# Patient Record
Sex: Female | Born: 1950 | Race: White | Hispanic: No | Smoking: Former smoker
Health system: Southern US, Community
[De-identification: ages and names within clinical notes are randomized; demographics above are authoritative.]

## PROBLEM LIST (undated history)

## (undated) DIAGNOSIS — E079 Disorder of thyroid, unspecified: Secondary | ICD-10-CM

## (undated) DIAGNOSIS — F329 Major depressive disorder, single episode, unspecified: Secondary | ICD-10-CM

## (undated) DIAGNOSIS — I10 Essential (primary) hypertension: Secondary | ICD-10-CM

## (undated) DIAGNOSIS — K219 Gastro-esophageal reflux disease without esophagitis: Secondary | ICD-10-CM

## (undated) DIAGNOSIS — E119 Type 2 diabetes mellitus without complications: Secondary | ICD-10-CM

## (undated) DIAGNOSIS — T7840XA Allergy, unspecified, initial encounter: Secondary | ICD-10-CM

## (undated) DIAGNOSIS — C801 Malignant (primary) neoplasm, unspecified: Secondary | ICD-10-CM

## (undated) DIAGNOSIS — C50919 Malignant neoplasm of unspecified site of unspecified female breast: Secondary | ICD-10-CM

## (undated) DIAGNOSIS — M1990 Unspecified osteoarthritis, unspecified site: Secondary | ICD-10-CM

## (undated) DIAGNOSIS — M810 Age-related osteoporosis without current pathological fracture: Secondary | ICD-10-CM

## (undated) DIAGNOSIS — Z8619 Personal history of other infectious and parasitic diseases: Secondary | ICD-10-CM

## (undated) DIAGNOSIS — E785 Hyperlipidemia, unspecified: Secondary | ICD-10-CM

## (undated) DIAGNOSIS — F32A Depression, unspecified: Secondary | ICD-10-CM

## (undated) DIAGNOSIS — N390 Urinary tract infection, site not specified: Secondary | ICD-10-CM

## (undated) DIAGNOSIS — I447 Left bundle-branch block, unspecified: Secondary | ICD-10-CM

## (undated) DIAGNOSIS — N189 Chronic kidney disease, unspecified: Secondary | ICD-10-CM

## (undated) HISTORY — DX: Disorder of thyroid, unspecified: E07.9

## (undated) HISTORY — DX: Type 2 diabetes mellitus without complications: E11.9

## (undated) HISTORY — DX: Major depressive disorder, single episode, unspecified: F32.9

## (undated) HISTORY — DX: Essential (primary) hypertension: I10

## (undated) HISTORY — PX: APPENDECTOMY: SHX54

## (undated) HISTORY — DX: Gastro-esophageal reflux disease without esophagitis: K21.9

## (undated) HISTORY — DX: Personal history of other infectious and parasitic diseases: Z86.19

## (undated) HISTORY — DX: Hyperlipidemia, unspecified: E78.5

## (undated) HISTORY — DX: Unspecified osteoarthritis, unspecified site: M19.90

## (undated) HISTORY — DX: Left bundle-branch block, unspecified: I44.7

## (undated) HISTORY — DX: Allergy, unspecified, initial encounter: T78.40XA

## (undated) HISTORY — DX: Depression, unspecified: F32.A

## (undated) HISTORY — PX: BREAST BIOPSY: SHX20

## (undated) HISTORY — DX: Urinary tract infection, site not specified: N39.0

## (undated) HISTORY — PX: ABDOMINAL HYSTERECTOMY: SHX81

---

## 2014-11-01 DIAGNOSIS — Z23 Encounter for immunization: Secondary | ICD-10-CM | POA: Diagnosis not present

## 2014-12-06 DIAGNOSIS — E119 Type 2 diabetes mellitus without complications: Secondary | ICD-10-CM | POA: Diagnosis not present

## 2014-12-06 DIAGNOSIS — N289 Disorder of kidney and ureter, unspecified: Secondary | ICD-10-CM | POA: Diagnosis not present

## 2014-12-06 DIAGNOSIS — E039 Hypothyroidism, unspecified: Secondary | ICD-10-CM | POA: Diagnosis not present

## 2014-12-08 DIAGNOSIS — R5383 Other fatigue: Secondary | ICD-10-CM | POA: Diagnosis not present

## 2014-12-08 DIAGNOSIS — E119 Type 2 diabetes mellitus without complications: Secondary | ICD-10-CM | POA: Diagnosis not present

## 2015-03-08 DIAGNOSIS — E039 Hypothyroidism, unspecified: Secondary | ICD-10-CM | POA: Diagnosis not present

## 2015-03-08 DIAGNOSIS — R5383 Other fatigue: Secondary | ICD-10-CM | POA: Diagnosis not present

## 2015-03-08 DIAGNOSIS — I10 Essential (primary) hypertension: Secondary | ICD-10-CM | POA: Diagnosis not present

## 2015-03-08 DIAGNOSIS — E119 Type 2 diabetes mellitus without complications: Secondary | ICD-10-CM | POA: Diagnosis not present

## 2015-03-08 DIAGNOSIS — E785 Hyperlipidemia, unspecified: Secondary | ICD-10-CM | POA: Diagnosis not present

## 2015-03-10 DIAGNOSIS — I10 Essential (primary) hypertension: Secondary | ICD-10-CM | POA: Diagnosis not present

## 2015-03-10 DIAGNOSIS — E119 Type 2 diabetes mellitus without complications: Secondary | ICD-10-CM | POA: Diagnosis not present

## 2015-03-10 DIAGNOSIS — Z1231 Encounter for screening mammogram for malignant neoplasm of breast: Secondary | ICD-10-CM | POA: Diagnosis not present

## 2015-03-11 DIAGNOSIS — N289 Disorder of kidney and ureter, unspecified: Secondary | ICD-10-CM | POA: Diagnosis not present

## 2015-03-11 DIAGNOSIS — M545 Low back pain: Secondary | ICD-10-CM | POA: Diagnosis not present

## 2015-03-24 DIAGNOSIS — D128 Benign neoplasm of rectum: Secondary | ICD-10-CM | POA: Diagnosis not present

## 2015-03-24 DIAGNOSIS — K635 Polyp of colon: Secondary | ICD-10-CM | POA: Diagnosis not present

## 2015-03-24 DIAGNOSIS — K621 Rectal polyp: Secondary | ICD-10-CM | POA: Diagnosis not present

## 2015-03-24 DIAGNOSIS — D123 Benign neoplasm of transverse colon: Secondary | ICD-10-CM | POA: Diagnosis not present

## 2015-06-09 DIAGNOSIS — E039 Hypothyroidism, unspecified: Secondary | ICD-10-CM | POA: Diagnosis not present

## 2015-06-09 DIAGNOSIS — E119 Type 2 diabetes mellitus without complications: Secondary | ICD-10-CM | POA: Diagnosis not present

## 2015-06-09 DIAGNOSIS — N289 Disorder of kidney and ureter, unspecified: Secondary | ICD-10-CM | POA: Diagnosis not present

## 2015-06-10 DIAGNOSIS — I10 Essential (primary) hypertension: Secondary | ICD-10-CM | POA: Diagnosis not present

## 2015-09-17 DIAGNOSIS — Z23 Encounter for immunization: Secondary | ICD-10-CM | POA: Diagnosis not present

## 2016-01-12 ENCOUNTER — Encounter: Payer: Self-pay | Admitting: Family Medicine

## 2016-01-12 ENCOUNTER — Ambulatory Visit (INDEPENDENT_AMBULATORY_CARE_PROVIDER_SITE_OTHER): Payer: Medicare Other | Admitting: Family Medicine

## 2016-01-12 ENCOUNTER — Other Ambulatory Visit: Payer: Self-pay | Admitting: *Deleted

## 2016-01-12 VITALS — BP 130/70 | HR 86 | Temp 98.8°F | Ht 62.5 in | Wt 151.0 lb

## 2016-01-12 DIAGNOSIS — E1169 Type 2 diabetes mellitus with other specified complication: Secondary | ICD-10-CM | POA: Insufficient documentation

## 2016-01-12 DIAGNOSIS — I10 Essential (primary) hypertension: Secondary | ICD-10-CM | POA: Diagnosis not present

## 2016-01-12 DIAGNOSIS — K219 Gastro-esophageal reflux disease without esophagitis: Secondary | ICD-10-CM | POA: Insufficient documentation

## 2016-01-12 DIAGNOSIS — G4700 Insomnia, unspecified: Secondary | ICD-10-CM

## 2016-01-12 DIAGNOSIS — R159 Full incontinence of feces: Secondary | ICD-10-CM | POA: Insufficient documentation

## 2016-01-12 DIAGNOSIS — E119 Type 2 diabetes mellitus without complications: Secondary | ICD-10-CM | POA: Diagnosis not present

## 2016-01-12 DIAGNOSIS — F321 Major depressive disorder, single episode, moderate: Secondary | ICD-10-CM

## 2016-01-12 DIAGNOSIS — E039 Hypothyroidism, unspecified: Secondary | ICD-10-CM | POA: Diagnosis not present

## 2016-01-12 DIAGNOSIS — I152 Hypertension secondary to endocrine disorders: Secondary | ICD-10-CM | POA: Insufficient documentation

## 2016-01-12 DIAGNOSIS — E1121 Type 2 diabetes mellitus with diabetic nephropathy: Secondary | ICD-10-CM | POA: Insufficient documentation

## 2016-01-12 DIAGNOSIS — J309 Allergic rhinitis, unspecified: Secondary | ICD-10-CM | POA: Insufficient documentation

## 2016-01-12 DIAGNOSIS — E785 Hyperlipidemia, unspecified: Secondary | ICD-10-CM | POA: Insufficient documentation

## 2016-01-12 DIAGNOSIS — F5104 Psychophysiologic insomnia: Secondary | ICD-10-CM

## 2016-01-12 DIAGNOSIS — E7800 Pure hypercholesterolemia, unspecified: Secondary | ICD-10-CM

## 2016-01-12 DIAGNOSIS — E1159 Type 2 diabetes mellitus with other circulatory complications: Secondary | ICD-10-CM | POA: Insufficient documentation

## 2016-01-12 LAB — HM DIABETES FOOT EXAM

## 2016-01-12 NOTE — Assessment & Plan Note (Signed)
Well controlled on 1/2 tab of trazodone at bedtime.

## 2016-01-12 NOTE — Patient Instructions (Addendum)
Make sure you have a yearly eye exam.  Return for fasting labs in next few week.  Start benefiber daily, increase slowly.  Avoid greasy foods.  Increase water in diet. Can start back on probiotic.  When back from trip. Try 1 week off metformin if issue not resolved. If stool issue better call for medicaiton change. Restart metformin if not any better.

## 2016-01-12 NOTE — Assessment & Plan Note (Signed)
Stable control on flonase and zyrtec.

## 2016-01-12 NOTE — Assessment & Plan Note (Signed)
Stable control on current levo dose.

## 2016-01-12 NOTE — Assessment & Plan Note (Signed)
Stable with omeprazole 20 mg daily. Can try to wean off.

## 2016-01-12 NOTE — Progress Notes (Signed)
Subjective:    Patient ID: April Ross, female    DOB: [birth year], 65 y.o.   MRN: [id number]  HPI   65 year old female presents to establish care.  She recently moved from [facility]. Dr. Janalyn Rouse. Last OV  06/2015.  Last CPX 03/2015  She has been having sudden urges to defecate ongoing x 1-1.5 years, worse in last several months. Occ diarrhea, but softer usually.  Almost incontinence. A lot of bloating and gas, no blood in stool.   Diabetes: Due for recheck. Well controlled last check A1C 6.4 06/2015,  On metformin in last year. Using medications without difficulties: Hypoglycemic episodes:3-4 times in last year. Hyperglycemic episodes:none Feet problems:no uclers Blood Sugars averaging:occ checking, FBS 117-135 eye exam within last year: due   Elevated Cholesterol: Due for re-eval on crestor. Using medications without problems:None Muscle aches: None Diet compliance: moderate Exercise:none Other complaints:  Hypothyroid: Well controlled on levothyroxine.Last TSH 0.11 in 06/2015   Hypertension:    Well controlled on benazapril,  BP Readings from Last 3 Encounters:  01/12/16 130/70  Using medication without problems or lightheadedness: None Chest pain with exertion:None Edema:None Short of breath:None Average home BPs:120/70 Other issues:  Allergic rhinitis: Stale control on flonase and zyrtec.  Moderate depression, stable control. Fluoxetine and trazodone at night. Still some stressors. Husband  passed away with  Esophageal cancer 2008, trying to sell house.  Will be due for Medicare wellness in 03/2015 Colonoscpy 03/2015, repeta due in 10 years Mammo nml 03/2015  TAH, no pap, no DVE required.   Social History /Family History/Past Medical History reviewed and updated if needed. Review of Systems  Constitutional: Negative for fever and fatigue.  HENT: Negative for congestion and ear pain.   Eyes: Negative for pain.  Respiratory: Negative for cough,  chest tightness and shortness of breath.   Cardiovascular: Negative for chest pain, palpitations and leg swelling.  Gastrointestinal: Positive for nausea and diarrhea. Negative for abdominal pain and blood in stool.  Genitourinary: Negative for dysuria and vaginal bleeding.  Musculoskeletal: Positive for back pain.  Neurological: Negative for syncope, light-headedness and headaches.  Psychiatric/Behavioral: Negative for dysphoric mood.       Objective:   Physical Exam  Constitutional: Vital signs are normal. She appears well-developed and well-nourished. She is cooperative.  Non-toxic appearance. She does not appear ill. No distress.  HENT:  Head: Normocephalic.  Right Ear: Hearing, tympanic membrane, external ear and ear canal normal.  Left Ear: Hearing, tympanic membrane, external ear and ear canal normal.  Nose: Nose normal.  Eyes: Conjunctivae, EOM and lids are normal. Pupils are equal, round, and reactive to light. Lids are everted and swept, no foreign bodies found.  Neck: Trachea normal and normal range of motion. Neck supple. Carotid bruit is not present. No thyroid mass and no thyromegaly present.  Cardiovascular: Normal rate, regular rhythm, S1 normal, S2 normal, normal heart sounds and intact distal pulses.  Exam reveals no gallop.   No murmur heard. Pulmonary/Chest: Effort normal and breath sounds normal. No respiratory distress. She has no wheezes. She has no rhonchi. She has no rales.  Abdominal: Soft. Normal appearance and bowel sounds are normal. She exhibits no distension, no fluid wave, no abdominal bruit and no mass. There is no hepatosplenomegaly. There is no tenderness. There is no rebound, no guarding and no CVA tenderness. No hernia.  Lymphadenopathy:    She has no cervical adenopathy.    She has no axillary adenopathy.  Neurological:  She is alert. She has normal strength. No cranial nerve deficit or sensory deficit.  Skin: Skin is warm, dry and intact. No Flinchum  noted.  Psychiatric: Her speech is normal and behavior is normal. Judgment normal. Her mood appears not anxious. Cognition and memory are normal. She does not exhibit a depressed mood.      Diabetic foot exam: Normal inspection No skin breakdown No calluses  Normal DP pulses Normal sensation to light touch and monofilament Nails normal     Assessment & Plan:

## 2016-01-12 NOTE — Assessment & Plan Note (Signed)
Stable control on fluoxetine 

## 2016-01-12 NOTE — Assessment & Plan Note (Signed)
Due for re-eval. 

## 2016-01-12 NOTE — Progress Notes (Signed)
Pre visit review using our clinic review tool, if applicable. No additional management support is needed unless otherwise documented below in the visit note. 

## 2016-01-12 NOTE — Assessment & Plan Note (Signed)
Due for re-eval. ? If metformin causing diarrhea.

## 2016-01-12 NOTE — Assessment & Plan Note (Signed)
Well controlled. Continue current medication.  

## 2016-01-12 NOTE — Assessment & Plan Note (Signed)
IBS versus SE to metformin.  Trial off. Try stress reduction, avoid greasy foods, increase fiber and add probiotic.

## 2016-01-26 ENCOUNTER — Other Ambulatory Visit (INDEPENDENT_AMBULATORY_CARE_PROVIDER_SITE_OTHER): Payer: Medicare Other

## 2016-01-26 ENCOUNTER — Telehealth: Payer: Self-pay | Admitting: Family Medicine

## 2016-01-26 DIAGNOSIS — E119 Type 2 diabetes mellitus without complications: Secondary | ICD-10-CM

## 2016-01-26 DIAGNOSIS — E7800 Pure hypercholesterolemia, unspecified: Secondary | ICD-10-CM | POA: Diagnosis not present

## 2016-01-26 DIAGNOSIS — E039 Hypothyroidism, unspecified: Secondary | ICD-10-CM

## 2016-01-26 LAB — LIPID PANEL
CHOLESTEROL: 215 mg/dL — AB (ref 0–200)
HDL: 34.1 mg/dL — AB (ref >39.00)
NonHDL: 180.48
Total CHOL/HDL Ratio: 6
Triglycerides: 316 mg/dL — ABNORMAL HIGH (ref 0.0–149.0)
VLDL: 63.2 mg/dL — ABNORMAL HIGH (ref 0.0–40.0)

## 2016-01-26 LAB — COMPREHENSIVE METABOLIC PANEL
ALBUMIN: 4.7 g/dL (ref 3.5–5.2)
ALK PHOS: 68 U/L (ref 39–117)
ALT: 13 U/L (ref 0–35)
AST: 16 U/L (ref 0–37)
BILIRUBIN TOTAL: 0.5 mg/dL (ref 0.2–1.2)
BUN: 22 mg/dL (ref 6–23)
CO2: 28 mEq/L (ref 19–32)
Calcium: 10.1 mg/dL (ref 8.4–10.5)
Chloride: 102 mEq/L (ref 96–112)
Creatinine, Ser: 1.5 mg/dL — ABNORMAL HIGH (ref 0.40–1.20)
GFR: 36.82 mL/min — AB (ref >60.00)
GLUCOSE: 139 mg/dL — AB (ref 70–99)
Potassium: 4.6 mEq/L (ref 3.5–5.1)
Sodium: 138 mEq/L (ref 135–145)
TOTAL PROTEIN: 7.3 g/dL (ref 6.0–8.3)

## 2016-01-26 LAB — HEMOGLOBIN A1C: HEMOGLOBIN A1C: 6.6 % — AB (ref 4.6–6.5)

## 2016-01-26 LAB — LDL CHOLESTEROL, DIRECT: LDL DIRECT: 143 mg/dL

## 2016-01-26 NOTE — Telephone Encounter (Signed)
-----   Message from Ellamae Sia sent at 01/26/2016  9:03 AM EDT ----- Regarding: Lab orders asap, Thanks Lab orders, no f/u appt

## 2016-01-27 ENCOUNTER — Other Ambulatory Visit: Payer: Self-pay

## 2016-01-27 MED ORDER — FLUOXETINE HCL 20 MG PO CAPS: 20.0000 mg | ORAL_CAPSULE | Freq: Every day | ORAL | Status: DC

## 2016-01-27 MED ORDER — OMEPRAZOLE 20 MG PO CPDR
20.0000 mg | DELAYED_RELEASE_CAPSULE | Freq: Every day | ORAL | Status: DC
Start: 2016-01-27 — End: 2016-05-23

## 2016-01-27 MED ORDER — BENAZEPRIL HCL 20 MG PO TABS: 20.0000 mg | ORAL_TABLET | Freq: Every day | ORAL | Status: DC

## 2016-01-27 NOTE — Telephone Encounter (Signed)
Pt left note requesting refill benazepril,fluoxetine,omeprazole, crestor and tramadol to [facility]. Pt established care on 01/12/16; pt scheduled for medicare wellness with Dr Diona Browner on 05/08/16. Lipid labs are in pts chart. Benazepril, fluoxetine and omeprazole refilled per protocol. Crestor(time for lipid to be eval per 01/12/16 note) and tramadol refill request sent to Dr Diona Browner.

## 2016-01-28 MED ORDER — ROSUVASTATIN CALCIUM 10 MG PO TABS: 10.0000 mg | ORAL_TABLET | Freq: Every day | ORAL | Status: DC

## 2016-01-28 MED ORDER — TRAMADOL HCL 50 MG PO TABS: 50.0000 mg | ORAL_TABLET | ORAL | Status: DC | PRN

## 2016-01-31 ENCOUNTER — Telehealth: Payer: Self-pay | Admitting: Family Medicine

## 2016-01-31 MED ORDER — ROSUVASTATIN CALCIUM 20 MG PO TABS: 20.0000 mg | ORAL_TABLET | Freq: Every day | ORAL | Status: DC

## 2016-01-31 NOTE — Telephone Encounter (Signed)
Lab results discussed with April Ross via telephone. (See result note from 01/26/16 labs).  She is willing to increase her Crestor to 20 mg daily.  [facility] [place]  She states the week prior to having her labs drawn they had been at [facility] doing a lot of walking and don't drinking a lot of water so she was probably a little dehydrated

## 2016-01-31 NOTE — Telephone Encounter (Signed)
Tramadol called into [facility].

## 2016-01-31 NOTE — Telephone Encounter (Signed)
Patient returned Donna's phone call.  Patient's out of town. Call her back on her cell phone.

## 2016-02-02 ENCOUNTER — Other Ambulatory Visit: Payer: Self-pay | Admitting: *Deleted

## 2016-02-02 NOTE — Telephone Encounter (Signed)
Last office visit 01/12/2016.  Ok to refill?

## 2016-02-03 MED ORDER — TRAZODONE HCL 100 MG PO TABS: 100.0000 mg | ORAL_TABLET | Freq: Every day | ORAL | Status: DC

## 2016-02-17 ENCOUNTER — Telehealth: Payer: Self-pay | Admitting: Family Medicine

## 2016-02-17 DIAGNOSIS — L282 Other prurigo: Secondary | ICD-10-CM | POA: Diagnosis not present

## 2016-02-17 DIAGNOSIS — M79651 Pain in right thigh: Secondary | ICD-10-CM | POA: Diagnosis not present

## 2016-02-17 NOTE — Telephone Encounter (Signed)
I spoke with pt and she is on her way to [facility] in [place] now. Pt will cb next week with update.

## 2016-02-17 NOTE — Telephone Encounter (Signed)
PLEASE NOTE: All timestamps contained within this report are represented as [place] Standard Time. CONFIDENTIALTY NOTICE: This fax transmission is intended only for the addressee. It contains information that is legally privileged, confidential or otherwise protected from use or disclosure. If you are not the intended recipient, you are strictly prohibited from reviewing, disclosing, copying using or disseminating any of this information or taking any action in reliance on or regarding this information. If you have received this fax in error, please notify us immediately by telephone so that we can arrange for its return to [place]. Phone: [phone], Toll-Free: [phone], Fax: [phone] Page: 1 of 2 Call Id: [id number] [place] Patient Name: April Ross Gender: Female DOB: Jun 29, 1951 Age: 65 Y 11 M 24 D Return Phone Number: [phone] (Primary), [phone] (Secondary) Address: City/State/Zip: [place] Client [place] [facility] - Client Client Site [place] - Day Physician Eliezer Lofts - MD Contact Type Call Who Is Calling Patient / Member / Family / Caregiver Call Type Triage / Clinical Relationship To Patient Self Return Phone Number [phone] (Primary) Chief Complaint Tick Bite Reason for Call Symptomatic / Request for [name] states she found a tick on her upper thigh Wednesday morning. Yesterday it became red and today is red, swollen and redness is spreading. Appointment Disposition EMR Appointment Attempted - Not Scheduled Info pasted into Epic Yes PreDisposition Call Doctor Translation No Nurse Assessment Nurse: Luther Parody, RN, Malachy Mood Date/Time (Eastern Time): 02/17/2016 3:12:27 PM Confirm and document reason for call. If symptomatic, describe symptoms. You must click the next button to save  text entered. ---Caller states that she found a tick on her upper thigh on Wed. She removed it w/o any problems and said it came off very easily however today she noticed spreading redness with swelling and warmth to the area. Has the patient traveled out of the country within the last 30 days? ---Not Applicable Does the patient have any new or worsening symptoms? ---Yes Will a triage be completed? ---Yes Related visit to physician within the last 2 weeks? ---N/A Does the PT have any chronic conditions? (i.e. diabetes, asthma, etc.) ---Unknown Is this a behavioral health or substance abuse call? ---No Guidelines Guideline Title Affirmed Question Affirmed Notes Nurse Date/Time (Eastern Time) Tick Bite [1] Red streak or red line AND [2] length > 2 inches (5 cm) Luther Parody, RN, Cheryl 02/17/2016 3:14:37 PM Disp. Time Eilene Ghazi Time) Disposition Final User PLEASE NOTE: All timestamps contained within this report are represented as [place] Standard Time. CONFIDENTIALTY NOTICE: This fax transmission is intended only for the addressee. It contains information that is legally privileged, confidential or otherwise protected from use or disclosure. If you are not the intended recipient, you are strictly prohibited from reviewing, disclosing, copying using or disseminating any of this information or taking any action in reliance on or regarding this information. If you have received this fax in error, please notify us immediately by telephone so that we can arrange for its return to [place]. Phone: [phone], Toll-Free: [phone], Fax: [phone] Page: 2 of 2 Call Id: [id number] 02/17/2016 3:20:12 PM See Physician within 4 Hours (or PCP triage) Yes Luther Parody, RN, Erskine Speed Understands: Yes Disagree/Comply: Comply Care Advice Given Per Guideline SEE PHYSICIAN WITHIN 4 HOURS (or PCP triage): * You become worse. CALL BACK IF: CARE ADVICE given per Tick Bites (Adult) guideline. Referrals GO TO  FACILITY OTHER - SPECIFY

## 2016-02-17 NOTE — Telephone Encounter (Signed)
[facility]     Patient Name: April Ross Initial Comment Caller states she found a tick on her upper thigh Wednesday morning. Yesterday it became red and today is red, swollen and redness is spreading.   DOB: Jun 01, 1951      Nurse Assessment  Nurse: Luther Parody RN, Malachy Mood Date/Time (Eastern Time): 02/17/2016 3:12:27 PM  Confirm and document reason for call. If symptomatic, describe symptoms. You must click the next button to save text entered. ---Caller states that she found a tick on her upper thigh on Wed. She removed it w/o any problems and said it came off very easily however today she noticed spreading redness with swelling and warmth to the area.  Has the patient traveled out of the country within the last 30 days? ---Not Applicable  Does the patient have any new or worsening symptoms? ---Yes  Will a triage be completed? ---Yes  Related visit to physician within the last 2 weeks? ---N/A  Does the PT have any chronic conditions? (i.e. diabetes, asthma, etc.) ---Unknown  Is this a behavioral health or substance abuse call? ---No    Guidelines     Guideline Title Affirmed Question Affirmed Notes   Tick Bite [1] Red streak or red line AND [2] length > 2 inches (5 cm)    Final Disposition User   See Physician within 4 Hours (or PCP triage) Luther Parody, RN, Cheryl     Referrals   GO TO FACILITY OTHER - SPECIFY   Disagree/Comply: Comply

## 2016-04-25 DIAGNOSIS — E119 Type 2 diabetes mellitus without complications: Secondary | ICD-10-CM | POA: Diagnosis not present

## 2016-04-25 LAB — HM DIABETES EYE EXAM

## 2016-05-01 ENCOUNTER — Encounter: Payer: Self-pay | Admitting: Family Medicine

## 2016-05-03 ENCOUNTER — Telehealth: Payer: Self-pay | Admitting: Family Medicine

## 2016-05-03 ENCOUNTER — Other Ambulatory Visit (INDEPENDENT_AMBULATORY_CARE_PROVIDER_SITE_OTHER): Payer: Medicare Other

## 2016-05-03 DIAGNOSIS — Z1159 Encounter for screening for other viral diseases: Secondary | ICD-10-CM

## 2016-05-03 DIAGNOSIS — E119 Type 2 diabetes mellitus without complications: Secondary | ICD-10-CM | POA: Diagnosis not present

## 2016-05-03 DIAGNOSIS — E039 Hypothyroidism, unspecified: Secondary | ICD-10-CM

## 2016-05-03 DIAGNOSIS — E7800 Pure hypercholesterolemia, unspecified: Secondary | ICD-10-CM | POA: Diagnosis not present

## 2016-05-03 LAB — COMPREHENSIVE METABOLIC PANEL
ALBUMIN: 4.8 g/dL (ref 3.5–5.2)
ALK PHOS: 63 U/L (ref 39–117)
ALT: 14 U/L (ref 0–35)
AST: 17 U/L (ref 0–37)
BILIRUBIN TOTAL: 0.5 mg/dL (ref 0.2–1.2)
BUN: 31 mg/dL — AB (ref 6–23)
CO2: 27 mEq/L (ref 19–32)
CREATININE: 1.5 mg/dL — AB (ref 0.40–1.20)
Calcium: 10.5 mg/dL (ref 8.4–10.5)
Chloride: 99 mEq/L (ref 96–112)
GFR: 36.79 mL/min — ABNORMAL LOW (ref >60.00)
GLUCOSE: 135 mg/dL — AB (ref 70–99)
Potassium: 4.2 mEq/L (ref 3.5–5.1)
SODIUM: 135 meq/L (ref 135–145)
TOTAL PROTEIN: 7.5 g/dL (ref 6.0–8.3)

## 2016-05-03 LAB — T3, FREE: T3 FREE: 3.2 pg/mL (ref 2.3–4.2)

## 2016-05-03 LAB — HEMOGLOBIN A1C: Hgb A1c MFr Bld: 6.6 % — ABNORMAL HIGH (ref 4.6–6.5)

## 2016-05-03 LAB — T4, FREE: FREE T4: 1.43 ng/dL (ref 0.60–1.60)

## 2016-05-03 LAB — LIPID PANEL
CHOL/HDL RATIO: 7
CHOLESTEROL: 205 mg/dL — AB (ref 0–200)
HDL: 30.2 mg/dL — ABNORMAL LOW (ref >39.00)
Triglycerides: 459 mg/dL — ABNORMAL HIGH (ref 0.0–149.0)

## 2016-05-03 LAB — LDL CHOLESTEROL, DIRECT: LDL DIRECT: 112 mg/dL

## 2016-05-03 LAB — TSH: TSH: 0.29 u[IU]/mL — ABNORMAL LOW (ref 0.35–4.50)

## 2016-05-03 NOTE — Telephone Encounter (Signed)
-----   Message from Marchia Bond sent at 05/03/2016  9:05 AM EDT ----- Regarding: Pt walked in for cpx labs this am, need orders. Thanks!! Please order  future cpx labs for pt's upcoming lab appt. Thanks Aniceto Boss

## 2016-05-04 LAB — HEPATITIS C ANTIBODY: HCV Ab: NEGATIVE

## 2016-05-08 ENCOUNTER — Ambulatory Visit (INDEPENDENT_AMBULATORY_CARE_PROVIDER_SITE_OTHER): Payer: Medicare Other | Admitting: Family Medicine

## 2016-05-08 ENCOUNTER — Encounter: Payer: Self-pay | Admitting: Family Medicine

## 2016-05-08 VITALS — BP 113/65 | HR 62 | Temp 98.4°F | Ht 62.5 in | Wt 148.5 lb

## 2016-05-08 DIAGNOSIS — E039 Hypothyroidism, unspecified: Secondary | ICD-10-CM

## 2016-05-08 DIAGNOSIS — R159 Full incontinence of feces: Secondary | ICD-10-CM

## 2016-05-08 DIAGNOSIS — N183 Chronic kidney disease, stage 3 unspecified: Secondary | ICD-10-CM | POA: Insufficient documentation

## 2016-05-08 DIAGNOSIS — Z0000 Encounter for general adult medical examination without abnormal findings: Secondary | ICD-10-CM | POA: Diagnosis not present

## 2016-05-08 DIAGNOSIS — E7800 Pure hypercholesterolemia, unspecified: Secondary | ICD-10-CM | POA: Diagnosis not present

## 2016-05-08 DIAGNOSIS — F321 Major depressive disorder, single episode, moderate: Secondary | ICD-10-CM

## 2016-05-08 DIAGNOSIS — Z23 Encounter for immunization: Secondary | ICD-10-CM | POA: Diagnosis not present

## 2016-05-08 DIAGNOSIS — Z1231 Encounter for screening mammogram for malignant neoplasm of breast: Secondary | ICD-10-CM

## 2016-05-08 DIAGNOSIS — E1121 Type 2 diabetes mellitus with diabetic nephropathy: Secondary | ICD-10-CM

## 2016-05-08 DIAGNOSIS — I10 Essential (primary) hypertension: Secondary | ICD-10-CM

## 2016-05-08 DIAGNOSIS — E2839 Other primary ovarian failure: Secondary | ICD-10-CM

## 2016-05-08 LAB — HM DIABETES FOOT EXAM

## 2016-05-08 MED ORDER — PNEUMOCOCCAL VAC POLYVALENT 25 MCG/0.5ML IJ INJ
0.5000 mL | INJECTION | Freq: Once | INTRAMUSCULAR | Status: AC
  Administered 2016-05-08: 0.5 mL via INTRAMUSCULAR

## 2016-05-08 NOTE — Assessment & Plan Note (Signed)
Well controlled. Continue current medication.  

## 2016-05-08 NOTE — Assessment & Plan Note (Signed)
Excellent control on metformin but metformin may be causing diarrhea. If not improving, consider changing to victoza, glipizide etc.

## 2016-05-08 NOTE — Assessment & Plan Note (Signed)
Improved, but not at goal. Work on low carb diet, continue crestor.If not at goal in 3 months, consider adding zetia.

## 2016-05-08 NOTE — Progress Notes (Signed)
Pre visit review using our clinic review tool, if applicable. No additional management support is needed unless otherwise documented below in the visit note. 

## 2016-05-08 NOTE — Assessment & Plan Note (Signed)
Most consistent with IBS.  No improvement with decreasing greasy foods and stress.  trial of water and fiber supplement, gradually increase.  if still no improvement.. Will try to replace metformin.

## 2016-05-08 NOTE — Assessment & Plan Note (Addendum)
Stable control. On ACEI.

## 2016-05-08 NOTE — Progress Notes (Signed)
I have personally reviewed the Medicare Annual Wellness questionnaire and have noted 1. The patient's medical and social history 2. Their use of alcohol, tobacco or illicit drugs 3. Their current medications and supplements 4. The patient's functional ability including ADL's, fall risks, home safety risks and hearing or visual             impairment. 5. Diet and physical activities 6. Evidence for depression or mood disorders 7.         Updated provider list Cognitive evaluation was performed and recorded on pt medicare questionnaire form. The patients weight, height, BMI and visual acuity have been recorded in the chart  I have made referrals, counseling and provided education to the patient based review of the above and I have provided the pt with a written personalized care plan for preventive services.   Continued  stool incontinence and  acute explosive diarrhea: Very watery , no blood. Occ abd cramping, after BM she feels better. ongoing x 1 year.  Has not improved with decrease greasy foods. No improvement with probiotic. Nml colonoscopy 2016.  Diabetes:  Well controlled on metformin.   On metformin in last year. Lab Results  Component Value Date   HGBA1C 6.6 (H) 05/03/2016  Using medications without difficulties: Hypoglycemic episodes:none Hyperglycemic episodes:none Feet problems:no uclers Blood Sugars averaging:occ checking, FBS 70-188 eye exam within last year: 7/17  She has been doing aggressive low carb diet.   Elevated Cholesterol:  Improved, LDL not at goal < 100 on crestor 20 mg daily Lab Results  Component Value Date   CHOL 205 (H) 05/03/2016   HDL 30.20 (L) 05/03/2016   LDLDIRECT 112.0 05/03/2016   TRIG (H) 05/03/2016    459.0 Triglyceride is over 400; calculations on Lipids are invalid.   CHOLHDL 7 05/03/2016  Using medications without problems:None Muscle aches: None Diet compliance: moderate Exercise:none Other complaints:  Hypothyroid: Well  controlled on levothyroxine.  Hypertension:              Well controlled on benazapril,  BP Readings from Last 3 Encounters:  05/08/16 113/65  01/12/16 130/70  Using medication without problems or lightheadedness: None Chest pain with exertion:None Edema:None Short of breath:None Average home BPs:120/70 Other issues:  Allergic rhinitis: Stale control on flonase and zyrtec.  Moderate depression, stable control. Fluoxetine and trazodone at night. Still some stressors. Husband  passed away with  Esophageal cancer 2008, trying to sell house.  CKD: stable   Social History /Family History/Past Medical History reviewed and updated if needed.  Review of Systems  Constitutional: Negative for fever and fatigue.  HENT: Negative for congestion and ear pain.   Eyes: Negative for pain.  Respiratory: Negative for cough, chest tightness and shortness of breath.   Cardiovascular: Negative for chest pain, palpitations and leg swelling.  Gastrointestinal: Positive for nausea and diarrhea. Negative for abdominal pain and blood in stool.  Genitourinary: Negative for dysuria and vaginal bleeding.  Musculoskeletal: Positive for back pain.  Neurological: Negative for syncope, light-headedness and headaches.  Psychiatric/Behavioral: Negative for dysphoric mood.       Objective:   Physical Exam  Constitutional: Vital signs are normal. She appears well-developed and well-nourished. She is cooperative.  Non-toxic appearance. She does not appear ill. No distress.  HENT:  Head: Normocephalic.  Right Ear: Hearing, tympanic membrane, external ear and ear canal normal.  Left Ear: Hearing, tympanic membrane, external ear and ear canal normal.  Nose: Nose normal.  Eyes: Conjunctivae, EOM and lids are normal.  Pupils are equal, round, and reactive to light. Lids are everted and swept, no foreign bodies found.  Neck: Trachea normal and normal range of motion. Neck supple. Carotid bruit is not present.  No thyroid mass and no thyromegaly present.  Cardiovascular: Normal rate, regular rhythm, S1 normal, S2 normal, normal heart sounds and intact distal pulses.  Exam reveals no gallop.   No murmur heard. Pulmonary/Chest: Effort normal and breath sounds normal. No respiratory distress. She has no wheezes. She has no rhonchi. She has no rales.  Abdominal: Soft. Normal appearance and bowel sounds are normal. She exhibits no distension, no fluid wave, no abdominal bruit and no mass. There is no hepatosplenomegaly. There is no tenderness. There is no rebound, no guarding and no CVA tenderness. No hernia.  Lymphadenopathy:    She has no cervical adenopathy.    She has no axillary adenopathy.  Neurological: She is alert. She has normal strength. No cranial nerve deficit or sensory deficit.  Skin: Skin is warm, dry and intact. No Ding noted.  Psychiatric: Her speech is normal and behavior is normal. Judgment normal. Her mood appears not anxious. Cognition and memory are normal. She does not exhibit a depressed mood.     Diabetic foot exam: Normal inspection No skin breakdown No calluses  Normal DP pulses Normal sensation to light touch and monofilament Nails normal  ASSESSMENT AND PLAN:  The patient's preventative maintenance and recommended screening tests for an annual wellness exam were reviewed in full today. Brought up to date unless services declined.  Counselled on the importance of diet, exercise, and its role in overall health and mortality. The patient's FH and SH was reviewed, including their home life, tobacco status, and drug and alcohol status.   Colonoscpy 03/2015, repeat due in 10 years Mammo nml 03/2015, due  TAH, no pap, no DVE required. Nonsmoker, Quit recently! 10 pack year history   Hep C: neg  DEXA: due  Vaccines: Tdap due

## 2016-05-08 NOTE — Assessment & Plan Note (Signed)
Stable control on current medications.

## 2016-05-08 NOTE — Patient Instructions (Addendum)
Can start metamucil for fiber supplement.  Call if diarrhea/IBS not imrpoving as expected.  Work on low cholesterol low carb diet.  Call to schedule mammogram on your own.  Get Tdap on own at pharmacy. Stop at front desk on way out for bone density.

## 2016-05-16 DIAGNOSIS — K1329 Other disturbances of oral epithelium, including tongue: Secondary | ICD-10-CM | POA: Diagnosis not present

## 2016-05-20 ENCOUNTER — Other Ambulatory Visit: Payer: Self-pay | Admitting: Family Medicine

## 2016-05-21 NOTE — Telephone Encounter (Signed)
Last filled 04-24-16 #30 Last OV 05-08-16 Next OV 11-09-16

## 2016-05-22 ENCOUNTER — Ambulatory Visit
Admission: RE | Admit: 2016-05-22 | Discharge: 2016-05-22 | Disposition: A | Discharge status: Home / Self Care | Payer: Medicare Other | Source: Ambulatory Visit | Attending: Family Medicine | Admitting: Family Medicine

## 2016-05-22 DIAGNOSIS — E2839 Other primary ovarian failure: Secondary | ICD-10-CM

## 2016-05-22 DIAGNOSIS — Z1231 Encounter for screening mammogram for malignant neoplasm of breast: Secondary | ICD-10-CM

## 2016-05-22 DIAGNOSIS — M810 Age-related osteoporosis without current pathological fracture: Secondary | ICD-10-CM | POA: Diagnosis not present

## 2016-05-22 DIAGNOSIS — Z780 Asymptomatic menopausal state: Secondary | ICD-10-CM | POA: Diagnosis not present

## 2016-05-22 IMAGING — MG 2D DIGITAL SCREENING BILATERAL MAMMOGRAM WITH CAD AND ADJUNCT TO
9 of 12 series · 9 of 28 positions shown · non-contrast
Comparison: None.

CLINICAL DATA: Screening.

EXAM:
2D DIGITAL SCREENING BILATERAL MAMMOGRAM WITH CAD AND ADJUNCT TOMO

[R MLO]
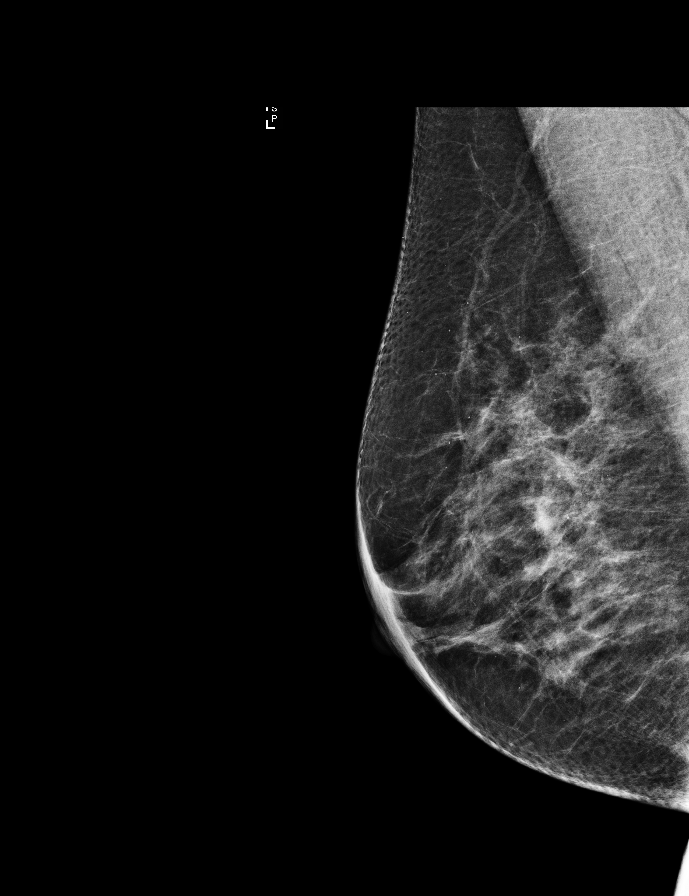

[L MLO]
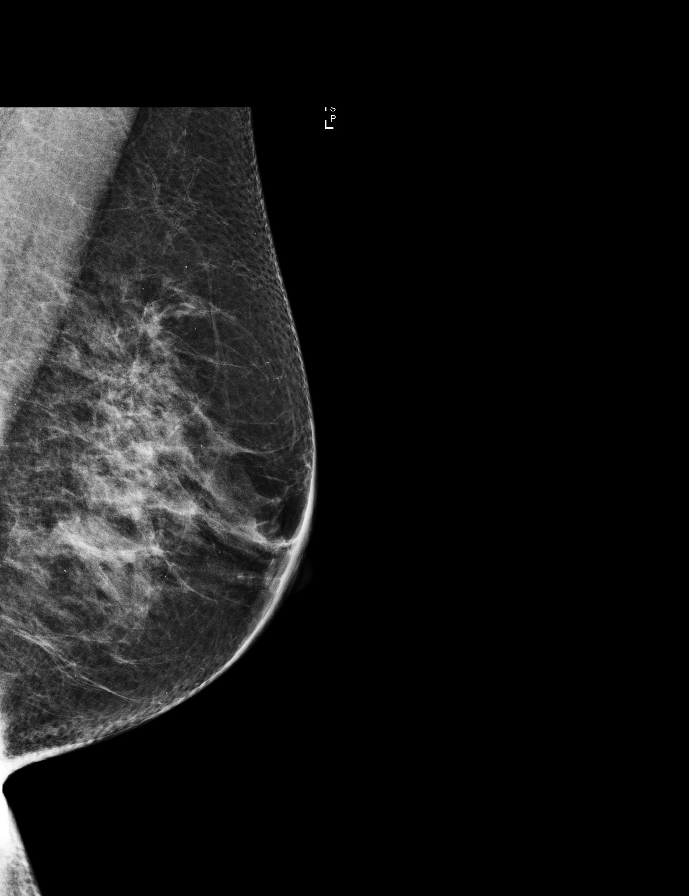

[R CC]
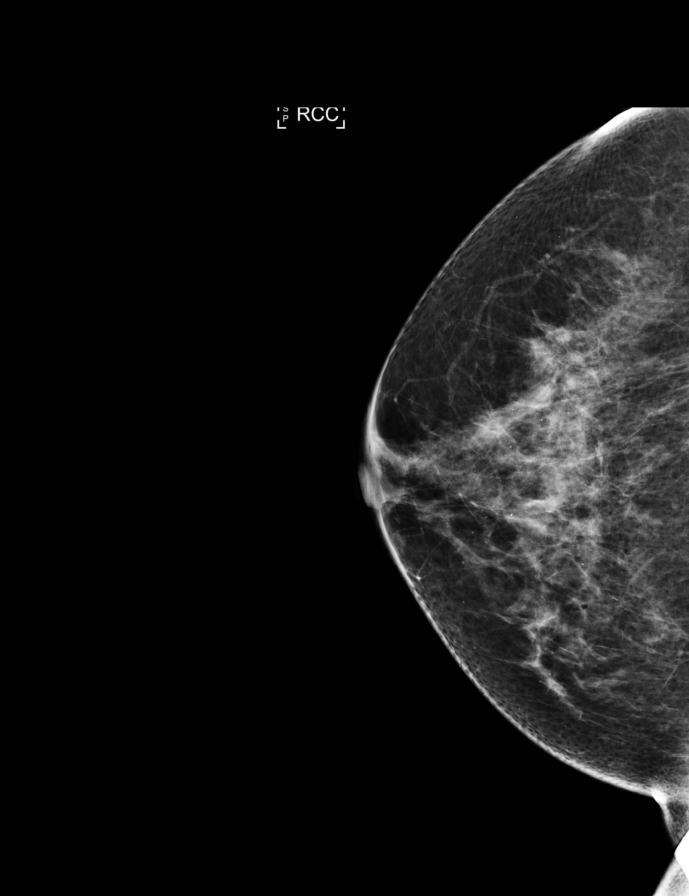

[L CC synth-2D]
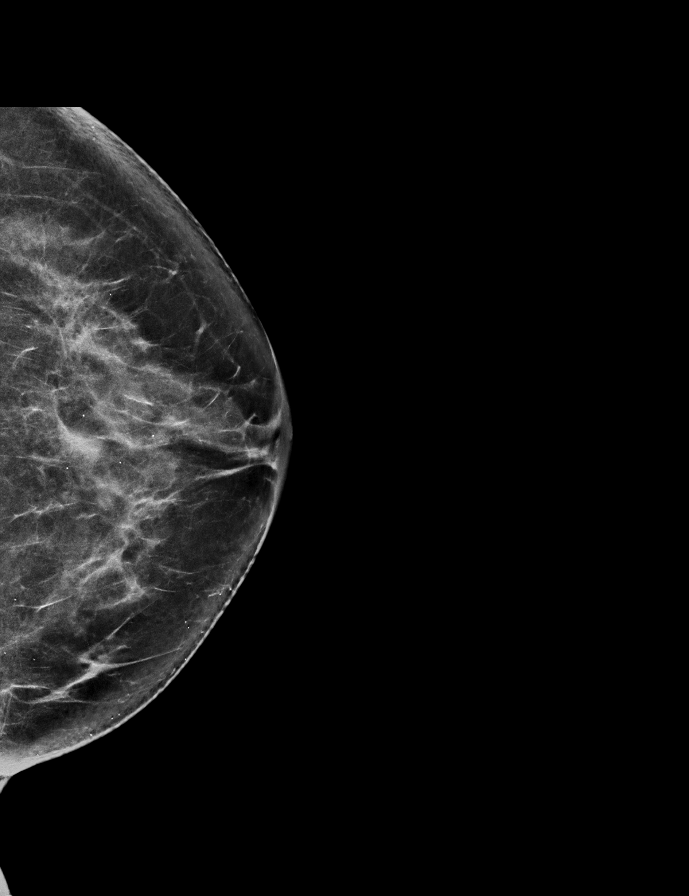

[R CC synth-2D]
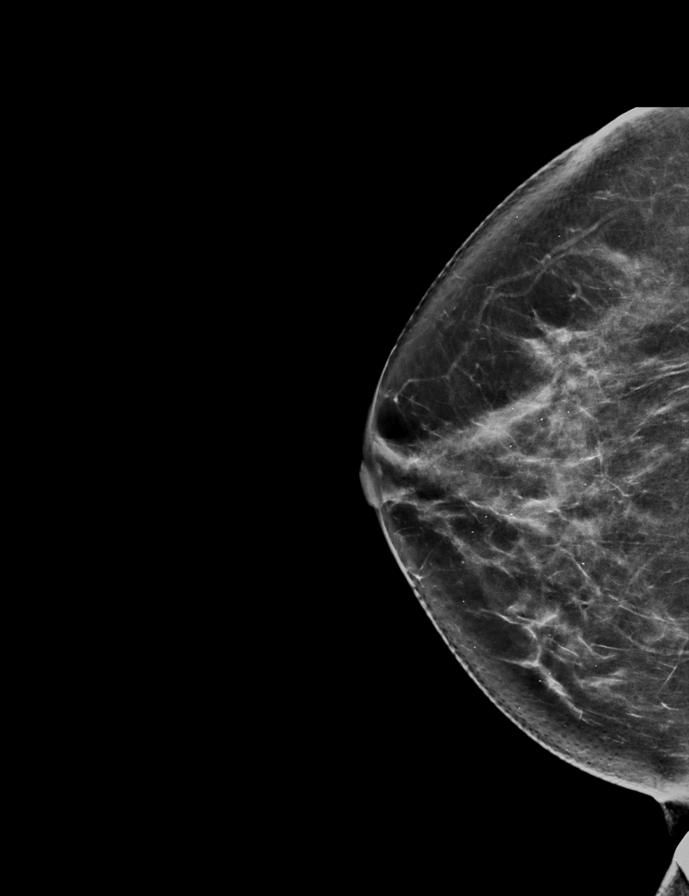

[L MLO synth-2D]
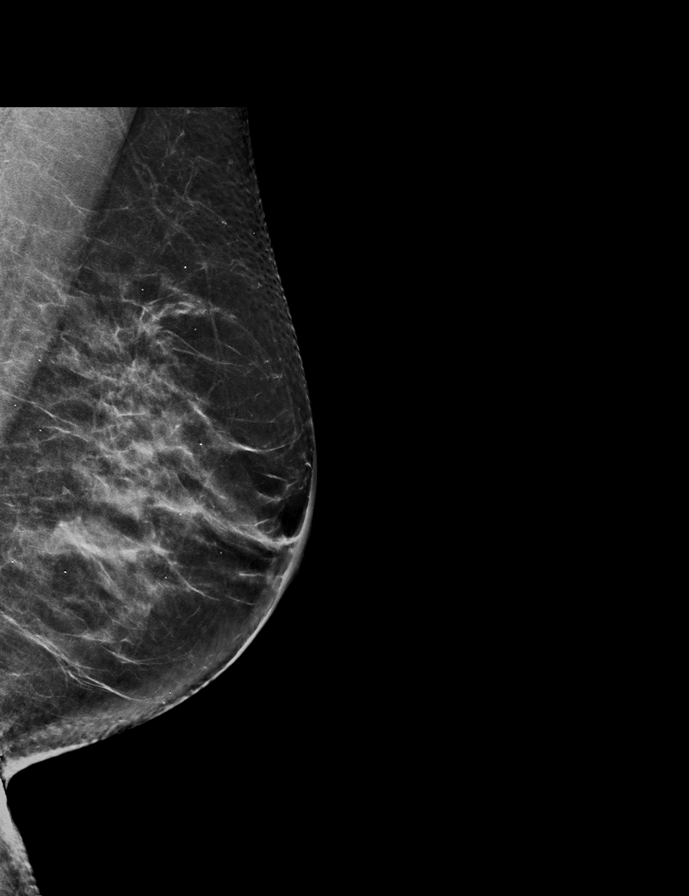

[L CC]
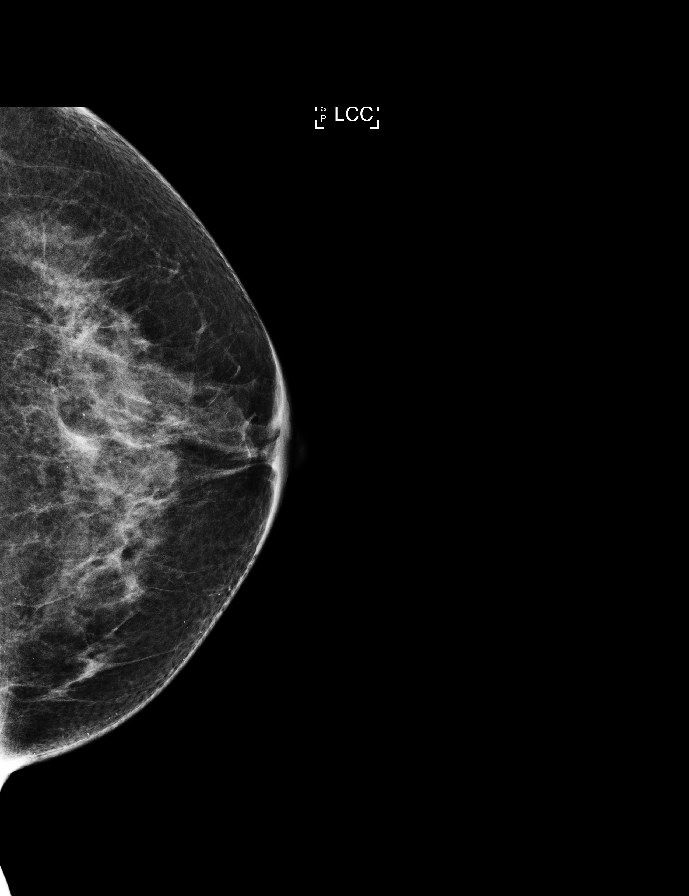

[R MLO synth-2D]
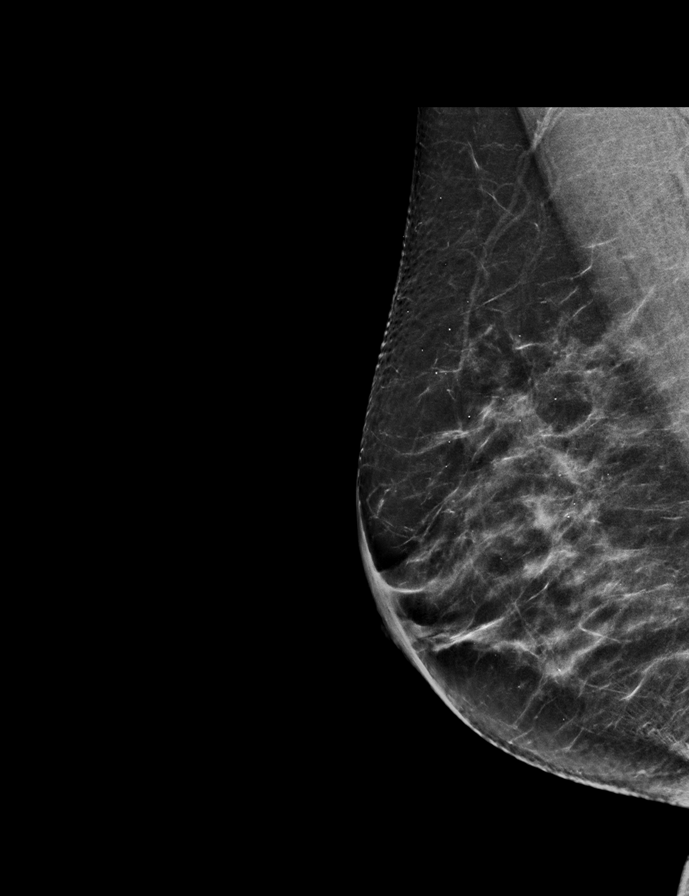

[R CC tomo · tomo slice 35/70.0]
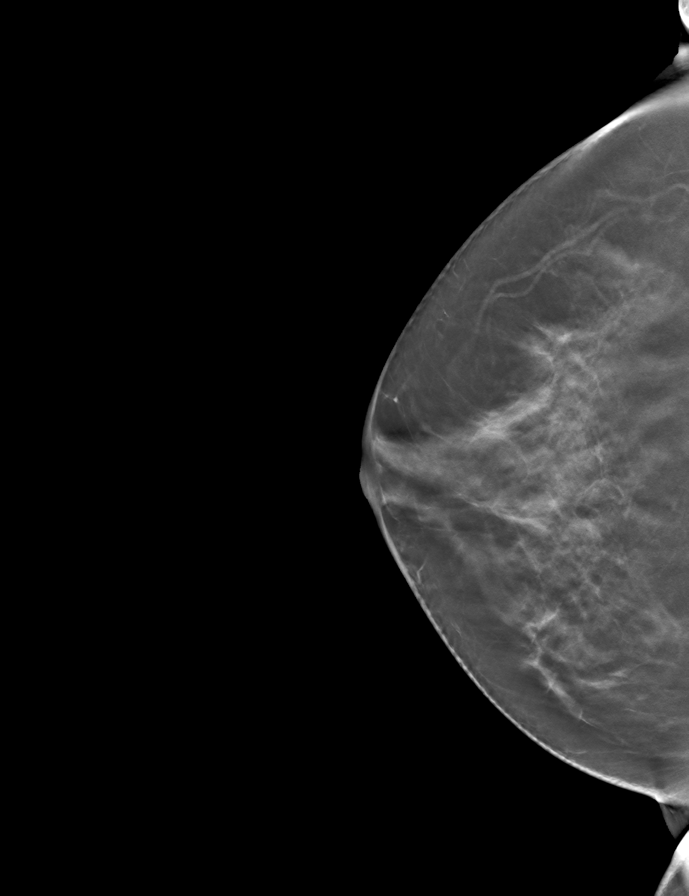

[9 of 28 positions shown; findings below may reference images not displayed]

ACR Breast Density Category c: The breast tissue is heterogeneously
dense, which may obscure small masses
FINDINGS: There are no findings suspicious for malignancy. Images were
processed with CAD.
IMPRESSION: No mammographic evidence of malignancy. A result letter of this
screening mammogram will be mailed directly to the patient.

RECOMMENDATION:
Screening mammogram in one year. (Code:[53])

BI-RADS CATEGORY  1: Negative.

## 2016-05-23 ENCOUNTER — Other Ambulatory Visit: Payer: Self-pay | Admitting: Family Medicine

## 2016-05-24 ENCOUNTER — Encounter: Payer: Self-pay | Admitting: Family Medicine

## 2016-05-24 DIAGNOSIS — M810 Age-related osteoporosis without current pathological fracture: Secondary | ICD-10-CM | POA: Insufficient documentation

## 2016-05-29 ENCOUNTER — Other Ambulatory Visit: Payer: Self-pay | Admitting: Family Medicine

## 2016-05-29 DIAGNOSIS — K1329 Other disturbances of oral epithelium, including tongue: Secondary | ICD-10-CM | POA: Diagnosis not present

## 2016-06-04 ENCOUNTER — Other Ambulatory Visit: Payer: Self-pay | Admitting: *Deleted

## 2016-06-04 MED ORDER — ROSUVASTATIN CALCIUM 20 MG PO TABS: 20.0000 mg | ORAL_TABLET | Freq: Every day | ORAL | 3 refills | Status: DC

## 2016-06-04 MED ORDER — FLUOXETINE HCL 20 MG PO CAPS: 20.0000 mg | ORAL_CAPSULE | Freq: Every day | ORAL | 1 refills | Status: DC

## 2016-06-04 MED ORDER — BENAZEPRIL HCL 20 MG PO TABS: 20.0000 mg | ORAL_TABLET | Freq: Every day | ORAL | 1 refills | Status: DC

## 2016-06-05 ENCOUNTER — Encounter: Payer: Self-pay | Admitting: Family Medicine

## 2016-06-05 ENCOUNTER — Ambulatory Visit (INDEPENDENT_AMBULATORY_CARE_PROVIDER_SITE_OTHER): Payer: Medicare Other | Admitting: Family Medicine

## 2016-06-05 VITALS — BP 120/60 | HR 71 | Temp 98.4°F | Ht 62.5 in | Wt 149.5 lb

## 2016-06-05 DIAGNOSIS — M810 Age-related osteoporosis without current pathological fracture: Secondary | ICD-10-CM

## 2016-06-05 DIAGNOSIS — Z23 Encounter for immunization: Secondary | ICD-10-CM | POA: Diagnosis not present

## 2016-06-05 LAB — VITAMIN D 25 HYDROXY (VIT D DEFICIENCY, FRACTURES): VITD: 31.02 ng/mL (ref 30.00–100.00)

## 2016-06-05 MED ORDER — ALENDRONATE SODIUM 70 MG PO TABS: 70.0000 mg | ORAL_TABLET | ORAL | 11 refills | Status: DC

## 2016-06-05 NOTE — Assessment & Plan Note (Signed)
Check vit level. Start ca 600 mg twice daily, vit D 400 IU twice daily.  Start  Weight bearing exercise.. Try to aim for 150 min per week.  If able to wean of omeprazole. Restart if unable to stop without symptoms. Once determine if you can come off PPI.Marland Kitchen Start trial of alendronate. Start once weekly alendronate. Plan recheck DXA in 2 years.

## 2016-06-05 NOTE — Progress Notes (Signed)
   Subjective:    Patient ID: April Ross, female    DOB: 1951-02-10, 65 y.o.   MRN: [id number]  HPI   65 year old female presents for review of her DXA report.  05/22/2016: DXA T-2.8 in left femur neck, -2.3 in AP spine. No previous for comparison.   Ca: on  none  Vit D:  on  none weight bearing exercise: none  on PPI 20 mg     Review of Systems  Constitutional: Negative for fatigue and fever.  HENT: Negative for ear pain.   Eyes: Negative for pain.  Respiratory: Negative for chest tightness and shortness of breath.   Cardiovascular: Negative for chest pain, palpitations and leg swelling.  Gastrointestinal: Negative for abdominal pain.  Genitourinary: Negative for dysuria.       Objective:   Physical Exam  Constitutional: Vital signs are normal. She appears well-developed and well-nourished. She is cooperative.  Non-toxic appearance. She does not appear ill. No distress.  HENT:  Head: Normocephalic.  Right Ear: Hearing, tympanic membrane, external ear and ear canal normal. Tympanic membrane is not erythematous, not retracted and not bulging.  Left Ear: Hearing, tympanic membrane, external ear and ear canal normal. Tympanic membrane is not erythematous, not retracted and not bulging.  Nose: No mucosal edema or rhinorrhea. Right sinus exhibits no maxillary sinus tenderness and no frontal sinus tenderness. Left sinus exhibits no maxillary sinus tenderness and no frontal sinus tenderness.  Mouth/Throat: Uvula is midline, oropharynx is clear and moist and mucous membranes are normal.  Eyes: Conjunctivae, EOM and lids are normal. Pupils are equal, round, and reactive to light. Lids are everted and swept, no foreign bodies found.  Neck: Trachea normal and normal range of motion. Neck supple. Carotid bruit is not present. No thyroid mass and no thyromegaly present.  Cardiovascular: Normal rate, regular rhythm, S1 normal, S2 normal, normal heart sounds, intact distal pulses and  normal pulses.  Exam reveals no gallop and no friction rub.   No murmur heard. Pulmonary/Chest: Effort normal and breath sounds normal. No tachypnea. No respiratory distress. She has no decreased breath sounds. She has no wheezes. She has no rhonchi. She has no rales.  Abdominal: Soft. Normal appearance and bowel sounds are normal. There is no tenderness.  Neurological: She is alert.  Skin: Skin is warm, dry and intact. No Gilman noted.  Psychiatric: Her speech is normal and behavior is normal. Judgment and thought content normal. Her mood appears not anxious. Cognition and memory are normal. She does not exhibit a depressed mood.          Assessment & Plan:

## 2016-06-05 NOTE — Patient Instructions (Addendum)
Stop at lab on way out.  Start ca 600 mg twice daily, vit D 400 IU twice daily.  Start  Weight bearing exercise.. Try to aim for 150 min per week.  If able to wean of omeprazole. Restart if unable to stop without symptoms. Once determine if you can come off PII.Marland Kitchen Start trial of alendronate. Start once weekly alendronate. Plan recheck DXA in 2 years.

## 2016-06-05 NOTE — Progress Notes (Signed)
Pre visit review using our clinic review tool, if applicable. No additional management support is needed unless otherwise documented below in the visit note. 

## 2016-06-07 DIAGNOSIS — K1329 Other disturbances of oral epithelium, including tongue: Secondary | ICD-10-CM | POA: Diagnosis not present

## 2016-06-11 ENCOUNTER — Encounter: Payer: Self-pay | Admitting: Family Medicine

## 2016-06-28 ENCOUNTER — Telehealth: Payer: Medicare Other | Admitting: Physician Assistant

## 2016-06-28 DIAGNOSIS — J329 Chronic sinusitis, unspecified: Secondary | ICD-10-CM

## 2016-06-28 DIAGNOSIS — B9789 Other viral agents as the cause of diseases classified elsewhere: Secondary | ICD-10-CM

## 2016-06-28 DIAGNOSIS — B349 Viral infection, unspecified: Secondary | ICD-10-CM

## 2016-06-28 MED ORDER — FLUTICASONE PROPIONATE 50 MCG/ACT NA SUSP: 2.0000 | Freq: Every day | NASAL | 6 refills | Status: DC

## 2016-06-28 NOTE — Progress Notes (Signed)

## 2016-09-12 ENCOUNTER — Telehealth: Payer: Self-pay

## 2016-09-12 NOTE — Telephone Encounter (Signed)
Pt has appt to see Dr Diona Browner 09/13/16 at 8:30.

## 2016-09-12 NOTE — Telephone Encounter (Signed)
PLEASE NOTE: All timestamps contained within this report are represented as [place] Standard Time. CONFIDENTIALTY NOTICE: This fax transmission is intended only for the addressee. It contains information that is legally privileged, confidential or otherwise protected from use or disclosure. If you are not the intended recipient, you are strictly prohibited from reviewing, disclosing, copying using or disseminating any of this information or taking any action in reliance on or regarding this information. If you have received this fax in error, please notify us immediately by telephone so that we can arrange for its return to [place]. Phone: [phone], Toll-Free: [phone], Fax: [phone] Page: 1 of 2 Call Id: [id number] [place] Patient Name: April Ross Gender: Female DOB: August 11, 1951 Age: 64 Y 21 M 18 D Return Phone Number: [phone] (Primary), [phone] (Secondary) Address: City/State/Zip: [place] Client May Day - Client Client Site [place] - Day Physician Eliezer Lofts - MD Contact Type Call Who Is Calling Patient / Member / Family / Caregiver Call Type Triage / Clinical Relationship To Patient Self Return Phone Number [phone] (Primary) Chief Complaint Dizziness Reason for Call Symptomatic / Request for [place] states that she has been experiencing dizziness, lightheadedness, instability, BP 156/80 BS 174. Hasn't eaten since 11:30. Been under a lot of stress the last few days. Had a relative pass away. Appointment Disposition EMR Appointment Scheduled Info pasted into Epic No PreDisposition Did not know what to do Translation No Nurse Assessment Nurse: Hammonds, RN, Epifanio Lesches Date/Time (Eastern Time): 09/12/2016 3:17:24 PM Confirm and document reason for call. If symptomatic, describe  symptoms. ---Caller states that she has been experiencing dizziness, lightheadedness, instability, BP 156/80 BS 174. Hasn't eaten since 11:30. Been under a lot of stress the last few days. Had a relative pass away. No pain but hearing a swishing sound a couple of times per hour. Benzapril q am. The BS is high for me and usually runs in 150s. The dizziness is constant and begin late sunday. Does the patient have any new or worsening symptoms? ---Yes Will a triage be completed? ---Yes Related visit to physician within the last 2 weeks? ---No Does the PT have any chronic conditions? (i.e. diabetes, asthma, etc.) ---Yes List chronic conditions. ---Diabetes HTN high cholesterol Thyroid condition - Depression Constant sinustitus. Zyrtec. Is this a behavioral health or substance abuse call? ---No Guidelines Guideline Title Affirmed Question Affirmed Notes Nurse Date/Time (Eastern Time) Dizziness - Lightheadedness [1] MODERATE dizziness (e.g., interferes Hammonds, RN, Lissa 09/12/2016 3:20:52 PM PLEASE NOTE: All timestamps contained within this report are represented as [place] Standard Time. CONFIDENTIALTY NOTICE: This fax transmission is intended only for the addressee. It contains information that is legally privileged, confidential or otherwise protected from use or disclosure. If you are not the intended recipient, you are strictly prohibited from reviewing, disclosing, copying using or disseminating any of this information or taking any action in reliance on or regarding this information. If you have received this fax in error, please notify us immediately by telephone so that we can arrange for its return to [place]. Phone: [phone], Toll-Free: [phone], Fax: [phone] Page: 2 of 2 Call Id: [id number] Guidelines Guideline Title Affirmed Question Affirmed Notes Nurse Date/Time Eilene Ghazi Time) with normal activities) AND [2] has NOT been evaluated by physician for this  (Exception: dizziness caused by heat exposure, sudden standing, or poor fluid intake) Disp. Time Eilene Ghazi Time) Disposition Final User 09/12/2016  3:26:14 PM See Physician within 24 Hours Yes Hammonds, RN, Epifanio Lesches Caller Understands: Yes Disagree/Comply: Comply Care Advice Given Per Guideline SEE PHYSICIAN WITHIN 24 HOURS: * IF OFFICE WILL BE OPEN: You need to be seen within the next 24 hours. Call your doctor when the office opens, and make an appointment. CALL BACK IF: * Passes out (faints) * You become worse. CARE ADVICE given per Dizziness (Adult) guideline. Comments User: Moses Manners, RN Date/Time Eilene Ghazi Time): 09/12/2016 3:29:51 PM Unable to access Epic. Contacted office to schedule appointment and contacted caller with office rep. Referrals REFERRED TO PCP OFFICE

## 2016-09-13 ENCOUNTER — Encounter: Payer: Self-pay | Admitting: Family Medicine

## 2016-09-13 ENCOUNTER — Ambulatory Visit (INDEPENDENT_AMBULATORY_CARE_PROVIDER_SITE_OTHER): Payer: Medicare Other | Admitting: Family Medicine

## 2016-09-13 ENCOUNTER — Other Ambulatory Visit: Payer: Self-pay | Admitting: Family Medicine

## 2016-09-13 DIAGNOSIS — R42 Dizziness and giddiness: Secondary | ICD-10-CM | POA: Diagnosis not present

## 2016-09-13 NOTE — Progress Notes (Signed)
   Subjective:    Patient ID: April Ross, female    DOB: [birth year], 65 y.o.   MRN: [id number]  HPI 65 year old female presents with new onset dizziness  X 4 days. Describes dizziness as intermittent, room spinning,  none with sitting still, occurs when she moves around, felt out of balance.  Triggered with lying down and movement.   She has noted in last few days.. Swooshing sound in right ear. No ear pain.  Always has congestion and allergies.  Symptoms are much better today!!!!  No new medicines, no new OTC supplements.  No head injury.  No numbness, no weakness, no new neuro changes.   Under a lot of stress lately.  No hearing loss noted.   Review of Systems  Constitutional: Negative for fatigue and fever.  HENT: Negative for ear pain.   Eyes: Negative for pain.  Respiratory: Negative for chest tightness and shortness of breath.   Cardiovascular: Negative for chest pain, palpitations and leg swelling.  Gastrointestinal: Negative for abdominal pain.  Genitourinary: Negative for dysuria.       Objective:   Physical Exam  Constitutional: She is oriented to person, place, and time. Vital signs are normal. She appears well-developed and well-nourished. She is cooperative.  Non-toxic appearance. She does not appear ill. No distress.  HENT:  Head: Normocephalic.  Right Ear: Hearing, tympanic membrane, external ear and ear canal normal. Tympanic membrane is not erythematous, not retracted and not bulging.  Left Ear: Hearing, tympanic membrane, external ear and ear canal normal. Tympanic membrane is not erythematous, not retracted and not bulging.  Nose: No mucosal edema or rhinorrhea. Right sinus exhibits no maxillary sinus tenderness and no frontal sinus tenderness. Left sinus exhibits no maxillary sinus tenderness and no frontal sinus tenderness.  Mouth/Throat: Uvula is midline, oropharynx is clear and moist and mucous membranes are normal.  Eyes: Conjunctivae, EOM and lids  are normal. Pupils are equal, round, and reactive to light. Lids are everted and swept, no foreign bodies found.  Neck: Trachea normal and normal range of motion. Neck supple. Carotid bruit is not present. No thyroid mass and no thyromegaly present.  Cardiovascular: Normal rate, regular rhythm, S1 normal, S2 normal, normal heart sounds, intact distal pulses and normal pulses.  Exam reveals no gallop and no friction rub.   No murmur heard. Pulmonary/Chest: Effort normal and breath sounds normal. No tachypnea. No respiratory distress. She has no decreased breath sounds. She has no wheezes. She has no rhonchi. She has no rales.  Abdominal: Soft. Normal appearance and bowel sounds are normal. There is no tenderness.  Neurological: She is alert and oriented to person, place, and time. She has normal strength and normal reflexes. No cranial nerve deficit or sensory deficit. She exhibits normal muscle tone. She displays a negative Romberg sign. Coordination and gait normal. GCS eye subscore is 4. GCS verbal subscore is 5. GCS motor subscore is 6.  Nml cerebellar exam   No papilledema  Neg dix hallpike modified.  Skin: Skin is warm, dry and intact. No Housh noted.  Psychiatric: She has a normal mood and affect. Her speech is normal and behavior is normal. Judgment and thought content normal. Her mood appears not anxious. Cognition and memory are normal. Cognition and memory are not impaired. She does not exhibit a depressed mood. She exhibits normal recent memory and normal remote memory.          Assessment & Plan:

## 2016-09-13 NOTE — Patient Instructions (Signed)
If imbalance recurs.. Start home balance exercises.  Continue for now the flonase 2 prays per nostril daily.  Call if cold intolerance continuing for possible thyroid re-evaluation.  Benign Positional Vertigo Introduction Vertigo is the feeling that you or your surroundings are moving when they are not. Benign positional vertigo is the most common form of vertigo. The cause of this condition is not serious (is benign). This condition is triggered by certain movements and positions (is positional). This condition can be dangerous if it occurs while you are doing something that could endanger you or others, such as driving. What are the causes? In many cases, the cause of this condition is not known. It may be caused by a disturbance in an area of the inner ear that helps your brain to sense movement and balance. This disturbance can be caused by a viral infection (labyrinthitis), head injury, or repetitive motion. What increases the risk? This condition is more likely to develop in:  Women.  People who are 78 years of age or older. What are the signs or symptoms? Symptoms of this condition usually happen when you move your head or your eyes in different directions. Symptoms may start suddenly, and they usually last for less than a minute. Symptoms may include:  Loss of balance and falling.  Feeling like you are spinning or moving.  Feeling like your surroundings are spinning or moving.  Nausea and vomiting.  Blurred vision.  Dizziness.  Involuntary eye movement (nystagmus). Symptoms can be mild and cause only slight annoyance, or they can be severe and interfere with daily life. Episodes of benign positional vertigo may return (recur) over time, and they may be triggered by certain movements. Symptoms may improve over time. How is this diagnosed? This condition is usually diagnosed by medical history and a physical exam of the head, neck, and ears. You may be referred to a health  care provider who specializes in ear, nose, and throat (ENT) problems (otolaryngologist) or a provider who specializes in disorders of the nervous system (neurologist). You may have additional testing, including:  MRI.  A CT scan.  Eye movement tests. Your health care provider may ask you to change positions quickly while he or she watches you for symptoms of benign positional vertigo, such as nystagmus. Eye movement may be tested with an electronystagmogram (ENG), caloric stimulation, the Dix-Hallpike test, or the roll test.  An electroencephalogram (EEG). This records electrical activity in your brain.  Hearing tests. How is this treated? Usually, your health care provider will treat this by moving your head in specific positions to adjust your inner ear back to normal. Surgery may be needed in severe cases, but this is rare. In some cases, benign positional vertigo may resolve on its own in 2-4 weeks. Follow these instructions at home: Safety  Move slowly.Avoid sudden body or head movements.  Avoid driving.  Avoid operating heavy machinery.  Avoid doing any tasks that would be dangerous to you or others if a vertigo episode would occur.  If you have trouble walking or keeping your balance, try using a cane for stability. If you feel dizzy or unstable, sit down right away.  Return to your normal activities as told by your health care provider. Ask your health care provider what activities are safe for you. General instructions  Take over-the-counter and prescription medicines only as told by your health care provider.  Avoid certain positions or movements as told by your health care provider.  Drink enough fluid  to keep your urine clear or pale yellow.  Keep all follow-up visits as told by your health care provider. This is important. Contact a health care provider if:  You have a fever.  Your condition gets worse or you develop new symptoms.  Your family or friends notice  any behavioral changes.  Your nausea or vomiting gets worse.  You have numbness or a "pins and needles" sensation. Get help right away if:  You have difficulty speaking or moving.  You are always dizzy.  You faint.  You develop severe headaches.  You have weakness in your legs or arms.  You have changes in your hearing or vision.  You develop a stiff neck.  You develop sensitivity to light. This information is not intended to replace advice given to you by your health care provider. Make sure you discuss any questions you have with your health care provider. Document Released: 07/02/2006 Document Revised: 03/01/2016 Document Reviewed: 01/17/2015  2017 [employer]vier

## 2016-09-13 NOTE — Assessment & Plan Note (Signed)
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 Given home desensitization exercise if recurs. Treat any ETD with flonase.

## 2016-09-13 NOTE — Progress Notes (Signed)
Pre visit review using our clinic review tool, if applicable. No additional management support is needed unless otherwise documented below in the visit note. 

## 2016-09-15 ENCOUNTER — Other Ambulatory Visit: Payer: Self-pay | Admitting: Family Medicine

## 2016-10-31 ENCOUNTER — Telehealth: Payer: Self-pay | Admitting: Family Medicine

## 2016-10-31 DIAGNOSIS — E7800 Pure hypercholesterolemia, unspecified: Secondary | ICD-10-CM

## 2016-10-31 DIAGNOSIS — E1121 Type 2 diabetes mellitus with diabetic nephropathy: Secondary | ICD-10-CM

## 2016-10-31 DIAGNOSIS — N183 Chronic kidney disease, stage 3 unspecified: Secondary | ICD-10-CM

## 2016-10-31 NOTE — Telephone Encounter (Signed)
-----   Message from Ellamae Sia sent at 10/30/2016  2:38 PM EST ----- Regarding: Lab orders for Monday, 1.29.18 Lab orders for a DM f/u

## 2016-11-05 ENCOUNTER — Other Ambulatory Visit (INDEPENDENT_AMBULATORY_CARE_PROVIDER_SITE_OTHER): Payer: Medicare Other

## 2016-11-05 DIAGNOSIS — E7800 Pure hypercholesterolemia, unspecified: Secondary | ICD-10-CM | POA: Diagnosis not present

## 2016-11-05 DIAGNOSIS — E1121 Type 2 diabetes mellitus with diabetic nephropathy: Secondary | ICD-10-CM

## 2016-11-05 LAB — COMPREHENSIVE METABOLIC PANEL
ALT: 13 U/L (ref 0–35)
AST: 16 U/L (ref 0–37)
Albumin: 4.7 g/dL (ref 3.5–5.2)
Alkaline Phosphatase: 48 U/L (ref 39–117)
BILIRUBIN TOTAL: 0.4 mg/dL (ref 0.2–1.2)
BUN: 17 mg/dL (ref 6–23)
CALCIUM: 9.7 mg/dL (ref 8.4–10.5)
CHLORIDE: 105 meq/L (ref 96–112)
CO2: 29 meq/L (ref 19–32)
CREATININE: 1.22 mg/dL — AB (ref 0.40–1.20)
GFR: 46.63 mL/min — AB (ref >60.00)
Glucose, Bld: 140 mg/dL — ABNORMAL HIGH (ref 70–99)
Potassium: 4.5 mEq/L (ref 3.5–5.1)
Sodium: 140 mEq/L (ref 135–145)
Total Protein: 7.1 g/dL (ref 6.0–8.3)

## 2016-11-05 LAB — LIPID PANEL
CHOL/HDL RATIO: 5
CHOLESTEROL: 187 mg/dL (ref 0–200)
HDL: 34.7 mg/dL — ABNORMAL LOW (ref >39.00)
NonHDL: 151.97
TRIGLYCERIDES: 219 mg/dL — AB (ref 0.0–149.0)
VLDL: 43.8 mg/dL — AB (ref 0.0–40.0)

## 2016-11-05 LAB — HEMOGLOBIN A1C: HEMOGLOBIN A1C: 6.7 % — AB (ref 4.6–6.5)

## 2016-11-05 LAB — LDL CHOLESTEROL, DIRECT: LDL DIRECT: 116 mg/dL

## 2016-11-09 ENCOUNTER — Encounter: Payer: Self-pay | Admitting: Family Medicine

## 2016-11-09 ENCOUNTER — Ambulatory Visit (INDEPENDENT_AMBULATORY_CARE_PROVIDER_SITE_OTHER): Payer: Medicare Other | Admitting: Family Medicine

## 2016-11-09 DIAGNOSIS — I10 Essential (primary) hypertension: Secondary | ICD-10-CM | POA: Diagnosis not present

## 2016-11-09 DIAGNOSIS — N183 Chronic kidney disease, stage 3 unspecified: Secondary | ICD-10-CM

## 2016-11-09 DIAGNOSIS — A084 Viral intestinal infection, unspecified: Secondary | ICD-10-CM | POA: Insufficient documentation

## 2016-11-09 DIAGNOSIS — E7800 Pure hypercholesterolemia, unspecified: Secondary | ICD-10-CM | POA: Diagnosis not present

## 2016-11-09 DIAGNOSIS — E1121 Type 2 diabetes mellitus with diabetic nephropathy: Secondary | ICD-10-CM | POA: Diagnosis not present

## 2016-11-09 DIAGNOSIS — S29012A Strain of muscle and tendon of back wall of thorax, initial encounter: Secondary | ICD-10-CM | POA: Diagnosis not present

## 2016-11-09 LAB — HM DIABETES FOOT EXAM

## 2016-11-09 MED ORDER — PROMETHAZINE HCL 25 MG/ML IJ SOLN
25.0000 mg | Freq: Once | INTRAMUSCULAR | Status: AC
  Administered 2016-11-09: 25 mg via INTRAMUSCULAR

## 2016-11-09 MED ORDER — ONDANSETRON 8 MG PO TBDP: 8.0000 mg | ORAL_TABLET | Freq: Three times a day (TID) | ORAL | 0 refills | Status: DC | PRN

## 2016-11-09 NOTE — Assessment & Plan Note (Signed)
Tylenol , gentle stretching and heat.

## 2016-11-09 NOTE — Progress Notes (Signed)
Subjective:    Patient ID: April Ross, female    DOB: [birth year], 66 y.o.   MRN: [id number]  HPI   66 year old female presents for DM follow up but she is also feeling ill this morning.   She reports:  In last 2 days having fever  99.3-99.4 F Body aches.  Started with emesis, multiple times. No abd pain. No diarrhea Mild sore throat from post nasal drip, rare cough.  No sick contact.. Does have grandchildren.  Several months initially intermittant now constant: She has dull ache in  In upper back between below shoulder blades on left side bottom of rib cage.  Constant dull ache.  Moving makes it hurt more.  Difficult to get comfortable to sleep. No dysuria, no increase in freq or urgency.  Fell slipped on ice about 2 months ago. Landed on side.   Hx of osteoporosis.    Diabetes:  Good control on metformin Lab Results  Component Value Date   HGBA1C 6.7 (H) 11/05/2016  Using medications without difficulties: Hypoglycemic episodes: Hyperglycemic episodes: Feet problems: Blood Sugars averaging: not checking eye exam within last year:  Elevated Cholesterol:  moderate control on moderate dose statin crestor 20 mg daily. Lab Results  Component Value Date   CHOL 187 11/05/2016   HDL 34.70 (L) 11/05/2016   LDLDIRECT 116.0 11/05/2016   TRIG 219.0 (H) 11/05/2016   CHOLHDL 5 11/05/2016  Using medications without problems: Muscle aches:  Diet compliance: moderate Exercise: walking Other complaints:  Hypertension:   BP very elevated today  She is on benazepril but threw it up this morning. BP Readings from Last 3 Encounters:  11/09/16 (!) 200/99  09/13/16 (!) 148/74  06/05/16 120/60  Using medication without problems or lightheadedness: None Chest pain with exertion: None Edema:None Short of breath: None Average home BPs: 130/75 Other issues:    Review of Systems  Constitutional: Positive for fatigue and fever.  HENT: Negative for ear pain.   Eyes:  Negative for pain.  Respiratory: Negative for chest tightness and shortness of breath.   Cardiovascular: Negative for chest pain, palpitations and leg swelling.  Gastrointestinal: Positive for nausea and vomiting. Negative for abdominal pain.  Genitourinary: Negative for dysuria.       Objective:   Physical Exam  Constitutional: Vital signs are normal. She appears well-developed and well-nourished. She is cooperative.  Non-toxic appearance. She does not appear ill. No distress.  HENT:  Head: Normocephalic.  Right Ear: Hearing, tympanic membrane, external ear and ear canal normal. Tympanic membrane is not erythematous, not retracted and not bulging.  Left Ear: Hearing, tympanic membrane, external ear and ear canal normal. Tympanic membrane is not erythematous, not retracted and not bulging.  Nose: No mucosal edema or rhinorrhea. Right sinus exhibits no maxillary sinus tenderness and no frontal sinus tenderness. Left sinus exhibits no maxillary sinus tenderness and no frontal sinus tenderness.  Mouth/Throat: Uvula is midline, oropharynx is clear and moist and mucous membranes are normal.  Eyes: Conjunctivae, EOM and lids are normal. Pupils are equal, round, and reactive to light. Lids are everted and swept, no foreign bodies found.  Neck: Trachea normal and normal range of motion. Neck supple. Carotid bruit is not present. No thyroid mass and no thyromegaly present.  Cardiovascular: Normal rate, regular rhythm, S1 normal, S2 normal, normal heart sounds, intact distal pulses and normal pulses.  Exam reveals no gallop and no friction rub.   No murmur heard. Pulmonary/Chest: Effort normal and breath sounds normal.  No tachypnea. No respiratory distress. She has no decreased breath sounds. She has no wheezes. She has no rhonchi. She has no rales.  Abdominal: Soft. Normal appearance and bowel sounds are normal. There is no tenderness.  Musculoskeletal:  No vertebral ttp, ttp over left upper back  under shoulder blade  Neurological: She is alert.  Skin: Skin is warm, dry and intact. No Poke noted.  Psychiatric: Her speech is normal and behavior is normal. Judgment and thought content normal. Her mood appears not anxious. Cognition and memory are normal. She does not exhibit a depressed mood.    Diabetic foot exam: Normal inspection No skin breakdown No calluses  Normal DP pulses Normal sensation to light touch and monofilament Nails normal       Assessment & Plan:

## 2016-11-09 NOTE — Assessment & Plan Note (Signed)
Stablecontrol on current med. Diuscused medication use and care during acute illness.

## 2016-11-09 NOTE — Assessment & Plan Note (Addendum)
Phenergan given for nausea.  Discussed sick care in detail. Push fluids gradually.  Rx for oral zofran given.

## 2016-11-09 NOTE — Assessment & Plan Note (Signed)
Inadequate control but pt sick and will wait on med adjustment.

## 2016-11-09 NOTE — Assessment & Plan Note (Signed)
Stable control. Work on DM and HTN control.

## 2016-11-09 NOTE — Patient Instructions (Addendum)
Can use sublingual zofran that you have to help with nausea.  Push fluids.  Take medication for blood pressure when you get home, hold metformin until you are eating or at least keeping down gatorade.  Follow blood sugars at home to make sure not too high or low.   When well:  Increase walking to 3-5 times a week. Work on low cholesterol. Continue crestor.

## 2016-11-09 NOTE — Progress Notes (Signed)
Pre visit review using our clinic review tool, if applicable. No additional management support is needed unless otherwise documented below in the visit note. 

## 2016-11-09 NOTE — Assessment & Plan Note (Signed)
Poor control today given emesis and not keeping down meds.  Usually well controlled... Take med again ASAP once keeping down fluids/ sugars.

## 2016-12-24 ENCOUNTER — Other Ambulatory Visit: Payer: Self-pay | Admitting: Family Medicine

## 2016-12-26 ENCOUNTER — Telehealth: Payer: Self-pay | Admitting: *Deleted

## 2016-12-26 ENCOUNTER — Ambulatory Visit (INDEPENDENT_AMBULATORY_CARE_PROVIDER_SITE_OTHER): Payer: Medicare Other | Admitting: *Deleted

## 2016-12-26 DIAGNOSIS — Z111 Encounter for screening for respiratory tuberculosis: Secondary | ICD-10-CM

## 2016-12-26 NOTE — Telephone Encounter (Signed)
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 I've placed form in your inbox.

## 2016-12-27 NOTE — Telephone Encounter (Signed)
Completed.

## 2016-12-28 ENCOUNTER — Encounter: Payer: Self-pay | Admitting: *Deleted

## 2016-12-28 LAB — TB SKIN TEST
INDURATION: 0 mm
TB SKIN TEST: NEGATIVE

## 2016-12-28 NOTE — Telephone Encounter (Signed)
Tb test read, and copy of completed paper work sent to be scanned in EMR, then original given to pt.

## 2017-01-07 ENCOUNTER — Other Ambulatory Visit: Payer: Self-pay | Admitting: Family Medicine

## 2017-03-03 ENCOUNTER — Other Ambulatory Visit: Payer: Self-pay | Admitting: Family Medicine

## 2017-03-07 ENCOUNTER — Other Ambulatory Visit: Payer: Self-pay | Admitting: Family Medicine

## 2017-03-11 ENCOUNTER — Other Ambulatory Visit: Payer: Self-pay | Admitting: Family Medicine

## 2017-05-03 NOTE — Progress Notes (Signed)
Subjective:   April Ross is a 66 y.o. female who presents for Medicare Annual (Subsequent) preventive examination.  Review of Systems:  No ROS.  Medicare Wellness Visit. Additional risk factors are reflected in the social history.  Cardiac Risk Factors include: advanced age (>74men, >49 women);diabetes mellitus;hypertension;dyslipidemia;sedentary lifestyle     Objective:     Vitals: BP (!) 142/64 (BP Location: Right Arm, Patient Position: Sitting, Cuff Size: Normal) Comment: Pt did not take her morning medications.  Pulse 63   Resp 16   Ht 5' 2.5" (1.588 m)   Wt 146 lb 1.9 oz (66.3 kg)   SpO2 98%   BMI 26.30 kg/m   Body mass index is 26.3 kg/m.   Tobacco History  Smoking Status  . Former Smoker  . Packs/day: 0.50  . Types: Cigarettes  . Quit date: 04/30/2016  Smokeless Tobacco  . Never Used     Counseling given: Not Answered   Past Medical History:  Diagnosis Date  . Allergy   . Arthritis   . Depression   . Diabetes mellitus without complication ([place])   . GERD (gastroesophageal reflux disease)   . Hyperlipidemia   . Hypertension   . polyps in colon   . Thyroid disease   . Urinary tract infection    Past Surgical History:  Procedure Laterality Date  . ABDOMINAL HYSTERECTOMY    . APPENDECTOMY     Family History  Problem Relation Age of Onset  . Alzheimer's disease Mother   . Hyperlipidemia Mother   . Hypertension Mother   . Asthma Maternal Grandmother   . Diabetes Maternal Grandfather   . Alcohol abuse Maternal Grandfather   . Hyperlipidemia Maternal Grandfather   . Stroke Maternal Grandfather   . Hypertension Maternal Grandfather   . Diabetes Paternal Grandmother   . Alcohol abuse Maternal Uncle   . Cholecystitis Maternal Uncle   . Arthritis Maternal Uncle    History  Sexual Activity  . Sexual activity: Not on file    Outpatient Encounter Prescriptions as of 05/09/2017  Medication Sig  . alendronate (FOSAMAX) 70 MG tablet Take 1  tablet (70 mg total) by mouth every 7 (seven) days. Take with a full glass of water on an empty stomach.  . benazepril (LOTENSIN) 20 MG tablet TAKE 1 TABLET (20 MG TOTAL) BY MOUTH DAILY.  . cetirizine (ZYRTEC) 10 MG tablet Take 10 mg by mouth at bedtime.  Marland Kitchen FLUoxetine (PROZAC) 20 MG capsule TAKE ONE CAPSULE BY MOUTH EVERY DAY  . fluticasone (FLONASE) 50 MCG/ACT nasal spray Place 2 sprays into both nostrils daily.  Marland Kitchen levothyroxine (SYNTHROID, LEVOTHROID) 88 MCG tablet Take 88 mcg by mouth daily.  . metFORMIN (GLUCOPHAGE) 1000 MG tablet TAKE 1 TABLET BY MOUTH TWICE A DAY  . omeprazole (PRILOSEC) 20 MG capsule TAKE 1 CAPSULE BY MOUTH EVERY DAY  . ondansetron (ZOFRAN-ODT) 8 MG disintegrating tablet Take 1 tablet (8 mg total) by mouth every 8 (eight) hours as needed for nausea or vomiting.  . rosuvastatin (CRESTOR) 20 MG tablet Take 1 tablet (20 mg total) by mouth daily.  . traMADol (ULTRAM) 50 MG tablet Take 1 tablet (50 mg total) by mouth every 4 (four) hours as needed for severe pain.  . traZODone (DESYREL) 100 MG tablet TAKE 1 TABLET AT BEDTIME  . zolpidem (AMBIEN CR) 6.25 MG CR tablet Take 6.25 mg by mouth at bedtime as needed for sleep.   No facility-administered encounter medications on file as of 05/09/2017.  Activities of Daily Living In your present state of health, do you have any difficulty performing the following activities: 05/09/2017  Hearing? N  Vision? N  Difficulty concentrating or making decisions? N  Walking or climbing stairs? N  Dressing or bathing? N  Doing errands, shopping? N  Preparing Food and eating ? N  Using the Toilet? N  In the past six months, have you accidently leaked urine? Y  Comment Only with laughing or sneezing or at night.  Do you have problems with loss of bowel control? N  Managing your Medications? N  Managing your Finances? N  Housekeeping or managing your Housekeeping? N  Some recent data might be hidden    Patient Care Team: Jinny Sanders, MD as PCP - General (Family Medicine)    Assessment:    Physical assessment deferred to PCP.  Exercise Activities and Dietary recommendations Current Exercise Habits: The patient does not participate in regular exercise at present, Exercise limited by: None identified  Goals    None     Fall Risk Fall Risk  05/08/2016  Falls in the past year? Yes  Number falls in past yr: 2 or more  Injury with Fall? No   Depression Screen PHQ 2/9 Scores 05/08/2016  PHQ - 2 Score 0     Cognitive Function PLEASE NOTE: A Mini-Cog screen was completed. Maximum score is 20. A value of 0 denotes this part of Folstein MMSE was not completed or the patient failed this part of the Mini-Cog screening.   Mini-Cog Screening Orientation to Time - Max 5 pts Orientation to Place - Max 5 pts Registration - Max 3 pts Recall - Max 3 pts Language Repeat - Max 1 pts Language Follow 3 Step Command - Max 3 pts             Immunization History  Administered Date(s) Administered  . Influenza,inj,Quad PF,36+ Mos 06/05/2016  . PPD Test 12/26/2016  . Pneumococcal Conjugate-13 02/25/2014  . Pneumococcal Polysaccharide-23 08/04/2009, 05/08/2016  . Tdap 06/05/2016  . Zoster 08/13/2012   Screening Tests Health Maintenance  Topic Date Due  . OPHTHALMOLOGY EXAM  04/25/2017  . HEMOGLOBIN A1C  05/05/2017  . INFLUENZA VACCINE  05/08/2017  . FOOT EXAM  11/09/2017  . MAMMOGRAM  05/22/2018  . COLONOSCOPY  03/23/2020  . TETANUS/TDAP  06/05/2026  . DEXA SCAN  Completed  . Hepatitis C Screening  Completed  . PNA vac Low Risk Adult  Completed      Plan:   Follow up with PCP as directed.  I have personally reviewed and noted the following in the patient's chart:   . Medical and social history . Use of alcohol, tobacco or illicit drugs  . Current medications and supplements . Functional ability and status . Nutritional status . Physical activity . Advanced directives . List of other  physicians . Vitals . Screenings to include cognitive, depression, and falls . Referrals and appointments  In addition, I have reviewed and discussed with patient certain preventive protocols, quality metrics, and best practice recommendations. A written personalized care plan for preventive services as well as general preventive health recommendations were provided to patient.     Ree Edman, RN  05/09/2017

## 2017-05-03 NOTE — Progress Notes (Signed)
PCP notes:   Health maintenance: Opthamology - pt will have [facility] send results after her next appt.    Abnormal screenings:  PHQ9 score: 9   Patient concerns:  Pt would like clarification on medication administration schedule of Fosamax.  Pt concerned about long term use of Meformin. Pt states she has been on this for 12-15 years.   Pt also states that she has been experiencing lightheadedness. States this happens several times/week.   States that her gait is getting slower.   Nurse concerns: none.    Next PCP appt: 05/16/2017.

## 2017-05-09 ENCOUNTER — Telehealth: Payer: Self-pay | Admitting: Family Medicine

## 2017-05-09 ENCOUNTER — Ambulatory Visit: Payer: Medicare Other

## 2017-05-09 ENCOUNTER — Other Ambulatory Visit (INDEPENDENT_AMBULATORY_CARE_PROVIDER_SITE_OTHER): Payer: Medicare Other

## 2017-05-09 VITALS — BP 142/64 | HR 63 | Resp 16 | Ht 62.5 in | Wt 146.1 lb

## 2017-05-09 DIAGNOSIS — M810 Age-related osteoporosis without current pathological fracture: Secondary | ICD-10-CM

## 2017-05-09 DIAGNOSIS — E039 Hypothyroidism, unspecified: Secondary | ICD-10-CM

## 2017-05-09 DIAGNOSIS — E7800 Pure hypercholesterolemia, unspecified: Secondary | ICD-10-CM | POA: Diagnosis not present

## 2017-05-09 DIAGNOSIS — E1121 Type 2 diabetes mellitus with diabetic nephropathy: Secondary | ICD-10-CM

## 2017-05-09 DIAGNOSIS — Z0000 Encounter for general adult medical examination without abnormal findings: Secondary | ICD-10-CM

## 2017-05-09 LAB — LIPID PANEL
CHOL/HDL RATIO: 5
CHOLESTEROL: 158 mg/dL (ref 0–200)
HDL: 33.3 mg/dL — AB (ref >39.00)
LDL Cholesterol: 85 mg/dL (ref 0–99)
NonHDL: 124.64
TRIGLYCERIDES: 200 mg/dL — AB (ref 0.0–149.0)
VLDL: 40 mg/dL (ref 0.0–40.0)

## 2017-05-09 LAB — COMPREHENSIVE METABOLIC PANEL
ALK PHOS: 40 U/L (ref 39–117)
ALT: 14 U/L (ref 0–35)
AST: 16 U/L (ref 0–37)
Albumin: 4.2 g/dL (ref 3.5–5.2)
BUN: 20 mg/dL (ref 6–23)
CALCIUM: 9 mg/dL (ref 8.4–10.5)
CO2: 28 meq/L (ref 19–32)
Chloride: 106 mEq/L (ref 96–112)
Creatinine, Ser: 1.28 mg/dL — ABNORMAL HIGH (ref 0.40–1.20)
GFR: 44.05 mL/min — AB (ref >60.00)
GLUCOSE: 134 mg/dL — AB (ref 70–99)
POTASSIUM: 4.5 meq/L (ref 3.5–5.1)
Sodium: 139 mEq/L (ref 135–145)
TOTAL PROTEIN: 6.5 g/dL (ref 6.0–8.3)
Total Bilirubin: 0.4 mg/dL (ref 0.2–1.2)

## 2017-05-09 LAB — VITAMIN D 25 HYDROXY (VIT D DEFICIENCY, FRACTURES): VITD: 46.97 ng/mL (ref 30.00–100.00)

## 2017-05-09 LAB — T3, FREE: T3 FREE: 2.8 pg/mL (ref 2.3–4.2)

## 2017-05-09 LAB — T4, FREE: Free T4: 1.09 ng/dL (ref 0.60–1.60)

## 2017-05-09 LAB — HEMOGLOBIN A1C: Hgb A1c MFr Bld: 6.6 % — ABNORMAL HIGH (ref 4.6–6.5)

## 2017-05-09 LAB — TSH: TSH: 0.37 u[IU]/mL (ref 0.35–4.50)

## 2017-05-09 NOTE — Patient Instructions (Addendum)
April Ross , Thank you for taking time to come for your Medicare Wellness Visit. I appreciate your ongoing commitment to your health goals. Please review the following plan we discussed and let me know if I can assist you in the future.   These are the goals we discussed: Goals    . Be able to come off of some medications.           "I would need to lose 25 lbs"        This is a list of the screening recommended for you and due dates:  Health Maintenance  Topic Date Due  . Eye exam for diabetics  04/25/2017  . Hemoglobin A1C  05/05/2017  . Flu Shot  05/08/2017  . Complete foot exam   11/09/2017  . Mammogram  05/22/2018  . Colon Cancer Screening  03/23/2020  . Tetanus Vaccine  06/05/2026  . DEXA scan (bone density measurement)  Completed  .  Hepatitis C: One time screening is recommended by Center for Disease Control  (CDC) for  adults born from [birth year] through 1967.   Completed  . Pneumonia vaccines  Completed   Preventive Care for Adults  A healthy lifestyle and preventive care can promote health and wellness. Preventive health guidelines for adults include the following key practices.  . A routine yearly physical is a good way to check with your health care provider about your health and preventive screening. It is a chance to share any concerns and updates on your health and to receive a thorough exam.  . Visit your dentist for a routine exam and preventive care every 6 months. Brush your teeth twice a day and floss once a day. Good oral hygiene prevents tooth decay and gum disease.  . The frequency of eye exams is based on your age, health, family medical history, use  of contact lenses, and other factors. Follow your health care provider's ecommendations for frequency of eye exams.  . Eat a healthy diet. Foods like vegetables, fruits, whole grains, low-fat dairy products, and lean protein foods contain the nutrients you need without too many calories. Decrease your intake of  foods high in solid fats, added sugars, and salt. Eat the right amount of calories for you. Get information about a proper diet from your health care provider, if necessary.  . Regular physical exercise is one of the most important things you can do for your health. Most adults should get at least 150 minutes of moderate-intensity exercise (any activity that increases your heart rate and causes you to sweat) each week. In addition, most adults need muscle-strengthening exercises on 2 or more days a week.  Silver Sneakers may be a benefit available to you. To determine eligibility, you may visit the website: www.silversneakers.com or contact program at [phone] Mon-Fri between 8AM-8PM.   . Maintain a healthy weight. The body mass index (BMI) is a screening tool to identify possible weight problems. It provides an estimate of body fat based on height and weight. Your health care provider can find your BMI and can help you achieve or maintain a healthy weight.   For adults 20 years and older: ? A BMI below 18.5 is considered underweight. ? A BMI of 18.5 to 24.9 is normal. ? A BMI of 25 to 29.9 is considered overweight. ? A BMI of 30 and above is considered obese.   . Maintain normal blood lipids and cholesterol levels by exercising and minimizing your intake of saturated fat. Eat  a balanced diet with plenty of fruit and vegetables. Blood tests for lipids and cholesterol should begin at age 69 and be repeated every 5 years. If your lipid or cholesterol levels are high, you are over 50, or you are at high risk for heart disease, you may need your cholesterol levels checked more frequently. Ongoing high lipid and cholesterol levels should be treated with medicines if diet and exercise are not working.  . If you smoke, find out from your health care provider how to quit. If you do not use tobacco, please do not start.  . If you choose to drink alcohol, please do not consume more than 2 drinks per  day. One drink is considered to be 12 ounces (355 mL) of beer, 5 ounces (148 mL) of wine, or 1.5 ounces (44 mL) of liquor.  . If you are 77-10 years old, ask your health care provider if you should take aspirin to prevent strokes.  . Use sunscreen. Apply sunscreen liberally and repeatedly throughout the day. You should seek shade when your shadow is shorter than you. Protect yourself by wearing long sleeves, pants, a wide-brimmed hat, and sunglasses year round, whenever you are outdoors.  . Once a month, do a whole body skin exam, using a mirror to look at the skin on your back. Tell your health care provider of new moles, moles that have irregular borders, moles that are larger than a pencil eraser, or moles that have changed in shape or color.

## 2017-05-09 NOTE — Telephone Encounter (Signed)
-----   Message from Ellamae Sia sent at 04/29/2017 12:33 PM EDT ----- Regarding: Lab orders for Thursday, 8.2.18  AWV lab orders, please.

## 2017-05-09 NOTE — Progress Notes (Signed)
I reviewed health advisor's note, was available for consultation, and agree with documentation and plan.  

## 2017-05-14 ENCOUNTER — Encounter: Payer: Self-pay | Admitting: Family Medicine

## 2017-05-16 ENCOUNTER — Encounter: Payer: Self-pay | Admitting: Family Medicine

## 2017-05-16 ENCOUNTER — Ambulatory Visit (INDEPENDENT_AMBULATORY_CARE_PROVIDER_SITE_OTHER): Payer: Medicare Other | Admitting: Family Medicine

## 2017-05-16 VITALS — BP 136/63 | HR 60 | Temp 98.3°F | Ht 62.5 in | Wt 144.5 lb

## 2017-05-16 DIAGNOSIS — R42 Dizziness and giddiness: Secondary | ICD-10-CM

## 2017-05-16 DIAGNOSIS — N183 Chronic kidney disease, stage 3 unspecified: Secondary | ICD-10-CM

## 2017-05-16 DIAGNOSIS — E7800 Pure hypercholesterolemia, unspecified: Secondary | ICD-10-CM

## 2017-05-16 DIAGNOSIS — E1121 Type 2 diabetes mellitus with diabetic nephropathy: Secondary | ICD-10-CM | POA: Diagnosis not present

## 2017-05-16 DIAGNOSIS — F321 Major depressive disorder, single episode, moderate: Secondary | ICD-10-CM | POA: Diagnosis not present

## 2017-05-16 DIAGNOSIS — E039 Hypothyroidism, unspecified: Secondary | ICD-10-CM

## 2017-05-16 DIAGNOSIS — Z0000 Encounter for general adult medical examination without abnormal findings: Secondary | ICD-10-CM | POA: Diagnosis not present

## 2017-05-16 LAB — CBC WITH DIFFERENTIAL/PLATELET
BASOS ABS: 0.1 10*3/uL (ref 0.0–0.1)
Basophils Relative: 0.8 % (ref 0.0–3.0)
EOS ABS: 0.5 10*3/uL (ref 0.0–0.7)
Eosinophils Relative: 6.3 % — ABNORMAL HIGH (ref 0.0–5.0)
HCT: 38.8 % (ref 36.0–46.0)
Hemoglobin: 12.9 g/dL (ref 12.0–15.0)
LYMPHS ABS: 2.2 10*3/uL (ref 0.7–4.0)
Lymphocytes Relative: 27.8 % (ref 12.0–46.0)
MCHC: 33.2 g/dL (ref 30.0–36.0)
MCV: 85.1 fl (ref 78.0–100.0)
MONOS PCT: 12.5 % — AB (ref 3.0–12.0)
Monocytes Absolute: 1 10*3/uL (ref 0.1–1.0)
Neutro Abs: 4.1 10*3/uL (ref 1.4–7.7)
Neutrophils Relative %: 52.6 % (ref 43.0–77.0)
Platelets: 255 10*3/uL (ref 150.0–400.0)
RBC: 4.56 Mil/uL (ref 3.87–5.11)
RDW: 13.8 % (ref 11.5–15.5)
WBC: 7.7 10*3/uL (ref 4.0–10.5)

## 2017-05-16 LAB — HM DIABETES FOOT EXAM

## 2017-05-16 NOTE — Assessment & Plan Note (Signed)
Stable control. Continue to follow. Pt considering referral to nephrology.

## 2017-05-16 NOTE — Assessment & Plan Note (Addendum)
Good control with lifestyle and current meds.  Discussed considering changing metformin to a different medication if CKD progresses.

## 2017-05-16 NOTE — Assessment & Plan Note (Signed)
Well controlled. Continue current medication.. Crestor. No SE.

## 2017-05-16 NOTE — Assessment & Plan Note (Signed)
Eval with labs. Check BP and glucose when occurring.

## 2017-05-16 NOTE — Assessment & Plan Note (Signed)
Stable control at this time. 

## 2017-05-16 NOTE — Patient Instructions (Addendum)
Check blood sugar when feeling lightheaded. Keep up with water intake. Call if interested in changing benazapril to a medication that does not cause cough.  Please stop at the lab to have labs drawn.

## 2017-05-16 NOTE — Assessment & Plan Note (Signed)
Well controlled. Continue current medication.  

## 2017-05-16 NOTE — Progress Notes (Signed)
Subjective:    Patient ID: April Ross, female    DOB: Jul 13, 1951, 66 y.o.   MRN: [id number]  HPI   The patient presents for  complete physical and review of chronic health problems. He/She also has the following acute concerns today: see below concerns from AMW part 1  The patient saw Candis Musa, LPN for medicare wellness. Note reviewed in detail and important notes copied below.  Health maintenance: Opthamology - pt will have [facility] send results after her next appt.  Abnormal screenings:  PHQ9 score: 9 Patient concerns:  Pt would like clarification on medication administration schedule of Fosamax. Pt concerned about long term use of Meformin. Pt states she has been on this for 12-15 years.  Pt also states that she has been experiencing lightheadedness. States this happens several times/week.  States that her gait is getting slower.  Nurse concerns: none.   Diabetes:   Good control on metformin Lab Results  Component Value Date   HGBA1C 6.6 (H) 05/09/2017  Using medications without difficulties: Hypoglycemic episodes: none Hyperglycemic episodes: none Feet problems: no  Blood Sugars averaging: not checking regualrley eye exam within last year:  CKD stage 3, stable  No family history of kidney issues.  Hypertension:   At goal on benazepril .Marland Kitchen Has dry cough that bothers her a lot. BP Readings from Last 3 Encounters:  05/16/17 136/63  05/09/17 (!) 142/64  11/09/16 (!) 200/99  Using medication without problems or lightheadedness:  occ feeling unsteady on feet. Slightly high BP at that time.  Several times a week. Chest pain with exertion:none Edema:none Short of breath: none Average home BPs: good Other issues:  Elevated Cholesterol: At goal on  crestor Lab Results  Component Value Date   CHOL 158 05/09/2017   HDL 33.30 (L) 05/09/2017   LDLCALC 85 05/09/2017   LDLDIRECT 116.0 11/05/2016   TRIG 200.0 (H) 05/09/2017   CHOLHDL 5 05/09/2017   Using medications without problems:none Muscle aches: none Diet compliance: low chol and low carb diet Exercise: walking daily, now using fit bit Other complaints:  recent decrease in energy lately. No bleeding.  Vit D in nml range   Hypothyroid: stable on levothyroxine  Social History /Family History/Past Medical History reviewed in detail and updated in EMR if needed. Blood pressure 136/63, pulse 60, temperature 98.3 F (36.8 C), temperature source Oral, height 5' 2.5" (1.588 m), weight 144 lb 8 oz (65.5 kg).    Review of Systems  Constitutional: Negative for fatigue and fever.  HENT: Negative for congestion.   Eyes: Negative for pain.  Respiratory: Negative for cough and shortness of breath.   Cardiovascular: Negative for chest pain, palpitations and leg swelling.  Gastrointestinal: Negative for abdominal pain.  Genitourinary: Negative for dysuria and vaginal bleeding.  Musculoskeletal: Negative for back pain.  Neurological: Positive for light-headedness. Negative for syncope and headaches.  Psychiatric/Behavioral: Negative for dysphoric mood.       Objective:   Physical Exam  Constitutional: She is oriented to person, place, and time. Vital signs are normal. She appears well-developed and well-nourished. She is cooperative.  Non-toxic appearance. She does not appear ill. No distress.  HENT:  Head: Normocephalic.  Right Ear: Hearing, tympanic membrane, external ear and ear canal normal. Tympanic membrane is not erythematous, not retracted and not bulging.  Left Ear: Hearing, tympanic membrane, external ear and ear canal normal. Tympanic membrane is not erythematous, not retracted and not bulging.  Nose: No mucosal edema or rhinorrhea. Right  sinus exhibits no maxillary sinus tenderness and no frontal sinus tenderness. Left sinus exhibits no maxillary sinus tenderness and no frontal sinus tenderness.  Mouth/Throat: Uvula is midline, oropharynx is clear and moist and mucous  membranes are normal.  Eyes: Pupils are equal, round, and reactive to light. Conjunctivae, EOM and lids are normal. Lids are everted and swept, no foreign bodies found.  Neck: Trachea normal and normal range of motion. Neck supple. Carotid bruit is not present. No thyroid mass and no thyromegaly present.  Cardiovascular: Normal rate, regular rhythm, S1 normal, S2 normal, normal heart sounds, intact distal pulses and normal pulses.  Exam reveals no gallop and no friction rub.   No murmur heard. Pulmonary/Chest: Effort normal and breath sounds normal. No tachypnea. No respiratory distress. She has no decreased breath sounds. She has no wheezes. She has no rhonchi. She has no rales.  Abdominal: Soft. Normal appearance and bowel sounds are normal. There is no tenderness.  Neurological: She is alert and oriented to person, place, and time. She has normal strength and normal reflexes. No cranial nerve deficit or sensory deficit. She exhibits normal muscle tone. She displays a negative Romberg sign. Coordination and gait normal. GCS eye subscore is 4. GCS verbal subscore is 5. GCS motor subscore is 6.  Nml cerebellar exam   No papilledema  Skin: Skin is warm, dry and intact. No [place] noted.  Psychiatric: She has a normal mood and affect. Her speech is normal and behavior is normal. Judgment and thought content normal. Her mood appears not anxious. Cognition and memory are normal. Cognition and memory are not impaired. She does not exhibit a depressed mood. She exhibits normal recent memory and normal remote memory.      Diabetic foot exam: Normal inspection No skin breakdown No calluses  Normal DP pulses Normal sensation to light touch and monofilament Nails normal     Assessment & Plan:  The patient's preventative maintenance and recommended screening tests for an annual wellness exam were reviewed in full today. Brought up to date unless services declined.  Counselled on the importance of  diet, exercise, and its role in overall health and mortality. The patient's FH and SH was reviewed, including their home life, tobacco status, and drug and alcohol status.    S/P hysterectomy  Plans on setting up mammogram and eye exam.   05/2016 DEXa.. Will repeat 2019

## 2017-05-22 ENCOUNTER — Other Ambulatory Visit: Payer: Self-pay | Admitting: Family Medicine

## 2017-05-22 DIAGNOSIS — Z1231 Encounter for screening mammogram for malignant neoplasm of breast: Secondary | ICD-10-CM

## 2017-06-01 ENCOUNTER — Other Ambulatory Visit: Payer: Self-pay | Admitting: Family Medicine

## 2017-06-05 ENCOUNTER — Ambulatory Visit
Admission: RE | Admit: 2017-06-05 | Discharge: 2017-06-05 | Disposition: A | Discharge status: Home / Self Care | Payer: Medicare Other | Source: Ambulatory Visit | Attending: Family Medicine | Admitting: Family Medicine

## 2017-06-05 ENCOUNTER — Other Ambulatory Visit: Payer: Self-pay | Admitting: Physician Assistant

## 2017-06-05 DIAGNOSIS — B9789 Other viral agents as the cause of diseases classified elsewhere: Secondary | ICD-10-CM

## 2017-06-05 DIAGNOSIS — Z1231 Encounter for screening mammogram for malignant neoplasm of breast: Secondary | ICD-10-CM | POA: Diagnosis not present

## 2017-06-05 DIAGNOSIS — J329 Chronic sinusitis, unspecified: Principal | ICD-10-CM

## 2017-06-19 ENCOUNTER — Other Ambulatory Visit: Payer: Self-pay | Admitting: Family Medicine

## 2017-07-03 ENCOUNTER — Other Ambulatory Visit: Payer: Self-pay | Admitting: Family Medicine

## 2017-07-16 DIAGNOSIS — Z23 Encounter for immunization: Secondary | ICD-10-CM | POA: Diagnosis not present

## 2017-07-18 ENCOUNTER — Other Ambulatory Visit: Payer: Self-pay | Admitting: Family Medicine

## 2017-08-30 ENCOUNTER — Other Ambulatory Visit: Payer: Self-pay | Admitting: Family Medicine

## 2017-09-02 ENCOUNTER — Other Ambulatory Visit: Payer: Self-pay | Admitting: *Deleted

## 2017-09-02 MED ORDER — OMEPRAZOLE 20 MG PO CPDR: DELAYED_RELEASE_CAPSULE | ORAL | 1 refills | Status: DC

## 2017-10-27 ENCOUNTER — Encounter (HOSPITAL_COMMUNITY): Payer: Self-pay | Admitting: Emergency Medicine

## 2017-10-27 ENCOUNTER — Emergency Department (HOSPITAL_COMMUNITY): Payer: Medicare Other

## 2017-10-27 ENCOUNTER — Other Ambulatory Visit: Payer: Self-pay

## 2017-10-27 ENCOUNTER — Observation Stay (HOSPITAL_COMMUNITY)
Admission: EM | Admit: 2017-10-27 | Discharge: 2017-10-29 | Disposition: A | Discharge status: Home / Self Care | Payer: Medicare Other | Attending: Internal Medicine | Admitting: Internal Medicine

## 2017-10-27 DIAGNOSIS — E1121 Type 2 diabetes mellitus with diabetic nephropathy: Secondary | ICD-10-CM | POA: Diagnosis not present

## 2017-10-27 DIAGNOSIS — N183 Chronic kidney disease, stage 3 unspecified: Secondary | ICD-10-CM | POA: Diagnosis present

## 2017-10-27 DIAGNOSIS — F17210 Nicotine dependence, cigarettes, uncomplicated: Secondary | ICD-10-CM | POA: Diagnosis not present

## 2017-10-27 DIAGNOSIS — R079 Chest pain, unspecified: Principal | ICD-10-CM

## 2017-10-27 DIAGNOSIS — Z79899 Other long term (current) drug therapy: Secondary | ICD-10-CM | POA: Diagnosis not present

## 2017-10-27 DIAGNOSIS — R0602 Shortness of breath: Secondary | ICD-10-CM | POA: Diagnosis not present

## 2017-10-27 DIAGNOSIS — Z720 Tobacco use: Secondary | ICD-10-CM

## 2017-10-27 DIAGNOSIS — Z7984 Long term (current) use of oral hypoglycemic drugs: Secondary | ICD-10-CM | POA: Insufficient documentation

## 2017-10-27 DIAGNOSIS — E039 Hypothyroidism, unspecified: Secondary | ICD-10-CM | POA: Diagnosis not present

## 2017-10-27 DIAGNOSIS — Z7982 Long term (current) use of aspirin: Secondary | ICD-10-CM | POA: Diagnosis not present

## 2017-10-27 DIAGNOSIS — I10 Essential (primary) hypertension: Secondary | ICD-10-CM | POA: Diagnosis not present

## 2017-10-27 DIAGNOSIS — R072 Precordial pain: Secondary | ICD-10-CM | POA: Diagnosis not present

## 2017-10-27 DIAGNOSIS — I152 Hypertension secondary to endocrine disorders: Secondary | ICD-10-CM | POA: Diagnosis present

## 2017-10-27 DIAGNOSIS — R0789 Other chest pain: Secondary | ICD-10-CM | POA: Diagnosis not present

## 2017-10-27 DIAGNOSIS — E1122 Type 2 diabetes mellitus with diabetic chronic kidney disease: Secondary | ICD-10-CM | POA: Diagnosis not present

## 2017-10-27 DIAGNOSIS — M545 Low back pain, unspecified: Secondary | ICD-10-CM

## 2017-10-27 DIAGNOSIS — I129 Hypertensive chronic kidney disease with stage 1 through stage 4 chronic kidney disease, or unspecified chronic kidney disease: Secondary | ICD-10-CM | POA: Insufficient documentation

## 2017-10-27 HISTORY — DX: Chronic kidney disease, unspecified: N18.9

## 2017-10-27 LAB — CBC
HCT: 35.6 % — ABNORMAL LOW (ref 36.0–46.0)
HEMOGLOBIN: 11.7 g/dL — AB (ref 12.0–15.0)
MCH: 28 pg (ref 26.0–34.0)
MCHC: 32.9 g/dL (ref 30.0–36.0)
MCV: 85.2 fL (ref 78.0–100.0)
PLATELETS: 222 10*3/uL (ref 150–400)
RBC: 4.18 MIL/uL (ref 3.87–5.11)
RDW: 13.1 % (ref 11.5–15.5)
WBC: 6.6 10*3/uL (ref 4.0–10.5)

## 2017-10-27 LAB — BASIC METABOLIC PANEL
ANION GAP: 10 (ref 5–15)
BUN: 14 mg/dL (ref 6–20)
CO2: 25 mmol/L (ref 22–32)
CREATININE: 1.31 mg/dL — AB (ref 0.44–1.00)
Calcium: 10.1 mg/dL (ref 8.9–10.3)
Chloride: 103 mmol/L (ref 101–111)
GFR calc non Af Amer: 41 mL/min — ABNORMAL LOW (ref >60)
GFR, EST AFRICAN AMERICAN: 47 mL/min — AB (ref >60)
Glucose, Bld: 114 mg/dL — ABNORMAL HIGH (ref 65–99)
Potassium: 3.9 mmol/L (ref 3.5–5.1)
SODIUM: 138 mmol/L (ref 135–145)

## 2017-10-27 LAB — GLUCOSE, CAPILLARY
GLUCOSE-CAPILLARY: 120 mg/dL — AB (ref 65–99)
Glucose-Capillary: 126 mg/dL — ABNORMAL HIGH (ref 65–99)

## 2017-10-27 LAB — TROPONIN I
Troponin I: <0.03 ng/mL (ref <0.03)
Troponin I: <0.03 ng/mL (ref <0.03)

## 2017-10-27 IMAGING — DX DG CHEST 2V
2 series · 2 of 2 positions shown · non-contrast
Comparison: No priors.

CLINICAL DATA: 66-year-old female with history of chest pressure
and shortness of breath today.

EXAM:
CHEST  2 VIEW

[chest pa]
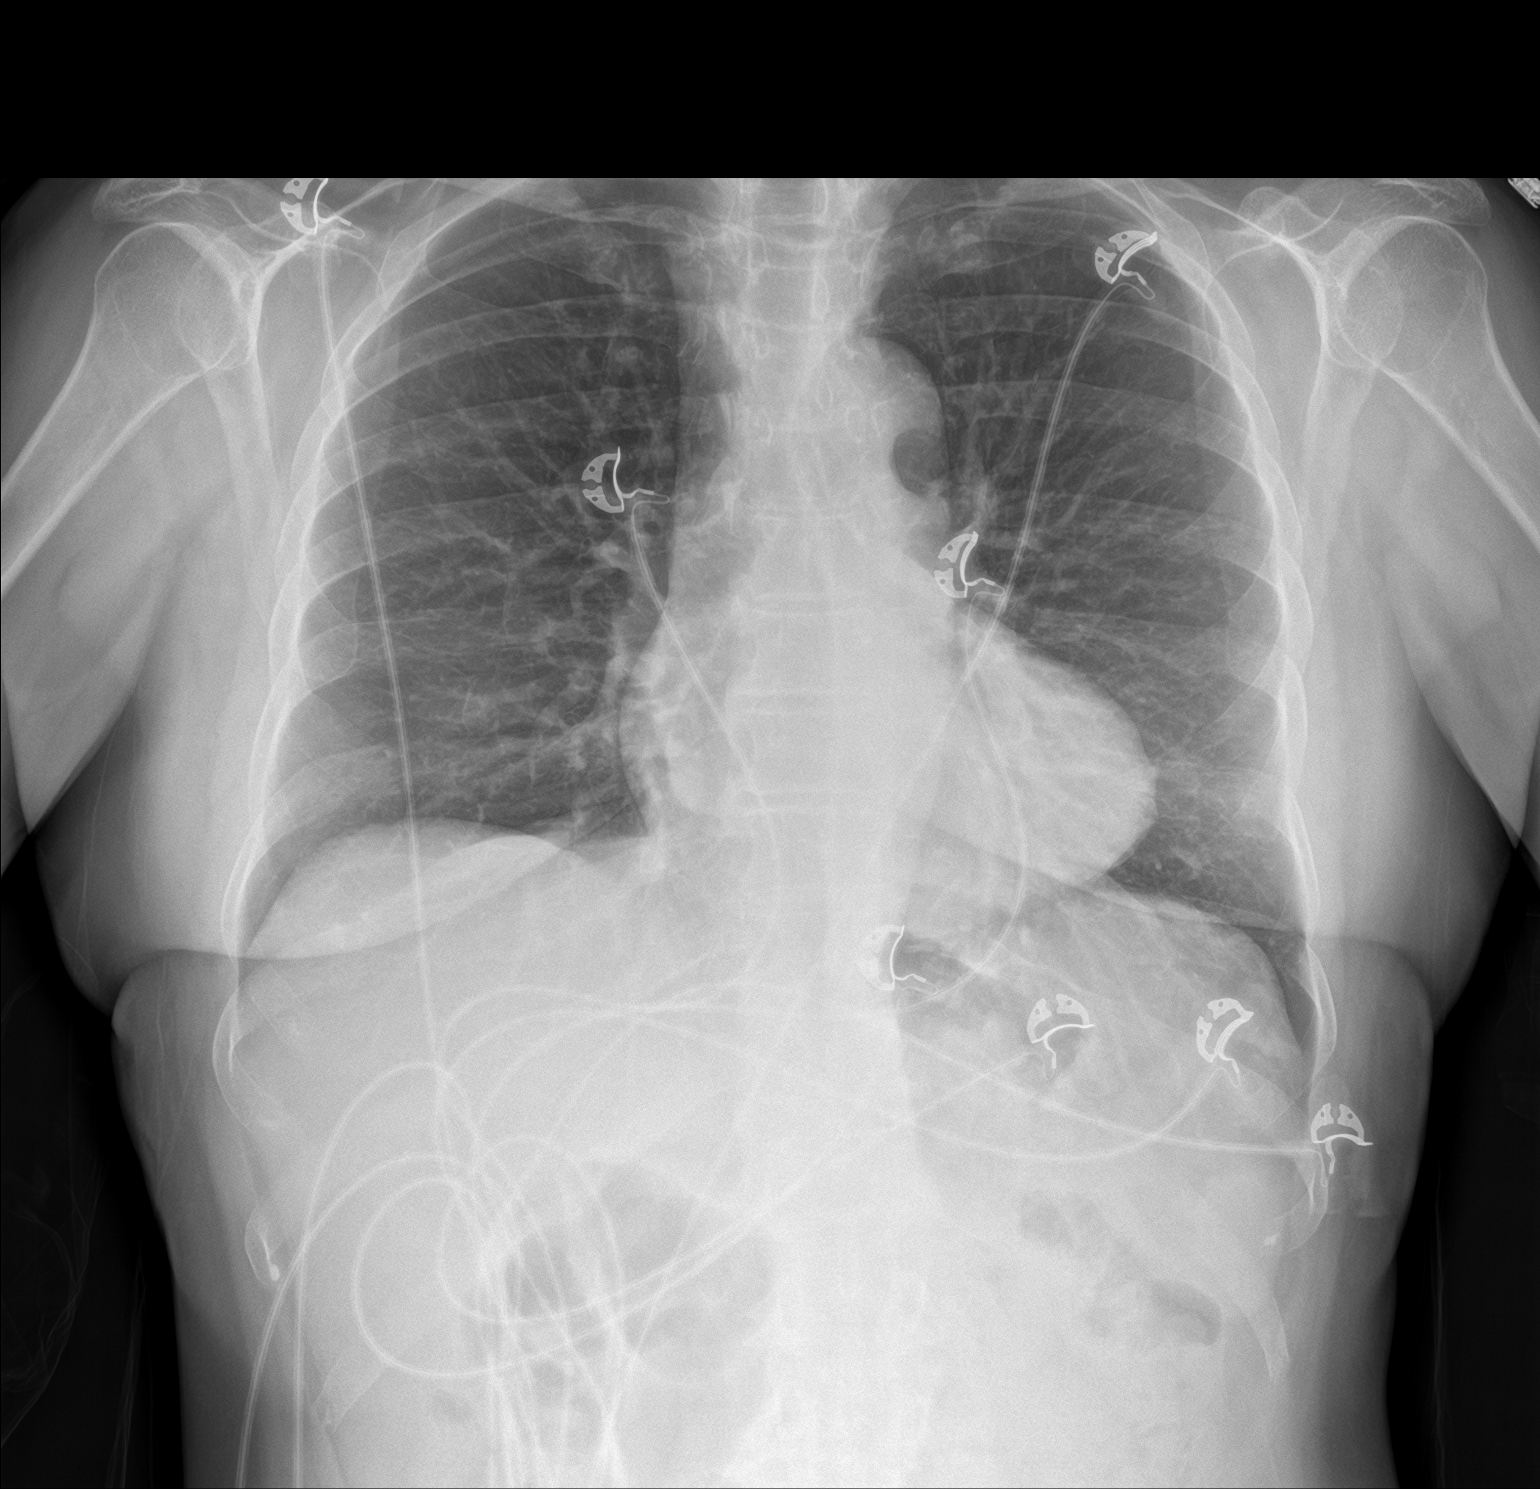

[chest lat]
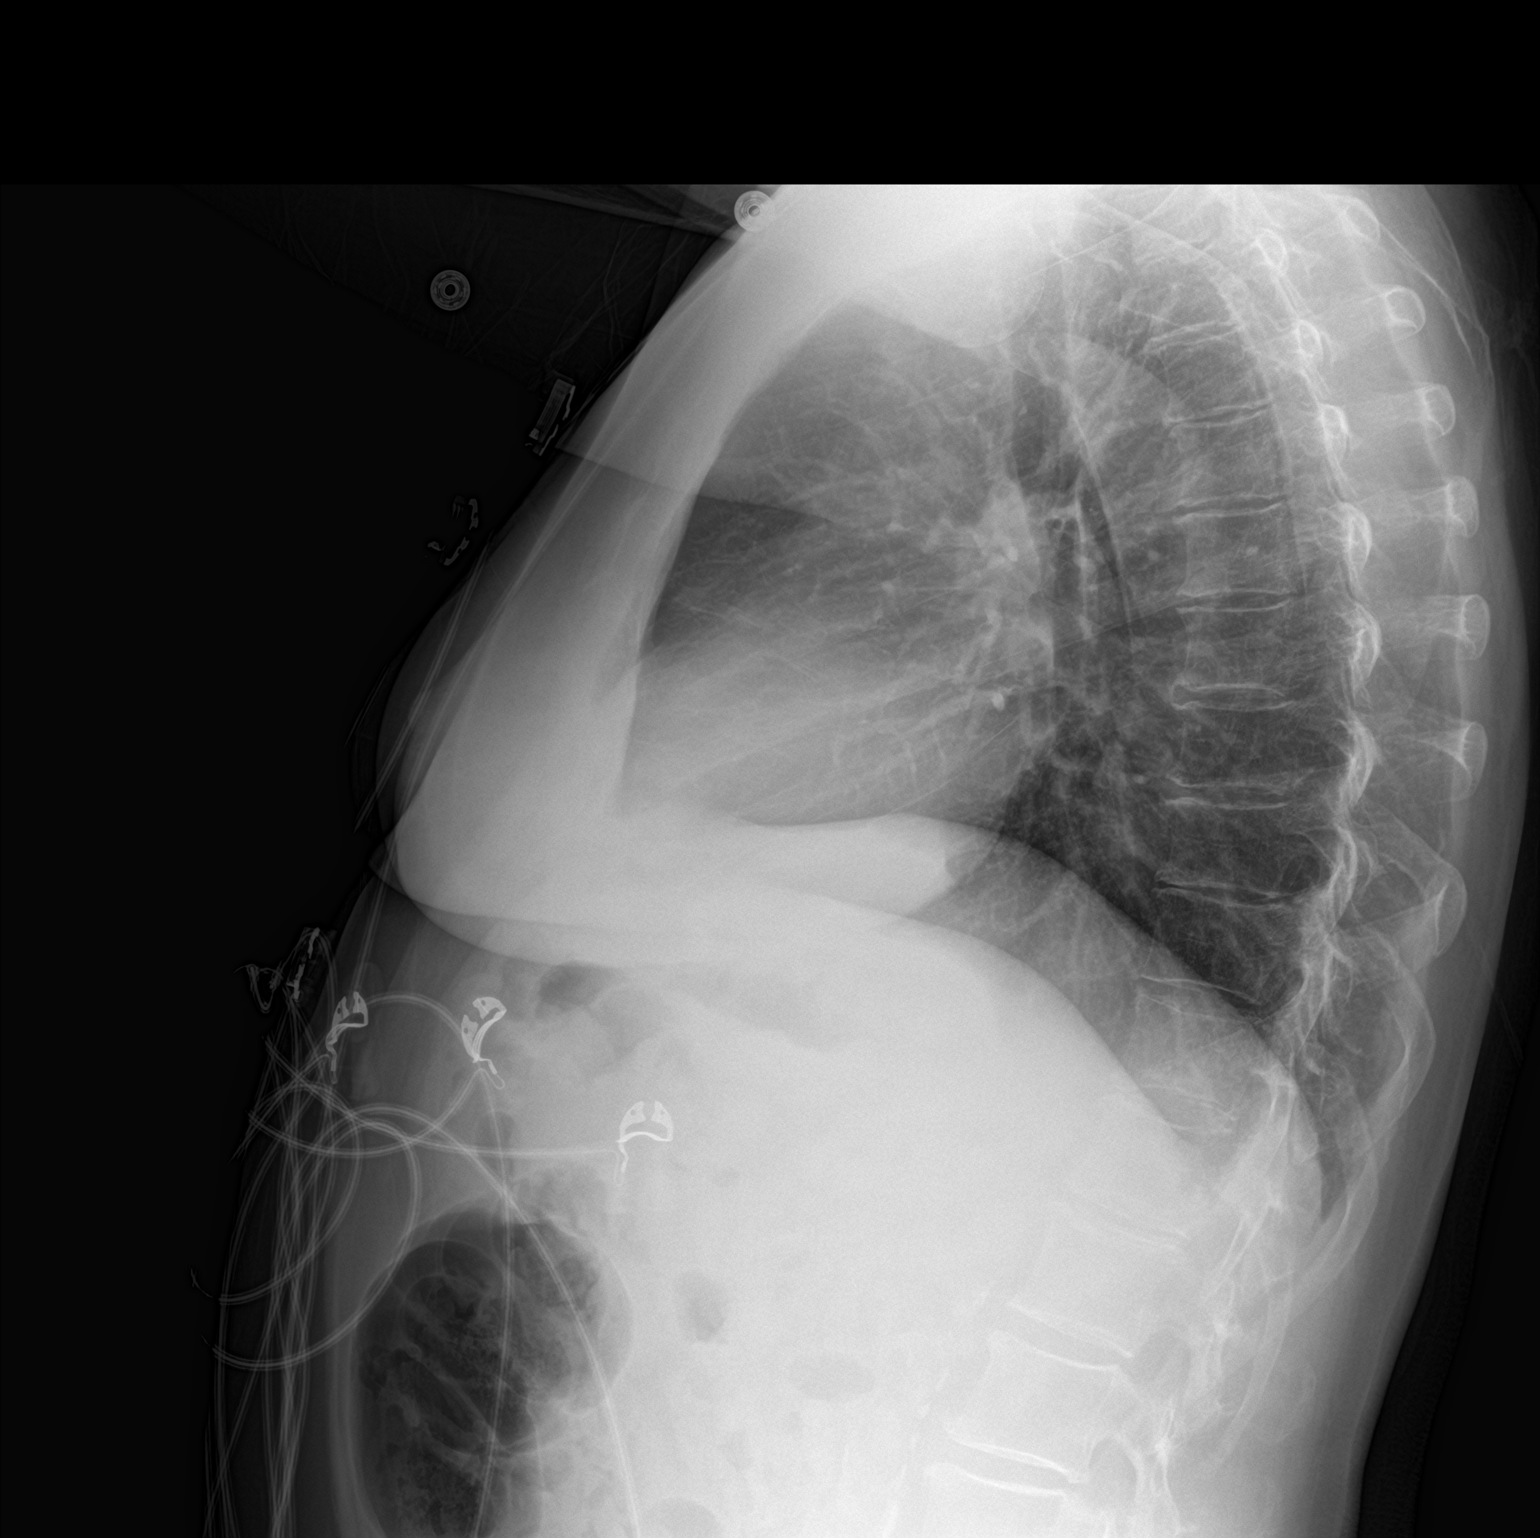

[2 of 2 positions shown; findings below may reference images not displayed]

FINDINGS: Lung volumes are normal. No consolidative airspace disease. No
pleural effusions. No pneumothorax. No pulmonary nodule or mass
noted. Pulmonary vasculature and the cardiomediastinal silhouette
are within normal limits.
IMPRESSION: No radiographic evidence of acute cardiopulmonary disease.

## 2017-10-27 MED ORDER — ROSUVASTATIN CALCIUM 20 MG PO TABS
20.0000 mg | ORAL_TABLET | Freq: Every day | ORAL | Status: DC
  Filled 2017-10-27: qty 1

## 2017-10-27 MED ORDER — LEVOTHYROXINE SODIUM 88 MCG PO TABS
88.0000 ug | ORAL_TABLET | Freq: Every day | ORAL | Status: DC
  Administered 2017-10-28 – 2017-10-29 (×2): 88 ug via ORAL
  Filled 2017-10-27 (×3): qty 1

## 2017-10-27 MED ORDER — ACETAMINOPHEN 650 MG RE SUPP: 650.0000 mg | Freq: Four times a day (QID) | RECTAL | Status: DC | PRN

## 2017-10-27 MED ORDER — PANTOPRAZOLE SODIUM 40 MG PO TBEC
40.0000 mg | DELAYED_RELEASE_TABLET | Freq: Every day | ORAL | Status: DC
  Administered 2017-10-28 – 2017-10-29 (×3): 40 mg via ORAL
  Filled 2017-10-27 (×3): qty 1

## 2017-10-27 MED ORDER — AMLODIPINE BESYLATE 5 MG PO TABS
5.0000 mg | ORAL_TABLET | Freq: Every day | ORAL | Status: DC
  Administered 2017-10-27 – 2017-10-29 (×3): 5 mg via ORAL
  Filled 2017-10-27 (×3): qty 1

## 2017-10-27 MED ORDER — CALCIUM CARBONATE-VITAMIN D 500-200 MG-UNIT PO TABS: 1.0000 | ORAL_TABLET | Freq: Two times a day (BID) | ORAL | Status: DC

## 2017-10-27 MED ORDER — CALCIUM CARBONATE-VITAMIN D 500-200 MG-UNIT PO TABS
1.0000 | ORAL_TABLET | Freq: Two times a day (BID) | ORAL | Status: DC
  Administered 2017-10-27 – 2017-10-29 (×4): 1 via ORAL
  Filled 2017-10-27 (×4): qty 1

## 2017-10-27 MED ORDER — ZOLPIDEM TARTRATE 5 MG PO TABS: 5.0000 mg | ORAL_TABLET | Freq: Every evening | ORAL | Status: DC | PRN

## 2017-10-27 MED ORDER — ASPIRIN EC 81 MG PO TBEC: 81.0000 mg | DELAYED_RELEASE_TABLET | Freq: Every day | ORAL | Status: DC

## 2017-10-27 MED ORDER — HYDRALAZINE HCL 20 MG/ML IJ SOLN: 10.0000 mg | Freq: Four times a day (QID) | INTRAMUSCULAR | Status: DC | PRN

## 2017-10-27 MED ORDER — TRAMADOL HCL 50 MG PO TABS
50.0000 mg | ORAL_TABLET | ORAL | Status: DC | PRN
  Administered 2017-10-27 – 2017-10-28 (×2): 50 mg via ORAL
  Filled 2017-10-27 (×2): qty 1

## 2017-10-27 MED ORDER — LEVOTHYROXINE SODIUM 88 MCG PO TABS: 88.0000 ug | ORAL_TABLET | Freq: Every day | ORAL | Status: DC

## 2017-10-27 MED ORDER — INSULIN ASPART 100 UNIT/ML ~~LOC~~ SOLN
0.0000 [IU] | Freq: Three times a day (TID) | SUBCUTANEOUS | Status: DC
  Administered 2017-10-27 – 2017-10-28 (×2): 1 [IU] via SUBCUTANEOUS
  Administered 2017-10-28: 2 [IU] via SUBCUTANEOUS

## 2017-10-27 MED ORDER — ROSUVASTATIN CALCIUM 20 MG PO TABS: 20.0000 mg | ORAL_TABLET | Freq: Every day | ORAL | Status: DC

## 2017-10-27 MED ORDER — BENAZEPRIL HCL 10 MG PO TABS
20.0000 mg | ORAL_TABLET | Freq: Every day | ORAL | Status: DC
  Administered 2017-10-28 – 2017-10-29 (×2): 20 mg via ORAL
  Filled 2017-10-27 (×3): qty 2

## 2017-10-27 MED ORDER — ASPIRIN EC 81 MG PO TBEC
81.0000 mg | DELAYED_RELEASE_TABLET | Freq: Every day | ORAL | Status: DC
  Administered 2017-10-27 – 2017-10-28 (×2): 81 mg via ORAL
  Filled 2017-10-27 (×2): qty 1

## 2017-10-27 MED ORDER — ROSUVASTATIN CALCIUM 20 MG PO TABS
20.0000 mg | ORAL_TABLET | Freq: Every day | ORAL | Status: DC
  Administered 2017-10-27 – 2017-10-28 (×2): 20 mg via ORAL
  Filled 2017-10-27 (×2): qty 1

## 2017-10-27 MED ORDER — CALCIUM CARBONATE-VITAMIN D 500-200 MG-UNIT PO TABS: 1.0000 | ORAL_TABLET | Freq: Every day | ORAL | Status: DC

## 2017-10-27 MED ORDER — LORATADINE 10 MG PO TABS
10.0000 mg | ORAL_TABLET | Freq: Every day | ORAL | Status: DC
Start: 2017-10-27 — End: 2017-10-27

## 2017-10-27 MED ORDER — ONDANSETRON HCL 4 MG/2ML IJ SOLN: 4.0000 mg | Freq: Four times a day (QID) | INTRAMUSCULAR | Status: DC | PRN

## 2017-10-27 MED ORDER — FLUTICASONE PROPIONATE 50 MCG/ACT NA SUSP
2.0000 | Freq: Every day | NASAL | Status: DC
  Administered 2017-10-29: 2 via NASAL
  Filled 2017-10-27: qty 16

## 2017-10-27 MED ORDER — TRAZODONE HCL 50 MG PO TABS
100.0000 mg | ORAL_TABLET | Freq: Every day | ORAL | Status: DC
  Administered 2017-10-27 – 2017-10-28 (×2): 100 mg via ORAL
  Filled 2017-10-27 (×2): qty 2

## 2017-10-27 MED ORDER — LORATADINE 10 MG PO TABS
10.0000 mg | ORAL_TABLET | Freq: Every day | ORAL | Status: DC
  Administered 2017-10-27 – 2017-10-28 (×2): 10 mg via ORAL
  Filled 2017-10-27 (×2): qty 1

## 2017-10-27 MED ORDER — ONDANSETRON HCL 4 MG PO TABS: 4.0000 mg | ORAL_TABLET | Freq: Four times a day (QID) | ORAL | Status: DC | PRN

## 2017-10-27 MED ORDER — ENOXAPARIN SODIUM 40 MG/0.4ML ~~LOC~~ SOLN
40.0000 mg | SUBCUTANEOUS | Status: DC
  Administered 2017-10-27 – 2017-10-28 (×2): 40 mg via SUBCUTANEOUS
  Filled 2017-10-27 (×2): qty 0.4

## 2017-10-27 MED ORDER — ACETAMINOPHEN 325 MG PO TABS: 650.0000 mg | ORAL_TABLET | Freq: Four times a day (QID) | ORAL | Status: DC | PRN

## 2017-10-27 MED ORDER — FLUOXETINE HCL 20 MG PO CAPS
20.0000 mg | ORAL_CAPSULE | Freq: Every day | ORAL | Status: DC
  Administered 2017-10-28 – 2017-10-29 (×2): 20 mg via ORAL
  Filled 2017-10-27 (×3): qty 1

## 2017-10-27 NOTE — H&P (Signed)
History and Physical  [name] QIO:[id number] DOB: [birth year] DOA: 10/27/2017   PCP: Jinny Sanders, MD   Patient coming from: Home  Chief Complaint: chest pain  HPI:  April Ross is a 67 y.o. female with medical history of diabetes mellitus, hypertension, hyperlipidemia, tobacco abuse, CKD stage III, hypothyroidism, depression/anxiety presented with 5-day history of intermittent chest pressure that is substernal that radiates to her left arm.  The patient states that this can happen with rest as well as with exertion.  She stated that it was brought on this past week with vacuuming her house.  She states that last a few minutes and then resolve spontaneously.  She has some associated shortness of breath.  On the morning of 10/27/2017, the patient was in the shower when she had chest discomfort associated with shortness of breath and dizziness with nausea.  As result, she presents emergency department for further evaluation.  The patient has been taking aspirin 81 mg daily.  She denies any new medications.  She denies any recent long trips.  She denies any fevers, chills, coughing, hemoptysis.  In the emergency department, the patient was afebrile hemodynamically stable saturating 100% on room air.  BMP and CBC were essentially unremarkable.  Her serum creatinine was at baseline.  Initial troponin was negative.  EKG shows sinus rhythm with left bundle branch block.  Chest x-ray was negative.  Assessment/Plan: Chest pain -Mostly atypical by history -Cycle troponins -Echocardiogram -Lipid panel -Hemoglobin A1c -continue ASA  Diabetes mellitus type 2 -Holding metformin -NovoLog sliding scale -Hemoglobin A1c  Hyperlipidemia -Continue statin  Essential hypertension -Continue benazepril -May need to add an additional agent  CKD stage III -Baseline creatinine 1.2-1.5 -a.m. BMP  Depression/anxiety -Continue fluoxetine and trazodone  Hypothyroidism -Continue  Synthroid  Tobacco abuse -She has a 10-pack-year history -Tobacco cessation discussed     Past Medical History:  Diagnosis Date  . Allergy   . Arthritis   . CRD (chronic renal disease)   . Depression   . Diabetes mellitus without complication ([place])   . GERD (gastroesophageal reflux disease)   . Hyperlipidemia   . Hypertension   . polyps in colon   . Thyroid disease   . Urinary tract infection    Past Surgical History:  Procedure Laterality Date  . ABDOMINAL HYSTERECTOMY    . APPENDECTOMY     Social History:  reports that she has been smoking cigarettes.  She has been smoking about 0.50 packs per day. she has never used smokeless tobacco. She reports that she drinks alcohol. She reports that she does not use drugs.   Family History  Problem Relation Age of Onset  . Alzheimer's disease Mother   . Hyperlipidemia Mother   . Hypertension Mother   . Asthma Maternal Grandmother   . Diabetes Maternal Grandfather   . Alcohol abuse Maternal Grandfather   . Hyperlipidemia Maternal Grandfather   . Stroke Maternal Grandfather   . Hypertension Maternal Grandfather   . Diabetes Paternal Grandmother   . Alcohol abuse Maternal Uncle   . Cholecystitis Maternal Uncle   . Arthritis Maternal Uncle      Allergies  Allergen Reactions  . Acetaminophen   . Asa [Aspirin]   . Ibuprofen   . Other [place]    Mycolog-II & HRT Hormone patch     Prior to Admission medications   Medication Sig Start Date End Date Taking? Authorizing Provider  alendronate (FOSAMAX) 70 MG tablet TAKE 1 TABLET  EVERY 7 DAYS TAKE WITH A FULL GLASS OF WATER ON AN EMPTY STOMACH 05/23/17  Yes Bedsole, Amy E, MD  aspirin EC 81 MG tablet Take 81 mg by mouth daily.   Yes [provider]  benazepril (LOTENSIN) 20 MG tablet TAKE 1 TABLET BY MOUTH EVERY DAY 07/03/17  Yes Bedsole, Amy E, MD  Calcium Carb-Cholecalciferol (CALCIUM-VITAMIN D) 500-200 MG-UNIT tablet Take 1 tablet by mouth daily.   Yes [provider]  cetirizine (ZYRTEC) 10 MG tablet Take 10 mg by mouth at bedtime.   Yes [provider]  FLUoxetine (PROZAC) 20 MG capsule TAKE ONE CAPSULE BY MOUTH EVERY DAY 06/19/17  Yes Bedsole, Amy E, MD  fluticasone (FLONASE) 50 MCG/ACT nasal spray PLACE 2 SPRAYS INTO BOTH NOSTRILS DAILY. 06/05/17  Yes Bedsole, Amy E, MD  levothyroxine (SYNTHROID, LEVOTHROID) 88 MCG tablet Take 88 mcg by mouth daily. 12/16/15  Yes [provider]  metFORMIN (GLUCOPHAGE) 1000 MG tablet TAKE 1 TABLET BY MOUTH TWICE A DAY 09/01/17  Yes Bedsole, Amy E, MD  omeprazole (PRILOSEC) 20 MG capsule TAKE 1 CAPSULE BY MOUTH EVERY DAY 09/02/17  Yes Bedsole, Amy E, MD  rosuvastatin (CRESTOR) 20 MG tablet TAKE 1 TABLET (20 MG TOTAL) BY MOUTH DAILY. 07/18/17  Yes Bedsole, Amy E, MD  traMADol (ULTRAM) 50 MG tablet Take 1 tablet (50 mg total) by mouth every 4 (four) hours as needed for severe pain. 01/28/16  Yes Bedsole, Amy E, MD  traZODone (DESYREL) 100 MG tablet TAKE 1 TABLET BY MOUTH EVERYDAY AT BEDTIME 06/01/17  Yes Bedsole, Amy E, MD  zolpidem (AMBIEN CR) 6.25 MG CR tablet Take 6.25 mg by mouth at bedtime as needed for sleep.    [provider]    Review of Systems:  Constitutional:  No weight loss, night sweats, Fevers, chills, fatigue.  Head&Eyes: No headache.  No vision loss.  No eye pain or scotoma ENT:  No Difficulty swallowing,Tooth/dental problems,Sore throat,  No ear ache, post nasal drip,  Cardio-vascular:  No  Orthopnea, PND, swelling in lower extremities,   palpitations  GI:  No  abdominal pain, nausea, vomiting, diarrhea, loss of appetite, hematochezia, melena, heartburn, indigestion, Resp:   No cough. No coughing up of blood .No wheezing.No chest wall deformity  Skin:  no Dacus or lesions.  GU:  no dysuria, change in color of urine, no urgency or frequency. No flank pain.  Musculoskeletal:  No joint pain or swelling. No decreased range of motion. No back pain.  Psych:  No  change in mood or affect. No depression or anxiety. Neurologic: No headache, no dysesthesia, no focal weakness, no vision loss. No syncope  Physical Exam: Vitals:   10/27/17 1200 10/27/17 1230 10/27/17 1300 10/27/17 1330  BP: (!) 173/82 (!) 175/71 (!) 155/85 (!) 155/69  Pulse: 68 61 65 64  Resp: 14 15 20 11   Temp:      TempSrc:      SpO2: 99% 100% 95% 97%  Weight:      Height:       General:  A&O x 3, NAD, nontoxic, pleasant/cooperative Head/Eye: No conjunctival hemorrhage, no icterus, [place]/AT, No nystagmus ENT:  No icterus,  No thrush, good dentition, no pharyngeal exudate Neck:  No masses, no lymphadenpathy, no bruits CV:  RRR, no rub, no gallop, no S3 Lung:  CTAB, good air movement, no wheeze, no rhonchi Abdomen: soft/NT, +BS, nondistended, no peritoneal signs Ext: No cyanosis, No rashes, No petechiae, No lymphangitis, No edema Neuro: CNII-XII  intact, strength 4/5 in bilateral upper and lower extremities, no dysmetria  Labs on Admission:  Basic Metabolic Panel: Recent Labs  Lab 10/27/17 0955  NA 138  K 3.9  CL 103  CO2 25  GLUCOSE 114*  BUN 14  CREATININE 1.31*  CALCIUM 10.1   Liver Function Tests: No results for input(s): AST, ALT, ALKPHOS, BILITOT, PROT, ALBUMIN in the last 168 hours. No results for input(s): LIPASE, AMYLASE in the last 168 hours. No results for input(s): AMMONIA in the last 168 hours. CBC: Recent Labs  Lab 10/27/17 0955  WBC 6.6  HGB 11.7*  HCT 35.6*  MCV 85.2  PLT 222   Coagulation Profile: No results for input(s): INR, PROTIME in the last 168 hours. Cardiac Enzymes: Recent Labs  Lab 10/27/17 1223  TROPONINI <0.03   BNP: Invalid input(s): POCBNP CBG: No results for input(s): GLUCAP in the last 168 hours. Urine analysis: No results found for: COLORURINE, APPEARANCEUR, LABSPEC, PHURINE, GLUCOSEU, HGBUR, BILIRUBINUR, KETONESUR, PROTEINUR, UROBILINOGEN, NITRITE, LEUKOCYTESUR Sepsis  Labs: @LABRCNTIP (procalcitonin:4,lacticidven:4) )No results found for this or any previous visit (from the past 240 hour(s)).   Radiological Exams on Admission: Dg Chest 2 View  Result Date: 10/27/2017 CLINICAL DATA:  67 year old female with history of chest pressure and shortness of breath today. EXAM: CHEST  2 VIEW COMPARISON:  No priors. FINDINGS: Lung volumes are normal. No consolidative airspace disease. No pleural effusions. No pneumothorax. No pulmonary nodule or mass noted. Pulmonary vasculature and the cardiomediastinal silhouette are within normal limits. IMPRESSION: No radiographic evidence of acute cardiopulmonary disease. Electronically Signed   By: Vinnie Langton M.D.   On: 10/27/2017 10:39    EKG: Independently reviewed. Sinus, LBBB    Time spent:60 minutes Code Status:   FULL Family Communication:  No Family at bedside Disposition Plan: expect 1 day hospitalization Consults called: none DVT Prophylaxis: [place] Lovenox  Orson Eva, DO  [facility] Pager [phone]  If 7PM-7AM, please contact night-coverage [email or web address] Password [email or web address] 10/27/2017, 1:59 PM

## 2017-10-27 NOTE — ED Triage Notes (Signed)
Patient c/o intermittent, left sided chest heaviness that radiates into back x2-3 days. Per patient shortness of breath, nausea, and dizziness.

## 2017-10-27 NOTE — ED Notes (Signed)
Meal has been provided

## 2017-10-27 NOTE — ED Notes (Signed)
hospitalist into evaluate.

## 2017-10-27 NOTE — ED Provider Notes (Signed)
Emergency Department Provider Note   I have reviewed the triage vital signs and the nursing notes.   HISTORY  Chief Complaint Chest Pain   HPI April Ross is a 67 y.o. female with a history of diabetes, hyperlipidemia, hypertension and is a current smoker the presents to the emergency department today with left-sided chest pressure that seems to radiate to her upper back associate with shortness of breath, nausea and lightheadedness.  States they do seem to get worse with exertion and better with rest but has not tried any else for the symptoms.  Has not had any recent fever but did just get over sinusitis and cough which seemed to be improving.  No trauma.  No weakness or numbness in her legs or arms. No other associated or modifying symptoms.    Past Medical History:  Diagnosis Date  . Allergy   . Arthritis   . CRD (chronic renal disease)   . Depression   . Diabetes mellitus without complication ([place])   . GERD (gastroesophageal reflux disease)   . Hyperlipidemia   . Hypertension   . polyps in colon   . Thyroid disease   . Urinary tract infection     Patient Active Problem List   Diagnosis Date Noted  . Chest pain 10/27/2017  . Type 2 diabetes mellitus with nephropathy ([place]) 10/27/2017  . Tobacco abuse 10/27/2017  . Nonspecific chest pain   . Intermittent lightheadedness 05/16/2017  . Osteoporosis 05/24/2016  . CKD (chronic kidney disease) stage 3, GFR 30-59 ml/min ([facility]) 05/08/2016  . Pure hypercholesterolemia 01/12/2016  . Controlled diabetes mellitus with diabetic nephropathy ([place]) 01/12/2016  . Hypothyroidism 01/12/2016  . Essential hypertension, benign 01/12/2016  . Allergic rhinitis 01/12/2016  . Moderate major depression, single episode ([place]) 01/12/2016  . Chronic insomnia 01/12/2016  . GERD (gastroesophageal reflux disease) 01/12/2016    Past Surgical History:  Procedure Laterality Date  . ABDOMINAL HYSTERECTOMY    . APPENDECTOMY         Allergies Acetaminophen; Asa [aspirin]; Ibuprofen; and Other  Family History  Problem Relation Age of Onset  . Alzheimer's disease Mother   . Hyperlipidemia Mother   . Hypertension Mother   . Asthma Maternal Grandmother   . Diabetes Maternal Grandfather   . Alcohol abuse Maternal Grandfather   . Hyperlipidemia Maternal Grandfather   . Stroke Maternal Grandfather   . Hypertension Maternal Grandfather   . Diabetes Paternal Grandmother   . Alcohol abuse Maternal Uncle   . Cholecystitis Maternal Uncle   . Arthritis Maternal Uncle     Social History Social History   Tobacco Use  . Smoking status: Current Some Day Smoker    Packs/day: 0.50    Types: Cigarettes  . Smokeless tobacco: Never Used  Substance Use Topics  . Alcohol use: Yes    Alcohol/week: 0.0 oz    Comment: occ glass of wine or mix drink  . Drug use: No    Review of Systems  All other systems negative except as documented in the HPI. All pertinent positives and negatives as reviewed in the HPI. ____________________________________________   PHYSICAL EXAM:  VITAL SIGNS: ED Triage Vitals  Enc Vitals Group     BP 10/27/17 0938 (!) 168/72     Pulse Rate 10/27/17 0939 68     Resp 10/27/17 0939 (!) 0     Temp 10/27/17 0940 98.5 F (36.9 C)     Temp Source 10/27/17 0940 Oral     SpO2 10/27/17 0939  97 %     Weight 10/27/17 0938 141 lb (64 kg)     Height 10/27/17 0938 5' 2.5" (1.588 m)    Constitutional: Alert and oriented. Well appearing and in no acute distress. Eyes: Conjunctivae are normal. PERRL. EOMI. Head: Atraumatic. Nose: No congestion/rhinnorhea. Mouth/Throat: Mucous membranes are moist.  Oropharynx non-erythematous. Neck: No stridor.  No meningeal signs.   Cardiovascular: Normal rate, regular rhythm. Good peripheral circulation. Grossly normal heart sounds.   Respiratory: Normal respiratory effort.  No retractions. Lungs CTAB. Gastrointestinal: Soft and nontender. No distention.   Musculoskeletal: No lower extremity tenderness nor edema. No gross deformities of extremities. Neurologic:  Normal speech and language. No gross focal neurologic deficits are appreciated.  Skin:  Skin is warm, dry and intact. No [place] noted.   ____________________________________________   LABS (all labs ordered are listed, but only abnormal results are displayed)  Labs Reviewed  BASIC METABOLIC PANEL - Abnormal; Notable for the following components:      Result Value   Glucose, Bld 114 (*)    Creatinine, Ser 1.31 (*)    GFR calc non Af Amer 41 (*)    GFR calc Af Amer 47 (*)    All other components within normal limits  CBC - Abnormal; Notable for the following components:   Hemoglobin 11.7 (*)    HCT 35.6 (*)    All other components within normal limits  TROPONIN I  TROPONIN I  TROPONIN I  I-STAT TROPONIN, ED   ____________________________________________  EKG   EKG Interpretation  Date/Time:  Sunday October 27 2017 09:40:47 EST Ventricular Rate:  60 PR Interval:    QRS Duration: 141 QT Interval:  461 QTC Calculation: 461 R Axis:   70 Text Interpretation:  Sinus rhythm IVCD, consider atypical LBBB Baseline wander in lead(s) II III aVF No old tracing to compare Confirmed by Merrily Pew [phone]) on 10/27/2017 10:19:28 AM      ____________________________________________  RADIOLOGY  Dg Chest 2 View  Result Date: 10/27/2017 CLINICAL DATA:  67 year old female with history of chest pressure and shortness of breath today. EXAM: CHEST  2 VIEW COMPARISON:  No priors. FINDINGS: Lung volumes are normal. No consolidative airspace disease. No pleural effusions. No pneumothorax. No pulmonary nodule or mass noted. Pulmonary vasculature and the cardiomediastinal silhouette are within normal limits. IMPRESSION: No radiographic evidence of acute cardiopulmonary disease. Electronically Signed   By: Vinnie Langton M.D.   On: 10/27/2017 10:39     ____________________________________________   PROCEDURES  Procedure(s) performed:   Procedures   ____________________________________________   INITIAL IMPRESSION / ASSESSMENT AND PLAN / ED COURSE  Many risk factors and a concerning story for possible cardiac etiologies.  States she had a stress test a while back but does not sound she is actually able to fully completed however no further follow-up was done after that.  She does have left bundle branch block on her EKG and I have no old one to compare to.  This is low risk for pulmonary embolus at this time however may need to be ruled out from a ACS standpoint if alternative etiology for her symptoms are not found on workup.   Pertinent labs & imaging results that were available during my care of the patient were reviewed by me and considered in my medical decision making (see chart for details).  ____________________________________________  FINAL CLINICAL IMPRESSION(S) / ED DIAGNOSES  Final diagnoses:  Nonspecific chest pain     MEDICATIONS GIVEN DURING THIS VISIT:  Medications  aspirin EC tablet 81 mg (not administered)  benazepril (LOTENSIN) tablet 20 mg (20 mg Oral Not Given 10/27/17 1453)  loratadine (CLARITIN) tablet 10 mg (not administered)  FLUoxetine (PROZAC) capsule 20 mg (not administered)  fluticasone (FLONASE) 50 MCG/ACT nasal spray 2 spray (2 sprays Each Nare Not Given 10/27/17 1538)  rosuvastatin (CRESTOR) tablet 20 mg (not administered)  pantoprazole (PROTONIX) EC tablet 40 mg (40 mg Oral Not Given 10/27/17 1457)  traZODone (DESYREL) tablet 100 mg (not administered)  zolpidem (AMBIEN) tablet 5 mg (not administered)  traMADol (ULTRAM) tablet 50 mg (50 mg Oral Given 10/27/17 1507)  enoxaparin (LOVENOX) injection 40 mg (not administered)  acetaminophen (TYLENOL) tablet 650 mg (not administered)    Or  acetaminophen (TYLENOL) suppository 650 mg (not administered)  ondansetron (ZOFRAN) tablet 4 mg (not  administered)    Or  ondansetron (ZOFRAN) injection 4 mg (not administered)  insulin aspart (novoLOG) injection 0-9 Units (not administered)  levothyroxine (SYNTHROID, LEVOTHROID) tablet 88 mcg (not administered)  calcium-vitamin D (OSCAL WITH D) 500-200 MG-UNIT per tablet 1 tablet (not administered)  amLODipine (NORVASC) tablet 5 mg (not administered)  hydrALAZINE (APRESOLINE) injection 10 mg (not administered)     NEW OUTPATIENT MEDICATIONS STARTED DURING THIS VISIT:  Current Discharge Medication List      Note:  This note was prepared with assistance of Dragon voice recognition software. Occasional wrong-word or sound-a-like substitutions may have occurred due to the inherent limitations of voice recognition software.   Merrily Pew, MD 10/27/17 [phone]

## 2017-10-27 NOTE — ED Notes (Signed)
ED Provider at bedside. 

## 2017-10-27 NOTE — ED Notes (Signed)
Report to Lauren, RN

## 2017-10-27 NOTE — ED Notes (Signed)
Out of bed to BR- pt ambulates heel to toe somewhat unsteady but refuses assistance  Speaks of chest tightness that continues into her back

## 2017-10-28 ENCOUNTER — Observation Stay (HOSPITAL_COMMUNITY): Payer: Medicare Other

## 2017-10-28 DIAGNOSIS — E1121 Type 2 diabetes mellitus with diabetic nephropathy: Secondary | ICD-10-CM | POA: Diagnosis not present

## 2017-10-28 DIAGNOSIS — Z720 Tobacco use: Secondary | ICD-10-CM | POA: Diagnosis not present

## 2017-10-28 DIAGNOSIS — I10 Essential (primary) hypertension: Secondary | ICD-10-CM | POA: Diagnosis not present

## 2017-10-28 DIAGNOSIS — N183 Chronic kidney disease, stage 3 (moderate): Secondary | ICD-10-CM | POA: Diagnosis not present

## 2017-10-28 DIAGNOSIS — M545 Low back pain: Secondary | ICD-10-CM | POA: Diagnosis not present

## 2017-10-28 DIAGNOSIS — R079 Chest pain, unspecified: Secondary | ICD-10-CM | POA: Diagnosis not present

## 2017-10-28 DIAGNOSIS — E039 Hypothyroidism, unspecified: Secondary | ICD-10-CM | POA: Diagnosis not present

## 2017-10-28 LAB — GLUCOSE, CAPILLARY
GLUCOSE-CAPILLARY: 95 mg/dL (ref 65–99)
Glucose-Capillary: 132 mg/dL — ABNORMAL HIGH (ref 65–99)
Glucose-Capillary: 153 mg/dL — ABNORMAL HIGH (ref 65–99)

## 2017-10-28 LAB — LIPID PANEL
CHOLESTEROL: 161 mg/dL (ref 0–200)
HDL: 27 mg/dL — ABNORMAL LOW (ref >40)
LDL CALC: 61 mg/dL (ref 0–99)
Total CHOL/HDL Ratio: 6 RATIO
Triglycerides: 367 mg/dL — ABNORMAL HIGH (ref <150)
VLDL: 73 mg/dL — AB (ref 0–40)

## 2017-10-28 LAB — BASIC METABOLIC PANEL
Anion gap: 8 (ref 5–15)
BUN: 17 mg/dL (ref 6–20)
CO2: 28 mmol/L (ref 22–32)
CREATININE: 1.44 mg/dL — AB (ref 0.44–1.00)
Calcium: 10.2 mg/dL (ref 8.9–10.3)
Chloride: 102 mmol/L (ref 101–111)
GFR calc Af Amer: 42 mL/min — ABNORMAL LOW (ref >60)
GFR, EST NON AFRICAN AMERICAN: 36 mL/min — AB (ref >60)
Glucose, Bld: 125 mg/dL — ABNORMAL HIGH (ref 65–99)
Potassium: 3.7 mmol/L (ref 3.5–5.1)
SODIUM: 138 mmol/L (ref 135–145)

## 2017-10-28 LAB — HEMOGLOBIN A1C
HEMOGLOBIN A1C: 6.2 % — AB (ref 4.8–5.6)
MEAN PLASMA GLUCOSE: 131.24 mg/dL

## 2017-10-28 LAB — MAGNESIUM: Magnesium: 1.6 mg/dL — ABNORMAL LOW (ref 1.7–2.4)

## 2017-10-28 IMAGING — DX DG LUMBAR SPINE 2-3V
3 series · 3 of 3 positions shown · non-contrast
Comparison: None.

CLINICAL DATA: Left low back pain.

EXAM:
LUMBAR SPINE - 2-3 VIEW

[l-spine ap]
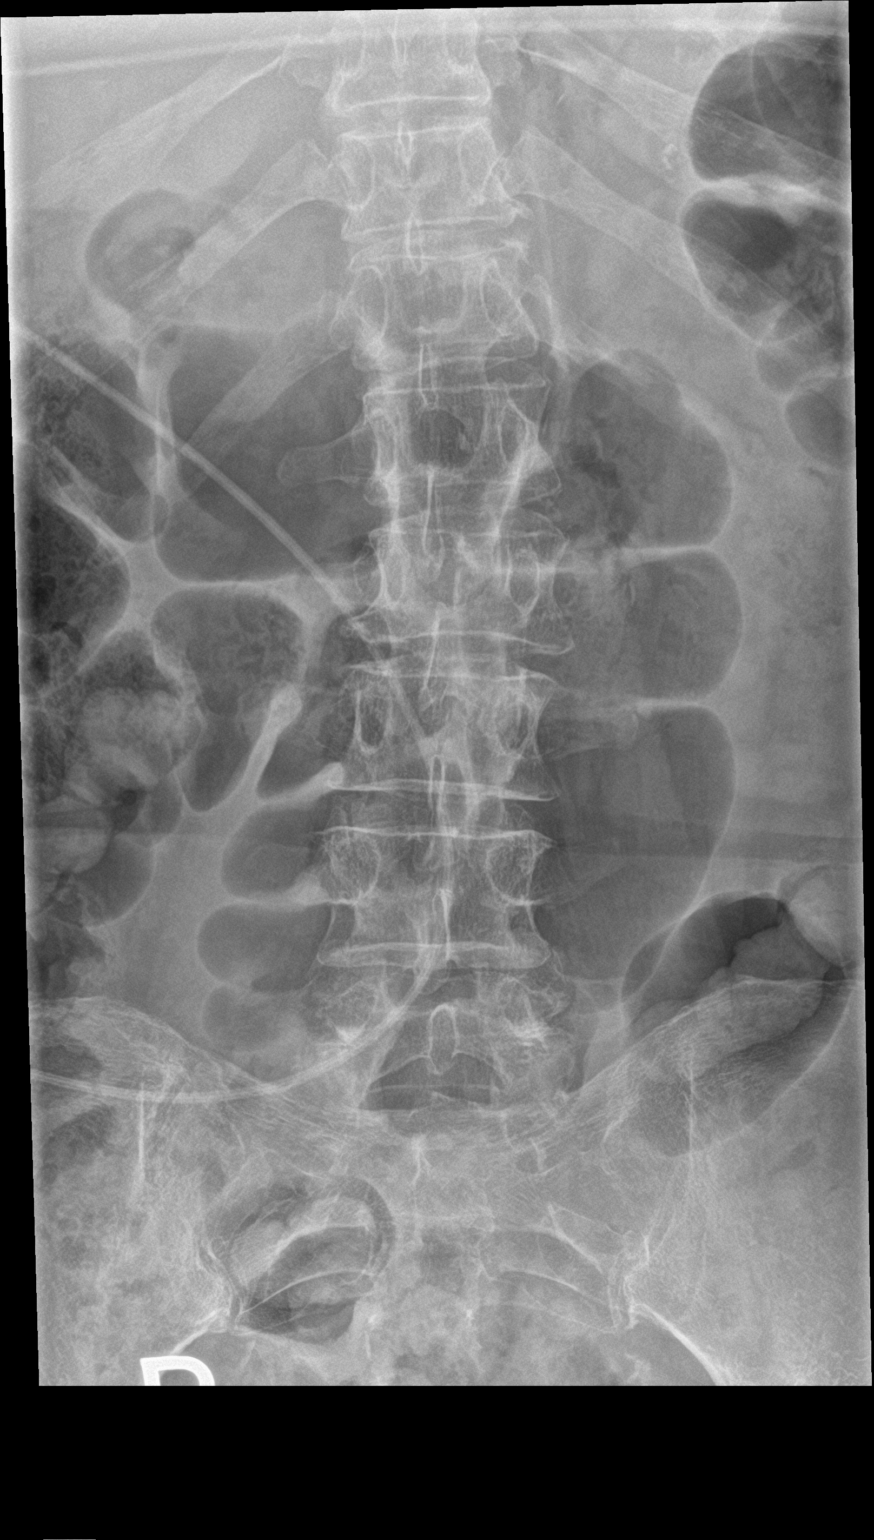

[l-spine lat]
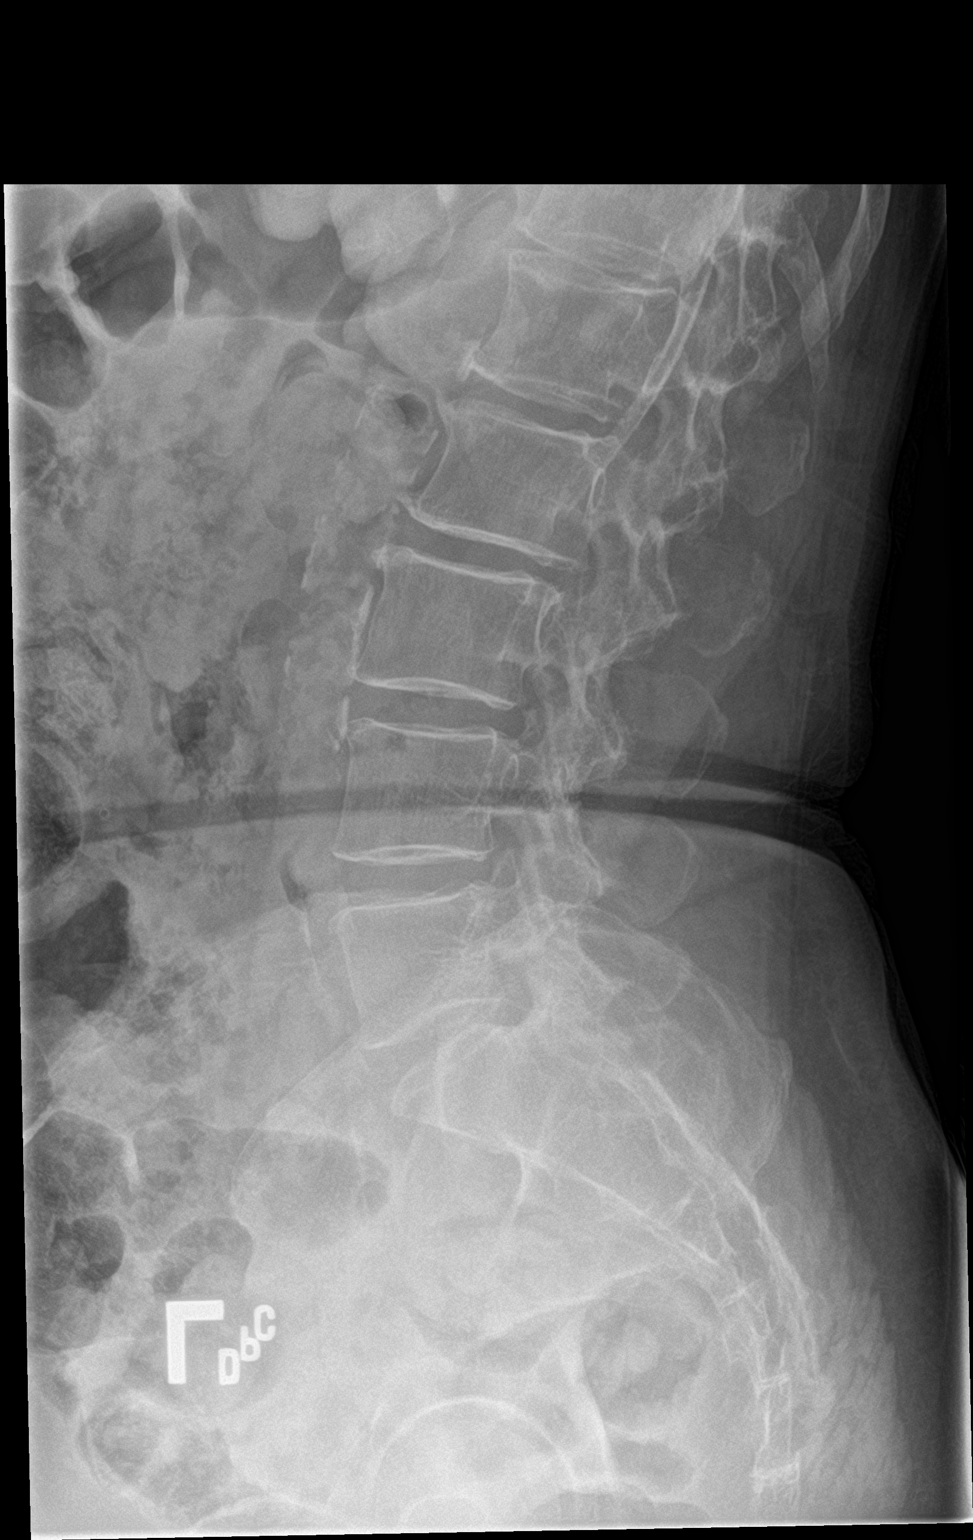

[l-spine spot]
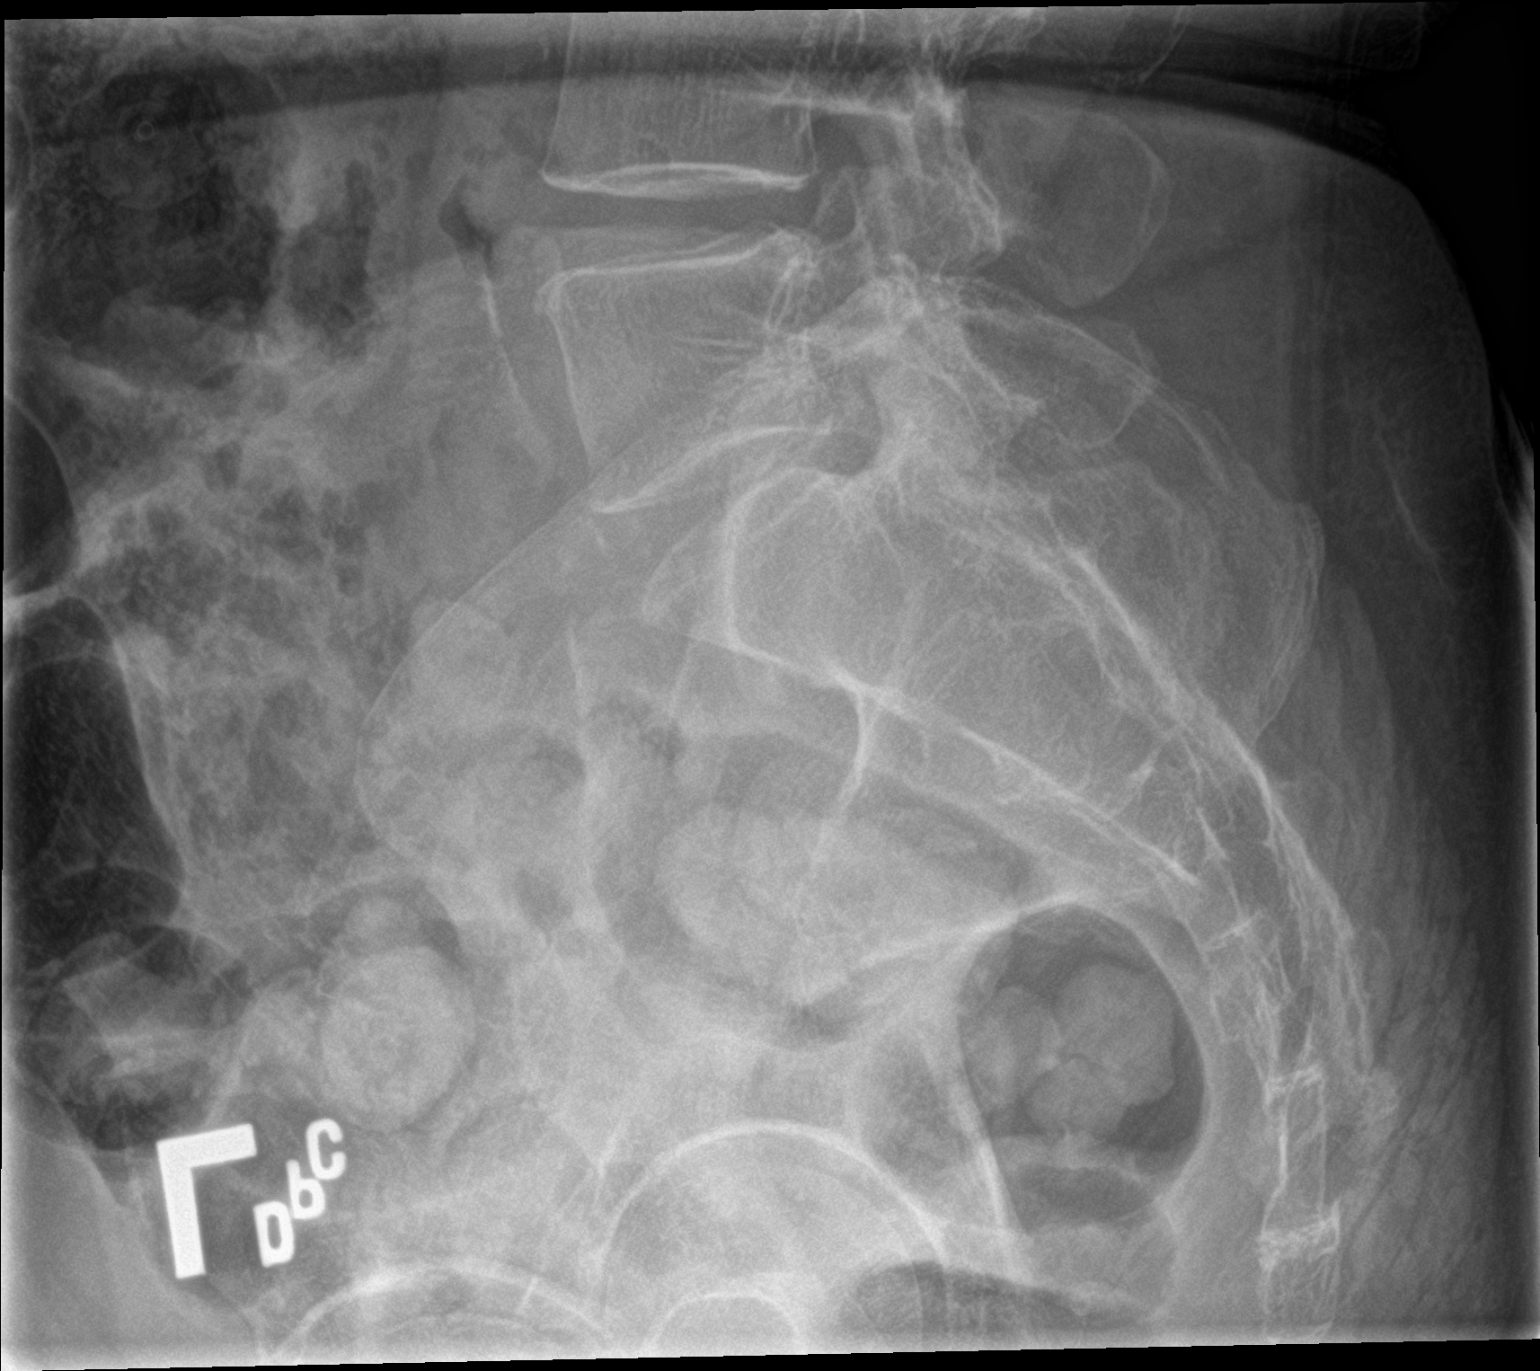

[3 of 3 positions shown; findings below may reference images not displayed]

FINDINGS: No fracture or subluxation. No significant degenerative disease with
mild osteophyte formation seen at several levels. No bony lesions.
Calcified plaque present in the abdominal aorta.
IMPRESSION: No fracture or significant degenerative disc disease. Aortic
atherosclerosis.

## 2017-10-28 MED ORDER — CYCLOBENZAPRINE HCL 10 MG PO TABS
10.0000 mg | ORAL_TABLET | Freq: Three times a day (TID) | ORAL | Status: DC | PRN
  Administered 2017-10-28: 10 mg via ORAL
  Filled 2017-10-28: qty 1

## 2017-10-28 MED ORDER — MAGNESIUM SULFATE 2 GM/50ML IV SOLN
2.0000 g | Freq: Once | INTRAVENOUS | Status: AC
  Administered 2017-10-28: 2 g via INTRAVENOUS
  Filled 2017-10-28: qty 50

## 2017-10-28 NOTE — Progress Notes (Signed)
PROGRESS NOTE  April Ross NLG:[id number] DOB: October 27, 1950 DOA: 10/27/2017 PCP: Jinny Sanders, MD  Brief History:  67 y.o. female with medical history of diabetes mellitus, hypertension, hyperlipidemia, tobacco abuse, CKD stage III, hypothyroidism, depression/anxiety presented with 5-day history of intermittent chest pressure that is substernal that radiates to her left arm.  The patient states that this can happen with rest as well as with exertion.  She stated that it was brought on this past week with vacuuming her house.  She states that last a few minutes and then resolve spontaneously.  She has some associated shortness of breath.  On the morning of 10/27/2017, the patient was in the shower when she had chest discomfort associated with shortness of breath and dizziness with nausea.  As result, she presents emergency department for further evaluation.  Cardiology was consulted to assist with management    Assessment/Plan: Chest pain -pt has typical and atypical components -Cycle troponins--neg x 3 -Echocardiogram--pending -Lipid panel--LDL 61 -Hemoglobin A1c--6.2 -continue ASA -cardiology consult  Diabetes mellitus type 2 -Holding metformin -Hemoglobin A1c--6.2 -d/c sliding scale  Hyperlipidemia -Continue statin  Essential hypertension -Continue benazepril -add amlodipine  CKD stage III -Baseline creatinine 1.2-1.5 -a.m. BMP  Depression/anxiety -Continue fluoxetine and trazodone  Hypothyroidism -Continue Synthroid  Tobacco abuse -She has a 10-pack-year history -Tobacco cessation discussed  Hypomagnesemia -replete  Low Back pain -musculoskeletal -start flexeril         Disposition Plan:   Home when cleared by cardiology Family Communication:  Daughter and mother updated at bedside 1/21  Consultants:  cardiology  Code Status:  FULL  DVT Prophylaxis:  [place] Lovenox   Procedures: As Listed in Progress Note  Above  Antibiotics: None    Subjective: Patient currently denies any chest pressure chest discomfort patient continues to complain of left-sided back pain.  She denies any nausea, vomiting, shortness breath, headache, fever, chills, coughing, hemoptysis.  She was able to walk around the nursing station yesterday without any chest discomfort or shortness of breath.  Objective: Vitals:   10/27/17 1439 10/27/17 2132 10/28/17 0500 10/28/17 1315  BP: (!) 186/75 (!) 146/50 (!) 127/54 (!) 134/58  Pulse: 63 (!) 54 (!) 52 (!) 58  Resp: 17 17 18 18   Temp: 99.6 F (37.6 C) 98.5 F (36.9 C) 98.2 F (36.8 C) 98.4 F (36.9 C)  TempSrc: Oral Oral Oral Oral  SpO2: 100% 97% 96% 97%  Weight: 64.6 kg (142 lb 6.4 oz)     Height: 5\' 2"  (1.575 m)       Intake/Output Summary (Last 24 hours) at 10/28/2017 1642 Last data filed at 10/28/2017 1300 Gross per 24 hour  Intake 840 ml  Output -  Net 840 ml   Weight change:  Exam:   General:  Pt is alert, follows commands appropriately, not in acute distress  HEENT: No icterus, No thrush, No neck mass, [place]/AT  Cardiovascular: RRR, S1/S2, no rubs, no gallops  Respiratory: Bibasilar rales.  No wheezing.  Abdomen: Soft/+BS, non tender, non distended, no guarding  Extremities: No edema, No lymphangitis, No petechiae, No rashes, no synovitis   Data Reviewed: I have personally reviewed following labs and imaging studies Basic Metabolic Panel: Recent Labs  Lab 10/27/17 0955 10/28/17 0445  NA 138 138  K 3.9 3.7  CL 103 102  CO2 25 28  GLUCOSE 114* 125*  BUN 14 17  CREATININE 1.31* 1.44*  CALCIUM 10.1 10.2  MG  --  1.6*   Liver Function Tests: No results for input(s): AST, ALT, ALKPHOS, BILITOT, PROT, ALBUMIN in the last 168 hours. No results for input(s): LIPASE, AMYLASE in the last 168 hours. No results for input(s): AMMONIA in the last 168 hours. Coagulation Profile: No results for input(s): INR, PROTIME in the last 168  hours. CBC: Recent Labs  Lab 10/27/17 0955  WBC 6.6  HGB 11.7*  HCT 35.6*  MCV 85.2  PLT 222   Cardiac Enzymes: Recent Labs  Lab 10/27/17 1223 10/27/17 1658 10/27/17 2225  TROPONINI <0.03 <0.03 <0.03   BNP: Invalid input(s): POCBNP CBG: Recent Labs  Lab 10/27/17 1606 10/27/17 2116 10/28/17 0736 10/28/17 1120 10/28/17 1638  GLUCAP 126* 120* 132* 153* 95   HbA1C: Recent Labs    10/28/17 0445  HGBA1C 6.2*   Urine analysis: No results found for: COLORURINE, APPEARANCEUR, LABSPEC, PHURINE, GLUCOSEU, HGBUR, BILIRUBINUR, KETONESUR, PROTEINUR, UROBILINOGEN, NITRITE, LEUKOCYTESUR Sepsis Labs: @LABRCNTIP (procalcitonin:4,lacticidven:4) )No results found for this or any previous visit (from the past 240 hour(s)).   Scheduled Meds: . amLODipine  5 mg Oral Daily  . aspirin EC  81 mg Oral Daily  . benazepril  20 mg Oral Daily  . calcium-vitamin D  1 tablet Oral BID  . enoxaparin (LOVENOX) injection  40 mg Subcutaneous Q24H  . FLUoxetine  20 mg Oral Daily  . fluticasone  2 spray Each Nare Daily  . insulin aspart  0-9 Units Subcutaneous TID WC  . levothyroxine  88 mcg Oral QAC breakfast  . loratadine  10 mg Oral Daily  . pantoprazole  40 mg Oral Daily  . rosuvastatin  20 mg Oral Daily  . traZODone  100 mg Oral QHS   Continuous Infusions: . magnesium sulfate 1 - 4 g bolus IVPB      Procedures/Studies: Dg Chest 2 View  Result Date: 10/27/2017 CLINICAL DATA:  67 year old female with history of chest pressure and shortness of breath today. EXAM: CHEST  2 VIEW COMPARISON:  No priors. FINDINGS: Lung volumes are normal. No consolidative airspace disease. No pleural effusions. No pneumothorax. No pulmonary nodule or mass noted. Pulmonary vasculature and the cardiomediastinal silhouette are within normal limits. IMPRESSION: No radiographic evidence of acute cardiopulmonary disease. Electronically Signed   By: Vinnie Langton M.D.   On: 10/27/2017 10:39    Orson Eva,  DO  [facility] Pager [phone]  If 7PM-7AM, please contact night-coverage [email or web address] Password [email or web address] 10/28/2017, 4:42 PM   LOS: 0 days

## 2017-10-28 NOTE — Care Management Obs Status (Signed)
[facility] NOTIFICATION   Patient Details  Name: April Ross MRN: [id number] Date of Birth: Dec 28, 1950   Medicare Observation Status Notification Given:  Yes    Sherald Barge, RN 10/28/2017, 10:30 AM

## 2017-10-28 NOTE — Progress Notes (Signed)
Has c/o pressure pain in left chest rated a 2 and left back pain rated an 8 today.  Denies sob.  Echo tech stated echo might not be until morning due to working alone.  Texted Dr. Carles Collet with this

## 2017-10-28 NOTE — Discharge Summary (Signed)
Physician Discharge Summary  April Ross SNK:[id number] DOB: 12/31/1950 DOA: 10/27/2017  PCP: Jinny Sanders, MD  Admit date: 10/27/2017 Discharge date: 10/29/2017  Admitted From: Home Disposition:  Home   Recommendations for Outpatient Follow-up:  1. Follow up with PCP in 1-2 weeks 2. Please obtain BMP/CBC in one week     Discharge Condition: Stable CODE STATUS: FULL Diet recommendation: Heart Healthy / Carb Modified   Brief/Interim Summary: 67 y.o.femalewith medical history ofdiabetes mellitus, hypertension, hyperlipidemia, tobacco abuse, CKD stage III, hypothyroidism, depression/anxiety presented with 5-day history of intermittent chest pressure that is substernal that radiates to her left arm. The patient states that this can happen with rest as well as with exertion. She stated that it was brought on this past week with vacuuming her house. She states that last a few minutes and then resolve spontaneously. She has some associated shortness of breath. On the morning of 10/27/2017, the patient was in the shower when she had chest discomfort associated with shortness of breath and dizziness with nausea. As result, she presents emergency department for further evaluation.  Cardiology was consulted to assist with management    Discharge Diagnoses:  Chest pain -pt has typical and atypical components -Cycle troponins--neg x 3 -Echocardiogram--EF 50-55%, grade 1 DD, no WMA -Lipid panel--LDL 61 -Hemoglobin A1c--6.2 -continue ASA -cardiology consult-->NM stress test-->low risk  Diabetes mellitus type 2 -Holding metformin--restart after d/c -Hemoglobin A1c--6.2 -d/c sliding scale  Hyperlipidemia -Continue statin  Essential hypertension -discontinue benazepril in setting of CKD 3 and gradual rise in serum creatinine and risk of AKI -add amlodipine--increase to 10 mg daily in lieu of discontinuing benazepril  CKD stage III -Baseline creatinine 1.2-1.5 -a.m.  BMP -serum creatinine 1.59 on day of d/c   Depression/anxiety -Continue fluoxetine and trazodone  Hypothyroidism -Continue Synthroid  Tobacco abuse -She has a 10-pack-year history -Tobacco cessation discussed  Hypomagnesemia -replete  Low Back pain -musculoskeletal -start flexeril--helped the patient's pain and discomfort -lumbar xray--negative for dislocation or fracture      Discharge Instructions  Discharge Instructions    Diet - low sodium heart healthy   Complete by:  As directed    Increase activity slowly   Complete by:  As directed      Allergies as of 10/29/2017      Reactions   Acetaminophen    Asa [aspirin]    Ibuprofen    Other [place]   Mycolog-II & HRT Hormone patch      Medication List    TAKE these medications   alendronate 70 MG tablet Commonly known as:  FOSAMAX TAKE 1 TABLET EVERY 7 DAYS TAKE WITH A FULL GLASS OF WATER ON AN EMPTY STOMACH   amLODipine 10 MG tablet Commonly known as:  NORVASC Take 1 tablet (10 mg total) by mouth daily. Start taking on:  10/30/2017   aspirin EC 81 MG tablet Take 81 mg by mouth daily.   benazepril 20 MG tablet Commonly known as:  LOTENSIN TAKE 1 TABLET BY MOUTH EVERY DAY   calcium-vitamin D 500-200 MG-UNIT tablet Take 1 tablet by mouth daily.   cetirizine 10 MG tablet Commonly known as:  ZYRTEC Take 10 mg by mouth at bedtime.   cyclobenzaprine 10 MG tablet Commonly known as:  FLEXERIL Take 1 tablet (10 mg total) by mouth 3 (three) times daily as needed for muscle spasms.   FLUoxetine 20 MG capsule Commonly known as:  PROZAC TAKE ONE CAPSULE BY MOUTH EVERY DAY   fluticasone 50 MCG/ACT nasal spray  Commonly known as:  FLONASE PLACE 2 SPRAYS INTO BOTH NOSTRILS DAILY.   levothyroxine 88 MCG tablet Commonly known as:  SYNTHROID, LEVOTHROID Take 88 mcg by mouth daily.   metFORMIN 1000 MG tablet Commonly known as:  GLUCOPHAGE TAKE 1 TABLET BY MOUTH TWICE A DAY   omeprazole 20 MG  capsule Commonly known as:  PRILOSEC TAKE 1 CAPSULE BY MOUTH EVERY DAY   rosuvastatin 20 MG tablet Commonly known as:  CRESTOR TAKE 1 TABLET (20 MG TOTAL) BY MOUTH DAILY.   traMADol 50 MG tablet Commonly known as:  ULTRAM Take 1 tablet (50 mg total) by mouth every 4 (four) hours as needed for severe pain.   traZODone 100 MG tablet Commonly known as:  DESYREL TAKE 1 TABLET BY MOUTH EVERYDAY AT BEDTIME   zolpidem 6.25 MG CR tablet Commonly known as:  AMBIEN CR Take 6.25 mg by mouth at bedtime as needed for sleep.       Allergies  Allergen Reactions  . Acetaminophen   . Asa [Aspirin]   . Ibuprofen   . Other [place]    Mycolog-II & HRT Hormone patch    Consultations:  cardiology   Procedures/Studies: Dg Chest 2 View  Result Date: 10/27/2017 CLINICAL DATA:  67 year old female with history of chest pressure and shortness of breath today. EXAM: CHEST  2 VIEW COMPARISON:  No priors. FINDINGS: Lung volumes are normal. No consolidative airspace disease. No pleural effusions. No pneumothorax. No pulmonary nodule or mass noted. Pulmonary vasculature and the cardiomediastinal silhouette are within normal limits. IMPRESSION: No radiographic evidence of acute cardiopulmonary disease. Electronically Signed   By: Vinnie Langton M.D.   On: 10/27/2017 10:39   Dg Lumbar Spine 2-3 Views  Result Date: 10/28/2017 CLINICAL DATA:  Left low back pain. EXAM: LUMBAR SPINE - 2-3 VIEW COMPARISON:  None. FINDINGS: No fracture or subluxation. No significant degenerative disease with mild osteophyte formation seen at several levels. No bony lesions. Calcified plaque present in the abdominal aorta. IMPRESSION: No fracture or significant degenerative disc disease. Aortic atherosclerosis. Electronically Signed   By: Aletta Edouard M.D.   On: 10/28/2017 20:26   Nm Myocar Multi W/spect W/wall Motion / Ef  Result Date: 10/29/2017  There was no ST segment deviation noted during stress.  The study is  normal. There are no perfusion defects consistent with prior infarct or current ischemia.  This is a low risk study.  The left ventricular ejection fraction is normal (55-65%).         Discharge Exam: Vitals:   10/28/17 2100 10/29/17 0300  BP: (!) 127/46 (!) 134/54  Pulse: (!) 54 (!) 48  Resp:    Temp: 97.9 F (36.6 C) 98.1 F (36.7 C)  SpO2:  95%   Vitals:   10/28/17 0500 10/28/17 1315 10/28/17 2100 10/29/17 0300  BP: (!) 127/54 (!) 134/58 (!) 127/46 (!) 134/54  Pulse: (!) 52 (!) 58 (!) 54 (!) 48  Resp: 18 18    Temp: 98.2 F (36.8 C) 98.4 F (36.9 C) 97.9 F (36.6 C) 98.1 F (36.7 C)  TempSrc: Oral Oral Oral Oral  SpO2: 96% 97%  95%  Weight:      Height:        General: Pt is alert, awake, not in acute distress Cardiovascular: RRR, S1/S2 +, no rubs, no gallops Respiratory: CTA bilaterally, no wheezing, no rhonchi Abdominal: Soft, NT, ND, bowel sounds + Extremities: no edema, no cyanosis   The results of significant diagnostics from this hospitalization (  including imaging, microbiology, ancillary and laboratory) are listed below for reference.    Significant Diagnostic Studies: Dg Chest 2 View  Result Date: 10/27/2017 CLINICAL DATA:  67 year old female with history of chest pressure and shortness of breath today. EXAM: CHEST  2 VIEW COMPARISON:  No priors. FINDINGS: Lung volumes are normal. No consolidative airspace disease. No pleural effusions. No pneumothorax. No pulmonary nodule or mass noted. Pulmonary vasculature and the cardiomediastinal silhouette are within normal limits. IMPRESSION: No radiographic evidence of acute cardiopulmonary disease. Electronically Signed   By: Vinnie Langton M.D.   On: 10/27/2017 10:39   Dg Lumbar Spine 2-3 Views  Result Date: 10/28/2017 CLINICAL DATA:  Left low back pain. EXAM: LUMBAR SPINE - 2-3 VIEW COMPARISON:  None. FINDINGS: No fracture or subluxation. No significant degenerative disease with mild osteophyte formation  seen at several levels. No bony lesions. Calcified plaque present in the abdominal aorta. IMPRESSION: No fracture or significant degenerative disc disease. Aortic atherosclerosis. Electronically Signed   By: Aletta Edouard M.D.   On: 10/28/2017 20:26   Nm Myocar Multi W/spect W/wall Motion / Ef  Result Date: 10/29/2017  There was no ST segment deviation noted during stress.  The study is normal. There are no perfusion defects consistent with prior infarct or current ischemia.  This is a low risk study.  The left ventricular ejection fraction is normal (55-65%).      Microbiology: No results found for this or any previous visit (from the past 240 hour(s)).   Labs: Basic Metabolic Panel: Recent Labs  Lab 10/27/17 0955 10/28/17 0445 10/29/17 0655  NA 138 138 137  K 3.9 3.7 4.0  CL 103 102 98*  CO2 25 28 29   GLUCOSE 114* 125* 135*  BUN 14 17 19   CREATININE 1.31* 1.44* 1.59*  CALCIUM 10.1 10.2 10.3  MG  --  1.6* 1.9   Liver Function Tests: No results for input(s): AST, ALT, ALKPHOS, BILITOT, PROT, ALBUMIN in the last 168 hours. No results for input(s): LIPASE, AMYLASE in the last 168 hours. No results for input(s): AMMONIA in the last 168 hours. CBC: Recent Labs  Lab 10/27/17 0955  WBC 6.6  HGB 11.7*  HCT 35.6*  MCV 85.2  PLT 222   Cardiac Enzymes: Recent Labs  Lab 10/27/17 1223 10/27/17 1658 10/27/17 2225 10/29/17 0655  CKTOTAL  --   --   --  59  TROPONINI <0.03 <0.03 <0.03  --    BNP: Invalid input(s): POCBNP CBG: Recent Labs  Lab 10/27/17 1606 10/27/17 2116 10/28/17 0736 10/28/17 1120 10/28/17 1638  GLUCAP 126* 120* 132* 153* 95    Time coordinating discharge:  Greater than 30 minutes  Signed:  Orson Eva, DO [facility] Pager: [phone] 10/29/2017, 2:12 PM

## 2017-10-29 ENCOUNTER — Observation Stay (HOSPITAL_BASED_OUTPATIENT_CLINIC_OR_DEPARTMENT_OTHER): Payer: Medicare Other

## 2017-10-29 DIAGNOSIS — Z7982 Long term (current) use of aspirin: Secondary | ICD-10-CM | POA: Diagnosis not present

## 2017-10-29 DIAGNOSIS — I10 Essential (primary) hypertension: Secondary | ICD-10-CM

## 2017-10-29 DIAGNOSIS — Z79899 Other long term (current) drug therapy: Secondary | ICD-10-CM | POA: Diagnosis not present

## 2017-10-29 DIAGNOSIS — R072 Precordial pain: Secondary | ICD-10-CM | POA: Diagnosis not present

## 2017-10-29 DIAGNOSIS — R079 Chest pain, unspecified: Secondary | ICD-10-CM | POA: Diagnosis not present

## 2017-10-29 DIAGNOSIS — F17210 Nicotine dependence, cigarettes, uncomplicated: Secondary | ICD-10-CM | POA: Diagnosis not present

## 2017-10-29 DIAGNOSIS — E1121 Type 2 diabetes mellitus with diabetic nephropathy: Secondary | ICD-10-CM | POA: Diagnosis not present

## 2017-10-29 DIAGNOSIS — E1122 Type 2 diabetes mellitus with diabetic chronic kidney disease: Secondary | ICD-10-CM | POA: Diagnosis not present

## 2017-10-29 DIAGNOSIS — I129 Hypertensive chronic kidney disease with stage 1 through stage 4 chronic kidney disease, or unspecified chronic kidney disease: Secondary | ICD-10-CM | POA: Diagnosis not present

## 2017-10-29 DIAGNOSIS — R0789 Other chest pain: Secondary | ICD-10-CM

## 2017-10-29 DIAGNOSIS — Z720 Tobacco use: Secondary | ICD-10-CM | POA: Diagnosis not present

## 2017-10-29 DIAGNOSIS — E039 Hypothyroidism, unspecified: Secondary | ICD-10-CM | POA: Diagnosis not present

## 2017-10-29 DIAGNOSIS — N183 Chronic kidney disease, stage 3 (moderate): Secondary | ICD-10-CM | POA: Diagnosis not present

## 2017-10-29 DIAGNOSIS — Z7984 Long term (current) use of oral hypoglycemic drugs: Secondary | ICD-10-CM | POA: Diagnosis not present

## 2017-10-29 LAB — NM MYOCAR MULTI W/SPECT W/WALL MOTION / EF
CHL CUP NUCLEAR SRS: 4
CHL CUP NUCLEAR SSS: 4
CHL CUP RESTING HR STRESS: 51 {beats}/min
LV sys vol: 22 mL
LVDIAVOL: 58 mL (ref 46–106)
Peak HR: 110 {beats}/min
RATE: 0.29
SDS: 0
TID: 0.98

## 2017-10-29 LAB — MAGNESIUM: Magnesium: 1.9 mg/dL (ref 1.7–2.4)

## 2017-10-29 LAB — BASIC METABOLIC PANEL
Anion gap: 10 (ref 5–15)
BUN: 19 mg/dL (ref 6–20)
CALCIUM: 10.3 mg/dL (ref 8.9–10.3)
CO2: 29 mmol/L (ref 22–32)
CREATININE: 1.59 mg/dL — AB (ref 0.44–1.00)
Chloride: 98 mmol/L — ABNORMAL LOW (ref 101–111)
GFR calc non Af Amer: 32 mL/min — ABNORMAL LOW (ref >60)
GFR, EST AFRICAN AMERICAN: 37 mL/min — AB (ref >60)
Glucose, Bld: 135 mg/dL — ABNORMAL HIGH (ref 65–99)
Potassium: 4 mmol/L (ref 3.5–5.1)
SODIUM: 137 mmol/L (ref 135–145)

## 2017-10-29 LAB — ECHOCARDIOGRAM COMPLETE
Height: 62 in
Weight: 2278.4 oz

## 2017-10-29 LAB — CK: CK TOTAL: 59 U/L (ref 38–234)

## 2017-10-29 IMAGING — NM NM MYOCAR MULTI W/SPECT W/WALL MOTION & EF
2 series · 12 of 12 positions shown · non-contrast
Comparison: none

[Series 1: rest · 6.51mm/px · 6 of 64 frames shown]
[frame 6/64]
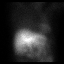
[frame 16/64]
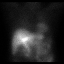
[frame 27/64]
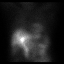
[frame 38/64]
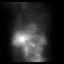
[frame 48/64]
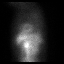
[frame 59/64]
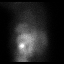

[Series 3: stress gated - perfusion · 6.51mm/px · 6 of 64 frames shown]
[frame 6/64]
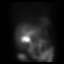
[frame 16/64]
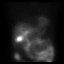
[frame 27/64]
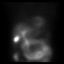
[frame 38/64]
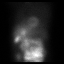
[frame 48/64]
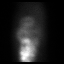
[frame 59/64]
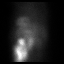

[12 of 12 positions shown; findings below may reference images not displayed]

Canned report from images found in remote index.

Refer to host system for actual result text.

## 2017-10-29 MED ORDER — SODIUM CHLORIDE 0.9% FLUSH
INTRAVENOUS | Status: AC
  Administered 2017-10-29: 10 mL via INTRAVENOUS
  Filled 2017-10-29: qty 10

## 2017-10-29 MED ORDER — AMLODIPINE BESYLATE 10 MG PO TABS: 10.0000 mg | ORAL_TABLET | Freq: Every day | ORAL | 1 refills | Status: DC

## 2017-10-29 MED ORDER — ENOXAPARIN SODIUM 30 MG/0.3ML ~~LOC~~ SOLN: 30.0000 mg | SUBCUTANEOUS | Status: DC

## 2017-10-29 MED ORDER — REGADENOSON 0.4 MG/5ML IV SOLN
INTRAVENOUS | Status: AC
  Administered 2017-10-29: 0.4 mg via INTRAVENOUS
  Filled 2017-10-29: qty 5

## 2017-10-29 MED ORDER — TECHNETIUM TC 99M TETROFOSMIN IV KIT
10.0000 | PACK | Freq: Once | INTRAVENOUS | Status: AC | PRN
  Administered 2017-10-29: 10 via INTRAVENOUS

## 2017-10-29 MED ORDER — CYCLOBENZAPRINE HCL 10 MG PO TABS: 10.0000 mg | ORAL_TABLET | Freq: Three times a day (TID) | ORAL | 0 refills | Status: DC | PRN

## 2017-10-29 MED ORDER — TECHNETIUM TC 99M TETROFOSMIN IV KIT
30.0000 | PACK | Freq: Once | INTRAVENOUS | Status: AC | PRN
  Administered 2017-10-29: 30 via INTRAVENOUS

## 2017-10-29 NOTE — Progress Notes (Signed)
Discharge instructions gone over with patient, verbalized understanding. IV removed, patient tolerated procedure well. New medications gone over with patient, patient verbalized that she understands how to take them.

## 2017-10-29 NOTE — Consult Note (Signed)
Cardiology Consult    Patient ID: April Ross [place]; [id number]; 1950-12-17   Admit date: 10/27/2017 Date of Consult: 10/29/2017  Primary Care Provider: Jinny Sanders, MD Primary Cardiologist:New - Dr. Harl Bowie   Patient Profile    April Ross is a 67 y.o. female with past medical history of Type 2 DM, HTN, HLD, Stage 3 CKD, and tobacco use who is being seen today for the evaluation of chest pain at the request of Dr. Carles Collet.   History of Present Illness    [name] presented to [facility] ED on 10/27/2017 for evaluation of chest pain for the past several days. Describes this as a pressure which is centered along her chest and radiates to her back at times. The pain does occasionally radiate down her left arm. No associated dyspnea, diaphoresis, nausea, or vomiting. Her pain intensifies at various times, sometimes worse with activity and other times with positional changes. Does not exercise regularly but has experienced worsening fatigue with vacuuming and walking around the grocery store over the past 3-4 months. Has chronic low back pain and thinks this might be a contributing factor to her fatigue and decreased activity.   No recent palpitations, orthopnea, PND, or lower extremity edema.   She denies any history of known CAD or cardiac arrhythmias. Underwent an exercise stress test 10+ years ago which she says was normal. Reports having a known history of HTN, HLD, Type 2 DM, and Hypothyroidism. Family history is significant for atrial fibrillation in her maternal uncle. She is unaware of her father's medical history. Does smoke 0.5 ppd. Denies any alcohol use or recreational drug use. Has been under increased stress since her daughter and two grandchildren moved in with her.   Initial labs show WBC 6.6, Hgb 11.7, platelets 222, Na+ 138, K+ 3.9, and creatinine 1.31 (close to baseline). Initial and cyclic troponin values have been negative. FLP shows total cholesterol 161, HDL 27, and LDL  61. Hgb A1c 6.2. Telemetry shows sinus bradycardia, HR in mid-40's to 50's with no ectopic events. CXR showing no acute findings. EKG shows sinus bradycardia, HR 48, with LBBB.    Past Medical History:  Diagnosis Date  . Allergy   . Arthritis   . CRD (chronic renal disease)   . Depression   . Diabetes mellitus without complication ([place])   . GERD (gastroesophageal reflux disease)   . Hyperlipidemia   . Hypertension   . polyps in colon   . Thyroid disease   . Urinary tract infection     Past Surgical History:  Procedure Laterality Date  . ABDOMINAL HYSTERECTOMY    . APPENDECTOMY       Home Medications:  Prior to Admission medications   Medication Sig Start Date End Date Taking? Authorizing Provider  alendronate (FOSAMAX) 70 MG tablet TAKE 1 TABLET EVERY 7 DAYS TAKE WITH A FULL GLASS OF WATER ON AN EMPTY STOMACH 05/23/17  Yes Bedsole, Amy E, MD  aspirin EC 81 MG tablet Take 81 mg by mouth daily.   Yes [provider]  benazepril (LOTENSIN) 20 MG tablet TAKE 1 TABLET BY MOUTH EVERY DAY 07/03/17  Yes Bedsole, Amy E, MD  Calcium Carb-Cholecalciferol (CALCIUM-VITAMIN D) 500-200 MG-UNIT tablet Take 1 tablet by mouth daily.   Yes [provider]  cetirizine (ZYRTEC) 10 MG tablet Take 10 mg by mouth at bedtime.   Yes [provider]  FLUoxetine (PROZAC) 20 MG capsule TAKE ONE CAPSULE BY MOUTH EVERY DAY 06/19/17  Yes Bedsole, Amy E, MD  fluticasone (FLONASE) 50 MCG/ACT nasal spray PLACE 2 SPRAYS INTO BOTH NOSTRILS DAILY. 06/05/17  Yes Bedsole, Amy E, MD  levothyroxine (SYNTHROID, LEVOTHROID) 88 MCG tablet Take 88 mcg by mouth daily. 12/16/15  Yes [provider]  metFORMIN (GLUCOPHAGE) 1000 MG tablet TAKE 1 TABLET BY MOUTH TWICE A DAY 09/01/17  Yes Bedsole, Amy E, MD  omeprazole (PRILOSEC) 20 MG capsule TAKE 1 CAPSULE BY MOUTH EVERY DAY 09/02/17  Yes Bedsole, Amy E, MD  rosuvastatin (CRESTOR) 20 MG tablet TAKE 1 TABLET (20 MG TOTAL) BY MOUTH DAILY. 07/18/17   Yes Bedsole, Amy E, MD  traMADol (ULTRAM) 50 MG tablet Take 1 tablet (50 mg total) by mouth every 4 (four) hours as needed for severe pain. 01/28/16  Yes Bedsole, Amy E, MD  traZODone (DESYREL) 100 MG tablet TAKE 1 TABLET BY MOUTH EVERYDAY AT BEDTIME 06/01/17  Yes Bedsole, Amy E, MD  zolpidem (AMBIEN CR) 6.25 MG CR tablet Take 6.25 mg by mouth at bedtime as needed for sleep.    [provider]    Inpatient Medications: Scheduled Meds: . amLODipine  5 mg Oral Daily  . aspirin EC  81 mg Oral Daily  . benazepril  20 mg Oral Daily  . calcium-vitamin D  1 tablet Oral BID  . enoxaparin (LOVENOX) injection  40 mg Subcutaneous Q24H  . FLUoxetine  20 mg Oral Daily  . fluticasone  2 spray Each Nare Daily  . levothyroxine  88 mcg Oral QAC breakfast  . loratadine  10 mg Oral Daily  . pantoprazole  40 mg Oral Daily  . rosuvastatin  20 mg Oral Daily  . traZODone  100 mg Oral QHS   Continuous Infusions:  PRN Meds: acetaminophen **OR** acetaminophen, cyclobenzaprine, hydrALAZINE, ondansetron **OR** ondansetron (ZOFRAN) IV, traMADol, zolpidem  Allergies:    Allergies  Allergen Reactions  . Acetaminophen   . Asa [Aspirin]   . Ibuprofen   . Other [name]    Mycolog-II & HRT Hormone patch    Social History:   Social History   Socioeconomic History  . Marital status: Widowed    Spouse name: Not on file  . Number of children: Not on file  . Years of education: Not on file  . Highest education level: Not on file  Social Needs  . Financial resource strain: Not on file  . Food insecurity - worry: Not on file  . Food insecurity - inability: Not on file  . Transportation needs - medical: Not on file  . Transportation needs - non-medical: Not on file  Occupational History  . Not on file  Tobacco Use  . Smoking status: Current Some Day Smoker    Packs/day: 0.50    Types: Cigarettes  . Smokeless tobacco: Never Used  Substance and Sexual Activity  . Alcohol use: Yes    Alcohol/week:  0.0 oz    Comment: occ glass of wine or mix drink  . Drug use: No  . Sexual activity: Not on file  Other Topics Concern  . Not on file  Social History Narrative  . Not on file     Family History:    Family History  Problem Relation Age of Onset  . Alzheimer's disease Mother   . Hyperlipidemia Mother   . Hypertension Mother   . Asthma Maternal Grandmother   . Diabetes Maternal Grandfather   . Alcohol abuse Maternal Grandfather   . Hyperlipidemia Maternal Grandfather   . Stroke Maternal Grandfather   .  Hypertension Maternal Grandfather   . Diabetes Paternal Grandmother   . Alcohol abuse Maternal Uncle   . Cholecystitis Maternal Uncle   . Arthritis Maternal Uncle     Review of Systems    General:  No chills, fever, night sweats or weight changes.  Cardiovascular:  No dyspnea on exertion, edema, orthopnea, palpitations, paroxysmal nocturnal dyspnea. Positive for chest pain.  Dermatological: No Barley, lesions/masses Respiratory: No cough, dyspnea Urologic: No hematuria, dysuria Abdominal:   No nausea, vomiting, diarrhea, bright red blood per rectum, melena, or hematemesis Neurologic:  No visual changes, wkns, changes in mental status. All other systems reviewed and are otherwise negative except as noted above.  Physical Exam/Data    Vitals:   10/28/17 0500 10/28/17 1315 10/28/17 2100 10/29/17 0300  BP: (!) 127/54 (!) 134/58 (!) 127/46 (!) 134/54  Pulse: (!) 52 (!) 58 (!) 54 (!) 48  Resp: 18 18    Temp: 98.2 F (36.8 C) 98.4 F (36.9 C) 97.9 F (36.6 C) 98.1 F (36.7 C)  TempSrc: Oral Oral Oral Oral  SpO2: 96% 97%  95%  Weight:      Height:        Intake/Output Summary (Last 24 hours) at 10/29/2017 0729 Last data filed at 10/29/2017 0356 Gross per 24 hour  Intake 960 ml  Output -  Net 960 ml   Filed Weights   10/27/17 0938 10/27/17 1439  Weight: 141 lb (64 kg) 142 lb 6.4 oz (64.6 kg)   Body mass index is 26.05 kg/m.   General: Pleasant Caucasian female  appearing in NAD Psych: Normal affect. Neuro: Alert and oriented X 3. Moves all extremities spontaneously. HEENT: Normal  Neck: Supple without bruits or JVD. Lungs:  Resp regular and unlabored, CTA without wheezing or rales. Heart: RRR no s3, s4, or murmurs. Abdomen: Soft, non-tender, non-distended, BS + x 4.  Extremities: No clubbing, cyanosis or edema. DP/PT/Radials 2+ and equal bilaterally.   EKG:  The EKG was personally reviewed and demonstrates: Sinus bradycardia, HR 48, with LBBB.    Telemetry:  Telemetry was personally reviewed and demonstrates: Sinus bradycardia, HR in mid-40's to 50's.    Labs/Studies     Relevant CV Studies:  Echocardiogram: Pending  Laboratory Data:  Chemistry Recent Labs  Lab 10/27/17 0955 10/28/17 0445  NA 138 138  K 3.9 3.7  CL 103 102  CO2 25 28  GLUCOSE 114* 125*  BUN 14 17  CREATININE 1.31* 1.44*  CALCIUM 10.1 10.2  GFRNONAA 41* 36*  GFRAA 47* 42*  ANIONGAP 10 8    No results for input(s): PROT, ALBUMIN, AST, ALT, ALKPHOS, BILITOT in the last 168 hours. Hematology Recent Labs  Lab 10/27/17 0955  WBC 6.6  RBC 4.18  HGB 11.7*  HCT 35.6*  MCV 85.2  MCH 28.0  MCHC 32.9  RDW 13.1  PLT 222   Cardiac Enzymes Recent Labs  Lab 10/27/17 1223 10/27/17 1658 10/27/17 2225  TROPONINI <0.03 <0.03 <0.03   No results for input(s): TROPIPOC in the last 168 hours.  BNPNo results for input(s): BNP, PROBNP in the last 168 hours.  DDimer No results for input(s): DDIMER in the last 168 hours.  Radiology/Studies:  Dg Chest 2 View  Result Date: 10/27/2017 CLINICAL DATA:  67 year old female with history of chest pressure and shortness of breath today. EXAM: CHEST  2 VIEW COMPARISON:  No priors. FINDINGS: Lung volumes are normal. No consolidative airspace disease. No pleural effusions. No pneumothorax. No pulmonary nodule or mass noted.  Pulmonary vasculature and the cardiomediastinal silhouette are within normal limits. IMPRESSION: No  radiographic evidence of acute cardiopulmonary disease. Electronically Signed   By: Vinnie Langton M.D.   On: 10/27/2017 10:39   Dg Lumbar Spine 2-3 Views  Result Date: 10/28/2017 CLINICAL DATA:  Left low back pain. EXAM: LUMBAR SPINE - 2-3 VIEW COMPARISON:  None. FINDINGS: No fracture or subluxation. No significant degenerative disease with mild osteophyte formation seen at several levels. No bony lesions. Calcified plaque present in the abdominal aorta. IMPRESSION: No fracture or significant degenerative disc disease. Aortic atherosclerosis. Electronically Signed   By: Aletta Edouard M.D.   On: 10/28/2017 20:26     Assessment & Plan    1. Chest Pain with Mixed Typical and Atypical Features/ LBBB - the patient presented for evaluation of chest pressure which has mixed features as it sometimes worse with activity but at other times can occur at rest and exacerbated with turning from side-to-side.  - initial and cyclic troponin values have been negative. EKG shows sinus bradycardia with LBBB (no prior tracings available for comparison).  - she does have multiple cardiac risk factors including HTN, HLD, Type 2 DM, and tobacco use.  - an echocardiogram is pending to assess LV function and wall motion. Consider a Lexiscan Myoview for further ischemic evaluation (would avoid a treadmill stress test due to her chronic back pain and LBBB).   2. Sinus Bradycardia - HR has been in the mid-40's to 50's on telemetry. Patient reports this is normal for her as she follows her HR on her iWatch.  - continue to avoid AV nodal blocking agents.   3. HTN - BP well-controlled at 134/54 on most recent check. - continue current medication regimen.   4. HLD - FLP shows total cholesterol 161, HDL 27, and LDL 61. - continue Crestor 20mg  daily.   5. Type 2 DM - Hgb A1c well-controlled at 6.2. - continue current medication regimen.   6. Stage 3 CKD - creatinine at 1.31 (close to baseline).   7. Tobacco  Use - cessation advised.    For questions or updates, please contact [place] Please consult [email or web address] for contact info under Cardiology/STEMI.  Signed, Erma Heritage, PA-C 10/29/2017, 7:29 AM Pager: [phone]  Attending note Patient seen and discussed with PA Ahmed Prima, I agree with her documentation above. 67 yo female presents with somewhat mixed symptoms of chest pain. She does have multiple CAD risk factors as well as a LBBB of unknown chronicity. Based on her underlying risk and symptoms an ischemic evaluation is warranted, we will plan for a nuclear stress test today.    Carlyle Dolly MD

## 2017-10-29 NOTE — Progress Notes (Signed)
Benign echo overall, nuclear stress without ischemia. No plans for further cardiac workup at this time. We will arrange outpatient f/u in 2 weeks, will signoff inpatient care.    Zandra Abts MD

## 2017-10-29 NOTE — Progress Notes (Signed)
*  PRELIMINARY RESULTS* Echocardiogram 2D Echocardiogram has been performed.  April Ross, April Ross 10/29/2017, 11:10 AM

## 2017-11-11 NOTE — Progress Notes (Signed)
Cardiology Office Note    Date:  11/12/2017   ID:  April Ross, DOB 16-Jan-1951, MRN [id number]  PCP:  Jinny Sanders, MD  Cardiologist: Dr. Harl Bowie  Chief Complaint  Patient presents with  . Hospitalization Follow-up    History of Present Illness:    April Ross is a 67 y.o. female with past medical history of Type 2 DM, HTN, HLD, Stage 3 CKD, and tobacco use who presents to the office today for hospital follow-up.   Was recently admitted to [facility] from 1/20 - 10/29/2017 for evaluation of chest pain which she described as a pressure centered along her chest and radiating to her back at times. Her pain intensified at various times, sometimes worse with activity and other times with positional changes. Cyclic troponin values remained negative and her EKG showed a presumed new LBBB as there were no prior tracings available for comparsion. An echocardiogram was obtained and showed a preserved EF of 55% to 60%, Grade 1 DD, and no regional WMA. A Lexiscan Myoview was obtained and showed no perfusion defects consistent with prior infarct or current ischemia, overall being a low-risk study.   In talking with the patient today, she reports overall doing well since her recent hospitalization. She denies any recurrent episodes of chest discomfort. Has been experiencing low back pain and is taking Flexeril as needed. No recent orthopnea, PND, lower extremity edema, or palpitations. She does have baseline dyspnea on exertion but denies any acute worsening of her symptoms. Continues to smoke 5-10 cigarettes per day.   She has been following blood pressure at home and reports this has been well controlled. BP is at 118/62 during today's visit. Does report being under increased social stress as her daughter and two grandchildren are currently living with her.    Past Medical History:  Diagnosis Date  . Allergy   . Arthritis   . CRD (chronic renal disease)   . Depression   . Diabetes  mellitus without complication ([place])   . GERD (gastroesophageal reflux disease)   . Hyperlipidemia   . Hypertension   . LBBB (left bundle branch block)    a. diagnosed in 10/2017 - echo showed a preserved EF of 55-60% and NST was low-risk showing no evidence of ischemia.   . polyps in colon   . Thyroid disease   . Urinary tract infection     Past Surgical History:  Procedure Laterality Date  . ABDOMINAL HYSTERECTOMY    . APPENDECTOMY      Current Medications: Outpatient Medications Prior to Visit  Medication Sig Dispense Refill  . alendronate (FOSAMAX) 70 MG tablet TAKE 1 TABLET EVERY 7 DAYS TAKE WITH A FULL GLASS OF WATER ON AN EMPTY STOMACH 4 tablet 11  . aspirin EC 81 MG tablet Take 81 mg by mouth daily.    . Calcium Carb-Cholecalciferol (CALCIUM-VITAMIN D) 500-200 MG-UNIT tablet Take 1 tablet by mouth daily.    . cetirizine (ZYRTEC) 10 MG tablet Take 10 mg by mouth at bedtime.    . cyclobenzaprine (FLEXERIL) 10 MG tablet Take 1 tablet (10 mg total) by mouth 3 (three) times daily as needed for muscle spasms. 30 tablet 0  . FLUoxetine (PROZAC) 20 MG capsule TAKE ONE CAPSULE BY MOUTH EVERY DAY 90 capsule 1  . fluticasone (FLONASE) 50 MCG/ACT nasal spray PLACE 2 SPRAYS INTO BOTH NOSTRILS DAILY. 16 g 5  . levothyroxine (SYNTHROID, LEVOTHROID) 88 MCG tablet Take 88 mcg by mouth daily.  3  .  metFORMIN (GLUCOPHAGE) 1000 MG tablet TAKE 1 TABLET BY MOUTH TWICE A DAY 180 tablet 0  . omeprazole (PRILOSEC) 20 MG capsule TAKE 1 CAPSULE BY MOUTH EVERY DAY 90 capsule 1  . rosuvastatin (CRESTOR) 20 MG tablet TAKE 1 TABLET (20 MG TOTAL) BY MOUTH DAILY. 90 tablet 3  . traMADol (ULTRAM) 50 MG tablet Take 1 tablet (50 mg total) by mouth every 4 (four) hours as needed for severe pain. 30 tablet 0  . traZODone (DESYREL) 100 MG tablet TAKE 1 TABLET BY MOUTH EVERYDAY AT BEDTIME 90 tablet 1  . zolpidem (AMBIEN CR) 6.25 MG CR tablet Take 6.25 mg by mouth at bedtime as needed for sleep.    Marland Kitchen amLODipine  (NORVASC) 10 MG tablet Take 1 tablet (10 mg total) by mouth daily. 30 tablet 1  . benazepril (LOTENSIN) 20 MG tablet TAKE 1 TABLET BY MOUTH EVERY DAY 90 tablet 1   No facility-administered medications prior to visit.      Allergies:   Acetaminophen; Asa [aspirin]; Ibuprofen; and Other   Social History   Socioeconomic History  . Marital status: Widowed    Spouse name: None  . Number of children: None  . Years of education: None  . Highest education level: None  Social Needs  . Financial resource strain: None  . Food insecurity - worry: None  . Food insecurity - inability: None  . Transportation needs - medical: None  . Transportation needs - non-medical: None  Occupational History  . None  Tobacco Use  . Smoking status: Current Every Day Smoker    Packs/day: 0.25    Types: Cigarettes  . Smokeless tobacco: Never Used  Substance and Sexual Activity  . Alcohol use: Yes    Alcohol/week: 0.0 oz    Comment: occ glass of wine or mix drink  . Drug use: No  . Sexual activity: None  Other Topics Concern  . None  Social History Narrative  . None     Family History:  The patient's family history includes Alcohol abuse in her maternal grandfather and maternal uncle; Alzheimer's disease in her mother; Arthritis in her maternal uncle; Asthma in her maternal grandmother; Cholecystitis in her maternal uncle; Diabetes in her maternal grandfather and paternal grandmother; Hyperlipidemia in her maternal grandfather and mother; Hypertension in her maternal grandfather and mother; Stroke in her maternal grandfather.   Review of Systems:   Please see the history of present illness.     General:  No chills, fever, night sweats or weight changes.  Cardiovascular:  No chest pain, edema, orthopnea, palpitations, paroxysmal nocturnal dyspnea. Positive for dyspnea on exertion (chronic).  Dermatological: No Mckeag, lesions/masses Respiratory: No cough, dyspnea Urologic: No hematuria,  dysuria Abdominal:   No nausea, vomiting, diarrhea, bright red blood per rectum, melena, or hematemesis Neurologic:  No visual changes, wkns, changes in mental status. All other systems reviewed and are otherwise negative except as noted above.   Physical Exam:    VS:  BP 118/62   Pulse 90   Ht 5' 2.5" (1.588 m)   Wt 140 lb (63.5 kg)   SpO2 98%   BMI 25.20 kg/m    General: Well developed, well nourished Caucasian female appearing in no acute distress. Head: Normocephalic, atraumatic, sclera non-icteric, no xanthomas, nares are without discharge.  Neck: No carotid bruits. JVD not elevated.  Lungs: Respirations regular and unlabored, without wheezes or rales.  Heart: Regular rate and rhythm. No S3 or S4.  No murmur, no rubs, or gallops  appreciated. Abdomen: Soft, non-tender, non-distended with normoactive bowel sounds. No hepatomegaly. No rebound/guarding. No obvious abdominal masses. Msk:  Strength and tone appear normal for age. No joint deformities or effusions. Extremities: No clubbing or cyanosis. No lower extremity edema.  Distal pedal pulses are 2+ bilaterally. Neuro: Alert and oriented X 3. Moves all extremities spontaneously. No focal deficits noted. Psych:  Responds to questions appropriately with a normal affect. Skin: No rashes or lesions noted  Wt Readings from Last 3 Encounters:  11/12/17 140 lb (63.5 kg)  10/27/17 142 lb 6.4 oz (64.6 kg)  05/16/17 144 lb 8 oz (65.5 kg)     Studies/Labs Reviewed:   EKG:  EKG is not ordered today.    Recent Labs: 05/09/2017: ALT 14; TSH 0.37 10/29/2017: Magnesium 1.9 11/12/2017: BUN 27; Creatinine, Ser 1.38; Hemoglobin 13.1; Platelets 256.0; Potassium 4.4; Sodium 139   Lipid Panel    Component Value Date/Time   CHOL 161 10/28/2017 0445   TRIG 367 (H) 10/28/2017 0445   HDL 27 (L) 10/28/2017 0445   CHOLHDL 6.0 10/28/2017 0445   VLDL 73 (H) 10/28/2017 0445   LDLCALC 61 10/28/2017 0445   LDLDIRECT 116.0 11/05/2016 0815     Additional studies/ records that were reviewed today include:   Echocardiogram: 10/29/2017 Study Conclusions  - Left ventricle: The cavity size was normal. Wall thickness was   increased in a pattern of mild LVH. Systolic function was normal.   The estimated ejection fraction was in the range of 55% to 60%.   Doppler parameters are consistent with abnormal left ventricular   relaxation (grade 1 diastolic dysfunction). Doppler parameters   are consistent with high ventricular filling pressure. - Aortic valve: Mildly calcified annulus. Trileaflet; mildly   thickened leaflets. Valve area (VTI): 3.08 cm^2. Valve area   (Vmax): 2.64 cm^2. - Mitral valve: Mildly calcified annulus. Mildly thickened leaflets   . - Atrial septum: No defect or patent foramen ovale was identified. - Technically dificult study.  NST: 10/29/2017  There was no ST segment deviation noted during stress.  The study is normal. There are no perfusion defects consistent with prior infarct or current ischemia.  This is a low risk study.  The left ventricular ejection fraction is normal (55-65%).  Assessment:    1. Atypical chest pain   2. LBBB (left bundle branch block)   3. Essential hypertension   4. Mixed hyperlipidemia   5. Type 2 diabetes mellitus with nephropathy ([place])   6. CKD (chronic kidney disease) stage 3, GFR 30-59 ml/min (H[facility])   7. Tobacco use      Plan:   In order of problems listed above:  1. Chest Pain/ LBBB - the patient was recently admitted for chest pain which had overall atypical features for a cardiac etiology. Cyclic troponin values remained negative but her EKG showed a presumed new LBBB. Echo showed a preserved EF of 55% to 60%, Grade 1 DD, and no regional WMA. A Lexiscan Myoview was also obtained and showed no significant ischemia.  - she denies any recurrent chest pain since hospital discharge. Has baseline dyspnea on exertion but denies any acute worsening of her symptoms.  No indication for further testing at this time. Will continue with risk factor modification. Remains on ASA, Amlodipine, and statin therapy.   2. HTN - BP is well controlled at 118/62 during today's visit. - Continue Amlodipine 10 mg daily.  3. HLD - FLP during recent admission showed total cholesterol at 161, HDL 27, and LDL  61. - continue Crestor 20mg  daily.   4. Type 2 DM - Hgb A1c well-controlled at 6.2 on most recent check.  - followed by PCP.   5. Stage 3 CKD - creatinine elevated to 1.59 at the time of hospital discharge on 1/22. Baseline 1.3 - 1.4. - repeat labs were obtained by her PCP this morning and showed creatinine has improved to 1.38.  6. Tobacco Use - she continues to smoke 5-10 cigarettes per day. Cessation advised.     Medication Adjustments/Labs and Tests Ordered: Current medicines are reviewed at length with the patient today.  Concerns regarding medicines are outlined above.  Medication changes, Labs and Tests ordered today are listed in the Patient Instructions below. Patient Instructions  Medication Instructions:  Your physician recommends that you continue on your current medications as directed. Please refer to the Current Medication list given to you today.  Labwork: NONE   Testing/Procedures: NONE   Follow-Up: Your physician wants you to follow-up in: 6 Months with Dr. Harl Bowie. You will receive a reminder letter in the mail two months in advance. If you don't receive a letter, please call our office to schedule the follow-up appointment.  Any Other Special Instructions Will Be Listed Below (If Applicable).  If you need a refill on your cardiac medications before your next appointment, please call your pharmacy.  Thank you for choosing [place]!    Signed, Erma Heritage, PA-C  11/12/2017 3:09 PM    [facility] [address] [place], [place] [address] Phone: [phone]

## 2017-11-12 ENCOUNTER — Ambulatory Visit (INDEPENDENT_AMBULATORY_CARE_PROVIDER_SITE_OTHER): Payer: Medicare Other | Admitting: Student

## 2017-11-12 ENCOUNTER — Other Ambulatory Visit (INDEPENDENT_AMBULATORY_CARE_PROVIDER_SITE_OTHER): Payer: Medicare Other

## 2017-11-12 ENCOUNTER — Encounter: Payer: Self-pay | Admitting: Student

## 2017-11-12 ENCOUNTER — Telehealth: Payer: Self-pay | Admitting: Family Medicine

## 2017-11-12 VITALS — BP 118/62 | HR 90 | Ht 62.5 in | Wt 140.0 lb

## 2017-11-12 DIAGNOSIS — D649 Anemia, unspecified: Secondary | ICD-10-CM

## 2017-11-12 DIAGNOSIS — I10 Essential (primary) hypertension: Secondary | ICD-10-CM | POA: Diagnosis not present

## 2017-11-12 DIAGNOSIS — N183 Chronic kidney disease, stage 3 unspecified: Secondary | ICD-10-CM

## 2017-11-12 DIAGNOSIS — E782 Mixed hyperlipidemia: Secondary | ICD-10-CM | POA: Diagnosis not present

## 2017-11-12 DIAGNOSIS — Z720 Tobacco use: Secondary | ICD-10-CM

## 2017-11-12 DIAGNOSIS — R0789 Other chest pain: Secondary | ICD-10-CM

## 2017-11-12 DIAGNOSIS — E1121 Type 2 diabetes mellitus with diabetic nephropathy: Secondary | ICD-10-CM | POA: Diagnosis not present

## 2017-11-12 DIAGNOSIS — I447 Left bundle-branch block, unspecified: Secondary | ICD-10-CM | POA: Diagnosis not present

## 2017-11-12 DIAGNOSIS — E039 Hypothyroidism, unspecified: Secondary | ICD-10-CM

## 2017-11-12 DIAGNOSIS — M810 Age-related osteoporosis without current pathological fracture: Secondary | ICD-10-CM

## 2017-11-12 LAB — CBC WITH DIFFERENTIAL/PLATELET
BASOS ABS: 0.1 10*3/uL (ref 0.0–0.1)
Basophils Relative: 1 % (ref 0.0–3.0)
EOS ABS: 0.4 10*3/uL (ref 0.0–0.7)
Eosinophils Relative: 6.3 % — ABNORMAL HIGH (ref 0.0–5.0)
HEMATOCRIT: 38.7 % (ref 36.0–46.0)
Hemoglobin: 13.1 g/dL (ref 12.0–15.0)
LYMPHS PCT: 26.4 % (ref 12.0–46.0)
Lymphs Abs: 1.7 10*3/uL (ref 0.7–4.0)
MCHC: 33.9 g/dL (ref 30.0–36.0)
MCV: 84 fl (ref 78.0–100.0)
Monocytes Absolute: 0.7 10*3/uL (ref 0.1–1.0)
Monocytes Relative: 11.4 % (ref 3.0–12.0)
NEUTROS ABS: 3.6 10*3/uL (ref 1.4–7.7)
Neutrophils Relative %: 54.9 % (ref 43.0–77.0)
PLATELETS: 256 10*3/uL (ref 150.0–400.0)
RBC: 4.6 Mil/uL (ref 3.87–5.11)
RDW: 13.9 % (ref 11.5–15.5)
WBC: 6.6 10*3/uL (ref 4.0–10.5)

## 2017-11-12 LAB — BASIC METABOLIC PANEL
BUN: 27 mg/dL — ABNORMAL HIGH (ref 6–23)
CALCIUM: 10.3 mg/dL (ref 8.4–10.5)
CO2: 32 meq/L (ref 19–32)
CREATININE: 1.38 mg/dL — AB (ref 0.40–1.20)
Chloride: 100 mEq/L (ref 96–112)
GFR: 40.32 mL/min — ABNORMAL LOW (ref >60.00)
Glucose, Bld: 131 mg/dL — ABNORMAL HIGH (ref 70–99)
Potassium: 4.4 mEq/L (ref 3.5–5.1)
Sodium: 139 mEq/L (ref 135–145)

## 2017-11-12 MED ORDER — AMLODIPINE BESYLATE 10 MG PO TABS: 10.0000 mg | ORAL_TABLET | Freq: Every day | ORAL | 3 refills | Status: DC

## 2017-11-12 NOTE — Telephone Encounter (Signed)
-----   Message from Ellamae Sia sent at 11/06/2017  9:13 AM EST ----- Regarding: Lab orders for Wednesday, 2.6.19 Lab orders for a 6 month follow up appt

## 2017-11-12 NOTE — Patient Instructions (Signed)
Medication Instructions:  Your physician recommends that you continue on your current medications as directed. Please refer to the Current Medication list given to you today.   Labwork: NONE   Testing/Procedures: NONE  Follow-Up: Your physician wants you to follow-up in: 6 Months with Dr. Branch. You will receive a reminder letter in the mail two months in advance. If you don't receive a letter, please call our office to schedule the follow-up appointment.   Any Other Special Instructions Will Be Listed Below (If Applicable).     If you need a refill on your cardiac medications before your next appointment, please call your pharmacy. Thank you for choosing [facility]!    

## 2017-11-19 ENCOUNTER — Telehealth: Payer: Self-pay | Admitting: Family Medicine

## 2017-11-19 ENCOUNTER — Encounter: Payer: Self-pay | Admitting: Family Medicine

## 2017-11-19 ENCOUNTER — Other Ambulatory Visit: Payer: Self-pay

## 2017-11-19 ENCOUNTER — Ambulatory Visit (INDEPENDENT_AMBULATORY_CARE_PROVIDER_SITE_OTHER): Payer: Medicare Other | Admitting: Family Medicine

## 2017-11-19 VITALS — BP 100/60 | HR 85 | Temp 98.5°F | Ht 62.5 in | Wt 138.5 lb

## 2017-11-19 DIAGNOSIS — N183 Chronic kidney disease, stage 3 unspecified: Secondary | ICD-10-CM

## 2017-11-19 DIAGNOSIS — F321 Major depressive disorder, single episode, moderate: Secondary | ICD-10-CM | POA: Diagnosis not present

## 2017-11-19 DIAGNOSIS — E1121 Type 2 diabetes mellitus with diabetic nephropathy: Secondary | ICD-10-CM | POA: Diagnosis not present

## 2017-11-19 DIAGNOSIS — I10 Essential (primary) hypertension: Secondary | ICD-10-CM

## 2017-11-19 DIAGNOSIS — K5900 Constipation, unspecified: Secondary | ICD-10-CM | POA: Diagnosis not present

## 2017-11-19 DIAGNOSIS — E7800 Pure hypercholesterolemia, unspecified: Secondary | ICD-10-CM | POA: Diagnosis not present

## 2017-11-19 LAB — MICROALBUMIN / CREATININE URINE RATIO
Creatinine,U: 126 mg/dL
Microalb Creat Ratio: 4 mg/g (ref 0.0–30.0)
Microalb, Ur: 5 mg/dL — ABNORMAL HIGH (ref 0.0–1.9)

## 2017-11-19 MED ORDER — LISINOPRIL 20 MG PO TABS: 20.0000 mg | ORAL_TABLET | Freq: Every day | ORAL | 11 refills | Status: DC

## 2017-11-19 NOTE — Assessment & Plan Note (Signed)
Decrease metformin as GRF dropping  Less than 45.  MAy need additional med... recheck in 3 months.  Encouraged exercise, weight loss, healthy eating habits.

## 2017-11-19 NOTE — Assessment & Plan Note (Signed)
Stable control on fluoxetine 

## 2017-11-19 NOTE — Assessment & Plan Note (Signed)
Well controlled. Continue current medication.  

## 2017-11-19 NOTE — Telephone Encounter (Signed)
Rx sent in. Correction to previous note.. Have pt make an appt with me for BP check in 2 weeks on new med (bring measurements) and I will check labs that day.

## 2017-11-19 NOTE — Assessment & Plan Note (Signed)
Avoid NSAIDs, increase water. Check microalbumin.. Will liekly need addition of ACEI/ARB.

## 2017-11-19 NOTE — Patient Instructions (Addendum)
Start fiber supplement.Marland Kitchen benefiber supplement. Could also try adding probiotics.  Increase water intake.  For acute constipation.. miralax 17 g  Prn.   Decrease metformin to 500 mg twice daily , can cut dose in half.  Stop at lab for  Urine microalbumin.

## 2017-11-19 NOTE — Progress Notes (Signed)
Subjective:    Patient ID: April Ross, female    DOB: [birth year], 67 y.o.   MRN: [id number]  HPI   67 year old female presents for 6 month  follow up DM.   Since last seen... See in ED for acute chest pain. Was recently admitted to [facility] from 1/20 - 10/29/2017 for evaluation of chest pain which she described as a pressure centered along her chest and radiating to her back at times. Her pain intensified at various times, sometimes worse with activity and other times with positional changes. Cyclic troponin values remained negative and her EKG showed a presumed new LBBB as there were no prior tracings available for comparsion. An echocardiogram was obtained and showed a preserved EF of 55% to 60%, Grade 1 DD, and no regional WMA. A Lexiscan Myoview was obtained and showed no perfusion defects consistent with prior infarct or current ischemia, overall being a low-risk study.    Followed up with Cardiology Strader PA on 11/12/2017  Noted reviewed in detail. She feels stress/ anxiety related.. Had stressful event proceeding.  Diabetes:   Well controlled metformin Lab Results  Component Value Date   HGBA1C 6.2 (H) 10/28/2017  Using medications without difficulties: Hypoglycemic episodes: none Hyperglycemic episodes: none Feet problems:no ulcers Blood Sugars averaging: not checking eye exam within last year:  Scheduled next week.  Hypertension:  Good control on amlodipine Using medication without problems or lightheadedness:  Chest pain with exertion: none.. Rarely has had pain in chest at rest when stressed out. Edema: none Short of breath: none Average home BPs: good. Other issues:  Elevated Cholesterol:  LDL at goal on crestor Lab Results  Component Value Date   CHOL 161 10/28/2017   HDL 27 (L) 10/28/2017   LDLCALC 61 10/28/2017   LDLDIRECT 116.0 11/05/2016   TRIG 367 (H) 10/28/2017   CHOLHDL 6.0 10/28/2017  Using medications without problems: Muscle aches:  Diet  compliance: good Exercise: walking Other complaints:    Stage 3 CKD: creatinine elevated to 1.59 at the time of hospital discharge on 1/22. Baseline 1.3 - 1.4. - repeat labs were obtained 11/12/2017 showed creatinine has improved to 1.38.  Due for micoralbumin check.   NOT on ACEI/ARB yet.   She has noted new constipation lately in last 5 months.  Occ straining with BM. No change in caliber of stool.  Has been trying to eat healthier foods. No meds.  Last colonoscopy 2016.. Nml, repeat. No family history of colon cancer known.   Blood pressure 100/60, pulse 85, temperature 98.5 F (36.9 C), temperature source Oral, height 5' 2.5" (1.588 m), weight 138 lb 8 oz (62.8 kg).  Wt Readings from Last 3 Encounters:  11/19/17 138 lb 8 oz (62.8 kg)  11/12/17 140 lb (63.5 kg)  10/27/17 142 lb 6.4 oz (64.6 kg)    Review of Systems     Objective:   Physical Exam  Constitutional: Vital signs are normal. She appears well-developed and well-nourished. She is cooperative.  Non-toxic appearance. She does not appear ill. No distress.  HENT:  Head: Normocephalic.  Right Ear: Hearing, tympanic membrane, external ear and ear canal normal. Tympanic membrane is not erythematous, not retracted and not bulging.  Left Ear: Hearing, tympanic membrane, external ear and ear canal normal. Tympanic membrane is not erythematous, not retracted and not bulging.  Nose: No mucosal edema or rhinorrhea. Right sinus exhibits no maxillary sinus tenderness and no frontal sinus tenderness. Left sinus exhibits no maxillary sinus tenderness  and no frontal sinus tenderness.  Mouth/Throat: Uvula is midline, oropharynx is clear and moist and mucous membranes are normal.  Eyes: Conjunctivae, EOM and lids are normal. Pupils are equal, round, and reactive to light. Lids are everted and swept, no foreign bodies found.  Neck: Trachea normal and normal range of motion. Neck supple. Carotid bruit is not present. No thyroid mass and no  thyromegaly present.  Cardiovascular: Normal rate, regular rhythm, S1 normal, S2 normal, normal heart sounds, intact distal pulses and normal pulses. Exam reveals no gallop and no friction rub.  No murmur heard. Pulmonary/Chest: Effort normal and breath sounds normal. No tachypnea. No respiratory distress. She has no decreased breath sounds. She has no wheezes. She has no rhonchi. She has no rales.  Abdominal: Soft. Normal appearance and bowel sounds are normal. There is no tenderness.  Neurological: She is alert.  Skin: Skin is warm, dry and intact. No Badeaux noted.  Psychiatric: Her speech is normal and behavior is normal. Judgment and thought content normal. Her mood appears not anxious. Cognition and memory are normal. She does not exhibit a depressed mood.    Diabetic foot exam: Normal inspection No skin breakdown No calluses  Normal DP pulses Normal sensation to light touch and monofilament Nails normal        Assessment & Plan:

## 2017-11-19 NOTE — Assessment & Plan Note (Signed)
Well controlled. Continue current medication.. If add ACEI.. BP may drop lower so likely would need to decreased or change amlodipine.

## 2017-11-19 NOTE — Telephone Encounter (Signed)
Ms. Borges notified as instructed by telephone.  Appointment scheduled for 12/03/2017 at 10:30 am with Dr. Diona Browner for BP check.

## 2017-11-19 NOTE — Assessment & Plan Note (Signed)
No red flags. Uptodate with colon ca [place].. 3 years ago nml. Increase fiber and water in diet. Can try probiotics.

## 2017-11-19 NOTE — Telephone Encounter (Signed)
-----   Message from Carter Kitten, [place] sent at 11/19/2017  4:14 PM EST ----- Ms. Choo notified as instructed by telephone.  She is agreeable to changing from amlodipine to lisinopril.  Please send Rx to [facility] in  [place].  She will call back to schedule her lab appointment in 2 weeks.

## 2017-11-22 DIAGNOSIS — E119 Type 2 diabetes mellitus without complications: Secondary | ICD-10-CM | POA: Diagnosis not present

## 2017-11-28 ENCOUNTER — Other Ambulatory Visit: Payer: Self-pay | Admitting: Family Medicine

## 2017-11-29 ENCOUNTER — Other Ambulatory Visit: Payer: Self-pay | Admitting: Family Medicine

## 2017-12-03 ENCOUNTER — Other Ambulatory Visit: Payer: Self-pay

## 2017-12-03 ENCOUNTER — Encounter: Payer: Self-pay | Admitting: Family Medicine

## 2017-12-03 ENCOUNTER — Ambulatory Visit (INDEPENDENT_AMBULATORY_CARE_PROVIDER_SITE_OTHER): Payer: Medicare Other | Admitting: Family Medicine

## 2017-12-03 VITALS — BP 140/60 | HR 77 | Temp 98.5°F | Ht 62.5 in | Wt 143.0 lb

## 2017-12-03 DIAGNOSIS — E1121 Type 2 diabetes mellitus with diabetic nephropathy: Secondary | ICD-10-CM

## 2017-12-03 DIAGNOSIS — N183 Chronic kidney disease, stage 3 unspecified: Secondary | ICD-10-CM

## 2017-12-03 DIAGNOSIS — I10 Essential (primary) hypertension: Secondary | ICD-10-CM | POA: Diagnosis not present

## 2017-12-03 LAB — BASIC METABOLIC PANEL
BUN: 15 mg/dL (ref 6–23)
CHLORIDE: 104 meq/L (ref 96–112)
CO2: 31 meq/L (ref 19–32)
CREATININE: 1.38 mg/dL — AB (ref 0.40–1.20)
Calcium: 10.1 mg/dL (ref 8.4–10.5)
GFR: 40.32 mL/min — ABNORMAL LOW (ref >60.00)
Glucose, Bld: 75 mg/dL (ref 70–99)
Potassium: 4.3 mEq/L (ref 3.5–5.1)
SODIUM: 140 meq/L (ref 135–145)

## 2017-12-03 MED ORDER — LISINOPRIL 20 MG PO TABS: 20.0000 mg | ORAL_TABLET | Freq: Every day | ORAL | 3 refills | Status: DC

## 2017-12-03 MED ORDER — TRAMADOL HCL 50 MG PO TABS: 50.0000 mg | ORAL_TABLET | ORAL | 0 refills | Status: DC | PRN

## 2017-12-03 NOTE — Progress Notes (Signed)
   Subjective:    Patient ID: April Ross, female    DOB: May 08, 1951, 67 y.o.   MRN: [id number]  HPI    67 year old female presents for follow up HTN   At last appt 2/12 we decreased metformin as GFR was < 45. Blood sugars at home FBS 130-137.  Hypertension:   Given microalbuminuria... BP med changed from amlodipine to lisinopril 20 mg daily.   minimal SE. BP Readings from Last 3 Encounters:  12/03/17 140/60  11/19/17 100/60  11/12/17 118/62  Using medication without problems or lightheadedness:  Chest pain with exertion: none Edema:none Short of breath: none Average home BPs: At home 110-127/63-74 Other issues:   Needs refill of  Tramadol that she uses for severe tension headache as does not tolerate acetaminophen or ibuprofen..  Causes [place]  Review of Systems  Constitutional: Negative for fatigue and fever.  HENT: Negative for ear pain.   Eyes: Negative for pain.  Respiratory: Negative for chest tightness and shortness of breath.        Mild cough with BP meds  Cardiovascular: Negative for chest pain, palpitations and leg swelling.  Gastrointestinal: Negative for abdominal pain.  Genitourinary: Negative for dysuria.       Objective:   Physical Exam  Constitutional: Vital signs are normal. She appears well-developed and well-nourished. She is cooperative.  Non-toxic appearance. She does not appear ill. No distress.  HENT:  Head: Normocephalic.  Right Ear: Hearing, tympanic membrane, external ear and ear canal normal. Tympanic membrane is not erythematous, not retracted and not bulging.  Left Ear: Hearing, tympanic membrane, external ear and ear canal normal. Tympanic membrane is not erythematous, not retracted and not bulging.  Nose: No mucosal edema or rhinorrhea. Right sinus exhibits no maxillary sinus tenderness and no frontal sinus tenderness. Left sinus exhibits no maxillary sinus tenderness and no frontal sinus tenderness.  Mouth/Throat: Uvula is midline,  oropharynx is clear and moist and mucous membranes are normal.  Eyes: Conjunctivae, EOM and lids are normal. Pupils are equal, round, and reactive to light. Lids are everted and swept, no foreign bodies found.  Neck: Trachea normal and normal range of motion. Neck supple. Carotid bruit is not present. No thyroid mass and no thyromegaly present.  Cardiovascular: Normal rate, regular rhythm, S1 normal, S2 normal, normal heart sounds, intact distal pulses and normal pulses. Exam reveals no gallop and no friction rub.  No murmur heard. Pulmonary/Chest: Effort normal and breath sounds normal. No tachypnea. No respiratory distress. She has no decreased breath sounds. She has no wheezes. She has no rhonchi. She has no rales.  Abdominal: Soft. Normal appearance and bowel sounds are normal. There is no tenderness.  Neurological: She is alert.  Skin: Skin is warm, dry and intact. No Gamarra noted.  Psychiatric: Her speech is normal and behavior is normal. Judgment and thought content normal. Her mood appears not anxious. Cognition and memory are normal. She does not exhibit a depressed mood.          Assessment & Plan:

## 2017-12-03 NOTE — Assessment & Plan Note (Signed)
Tolerable control on lower dose metformin. If A1C increased in 3 months .Marland Kitchen May need additional med as we cannot increase metformin back up given GFR.

## 2017-12-03 NOTE — Patient Instructions (Addendum)
Please stop at the lab to have labs drawn. Continue current meds.

## 2017-12-03 NOTE — Assessment & Plan Note (Signed)
Good control on new med. Encouraged exercise, weight loss, healthy eating habits. Re-eval Cr after ACEI change

## 2017-12-03 NOTE — Assessment & Plan Note (Signed)
No on ACE and on lower dose metfomrin for safety.

## 2017-12-06 ENCOUNTER — Other Ambulatory Visit: Payer: Self-pay | Admitting: *Deleted

## 2017-12-06 MED ORDER — METFORMIN HCL 500 MG PO TABS: 500.0000 mg | ORAL_TABLET | Freq: Two times a day (BID) | ORAL | 1 refills | Status: DC

## 2017-12-18 DIAGNOSIS — M79645 Pain in left finger(s): Secondary | ICD-10-CM | POA: Diagnosis not present

## 2017-12-18 DIAGNOSIS — S63602A Unspecified sprain of left thumb, initial encounter: Secondary | ICD-10-CM | POA: Diagnosis not present

## 2017-12-18 DIAGNOSIS — S60012A Contusion of left thumb without damage to nail, initial encounter: Secondary | ICD-10-CM | POA: Diagnosis not present

## 2017-12-28 ENCOUNTER — Other Ambulatory Visit: Payer: Self-pay | Admitting: Family Medicine

## 2017-12-31 ENCOUNTER — Other Ambulatory Visit: Payer: Self-pay | Admitting: Family Medicine

## 2018-02-18 ENCOUNTER — Telehealth: Payer: Self-pay | Admitting: Family Medicine

## 2018-02-18 ENCOUNTER — Other Ambulatory Visit (INDEPENDENT_AMBULATORY_CARE_PROVIDER_SITE_OTHER): Payer: Medicare Other

## 2018-02-18 DIAGNOSIS — E039 Hypothyroidism, unspecified: Secondary | ICD-10-CM

## 2018-02-18 DIAGNOSIS — E1121 Type 2 diabetes mellitus with diabetic nephropathy: Secondary | ICD-10-CM

## 2018-02-18 DIAGNOSIS — M810 Age-related osteoporosis without current pathological fracture: Secondary | ICD-10-CM

## 2018-02-18 DIAGNOSIS — E7800 Pure hypercholesterolemia, unspecified: Secondary | ICD-10-CM

## 2018-02-18 LAB — COMPREHENSIVE METABOLIC PANEL
ALT: 13 U/L (ref 0–35)
AST: 13 U/L (ref 0–37)
Albumin: 4.3 g/dL (ref 3.5–5.2)
Alkaline Phosphatase: 42 U/L (ref 39–117)
BUN: 19 mg/dL (ref 6–23)
CHLORIDE: 105 meq/L (ref 96–112)
CO2: 29 mEq/L (ref 19–32)
Calcium: 9.9 mg/dL (ref 8.4–10.5)
Creatinine, Ser: 1.41 mg/dL — ABNORMAL HIGH (ref 0.40–1.20)
GFR: 39.3 mL/min — AB (ref >60.00)
Glucose, Bld: 147 mg/dL — ABNORMAL HIGH (ref 70–99)
POTASSIUM: 4.5 meq/L (ref 3.5–5.1)
SODIUM: 142 meq/L (ref 135–145)
Total Bilirubin: 0.4 mg/dL (ref 0.2–1.2)
Total Protein: 7 g/dL (ref 6.0–8.3)

## 2018-02-18 LAB — VITAMIN D 25 HYDROXY (VIT D DEFICIENCY, FRACTURES): VITD: 49.99 ng/mL (ref 30.00–100.00)

## 2018-02-18 LAB — T3, FREE: T3, Free: 3.5 pg/mL (ref 2.3–4.2)

## 2018-02-18 LAB — LIPID PANEL
CHOL/HDL RATIO: 5
CHOLESTEROL: 152 mg/dL (ref 0–200)
HDL: 33.6 mg/dL — AB (ref >39.00)
NonHDL: 118.19
Triglycerides: 205 mg/dL — ABNORMAL HIGH (ref 0.0–149.0)
VLDL: 41 mg/dL — AB (ref 0.0–40.0)

## 2018-02-18 LAB — HEMOGLOBIN A1C: Hgb A1c MFr Bld: 6.7 % — ABNORMAL HIGH (ref 4.6–6.5)

## 2018-02-18 LAB — T4, FREE: FREE T4: 1.2 ng/dL (ref 0.60–1.60)

## 2018-02-18 LAB — LDL CHOLESTEROL, DIRECT: LDL DIRECT: 98 mg/dL

## 2018-02-18 LAB — TSH: TSH: 0.36 u[IU]/mL (ref 0.35–4.50)

## 2018-02-18 NOTE — Telephone Encounter (Signed)
-----   Message from Ellamae Sia sent at 02/11/2018 11:41 AM EDT ----- Regarding: Lab orders for Tuesday, 5.14.19 Patient is scheduled for CPX labs, please order future labs, Thanks , Karna Christmas

## 2018-02-21 ENCOUNTER — Other Ambulatory Visit: Payer: Self-pay | Admitting: Family Medicine

## 2018-02-25 ENCOUNTER — Ambulatory Visit (INDEPENDENT_AMBULATORY_CARE_PROVIDER_SITE_OTHER): Payer: Medicare Other | Admitting: Family Medicine

## 2018-02-25 ENCOUNTER — Other Ambulatory Visit: Payer: Self-pay | Admitting: Family Medicine

## 2018-02-25 ENCOUNTER — Encounter: Payer: Self-pay | Admitting: Family Medicine

## 2018-02-25 VITALS — BP 136/72 | HR 59 | Temp 98.5°F | Ht 62.5 in | Wt 140.0 lb

## 2018-02-25 DIAGNOSIS — F5104 Psychophysiologic insomnia: Secondary | ICD-10-CM | POA: Diagnosis not present

## 2018-02-25 DIAGNOSIS — N183 Chronic kidney disease, stage 3 unspecified: Secondary | ICD-10-CM

## 2018-02-25 DIAGNOSIS — E039 Hypothyroidism, unspecified: Secondary | ICD-10-CM

## 2018-02-25 DIAGNOSIS — I10 Essential (primary) hypertension: Secondary | ICD-10-CM

## 2018-02-25 DIAGNOSIS — E1121 Type 2 diabetes mellitus with diabetic nephropathy: Secondary | ICD-10-CM

## 2018-02-25 DIAGNOSIS — E7800 Pure hypercholesterolemia, unspecified: Secondary | ICD-10-CM | POA: Diagnosis not present

## 2018-02-25 NOTE — Patient Instructions (Addendum)
Hold multivitamin. Call if diarrhea not improving.  Continue your other meds at this time.  Cancel appt in 05/2018 and labs

## 2018-02-25 NOTE — Assessment & Plan Note (Signed)
Good control despite lower dose metfomrin. MAy need to D/C altoghther if causing diarrhea.

## 2018-02-25 NOTE — Assessment & Plan Note (Signed)
Well controlled with trazodone at night.

## 2018-02-25 NOTE — Assessment & Plan Note (Signed)
Stable

## 2018-02-25 NOTE — Assessment & Plan Note (Signed)
Well controlled. Continue current medication.  

## 2018-02-25 NOTE — Assessment & Plan Note (Signed)
At goal.  

## 2018-02-25 NOTE — Progress Notes (Signed)
   Subjective:    Patient ID: April Ross, female    DOB: 11-29-1950, 67 y.o.   MRN: [id number]  HPI   67 year old female presents for 3 month follow up.  Diabetes:   Tolerable control on low dose metformin. Lab Results  Component Value Date   HGBA1C 6.7 (H) 02/18/2018  Using medications without difficulties: She has had several episode. Hypoglycemic episodes: occ Hyperglycemic episodes:none Feet problems: noneBlood Sugars averaging:FBS 100-120 eye exam within last year:  Hypertension:    Good control on lisinopril ( has microalbuminuria) BP Readings from Last 3 Encounters:  02/25/18 136/72  12/03/17 140/60  11/19/17 100/60  using medication without problems or lightheadedness:  none Chest pain with exertion: none Edema: none Short of breath: none Average home BPs: Other issues:  CKD;stable.  Elevated Cholesterol: At goal.Using medications without problems: Muscle aches:  Diet compliance: good Exercise: walking some.. More in warm weather. Other complaints:   Social History /Family History/Past Medical History reviewed in detail and updated in EMR if needed. Blood pressure 136/72, pulse (!) 59, temperature 98.5 F (36.9 C), temperature source Oral, height 5' 2.5" (1.588 m), weight 140 lb (63.5 kg), SpO2 97 %.  Review of Systems  Constitutional: Negative for fatigue and fever.  HENT: Negative for congestion.   Eyes: Negative for pain.  Respiratory: Negative for cough and shortness of breath.   Cardiovascular: Negative for chest pain, palpitations and leg swelling.  Gastrointestinal: Negative for abdominal pain.  Genitourinary: Negative for dysuria and vaginal bleeding.  Musculoskeletal: Negative for back pain.  Neurological: Negative for syncope, light-headedness and headaches.  Psychiatric/Behavioral: Negative for dysphoric mood.       Objective:   Physical Exam  Constitutional: Vital signs are normal. She appears well-developed and well-nourished. She is  cooperative.  Non-toxic appearance. She does not appear ill. No distress.  HENT:  Head: Normocephalic.  Right Ear: Hearing, tympanic membrane, external ear and ear canal normal. Tympanic membrane is not erythematous, not retracted and not bulging.  Left Ear: Hearing, tympanic membrane, external ear and ear canal normal. Tympanic membrane is not erythematous, not retracted and not bulging.  Nose: No mucosal edema or rhinorrhea. Right sinus exhibits no maxillary sinus tenderness and no frontal sinus tenderness. Left sinus exhibits no maxillary sinus tenderness and no frontal sinus tenderness.  Mouth/Throat: Uvula is midline, oropharynx is clear and moist and mucous membranes are normal.  Eyes: Pupils are equal, round, and reactive to light. Conjunctivae, EOM and lids are normal. Lids are everted and swept, no foreign bodies found.  Neck: Trachea normal and normal range of motion. Neck supple. Carotid bruit is not present. No thyroid mass and no thyromegaly present.  Cardiovascular: Normal rate, regular rhythm, S1 normal, S2 normal, normal heart sounds, intact distal pulses and normal pulses. Exam reveals no gallop and no friction rub.  No murmur heard. Pulmonary/Chest: Effort normal and breath sounds normal. No tachypnea. No respiratory distress. She has no decreased breath sounds. She has no wheezes. She has no rhonchi. She has no rales.  Abdominal: Soft. Normal appearance and bowel sounds are normal. There is no tenderness.  Neurological: She is alert.  Skin: Skin is warm, dry and intact. No Savo noted.  Psychiatric: Her speech is normal and behavior is normal. Judgment and thought content normal. Her mood appears not anxious. Cognition and memory are normal. She does not exhibit a depressed mood.          Assessment & Plan:

## 2018-03-30 ENCOUNTER — Other Ambulatory Visit: Payer: Self-pay | Admitting: Family Medicine

## 2018-03-31 ENCOUNTER — Other Ambulatory Visit: Payer: Self-pay | Admitting: Family Medicine

## 2018-04-01 ENCOUNTER — Other Ambulatory Visit: Payer: Self-pay | Admitting: Family Medicine

## 2018-04-07 ENCOUNTER — Other Ambulatory Visit: Payer: Self-pay | Admitting: Family Medicine

## 2018-05-10 ENCOUNTER — Other Ambulatory Visit: Payer: Self-pay | Admitting: Family Medicine

## 2018-05-12 ENCOUNTER — Other Ambulatory Visit: Payer: Self-pay | Admitting: Family Medicine

## 2018-05-12 NOTE — Telephone Encounter (Signed)
Rx faxed to [facility] in [place] at [phone].

## 2018-05-13 ENCOUNTER — Ambulatory Visit: Payer: Medicare Other

## 2018-05-15 ENCOUNTER — Ambulatory Visit: Payer: Medicare Other

## 2018-05-20 ENCOUNTER — Encounter: Payer: Medicare Other | Admitting: Family Medicine

## 2018-05-23 ENCOUNTER — Encounter: Payer: Medicare Other | Admitting: Family Medicine

## 2018-06-06 ENCOUNTER — Encounter: Payer: Self-pay | Admitting: Family Medicine

## 2018-06-06 ENCOUNTER — Ambulatory Visit (INDEPENDENT_AMBULATORY_CARE_PROVIDER_SITE_OTHER): Payer: Medicare Other | Admitting: Family Medicine

## 2018-06-06 VITALS — BP 120/60 | HR 71 | Temp 99.3°F | Ht 62.5 in | Wt 143.8 lb

## 2018-06-06 DIAGNOSIS — R197 Diarrhea, unspecified: Secondary | ICD-10-CM | POA: Diagnosis not present

## 2018-06-06 LAB — CBC WITH DIFFERENTIAL/PLATELET
BASOS PCT: 0.5 % (ref 0.0–3.0)
Basophils Absolute: 0 10*3/uL (ref 0.0–0.1)
EOS ABS: 0.2 10*3/uL (ref 0.0–0.7)
EOS PCT: 2.6 % (ref 0.0–5.0)
HCT: 34.6 % — ABNORMAL LOW (ref 36.0–46.0)
Hemoglobin: 11.9 g/dL — ABNORMAL LOW (ref 12.0–15.0)
LYMPHS ABS: 2.1 10*3/uL (ref 0.7–4.0)
Lymphocytes Relative: 24.6 % (ref 12.0–46.0)
MCHC: 34.4 g/dL (ref 30.0–36.0)
MCV: 83.4 fl (ref 78.0–100.0)
MONO ABS: 1 10*3/uL (ref 0.1–1.0)
Monocytes Relative: 11.6 % (ref 3.0–12.0)
NEUTROS PCT: 60.7 % (ref 43.0–77.0)
Neutro Abs: 5.1 10*3/uL (ref 1.4–7.7)
Platelets: 253 10*3/uL (ref 150.0–400.0)
RBC: 4.15 Mil/uL (ref 3.87–5.11)
RDW: 14.1 % (ref 11.5–15.5)
WBC: 8.4 10*3/uL (ref 4.0–10.5)

## 2018-06-06 NOTE — Assessment & Plan Note (Signed)
Eval with cbc and GI panel but infection unlikely. No recent antibiotics and no exposure to parasitic infection. Most likely due to IBS. reviewed treatment course.Marland Kitchen Restart probiotics, increase fiber , water, decrease stress and greasy foods.  Trial of FOD MAP diet. If not improving consider referral for further eval with GI.

## 2018-06-06 NOTE — Progress Notes (Signed)
   Subjective:    Patient ID: April Ross, female    DOB: 1950/12/23, 67 y.o.   MRN: [id number]  HPI  67 year old female presents for new onset diarrhea x 2 weeks.  She has not had nausea, no abd pain, no fever.  She is very bloated and gassy. Relieved temporarily with BM.  No blood in stool. Stools water, slimy several times a day. Urgency to it.  Explosive nature to BM with lots of air. No unexpected weight loss.  Immodium and pepto helps temporarily.  No new med, no supplements. No antibiotics. No recent travel. Has city water.     She did have diarrhea 6 months ago. She has stopped Mg and multivitamin. She is on low dose metformin   No family hx of IBS  No personal history of bowel disease.   Colonoscopy nml in 2016  Blood pressure 120/60, pulse 71, temperature 99.3 F (37.4 C), temperature source Oral, height 5' 2.5" (1.588 m), weight 143 lb 12 oz (65.2 kg). Social History /Family History/Past Medical History reviewed in detail and updated in EMR if needed. Wt Readings from Last 3 Encounters:  06/06/18 143 lb 12 oz (65.2 kg)  02/25/18 140 lb (63.5 kg)  12/03/17 143 lb (64.9 kg)     Review of Systems  Constitutional: Negative for fatigue and fever.  HENT: Negative for congestion.   Eyes: Negative for pain.  Respiratory: Negative for cough and shortness of breath.   Cardiovascular: Negative for chest pain, palpitations and leg swelling.  Gastrointestinal: Negative for abdominal pain.  Genitourinary: Negative for dysuria and vaginal bleeding.  Musculoskeletal: Negative for back pain.  Neurological: Negative for syncope, light-headedness and headaches.  Psychiatric/Behavioral: Negative for dysphoric mood.       Objective:   Physical Exam  Constitutional: Vital signs are normal. She appears well-developed and well-nourished. She is cooperative.  Non-toxic appearance. She does not appear ill. No distress.  HENT:  Head: Normocephalic.  Right Ear: Hearing,  tympanic membrane, external ear and ear canal normal.  Left Ear: Hearing, tympanic membrane, external ear and ear canal normal.  Nose: Nose normal.  Eyes: Pupils are equal, round, and reactive to light. Conjunctivae, EOM and lids are normal. Lids are everted and swept, no foreign bodies found.  Neck: Trachea normal and normal range of motion. Neck supple. Carotid bruit is not present. No thyroid mass and no thyromegaly present.  Cardiovascular: Normal rate, regular rhythm, S1 normal, S2 normal, normal heart sounds and intact distal pulses. Exam reveals no gallop.  No murmur heard. Pulmonary/Chest: Effort normal and breath sounds normal. No respiratory distress. She has no wheezes. She has no rhonchi. She has no rales.  Abdominal: Soft. Normal appearance and bowel sounds are normal. She exhibits no distension, no fluid wave, no abdominal bruit and no mass. There is no hepatosplenomegaly. There is no tenderness. There is no rebound, no guarding and no CVA tenderness. No hernia.  Lymphadenopathy:    She has no cervical adenopathy.    She has no axillary adenopathy.  Neurological: She is alert. She has normal strength. No cranial nerve deficit or sensory deficit.  Skin: Skin is warm, dry and intact. No Noteboom noted.  Psychiatric: Her speech is normal and behavior is normal. Judgment normal. Her mood appears not anxious. Cognition and memory are normal. She does not exhibit a depressed mood.          Assessment & Plan:

## 2018-06-06 NOTE — Patient Instructions (Signed)
Please stop at the lab to have labs drawn.  Restart probiotic.  Start FODMAP Diet.  Increase fiber in diet or start benefiber. Increase water intake.  Avoid greasy foods, decrease stress.  Can use immodium as needed.   Irritable Bowel Syndrome, Adult Irritable bowel syndrome (IBS) is not one specific disease. It is a group of symptoms that affects the organs responsible for digestion (gastrointestinal or GI tract). To regulate how your GI tract works, your body sends signals back and forth between your intestines and your brain. If you have IBS, there may be a problem with these signals. As a result, your GI tract does not function normally. Your intestines may become more sensitive and overreact to certain things. This is especially true when you eat certain foods or when you are under stress. There are four types of IBS. These may be determined based on the consistency of your stool:  IBS with diarrhea.  IBS with constipation.  Mixed IBS.  Unsubtyped IBS.  It is important to know which type of IBS you have. Some treatments are more likely to be helpful for certain types of IBS. What are the causes? The exact cause of IBS is not known. What increases the risk? You may have a higher risk of IBS if:  You are a woman.  You are younger than 69 years old.  You have a family history of IBS.  You have mental health problems.  You have had bacterial infection of your GI tract.  What are the signs or symptoms? Symptoms of IBS vary from person to person. The main symptom is abdominal pain or discomfort. Additional symptoms usually include one or more of the following:  Diarrhea, constipation, or both.  Abdominal swelling or bloating.  Feeling full or sick after eating a small or regular-size meal.  Frequent gas.  Mucus in the stool.  A feeling of having more stool left after a bowel movement.  Symptoms tend to come and go. They may be associated with stress, psychiatric  conditions, or nothing at all. How is this diagnosed? There is no specific test to diagnose IBS. Your health care provider will make a diagnosis based on a physical exam, medical history, and your symptoms. You may have other tests to rule out other conditions that may be causing your symptoms. These may include:  Blood tests.  X-rays.  CT scan.  Endoscopy and colonoscopy. This is a test in which your GI tract is viewed with a long, thin, flexible tube.  How is this treated? There is no cure for IBS, but treatment can help relieve symptoms. IBS treatment often includes:  Changes to your diet, such as: ? Eating more fiber. ? Avoiding foods that cause symptoms. ? Drinking more water. ? Eating regular, medium-sized portioned meals.  Medicines. These may include: ? Fiber supplements if you have constipation. ? Medicine to control diarrhea (antidiarrheal medicines). ? Medicine to help control muscle spasms in your GI tract (antispasmodic medicines). ? Medicines to help with any mental health issues, such as antidepressants or tranquilizers.  Therapy. ? Talk therapy may help with anxiety, depression, or other mental health issues that can make IBS symptoms worse.  Stress reduction. ? Managing your stress can help keep symptoms under control.  Follow these instructions at home:  Take medicines only as directed by your health care provider.  Eat a healthy diet. ? Avoid foods and drinks with added sugar. ? Include more whole grains, fruits, and vegetables gradually into your diet.  This may be especially helpful if you have IBS with constipation. ? Avoid any foods and drinks that make your symptoms worse. These may include dairy products and caffeinated or carbonated drinks. ? Do not eat large meals. ? Drink enough fluid to keep your urine clear or pale yellow.  Exercise regularly. Ask your health care provider for recommendations of good activities for you.  Keep all follow-up  visits as directed by your health care provider. This is important. Contact a health care provider if:  You have constant pain.  You have trouble or pain with swallowing.  You have worsening diarrhea. Get help right away if:  You have severe and worsening abdominal pain.  You have diarrhea and: ? You have a Degrasse, stiff neck, or severe headache. ? You are irritable, sleepy, or difficult to awaken. ? You are weak, dizzy, or extremely thirsty.  You have bright red blood in your stool or you have black tarry stools.  You have unusual abdominal swelling that is painful.  You vomit continuously.  You vomit blood (hematemesis).  You have both abdominal pain and a fever. This information is not intended to replace advice given to you by your health care provider. Make sure you discuss any questions you have with your health care provider. Document Released: 09/24/2005 Document Revised: 02/24/2016 Document Reviewed: 06/11/2014 Elsevier Interactive Patient Education  2018 [employer].

## 2018-06-10 DIAGNOSIS — R197 Diarrhea, unspecified: Secondary | ICD-10-CM | POA: Diagnosis not present

## 2018-06-12 LAB — GASTROINTESTINAL PATHOGEN PANEL PCR
C. difficile Tox A/B, PCR: NOT DETECTED
CAMPYLOBACTER, PCR: NOT DETECTED
Cryptosporidium, PCR: NOT DETECTED
E coli (ETEC) LT/ST PCR: NOT DETECTED
E coli (STEC) stx1/stx2, PCR: NOT DETECTED
E coli 0157, PCR: NOT DETECTED
GIARDIA LAMBLIA, PCR: NOT DETECTED
NOROVIRUS, PCR: NOT DETECTED
Rotavirus A, PCR: NOT DETECTED
SALMONELLA, PCR: NOT DETECTED
SHIGELLA, PCR: NOT DETECTED

## 2018-06-23 ENCOUNTER — Other Ambulatory Visit: Payer: Self-pay | Admitting: Family Medicine

## 2018-06-28 ENCOUNTER — Emergency Department (HOSPITAL_COMMUNITY)
Admission: EM | Admit: 2018-06-28 | Discharge: 2018-06-28 | Disposition: A | Discharge status: Home / Self Care | Payer: Medicare Other | Attending: Emergency Medicine | Admitting: Emergency Medicine

## 2018-06-28 ENCOUNTER — Other Ambulatory Visit: Payer: Self-pay

## 2018-06-28 ENCOUNTER — Encounter (HOSPITAL_COMMUNITY): Payer: Self-pay | Admitting: Emergency Medicine

## 2018-06-28 DIAGNOSIS — E119 Type 2 diabetes mellitus without complications: Secondary | ICD-10-CM | POA: Diagnosis not present

## 2018-06-28 DIAGNOSIS — Y929 Unspecified place or not applicable: Secondary | ICD-10-CM | POA: Diagnosis not present

## 2018-06-28 DIAGNOSIS — B880 Other acariasis: Secondary | ICD-10-CM

## 2018-06-28 DIAGNOSIS — I129 Hypertensive chronic kidney disease with stage 1 through stage 4 chronic kidney disease, or unspecified chronic kidney disease: Secondary | ICD-10-CM | POA: Insufficient documentation

## 2018-06-28 DIAGNOSIS — Y939 Activity, unspecified: Secondary | ICD-10-CM | POA: Diagnosis not present

## 2018-06-28 DIAGNOSIS — F17210 Nicotine dependence, cigarettes, uncomplicated: Secondary | ICD-10-CM | POA: Insufficient documentation

## 2018-06-28 DIAGNOSIS — W57XXXA Bitten or stung by nonvenomous insect and other nonvenomous arthropods, initial encounter: Secondary | ICD-10-CM | POA: Insufficient documentation

## 2018-06-28 DIAGNOSIS — S20469A Insect bite (nonvenomous) of unspecified back wall of thorax, initial encounter: Secondary | ICD-10-CM | POA: Diagnosis not present

## 2018-06-28 DIAGNOSIS — Z7982 Long term (current) use of aspirin: Secondary | ICD-10-CM | POA: Insufficient documentation

## 2018-06-28 DIAGNOSIS — Z7984 Long term (current) use of oral hypoglycemic drugs: Secondary | ICD-10-CM | POA: Diagnosis not present

## 2018-06-28 DIAGNOSIS — N183 Chronic kidney disease, stage 3 (moderate): Secondary | ICD-10-CM | POA: Insufficient documentation

## 2018-06-28 DIAGNOSIS — R21 Rash and other nonspecific skin eruption: Secondary | ICD-10-CM | POA: Diagnosis not present

## 2018-06-28 DIAGNOSIS — E039 Hypothyroidism, unspecified: Secondary | ICD-10-CM | POA: Insufficient documentation

## 2018-06-28 DIAGNOSIS — Z79899 Other long term (current) drug therapy: Secondary | ICD-10-CM | POA: Diagnosis not present

## 2018-06-28 DIAGNOSIS — Y999 Unspecified external cause status: Secondary | ICD-10-CM | POA: Insufficient documentation

## 2018-06-28 MED ORDER — HYDROXYZINE HCL 25 MG PO TABS
50.0000 mg | ORAL_TABLET | Freq: Once | ORAL | Status: DC
  Filled 2018-06-28: qty 2

## 2018-06-28 MED ORDER — PREDNISONE 10 MG PO TABS: ORAL_TABLET | ORAL | 0 refills | Status: DC

## 2018-06-28 MED ORDER — HYDROXYZINE HCL 50 MG/ML IM SOLN
25.0000 mg | Freq: Once | INTRAMUSCULAR | Status: AC
  Administered 2018-06-28: 25 mg via INTRAMUSCULAR
  Filled 2018-06-28: qty 1

## 2018-06-28 MED ORDER — FAMOTIDINE 20 MG PO TABS
20.0000 mg | ORAL_TABLET | Freq: Once | ORAL | Status: AC
  Administered 2018-06-28: 20 mg via ORAL
  Filled 2018-06-28: qty 1

## 2018-06-28 MED ORDER — PREDNISONE 10 MG PO TABS
60.0000 mg | ORAL_TABLET | Freq: Once | ORAL | Status: AC
Start: 2018-06-28 — End: 2018-06-28
  Administered 2018-06-28: 60 mg via ORAL
  Filled 2018-06-28: qty 1

## 2018-06-28 MED ORDER — FAMOTIDINE 20 MG PO TABS: 20.0000 mg | ORAL_TABLET | Freq: Two times a day (BID) | ORAL | 0 refills | Status: DC

## 2018-06-28 MED ORDER — HYDROXYZINE HCL 25 MG PO TABS: 25.0000 mg | ORAL_TABLET | Freq: Four times a day (QID) | ORAL | 0 refills | Status: DC | PRN

## 2018-06-28 NOTE — Discharge Instructions (Addendum)
Take your next dose of prednisone tomorrow evening as you received todays dose here.  Cool compresses or bath soaks along may also help with itching.  Get rechecked for any new or worsened symptoms.

## 2018-06-28 NOTE — ED Triage Notes (Signed)
Pt c/o itching, thinks she got into chiggers

## 2018-06-29 ENCOUNTER — Other Ambulatory Visit: Payer: Self-pay | Admitting: Family Medicine

## 2018-06-30 NOTE — ED Provider Notes (Signed)
[facility] EMERGENCY DEPARTMENT Provider Note   CSN: [id number] Arrival date & time: 06/28/18  1918     History   Chief Complaint Chief Complaint  Patient presents with  . Pruritis    HPI April Ross is a 67 y.o. female presenting with a pruritic Sin[place]oli which has been present for the past 2 days, predominantly on her back, abdomen along her bras and waist line and upper extremities and at her ankles.  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  She denies pain but has extreme itching which has only partially been controlled by benadryl.  The history is provided by the patient.    Past Medical History:  Diagnosis Date  . Allergy   . Arthritis   . CRD (chronic renal disease)   . Depression   . Diabetes mellitus without complication ([place])   . GERD (gastroesophageal reflux disease)   . Hyperlipidemia   . Hypertension   . LBBB (left bundle branch block)    a. diagnosed in 10/2017 - echo showed a preserved EF of 55-60% and NST was low-risk showing no evidence of ischemia.   . polyps in colon   . Thyroid disease   . Urinary tract infection     Patient Active Problem List   Diagnosis Date Noted  . Acute diarrhea 06/06/2018  . Acute constipation 11/19/2017  . Chest pain 10/27/2017  . Type 2 diabetes mellitus with nephropathy ([place]) 10/27/2017  . Tobacco abuse 10/27/2017  . Nonspecific chest pain   . Intermittent lightheadedness 05/16/2017  . Osteoporosis 05/24/2016  . CKD (chronic kidney disease) stage 3, GFR 30-59 ml/min ([facility]) 05/08/2016  . Pure hypercholesterolemia 01/12/2016  . Controlled diabetes mellitus with diabetic nephropathy ([place]) 01/12/2016  . Hypothyroidism 01/12/2016  . Essential hypertension, benign 01/12/2016  . Allergic rhinitis 01/12/2016  . Moderate major depression, single episode ([place]) 01/12/2016  . Chronic insomnia 01/12/2016  . GERD  (gastroesophageal reflux disease) 01/12/2016    Past Surgical History:  Procedure Laterality Date  . ABDOMINAL HYSTERECTOMY    . APPENDECTOMY       OB History   None      Home Medications    Prior to Admission medications   Medication Sig Start Date End Date Taking? Authorizing Provider  alendronate (FOSAMAX) 70 MG tablet TAKE 1 TABLET EVERY 7 DAYS TAKE WITH A FULL GLASS OF WATER ON AN EMPTY STOMACH 05/12/18   Bedsole, Amy E, MD  aspirin EC 81 MG tablet Take 81 mg by mouth daily.    [provider]  Calcium Carb-Cholecalciferol (CALCIUM-VITAMIN D) 500-200 MG-UNIT tablet Take 1 tablet by mouth daily.    [provider]  cetirizine (ZYRTEC) 10 MG tablet Take 10 mg by mouth at bedtime.    [provider]  cyclobenzaprine (FLEXERIL) 10 MG tablet Take 1 tablet (10 mg total) by mouth 3 (three) times daily as needed for muscle spasms. 10/29/17   Orson Eva, MD  famotidine (PEPCID) 20 MG tablet Take 1 tablet (20 mg total) by mouth 2 (two) times daily. 06/28/18   Evalee Jefferson, PA-C  FLUoxetine (PROZAC) 20 MG capsule TAKE 1 CAPSULE BY MOUTH EVERY DAY 06/23/18   Bedsole, Amy E, MD  fluticasone (FLONASE) 50 MCG/ACT nasal spray PLACE 2 SPRAYS INTO BOTH NOSTRILS DAILY. 06/05/17   Bedsole, Amy E, MD  hydrOXYzine (ATARAX/VISTARIL) 25 MG tablet Take 1 tablet (25 mg total)  by mouth every 6 (six) hours as needed for itching. 06/28/18   Jadrien Narine, Almyra Free, PA-C  levothyroxine (SYNTHROID, LEVOTHROID) 88 MCG tablet Take 88 mcg by mouth daily. 12/16/15   [provider]  lisinopril (PRINIVIL,ZESTRIL) 20 MG tablet Take 1 tablet (20 mg total) by mouth daily. 12/03/17   Bedsole, Amy E, MD  metFORMIN (GLUCOPHAGE) 500 MG tablet Take 1 tablet (500 mg total) by mouth 2 (two) times daily with a meal. 12/06/17   Bedsole, Amy E, MD  omeprazole (PRILOSEC) 20 MG capsule TAKE 1 CAPSULE BY MOUTH EVERY DAY 02/25/18   Bedsole, Amy E, MD  predniSONE (DELTASONE) 10 MG tablet Take 6 tablets day one, 5 tablets  day two, 4 tablets day three, 3 tablets day four, 2 tablets day five, then 1 tablet day six 06/28/18   Prabhjot Maddux, Almyra Free, PA-C  rosuvastatin (CRESTOR) 20 MG tablet TAKE 1 TABLET BY MOUTH EVERY DAY 06/30/18   Bedsole, Amy E, MD  traMADol (ULTRAM) 50 MG tablet Take 1 tablet (50 mg total) by mouth every 4 (four) hours as needed for severe pain. 12/03/17   Bedsole, Amy E, MD  traZODone (DESYREL) 100 MG tablet TAKE 1 TABLET BY MOUTH EVERYDAY AT BEDTIME 06/30/18   Bedsole, Amy E, MD  zolpidem (AMBIEN CR) 6.25 MG CR tablet Take 6.25 mg by mouth at bedtime as needed for sleep.    [provider]    Family History Family History  Problem Relation Age of Onset  . Alzheimer's disease Mother   . Hyperlipidemia Mother   . Hypertension Mother   . Asthma Maternal Grandmother   . Diabetes Maternal Grandfather   . Alcohol abuse Maternal Grandfather   . Hyperlipidemia Maternal Grandfather   . Stroke Maternal Grandfather   . Hypertension Maternal Grandfather   . Diabetes Paternal Grandmother   . Alcohol abuse Maternal Uncle   . Cholecystitis Maternal Uncle   . Arthritis Maternal Uncle     Social History Social History   Tobacco Use  . Smoking status: Current Every Day Smoker    Packs/day: 0.25    Types: Cigarettes  . Smokeless tobacco: Never Used  Substance Use Topics  . Alcohol use: Yes    Alcohol/week: 0.0 standard drinks    Comment: occ glass of wine or mix drink  . Drug use: No     Allergies   Acetaminophen; Asa [aspirin]; Ibuprofen; and Other   Review of Systems Review of Systems  Constitutional: Negative for chills and fever.  HENT: Negative.   Respiratory: Negative for shortness of breath and wheezing.   Musculoskeletal: Negative for arthralgias and myalgias.  Skin: Positive for Trueba.  Neurological: Negative for numbness.     Physical Exam Updated Vital Signs BP (!) 151/52 (BP Location: Right Arm)   Pulse 74   Temp 98.4 F (36.9 C) (Oral)   Resp 16   Ht 5' 2.5"  (1.588 m)   Wt 63.5 kg   SpO2 98%   BMI 25.20 kg/m   Physical Exam  Constitutional: She appears well-developed and well-nourished. No distress.  HENT:  Head: Normocephalic.  Neck: Neck supple.  Cardiovascular: Normal rate.  Pulmonary/Chest: Effort normal. She has no wheezes.  Musculoskeletal: Normal range of motion. She exhibits no edema.  Skin: [place] noted. [place] is papular.  Scattered papular, erythematous Baer on trunk and arms, each lesion approx 61mm , some with central tiny vesicle. No no surrounding erythema, red streaking and no drainage.  Excoriations present. No linear distribution.   Nursing note  and vitals reviewed.    ED Treatments / Results  Labs (all labs ordered are listed, but only abnormal results are displayed) Labs Reviewed - No data to display  EKG None  Radiology No results found.  Procedures Procedures (including critical care time)  Medications Ordered in ED Medications  predniSONE (DELTASONE) tablet 60 mg (60 mg Oral Given 06/28/18 2140)  famotidine (PEPCID) tablet 20 mg (20 mg Oral Given 06/28/18 2140)  hydrOXYzine (VISTARIL) injection 25 mg (25 mg Intramuscular Given 06/28/18 2140)     Initial Impression / Assessment and Plan / ED Course  I have reviewed the triage vital signs and the nursing notes.  Pertinent labs & imaging results that were available during my care of the patient were reviewed by me and considered in my medical decision making (see chart for details).     Exam and hx with high probability of chigger bite. Pt given atarax in place of benadryl, pepcid also added given significant histamine reaction to the exposure. Prednisone taper. Plan f/u with pcp prn if sx persist or worsen.  Pt has no fevers, no coryza sx, doubt infectious exanthem.  No evidence for skin infection either.  Final Clinical Impressions(s) / ED Diagnoses   Final diagnoses:  S[place]ma  Chiggers    ED Discharge Orders         Ordered    famotidine (PEPCID) 20  MG tablet  2 times daily     06/28/18 2050    predniSONE (DELTASONE) 10 MG tablet     06/28/18 2050    hydrOXYzine (ATARAX/VISTARIL) 25 MG tablet  Every 6 hours PRN     06/28/18 2050           Evalee Jefferson, PA-C 06/30/18 1341    Francine Graven, DO 07/02/18 1551

## 2018-07-24 ENCOUNTER — Other Ambulatory Visit: Payer: Self-pay | Admitting: Family Medicine

## 2018-08-01 DIAGNOSIS — Z23 Encounter for immunization: Secondary | ICD-10-CM | POA: Diagnosis not present

## 2018-08-20 ENCOUNTER — Ambulatory Visit (INDEPENDENT_AMBULATORY_CARE_PROVIDER_SITE_OTHER): Payer: Medicare Other

## 2018-08-20 ENCOUNTER — Telehealth: Payer: Self-pay | Admitting: Family Medicine

## 2018-08-20 VITALS — BP 158/84 | HR 58 | Temp 98.2°F | Ht 62.25 in | Wt 143.8 lb

## 2018-08-20 DIAGNOSIS — Z0000 Encounter for general adult medical examination without abnormal findings: Secondary | ICD-10-CM | POA: Diagnosis not present

## 2018-08-20 DIAGNOSIS — E039 Hypothyroidism, unspecified: Secondary | ICD-10-CM | POA: Diagnosis not present

## 2018-08-20 DIAGNOSIS — E1121 Type 2 diabetes mellitus with diabetic nephropathy: Secondary | ICD-10-CM | POA: Diagnosis not present

## 2018-08-20 DIAGNOSIS — I10 Essential (primary) hypertension: Secondary | ICD-10-CM

## 2018-08-20 DIAGNOSIS — E7800 Pure hypercholesterolemia, unspecified: Secondary | ICD-10-CM

## 2018-08-20 LAB — COMPREHENSIVE METABOLIC PANEL
ALBUMIN: 4.6 g/dL (ref 3.5–5.2)
ALK PHOS: 45 U/L (ref 39–117)
ALT: 19 U/L (ref 0–35)
AST: 22 U/L (ref 0–37)
BUN: 13 mg/dL (ref 6–23)
CALCIUM: 9.3 mg/dL (ref 8.4–10.5)
CHLORIDE: 104 meq/L (ref 96–112)
CO2: 31 mEq/L (ref 19–32)
CREATININE: 1.22 mg/dL — AB (ref 0.40–1.20)
GFR: 46.38 mL/min — ABNORMAL LOW (ref >60.00)
Glucose, Bld: 117 mg/dL — ABNORMAL HIGH (ref 70–99)
Potassium: 4 mEq/L (ref 3.5–5.1)
Sodium: 140 mEq/L (ref 135–145)
Total Bilirubin: 0.5 mg/dL (ref 0.2–1.2)
Total Protein: 6.9 g/dL (ref 6.0–8.3)

## 2018-08-20 LAB — CBC WITH DIFFERENTIAL/PLATELET
BASOS ABS: 0.1 10*3/uL (ref 0.0–0.1)
Basophils Relative: 1.1 % (ref 0.0–3.0)
EOS ABS: 0.6 10*3/uL (ref 0.0–0.7)
Eosinophils Relative: 8 % — ABNORMAL HIGH (ref 0.0–5.0)
HEMATOCRIT: 38.1 % (ref 36.0–46.0)
HEMOGLOBIN: 13 g/dL (ref 12.0–15.0)
LYMPHS PCT: 32.2 % (ref 12.0–46.0)
Lymphs Abs: 2.5 10*3/uL (ref 0.7–4.0)
MCHC: 34.2 g/dL (ref 30.0–36.0)
MCV: 83.7 fl (ref 78.0–100.0)
MONOS PCT: 10 % (ref 3.0–12.0)
Monocytes Absolute: 0.8 10*3/uL (ref 0.1–1.0)
NEUTROS ABS: 3.8 10*3/uL (ref 1.4–7.7)
Neutrophils Relative %: 48.7 % (ref 43.0–77.0)
Platelets: 257 10*3/uL (ref 150.0–400.0)
RBC: 4.55 Mil/uL (ref 3.87–5.11)
RDW: 14.2 % (ref 11.5–15.5)
WBC: 7.9 10*3/uL (ref 4.0–10.5)

## 2018-08-20 LAB — T4, FREE: FREE T4: 1.09 ng/dL (ref 0.60–1.60)

## 2018-08-20 LAB — LDL CHOLESTEROL, DIRECT: Direct LDL: 116 mg/dL

## 2018-08-20 LAB — HEMOGLOBIN A1C: Hgb A1c MFr Bld: 6.9 % — ABNORMAL HIGH (ref 4.6–6.5)

## 2018-08-20 LAB — TSH: TSH: 0.43 u[IU]/mL (ref 0.35–4.50)

## 2018-08-20 LAB — T3, FREE: T3, Free: 3.1 pg/mL (ref 2.3–4.2)

## 2018-08-20 LAB — LIPID PANEL
Cholesterol: 174 mg/dL (ref 0–200)
HDL: 42.4 mg/dL (ref >39.00)
NONHDL: 131.46
TRIGLYCERIDES: 242 mg/dL — AB (ref 0.0–149.0)
Total CHOL/HDL Ratio: 4
VLDL: 48.4 mg/dL — ABNORMAL HIGH (ref 0.0–40.0)

## 2018-08-20 NOTE — Progress Notes (Signed)
PCP notes:   Health maintenance:  Foot exam - PCP follow-up needed A1C - completed  Abnormal screenings:   Depression score: 13 Depression screen [facility] 2/9 08/20/2018 05/09/2017 05/08/2016  Decreased Interest 2 1 0  Down, Depressed, Hopeless 3 1 0  PHQ - 2 Score 5 2 0  Altered sleeping 2 3 -  Tired, decreased energy 3 2 -  Change in appetite 1 2 -  Feeling bad or failure about yourself  1 0 -  Trouble concentrating 1 0 -  Moving slowly or fidgety/restless 0 0 -  Suicidal thoughts 0 0 -  PHQ-9 Score 13 9 -  Difficult doing work/chores Somewhat difficult Somewhat difficult -   Fall risk - hx of multiple falls Fall Risk  08/20/2018 05/09/2017 05/08/2016  Falls in the past year? 1 Yes Yes  Comment 2 falls due to tripping over small dog; denies injury - -  Number falls in past yr: 1 1 2  or more  Injury with Fall? 0 No No  Risk for fall due to : - Impaired vision -  Follow up - Education provided;Falls prevention discussed -   Patient concerns:   Patient verbalized concerns with family dynamics that are impacting her emotional and physical health. Patient was visibly tearful several times during wellness visit. Patient was strongly encouraged to identify ways to reduce stress.    Nurse concerns:  BP elevated @ 158/84. Patient is emotional and hungry.  Next PCP appt:   08/29/18 @ 1115  I reviewed health advisor's note, was available for consultation, and agree with documentation and plan. Loura Pardon MD

## 2018-08-20 NOTE — Patient Instructions (Addendum)
April Ross , Thank you for taking time to come for your Medicare Wellness Visit. I appreciate your ongoing commitment to your health goals. Please review the following plan we discussed and let me know if I can assist you in the future.   These are the goals we discussed: Goals    . Patient Stated     Starting 11/31/2019, I will monitor and reduce intake of simple carbohydrates in an effort to lose 25 lbs by November 2020.       This is a list of the screening recommended for you and due dates:  Health Maintenance  Topic Date Due  . Complete foot exam   08/29/2018*  . Eye exam for diabetics  01/07/2019  . Hemoglobin A1C  02/18/2019  . Mammogram  06/06/2019  . Colon Cancer Screening  03/23/2020  . Tetanus Vaccine  06/05/2026  . Flu Shot  Completed  . DEXA scan (bone density measurement)  Completed  .  Hepatitis C: One time screening is recommended by Center for Disease Control  (CDC) for  adults born from [birth year] through 1967.   Completed  . Pneumonia vaccines  Completed  *Topic was postponed. The date shown is not the original due date.   Preventive Care for Adults  A healthy lifestyle and preventive care can promote health and wellness. Preventive health guidelines for adults include the following key practices.  . A routine yearly physical is a good way to check with your health care provider about your health and preventive screening. It is a chance to share any concerns and updates on your health and to receive a thorough exam.  . Visit your dentist for a routine exam and preventive care every 6 months. Brush your teeth twice a day and floss once a day. Good oral hygiene prevents tooth decay and gum disease.  . The frequency of eye exams is based on your age, health, family medical history, use  of contact lenses, and other factors. Follow your health care provider's recommendations for frequency of eye exams.  . Eat a healthy diet. Foods like vegetables, fruits, whole grains,  low-fat dairy products, and lean protein foods contain the nutrients you need without too many calories. Decrease your intake of foods high in solid fats, added sugars, and salt. Eat the right amount of calories for you. Get information about a proper diet from your health care provider, if necessary.  . Regular physical exercise is one of the most important things you can do for your health. Most adults should get at least 150 minutes of moderate-intensity exercise (any activity that increases your heart rate and causes you to sweat) each week. In addition, most adults need muscle-strengthening exercises on 2 or more days a week.  Silver Sneakers may be a benefit available to you. To determine eligibility, you may visit the website: www.silversneakers.com or contact program at [phone] Mon-Fri between 8AM-8PM.   . Maintain a healthy weight. The body mass index (BMI) is a screening tool to identify possible weight problems. It provides an estimate of body fat based on height and weight. Your health care provider can find your BMI and can help you achieve or maintain a healthy weight.   For adults 20 years and older: ? A BMI below 18.5 is considered underweight. ? A BMI of 18.5 to 24.9 is normal. ? A BMI of 25 to 29.9 is considered overweight. ? A BMI of 30 and above is considered obese.   . Maintain normal blood  lipids and cholesterol levels by exercising and minimizing your intake of saturated fat. Eat a balanced diet with plenty of fruit and vegetables. Blood tests for lipids and cholesterol should begin at age 68 and be repeated every 5 years. If your lipid or cholesterol levels are high, you are over 50, or you are at high risk for heart disease, you may need your cholesterol levels checked more frequently. Ongoing high lipid and cholesterol levels should be treated with medicines if diet and exercise are not working.  . If you smoke, find out from your health care provider how to quit. If  you do not use tobacco, please do not start.  . If you choose to drink alcohol, please do not consume more than 2 drinks per day. One drink is considered to be 12 ounces (355 mL) of beer, 5 ounces (148 mL) of wine, or 1.5 ounces (44 mL) of liquor.  . If you are 21-82 years old, ask your health care provider if you should take aspirin to prevent strokes.  . Use sunscreen. Apply sunscreen liberally and repeatedly throughout the day. You should seek shade when your shadow is shorter than you. Protect yourself by wearing long sleeves, pants, a wide-brimmed hat, and sunglasses year round, whenever you are outdoors.  . Once a month, do a whole body skin exam, using a mirror to look at the skin on your back. Tell your health care provider of new moles, moles that have irregular borders, moles that are larger than a pencil eraser, or moles that have changed in shape or color.

## 2018-08-20 NOTE — Progress Notes (Signed)
Subjective:   April Ross is a 67 y.o. female who presents for Medicare Annual (Subsequent) preventive examination.  Review of Systems:  N/A Cardiac Risk Factors include: advanced age (>11men, >2 women);diabetes mellitus;dyslipidemia;hypertension     Objective:     Vitals: BP (!) 158/84 (BP Location: Right Arm, Patient Position: Sitting, Cuff Size: Normal)   Pulse (!) 58   Temp 98.2 F (36.8 C) (Oral)   Ht 5' 2.25" (1.581 m) Comment: no shoes  Wt 143 lb 12 oz (65.2 kg)   SpO2 99%   BMI 26.08 kg/m   Body mass index is 26.08 kg/m.  Advanced Directives 08/20/2018 06/28/2018 10/27/2017 10/27/2017 05/09/2017 05/08/2016  Does Patient Have a Medical Advance Directive? No No No No No No  Would patient like information on creating a medical advance directive? No - Patient declined No - Patient declined No - Patient declined - Yes (MAU/Ambulatory/Procedural Areas - Information given);Yes (Inpatient - patient requests chaplain consult to create a medical advance directive) Yes - Educational materials given    Tobacco Social History   Tobacco Use  Smoking Status Current Every Day Smoker  . Packs/day: 0.25  . Types: Cigarettes  Smokeless Tobacco Never Used     Ready to quit: No Counseling given: No   Clinical Intake:  Pre-visit preparation completed: Yes  Pain : No/denies pain Pain Score: 0-No pain     Nutritional Status: BMI 25 -29 Overweight Nutritional Risks: None Diabetes: No  How often do you need to have someone help you when you read instructions, pamphlets, or other written materials from your doctor or pharmacy?: 1 - Never What is the last grade level you completed in school?: Bachelor degree  Interpreter Needed?: No  Comments: pt is a widow and lives with daughter and granddaughters Information entered by :: LPinson, LPN  Past Medical History:  Diagnosis Date  . Allergy   . Arthritis   . CRD (chronic renal disease)   . Depression   . Diabetes mellitus  without complication ([place])   . GERD (gastroesophageal reflux disease)   . Hyperlipidemia   . Hypertension   . LBBB (left bundle branch block)    a. diagnosed in 10/2017 - echo showed a preserved EF of 55-60% and NST was low-risk showing no evidence of ischemia.   . polyps in colon   . Thyroid disease   . Urinary tract infection    Past Surgical History:  Procedure Laterality Date  . ABDOMINAL HYSTERECTOMY    . APPENDECTOMY     Family History  Problem Relation Age of Onset  . Alzheimer's disease Mother   . Hyperlipidemia Mother   . Hypertension Mother   . Asthma Maternal Grandmother   . Diabetes Maternal Grandfather   . Alcohol abuse Maternal Grandfather   . Hyperlipidemia Maternal Grandfather   . Stroke Maternal Grandfather   . Hypertension Maternal Grandfather   . Diabetes Paternal Grandmother   . Alcohol abuse Maternal Uncle   . Cholecystitis Maternal Uncle   . Arthritis Maternal Uncle    Social History   Socioeconomic History  . Marital status: Widowed    Spouse name: Not on file  . Number of children: Not on file  . Years of education: Not on file  . Highest education level: Not on file  Occupational History  . Not on file  Social Needs  . Financial resource strain: Not on file  . Food insecurity:    Worry: Not on file    Inability: Not  on file  . Transportation needs:    Medical: Not on file    Non-medical: Not on file  Tobacco Use  . Smoking status: Current Every Day Smoker    Packs/day: 0.25    Types: Cigarettes  . Smokeless tobacco: Never Used  Substance and Sexual Activity  . Alcohol use: Yes    Alcohol/week: 0.0 standard drinks    Comment: occ glass of wine or mix drink  . Drug use: No  . Sexual activity: Not Currently  Lifestyle  . Physical activity:    Days per week: Not on file    Minutes per session: Not on file  . Stress: Not on file  Relationships  . Social connections:    Talks on phone: Not on file    Gets together: Not on file     Attends religious service: Not on file    Active member of club or organization: Not on file    Attends meetings of clubs or organizations: Not on file    Relationship status: Not on file  Other Topics Concern  . Not on file  Social History Narrative  . Not on file    Outpatient Encounter Medications as of 08/20/2018  Medication Sig  . alendronate (FOSAMAX) 70 MG tablet TAKE 1 TABLET EVERY 7 DAYS TAKE WITH A FULL GLASS OF WATER ON AN EMPTY STOMACH  . aspirin EC 81 MG tablet Take 81 mg by mouth daily.  . Calcium Carb-Cholecalciferol (CALCIUM-VITAMIN D) 500-200 MG-UNIT tablet Take 1 tablet by mouth daily.  . cetirizine (ZYRTEC) 10 MG tablet Take 10 mg by mouth at bedtime.  Marland Kitchen FLUoxetine (PROZAC) 20 MG capsule TAKE 1 CAPSULE BY MOUTH EVERY DAY  . fluticasone (FLONASE) 50 MCG/ACT nasal spray PLACE 2 SPRAYS INTO BOTH NOSTRILS DAILY.  Marland Kitchen levothyroxine (SYNTHROID, LEVOTHROID) 88 MCG tablet Take 88 mcg by mouth daily.  Marland Kitchen lisinopril (PRINIVIL,ZESTRIL) 20 MG tablet Take 1 tablet (20 mg total) by mouth daily.  . metFORMIN (GLUCOPHAGE) 500 MG tablet Take 1 tablet (500 mg total) by mouth 2 (two) times daily with a meal.  . omeprazole (PRILOSEC) 20 MG capsule TAKE 1 CAPSULE BY MOUTH EVERY DAY  . rosuvastatin (CRESTOR) 20 MG tablet TAKE 1 TABLET BY MOUTH EVERY DAY  . traMADol (ULTRAM) 50 MG tablet Take 1 tablet (50 mg total) by mouth every 4 (four) hours as needed for severe pain.  . traZODone (DESYREL) 100 MG tablet TAKE 1 TABLET BY MOUTH EVERYDAY AT BEDTIME  . zolpidem (AMBIEN CR) 6.25 MG CR tablet Take 6.25 mg by mouth at bedtime as needed for sleep.  . [DISCONTINUED] cyclobenzaprine (FLEXERIL) 10 MG tablet Take 1 tablet (10 mg total) by mouth 3 (three) times daily as needed for muscle spasms.  . [DISCONTINUED] famotidine (PEPCID) 20 MG tablet Take 1 tablet (20 mg total) by mouth 2 (two) times daily.  . [DISCONTINUED] hydrOXYzine (ATARAX/VISTARIL) 25 MG tablet Take 1 tablet (25 mg total) by mouth every  6 (six) hours as needed for itching.  . [DISCONTINUED] predniSONE (DELTASONE) 10 MG tablet Take 6 tablets day one, 5 tablets day two, 4 tablets day three, 3 tablets day four, 2 tablets day five, then 1 tablet day six   No facility-administered encounter medications on file as of 08/20/2018.     Activities of Daily Living In your present state of health, do you have any difficulty performing the following activities: 08/20/2018 10/27/2017  Hearing? N N  Vision? N N  Difficulty concentrating or making decisions? N  N  Walking or climbing stairs? N N  Dressing or bathing? N N  Doing errands, shopping? N N  Preparing Food and eating ? N -  Using the Toilet? N -  In the past six months, have you accidently leaked urine? N -  Do you have problems with loss of bowel control? Y -  Managing your Medications? N -  Managing your Finances? N -  Housekeeping or managing your Housekeeping? N -  Some recent data might be hidden    Patient Care Team: Jinny Sanders, MD as PCP - General (Family Medicine) Richard Miu, DMD as Consulting Physician (Dentistry)    Assessment:   This is a routine wellness examination for Katharin.   Hearing Screening   125Hz  250Hz  500Hz  1000Hz  2000Hz  3000Hz  4000Hz  6000Hz  8000Hz   Right ear:   40 40 40  40    Left ear:   40 40 40  40    Vision Screening Comments: Vision exam in 2019 @ [facility] ' Exercise Activities and Dietary recommendations Exercise limited by: None identified  Goals    . Patient Stated     Starting 11/31/2019, I will monitor and reduce intake of simple carbohydrates in an effort to lose 25 lbs by November 2020.       Fall Risk Fall Risk  08/20/2018 05/09/2017 05/08/2016  Falls in the past year? 1 Yes Yes  Comment 2 falls due to tripping over small dog; denies injury - -  Number falls in past yr: 1 1 2  or more  Injury with Fall? 0 No No  Risk for fall due to : - Impaired vision -  Follow up - Education provided;Falls prevention discussed -    Depression Screen PHQ 2/9 Scores 08/20/2018 05/09/2017 05/08/2016  PHQ - 2 Score 5 2 0  PHQ- 9 Score 13 9 -     Cognitive Function MMSE - Mini Mental State Exam 08/20/2018 05/09/2017  Orientation to time 5 5  Orientation to Place 5 5  Registration 3 3  Attention/ Calculation 0 0  Recall 3 3  Language- name 2 objects 0 0  Language- repeat 1 1  Language- follow 3 step command 3 3  Language- read & follow direction 0 0  Write a sentence 0 0  Copy design 0 0  Total score 20 20     PLEASE NOTE: A Mini-Cog screen was completed. Maximum score is 20. A value of 0 denotes this part of Folstein MMSE was not completed or the patient failed this part of the Mini-Cog screening.   Mini-Cog Screening Orientation to Time - Max 5 pts Orientation to Place - Max 5 pts Registration - Max 3 pts Recall - Max 3 pts Language Repeat - Max 1 pts Language Follow 3 Step Command - Max 3 pts     Immunization History  Administered Date(s) Administered  . Influenza, High Dose Seasonal PF 07/16/2017, 08/01/2018  . Influenza,inj,Quad PF,6+ Mos 06/05/2016  . PPD Test 12/26/2016  . Pneumococcal Conjugate-13 02/25/2014  . Pneumococcal Polysaccharide-23 08/04/2009, 05/08/2016  . Tdap 06/05/2016  . Zoster 08/13/2012    Screening Tests Health Maintenance  Topic Date Due  . FOOT EXAM  08/29/2018 (Originally 05/16/2018)  . OPHTHALMOLOGY EXAM  01/07/2019  . HEMOGLOBIN A1C  02/18/2019  . MAMMOGRAM  06/06/2019  . COLONOSCOPY  03/23/2020  . TETANUS/TDAP  06/05/2026  . INFLUENZA VACCINE  Completed  . DEXA SCAN  Completed  . Hepatitis C Screening  Completed  . PNA vac Low  Risk Adult  Completed       Plan:     I have personally reviewed, addressed, and noted the following in the patient's chart:  A. Medical and social history B. Use of alcohol, tobacco or illicit drugs  C. Current medications and supplements D. Functional ability and status E.  Nutritional status F.  Physical activity G. Advance  directives H. List of other physicians I.  Hospitalizations, surgeries, and ER visits in previous 12 months J.  Oval to include hearing, vision, cognitive, depression L. Referrals and appointments - none  In addition, I have reviewed and discussed with patient certain preventive protocols, quality metrics, and best practice recommendations. A written personalized care plan for preventive services as well as general preventive health recommendations were provided to patient.  See attached scanned questionnaire for additional information.   Signed,   Lindell Noe, MHA, BS, LPN Health Coach

## 2018-08-20 NOTE — Telephone Encounter (Signed)
-----   Message from Eustace Pen, LPN sent at [id number]/35/6701  4:21 PM EST ----- Regarding: labs 11/13 Lab orders needed. Thank you.  Insurance:  [employer]

## 2018-08-29 ENCOUNTER — Ambulatory Visit (INDEPENDENT_AMBULATORY_CARE_PROVIDER_SITE_OTHER): Payer: Medicare Other | Admitting: Family Medicine

## 2018-08-29 ENCOUNTER — Encounter: Payer: Self-pay | Admitting: Family Medicine

## 2018-08-29 VITALS — BP 160/60 | HR 77 | Temp 98.8°F | Ht 62.25 in | Wt 144.0 lb

## 2018-08-29 DIAGNOSIS — E039 Hypothyroidism, unspecified: Secondary | ICD-10-CM

## 2018-08-29 DIAGNOSIS — M810 Age-related osteoporosis without current pathological fracture: Secondary | ICD-10-CM

## 2018-08-29 DIAGNOSIS — N183 Chronic kidney disease, stage 3 unspecified: Secondary | ICD-10-CM

## 2018-08-29 DIAGNOSIS — E7800 Pure hypercholesterolemia, unspecified: Secondary | ICD-10-CM | POA: Diagnosis not present

## 2018-08-29 DIAGNOSIS — I10 Essential (primary) hypertension: Secondary | ICD-10-CM

## 2018-08-29 DIAGNOSIS — F321 Major depressive disorder, single episode, moderate: Secondary | ICD-10-CM | POA: Diagnosis not present

## 2018-08-29 DIAGNOSIS — Z1231 Encounter for screening mammogram for malignant neoplasm of breast: Secondary | ICD-10-CM | POA: Diagnosis not present

## 2018-08-29 DIAGNOSIS — E1121 Type 2 diabetes mellitus with diabetic nephropathy: Secondary | ICD-10-CM | POA: Diagnosis not present

## 2018-08-29 LAB — HM DIABETES FOOT EXAM

## 2018-08-29 MED ORDER — LOSARTAN POTASSIUM 100 MG PO TABS: 100.0000 mg | ORAL_TABLET | Freq: Every day | ORAL | 11 refills | Status: DC

## 2018-08-29 MED ORDER — FLUOXETINE HCL 40 MG PO CAPS: ORAL_CAPSULE | ORAL | 3 refills | Status: DC

## 2018-08-29 NOTE — Patient Instructions (Addendum)
Increase fluoxetine to 40 mg daily.  Continue trazodone at night for sleep.  Stop lisinopril and cahnge to losartan 100 mg daily . Follow BP at home.. Goal < 140/90. Please stop at the front desk to set up referral.

## 2018-08-29 NOTE — Progress Notes (Signed)
Subjective:    Patient ID: April Ross, female    DOB: 1950/12/28, 67 y.o.   MRN: [id number]  HPI   The patient presents for  complete physical and review of chronic health problems. He/She also has the following acute concerns today:none  The patient saw Candis Musa, LPN for medicare wellness. Note reviewed in detail and important notes copied below.  Health maintenance:  Foot exam - PCP follow-up needed A1C - completed  Abnormal screenings:   Depression score: 13 Depression screen [facility] 2/9 08/20/2018 05/09/2017 05/08/2016  Decreased Interest 2 1 0  Down, Depressed, Hopeless 3 1 0  PHQ - 2 Score 5 2 0  Altered sleeping 2 3 -  Tired, decreased energy 3 2 -  Change in appetite 1 2 -  Feeling bad or failure about yourself  1 0 -  Trouble concentrating 1 0 -  Moving slowly or fidgety/restless 0 0 -  Suicidal thoughts 0 0 -  PHQ-9 Score 13 9 -  Difficult doing work/chores Somewhat difficult Somewhat difficult -   Fall risk - hx of multiple falls Fall Risk  08/20/2018 05/09/2017 05/08/2016  Falls in the past year? 1 Yes Yes  Comment 2 falls due to tripping over small dog; denies injury - -  Number falls in past yr: 1 1 2  or more  Injury with Fall? 0 No No  Risk for fall due to : - Impaired vision -  Follow up - Education provided;Falls prevention discussed -   Patient concerns:   Patient verbalized concerns with family dynamics that are impacting her emotional and physical health. Patient was visibly tearful several times during wellness visit. Patient was strongly encouraged to identify ways to reduce stress.    Nurse concerns:  BP elevated @ 158/84. Patient is emotional and hungry.    08/29/18 Today  Diabetes:  Good control on metformin Lab Results  Component Value Date   HGBA1C 6.9 (H) 08/20/2018  Using medications without difficulties: Hypoglycemic episodes: Hyperglycemic episodes: Feet problems: no ucler Blood Sugars averaging:  occ FBS  120-140 eye exam within last year: yes  Hypertension:    Poor control on lisinopril 20 mg daily.  BP Readings from Last 3 Encounters:  08/29/18 (!) 160/60  08/20/18 (!) 158/84  06/28/18 (!) 151/52  Using medication without problems or lightheadedness: none Chest pain with exertion:none Edema:none Short of breath:none Average home BPs: Other issues: CKD.Marland Kitchen improved  Hypothyroid  Lab Results  Component Value Date   TSH 0.43 08/20/2018    MDD:  Poor control on prozac 20 mg daily  Elevated Cholesterol:  LDL at goal on crestor Lab Results  Component Value Date   CHOL 174 08/20/2018   HDL 42.40 08/20/2018   LDLCALC 61 10/28/2017   LDLDIRECT 116.0 08/20/2018   TRIG 242.0 (H) 08/20/2018   CHOLHDL 4 08/20/2018  Using medications without problems: Muscle aches:  Diet compliance: Exercise: Other complaints:   Social History /Family History/Past Medical History reviewed in detail and updated in EMR if needed. Blood pressure (!) 160/60, pulse 77, temperature 98.8 F (37.1 C), temperature source Oral, height 5' 2.25" (1.581 m), weight 144 lb (65.3 kg).  Review of Systems  Constitutional: Negative for fatigue, fever and unexpected weight change.  HENT: Negative for congestion, ear pain, sinus pressure, sneezing, sore throat and trouble swallowing.   Eyes: Negative for pain and itching.  Respiratory: Negative for cough, shortness of breath and wheezing.   Cardiovascular: Negative for chest pain, palpitations and leg swelling.  Gastrointestinal: Negative for abdominal pain, blood in stool, constipation, diarrhea and nausea.  Genitourinary: Negative for difficulty urinating, dysuria, hematuria, menstrual problem and vaginal discharge.  Skin: Negative for Schlag.  Neurological: Negative for syncope, weakness, light-headedness, numbness and headaches.  Psychiatric/Behavioral: Negative for confusion and dysphoric mood. The patient is not nervous/anxious.        Objective:    Physical Exam Constitutional:      General: She is not in acute distress.    Appearance: Normal appearance. She is well-developed. She is not ill-appearing or toxic-appearing.  HENT:     Head: Normocephalic.     Right Ear: Hearing, tympanic membrane, ear canal and external ear normal.     Left Ear: Hearing, tympanic membrane, ear canal and external ear normal.     Nose: Nose normal.  Eyes:     General: Lids are normal. Lids are everted, no foreign bodies appreciated.     Conjunctiva/sclera: Conjunctivae normal.     Pupils: Pupils are equal, round, and reactive to light.  Neck:     Musculoskeletal: Normal range of motion and neck supple.     Thyroid: No thyroid mass or thyromegaly.     Vascular: No carotid bruit.     Trachea: Trachea normal.  Cardiovascular:     Rate and Rhythm: Normal rate and regular rhythm.     Heart sounds: Normal heart sounds, S1 normal and S2 normal. No murmur. No gallop.   Pulmonary:     Effort: Pulmonary effort is normal. No respiratory distress.     Breath sounds: Normal breath sounds. No wheezing, rhonchi or rales.  Abdominal:     General: Bowel sounds are normal. There is no distension or abdominal bruit.     Palpations: Abdomen is soft. There is no fluid wave or mass.     Tenderness: There is no abdominal tenderness. There is no guarding or rebound.     Hernia: No hernia is present.  Lymphadenopathy:     Cervical: No cervical adenopathy.  Skin:    General: Skin is warm and dry.     Findings: No Havard.  Neurological:     Mental Status: She is alert.     Cranial Nerves: No cranial nerve deficit.     Sensory: No sensory deficit.  Psychiatric:        Mood and Affect: Mood is not anxious or depressed.        Speech: Speech normal.        Behavior: Behavior normal. Behavior is cooperative.        Judgment: Judgment normal.      Diabetic foot exam: Normal inspection No skin breakdown No calluses  Normal DP pulses Normal sensation to light touch  and monofilament Nails normal       Assessment & Plan:  The patient's preventative maintenance and recommended screening tests for an annual wellness exam were reviewed in full today. Brought up to date unless services declined.  Counselled on the importance of diet, exercise, and its role in overall health and mortality. The patient's FH and SH was reviewed, including their home life, tobacco status, and drug and alcohol status.    Hep C done  colonoscopy: 2016: repeat due toin 2021 S/P hysterectomy  Mammogram every other year.   05/2016 DEXA.. Will repeat 2019 on fosamax

## 2018-09-20 ENCOUNTER — Other Ambulatory Visit: Payer: Self-pay | Admitting: Family Medicine

## 2018-09-22 ENCOUNTER — Other Ambulatory Visit: Payer: Self-pay | Admitting: Family Medicine

## 2018-09-24 ENCOUNTER — Other Ambulatory Visit: Payer: Self-pay | Admitting: Family Medicine

## 2018-09-26 ENCOUNTER — Ambulatory Visit (INDEPENDENT_AMBULATORY_CARE_PROVIDER_SITE_OTHER): Payer: Medicare Other | Admitting: Family Medicine

## 2018-09-26 ENCOUNTER — Encounter: Payer: Self-pay | Admitting: Family Medicine

## 2018-09-26 DIAGNOSIS — M549 Dorsalgia, unspecified: Secondary | ICD-10-CM | POA: Insufficient documentation

## 2018-09-26 DIAGNOSIS — F321 Major depressive disorder, single episode, moderate: Secondary | ICD-10-CM

## 2018-09-26 DIAGNOSIS — M545 Low back pain, unspecified: Secondary | ICD-10-CM

## 2018-09-26 DIAGNOSIS — G8929 Other chronic pain: Secondary | ICD-10-CM

## 2018-09-26 DIAGNOSIS — I10 Essential (primary) hypertension: Secondary | ICD-10-CM

## 2018-09-26 MED ORDER — FLUOXETINE HCL 40 MG PO CAPS: 40.0000 mg | ORAL_CAPSULE | Freq: Every day | ORAL | 1 refills | Status: DC

## 2018-09-26 NOTE — Progress Notes (Signed)
Subjective:    Patient ID: April Ross, female    DOB: 1950/12/08, 67 y.o.   MRN: [id number]  HPI 67 year old female presents for follow up mood and HTN  Hypertension:  Improved control on losartan  100 mg daily ( changed from liinsopril) BP Readings from Last 3 Encounters:  09/26/18 124/68  08/29/18 (!) 160/60  08/20/18 (!) 158/84  Using medication without problems or lightheadedness: none Chest pain with exertion:none Edema:none Short of breath: none Average home BPs: Other issues: Occ ache across shpoulder blade and lower back off and on for 4-5  Month. No numbness, no weakness. IMproves with rest. Occ tramadol use.. helps some.  MDD: Increased fluoxetine to 40 mg daily  Trazodone at night for sleep.  She reports mood is improved some but she feels like she has no feelings ( this has been ever since she has been on fluoxetine)  Depression screen [facility] 2/9 09/26/2018 08/20/2018 05/09/2017  Decreased Interest 1 2 1   Down, Depressed, Hopeless 1 3 1   PHQ - 2 Score 2 5 2   Altered sleeping 1 2 3   Tired, decreased energy 3 3 2   Change in appetite 0 1 2  Feeling bad or failure about yourself  2 1 0  Trouble concentrating 0 1 0  Moving slowly or fidgety/restless 0 0 0  Suicidal thoughts 0 0 0  PHQ-9 Score 8 13 9   Difficult doing work/chores Somewhat difficult Somewhat difficult Somewhat difficult   Social History /Family History/Past Medical History reviewed in detail and updated in EMR if needed. Blood pressure 124/68, pulse 80, temperature 99.1 F (37.3 C), temperature source Oral, height 5' 2.25" (1.581 m), weight 143 lb (64.9 kg), SpO2 97 %.  Review of Systems  Constitutional: Negative for fatigue and fever.  HENT: Negative for congestion.   Eyes: Negative for pain.  Respiratory: Negative for cough and shortness of breath.   Cardiovascular: Negative for chest pain, palpitations and leg swelling.  Gastrointestinal: Negative for abdominal pain.  Genitourinary: Negative  for dysuria and vaginal bleeding.  Musculoskeletal: Positive for back pain.  Neurological: Negative for syncope, light-headedness and headaches.  Psychiatric/Behavioral: Negative for dysphoric mood.       Objective:   Physical Exam Constitutional:      General: She is not in acute distress.    Appearance: Normal appearance. She is well-developed. She is not ill-appearing or toxic-appearing.  HENT:     Head: Normocephalic.     Right Ear: Hearing, tympanic membrane, ear canal and external ear normal. Tympanic membrane is not erythematous, retracted or bulging.     Left Ear: Hearing, tympanic membrane, ear canal and external ear normal. Tympanic membrane is not erythematous, retracted or bulging.     Nose: No mucosal edema or rhinorrhea.     Right Sinus: No maxillary sinus tenderness or frontal sinus tenderness.     Left Sinus: No maxillary sinus tenderness or frontal sinus tenderness.     Mouth/Throat:     Pharynx: Uvula midline.  Eyes:     General: Lids are normal. Lids are everted, no foreign bodies appreciated.     Conjunctiva/sclera: Conjunctivae normal.     Pupils: Pupils are equal, round, and reactive to light.  Neck:     Musculoskeletal: Normal range of motion and neck supple.     Thyroid: No thyroid mass or thyromegaly.     Vascular: No carotid bruit.     Trachea: Trachea normal.  Cardiovascular:     Rate and Rhythm:  Normal rate and regular rhythm.     Pulses: Normal pulses.     Heart sounds: Normal heart sounds, S1 normal and S2 normal. No murmur. No friction rub. No gallop.   Pulmonary:     Effort: Pulmonary effort is normal. No tachypnea or respiratory distress.     Breath sounds: Normal breath sounds. No decreased breath sounds, wheezing, rhonchi or rales.  Abdominal:     General: Bowel sounds are normal.     Palpations: Abdomen is soft.     Tenderness: There is no abdominal tenderness.  Musculoskeletal:     Cervical back: She exhibits tenderness. She exhibits  normal range of motion and no bony tenderness.     Thoracic back: She exhibits normal range of motion, no tenderness and no bony tenderness.     Lumbar back: She exhibits tenderness. She exhibits normal range of motion and no bony tenderness.     Comments: ttp in upper and lower back over paraspinous and trapezius muscles bialterally  Skin:    General: Skin is warm and dry.     Findings: No Shuman.  Neurological:     Mental Status: She is alert.  Psychiatric:        Mood and Affect: Mood is not anxious or depressed.        Speech: Speech normal.        Behavior: Behavior normal. Behavior is cooperative.        Thought Content: Thought content normal.        Judgment: Judgment normal.           Assessment & Plan:

## 2018-09-26 NOTE — Patient Instructions (Addendum)
Continue fluoxetine at current dose. Continue losartan at current dose.   Start home stretching of upper and lower back.

## 2018-09-26 NOTE — Assessment & Plan Note (Signed)
Well controlled. Continue current medication.  

## 2018-09-26 NOTE — Assessment & Plan Note (Signed)
IMproved control on higher dose of fluoxetine, she an tolerate the " no emotion feeling" Denies SE and not interested in counseling or med change.

## 2018-09-26 NOTE — Assessment & Plan Note (Signed)
Start home stretches.. tylenol or ibuprofen for pain. Can use tramadol for breakthough.

## 2018-09-30 NOTE — Assessment & Plan Note (Signed)
LDL at goal on crestor. 

## 2018-09-30 NOTE — Assessment & Plan Note (Signed)
Well controlled. Continue current medication.  

## 2018-09-30 NOTE — Assessment & Plan Note (Signed)
Stable control on current dose. 

## 2018-09-30 NOTE — Assessment & Plan Note (Signed)
Stable control. 

## 2018-09-30 NOTE — Assessment & Plan Note (Signed)
Good control on metformin. 

## 2018-09-30 NOTE — Assessment & Plan Note (Signed)
Poor control on prozac 20 mg daily increase to 40 mg and follow up in 1 month. Offered counseling.. pt refused.

## 2018-10-15 ENCOUNTER — Other Ambulatory Visit: Payer: Self-pay | Admitting: Family Medicine

## 2018-10-29 ENCOUNTER — Ambulatory Visit
Admission: RE | Admit: 2018-10-29 | Discharge: 2018-10-29 | Disposition: A | Discharge status: Home / Self Care | Payer: PPO | Source: Ambulatory Visit | Attending: Family Medicine | Admitting: Family Medicine

## 2018-10-29 DIAGNOSIS — M810 Age-related osteoporosis without current pathological fracture: Secondary | ICD-10-CM

## 2018-10-29 DIAGNOSIS — Z1231 Encounter for screening mammogram for malignant neoplasm of breast: Secondary | ICD-10-CM | POA: Diagnosis not present

## 2018-10-29 DIAGNOSIS — Z780 Asymptomatic menopausal state: Secondary | ICD-10-CM | POA: Diagnosis not present

## 2018-10-29 IMAGING — MG DIGITAL SCREENING BILATERAL MAMMOGRAM WITH TOMO AND CAD
8 series · 9 of 24 positions shown · non-contrast
Comparison: Previous exam(s).

CLINICAL DATA: Screening.

EXAM:
DIGITAL SCREENING BILATERAL MAMMOGRAM WITH TOMO AND CAD

[R MLO synth-2D]
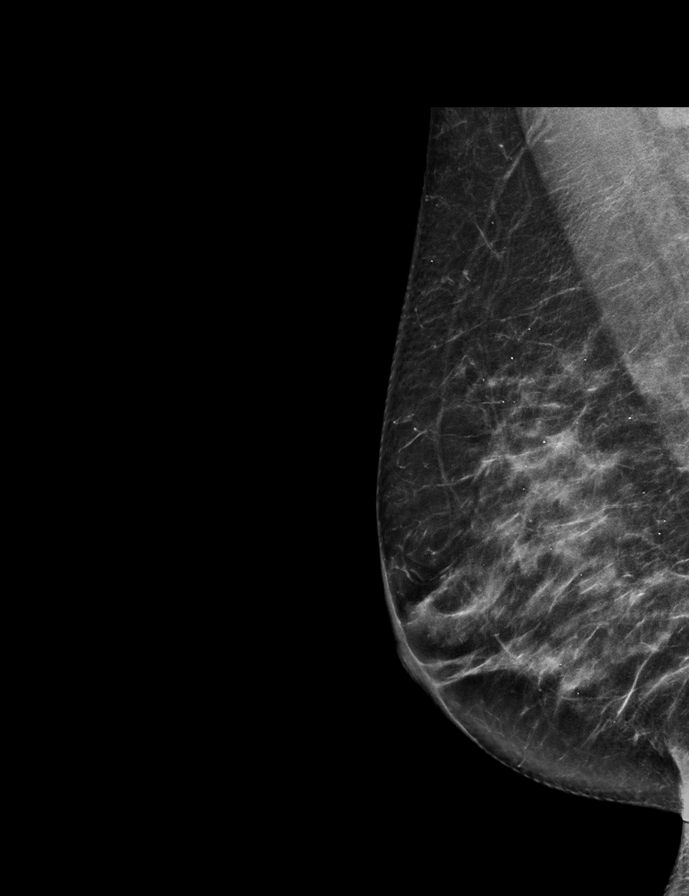

[L CC synth-2D]
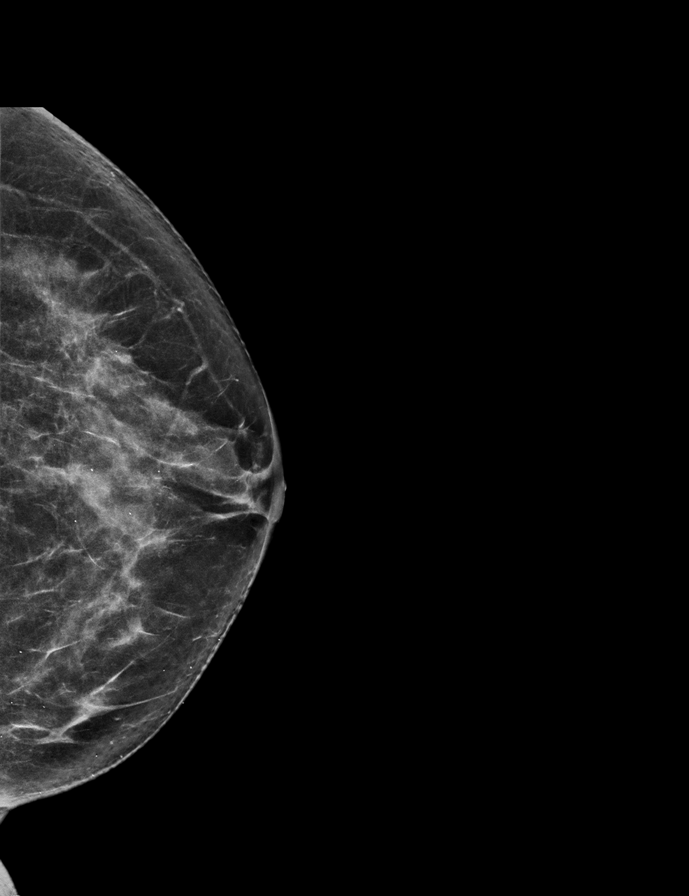

[R CC synth-2D]
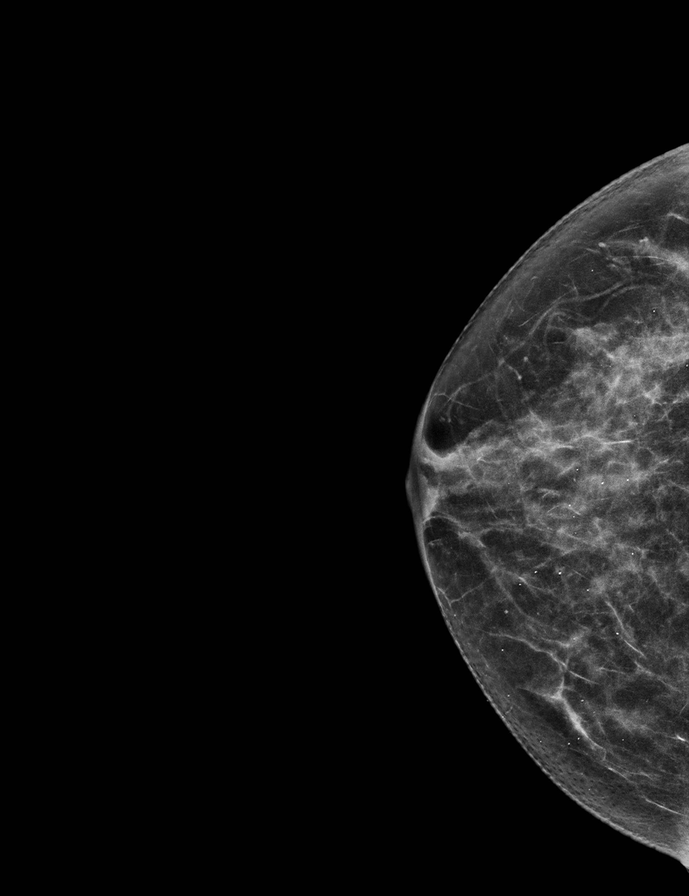

[L MLO synth-2D]
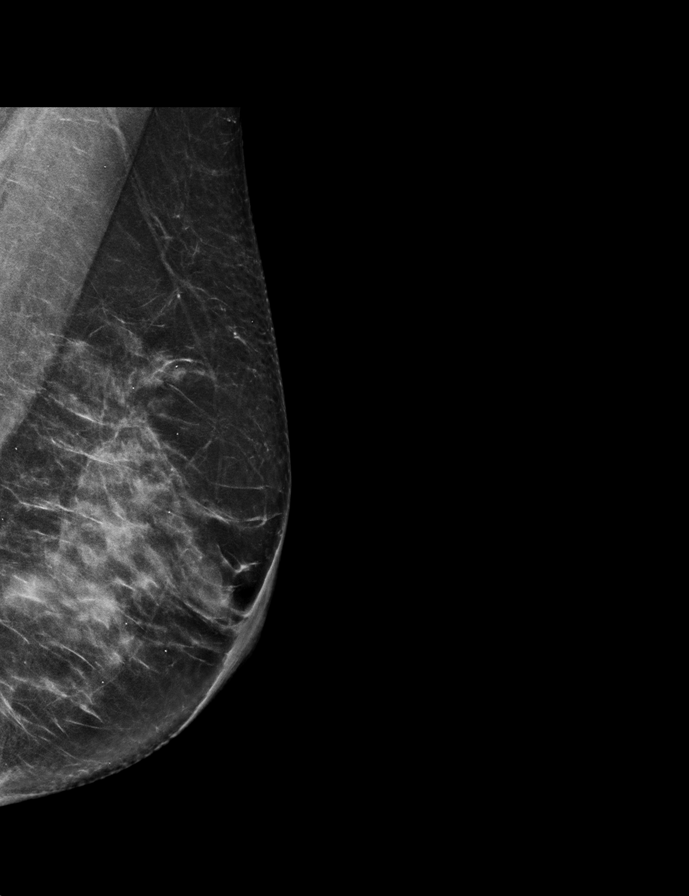

[L MLO tomo · 2 of 68 frames shown]
[frame 22/68]
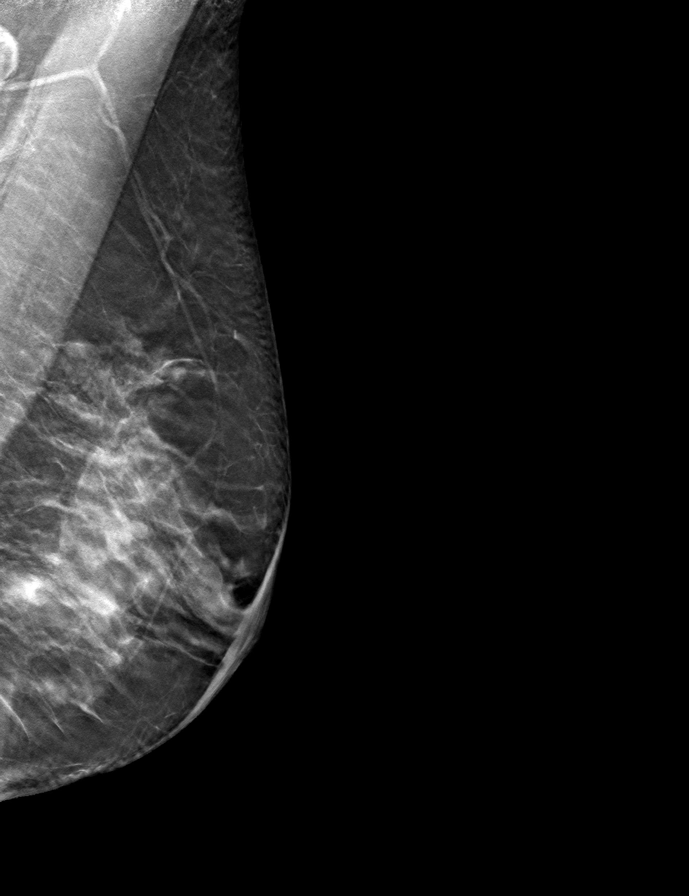
[frame 35/68]
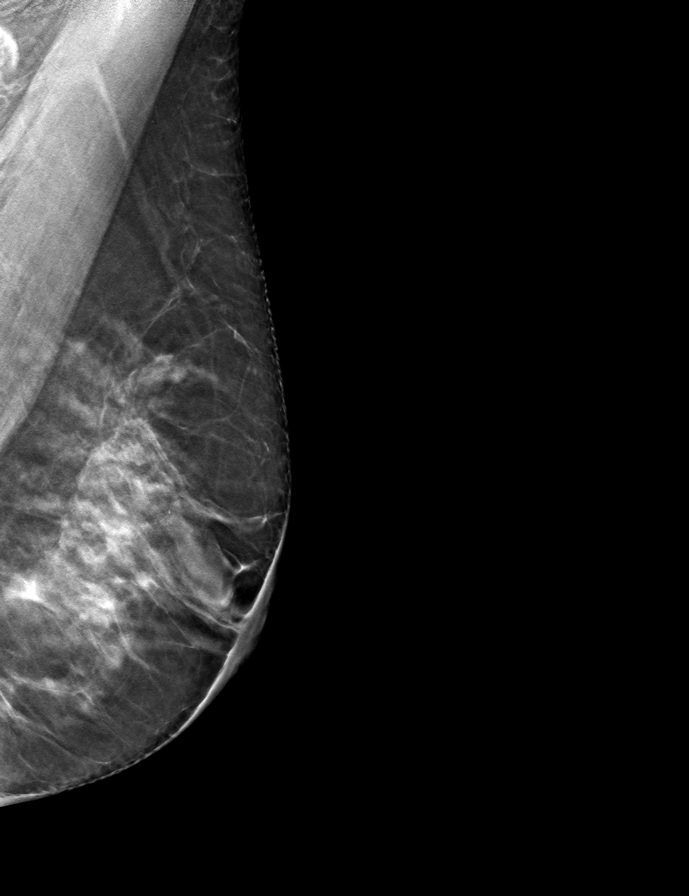

[L CC tomo · tomo slice 32/63.0]
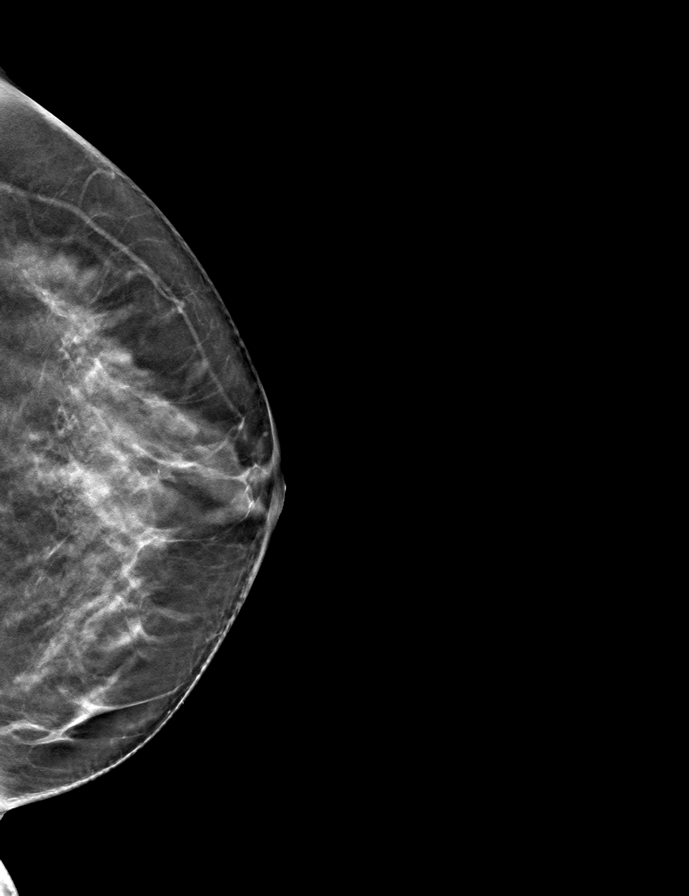

[R CC tomo · tomo slice 30/59.0]
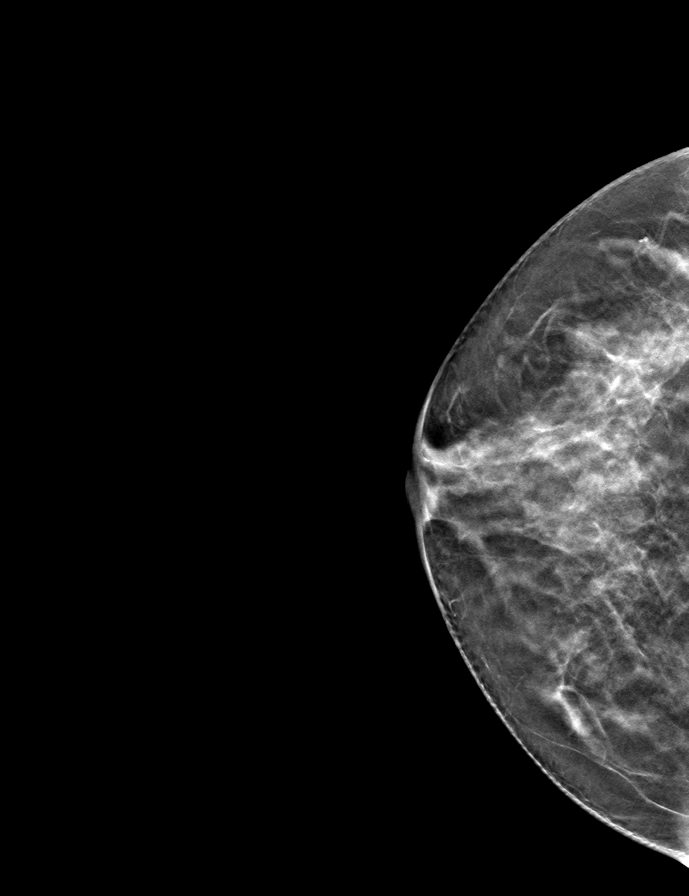

[R MLO tomo · tomo slice 35/69.0]
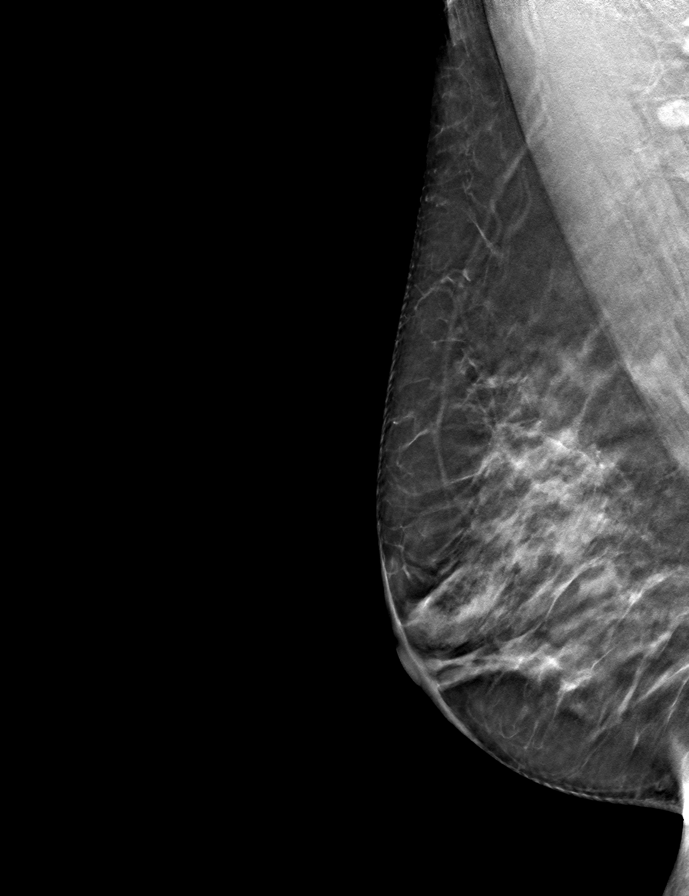

[9 of 24 positions shown; findings below may reference images not displayed]

ACR Breast Density Category c: The breast tissue is heterogeneously
dense, which may obscure small masses.
FINDINGS: There are no findings suspicious for malignancy. Images were
processed with CAD.
IMPRESSION: No mammographic evidence of malignancy. A result letter of this
screening mammogram will be mailed directly to the patient.

RECOMMENDATION:
Screening mammogram in one year. (Code:[5V])

BI-RADS CATEGORY  1: Negative.

## 2018-11-18 MED ORDER — TIZANIDINE HCL 4 MG PO TABS: 4.0000 mg | ORAL_TABLET | Freq: Every evening | ORAL | 2 refills | Status: DC | PRN

## 2018-11-18 MED ORDER — TRAMADOL HCL 50 MG PO TABS: 50.0000 mg | ORAL_TABLET | Freq: Four times a day (QID) | ORAL | 0 refills | Status: AC | PRN

## 2018-11-21 ENCOUNTER — Ambulatory Visit (INDEPENDENT_AMBULATORY_CARE_PROVIDER_SITE_OTHER): Payer: PPO | Admitting: Primary Care

## 2018-11-21 ENCOUNTER — Ambulatory Visit (INDEPENDENT_AMBULATORY_CARE_PROVIDER_SITE_OTHER)
Admission: RE | Admit: 2018-11-21 | Discharge: 2018-11-21 | Disposition: A | Discharge status: Home / Self Care | Payer: PPO | Source: Ambulatory Visit | Attending: Primary Care | Admitting: Primary Care

## 2018-11-21 ENCOUNTER — Encounter: Payer: Self-pay | Admitting: Primary Care

## 2018-11-21 VITALS — BP 126/82 | HR 87 | Temp 98.4°F | Ht 62.25 in | Wt 140.5 lb

## 2018-11-21 DIAGNOSIS — M546 Pain in thoracic spine: Secondary | ICD-10-CM | POA: Diagnosis not present

## 2018-11-21 DIAGNOSIS — G8929 Other chronic pain: Secondary | ICD-10-CM

## 2018-11-21 DIAGNOSIS — R509 Fever, unspecified: Secondary | ICD-10-CM

## 2018-11-21 IMAGING — DX DG THORACIC SPINE 3V
3 series · 3 of 3 positions shown · non-contrast
Comparison: Chest radiographs [DATE].

CLINICAL DATA: 67-year-old female with chronic thoracic pain, not
improving with conservative treatment.

EXAM:
THORACIC SPINE - 3 VIEWS

[t-spine ap]
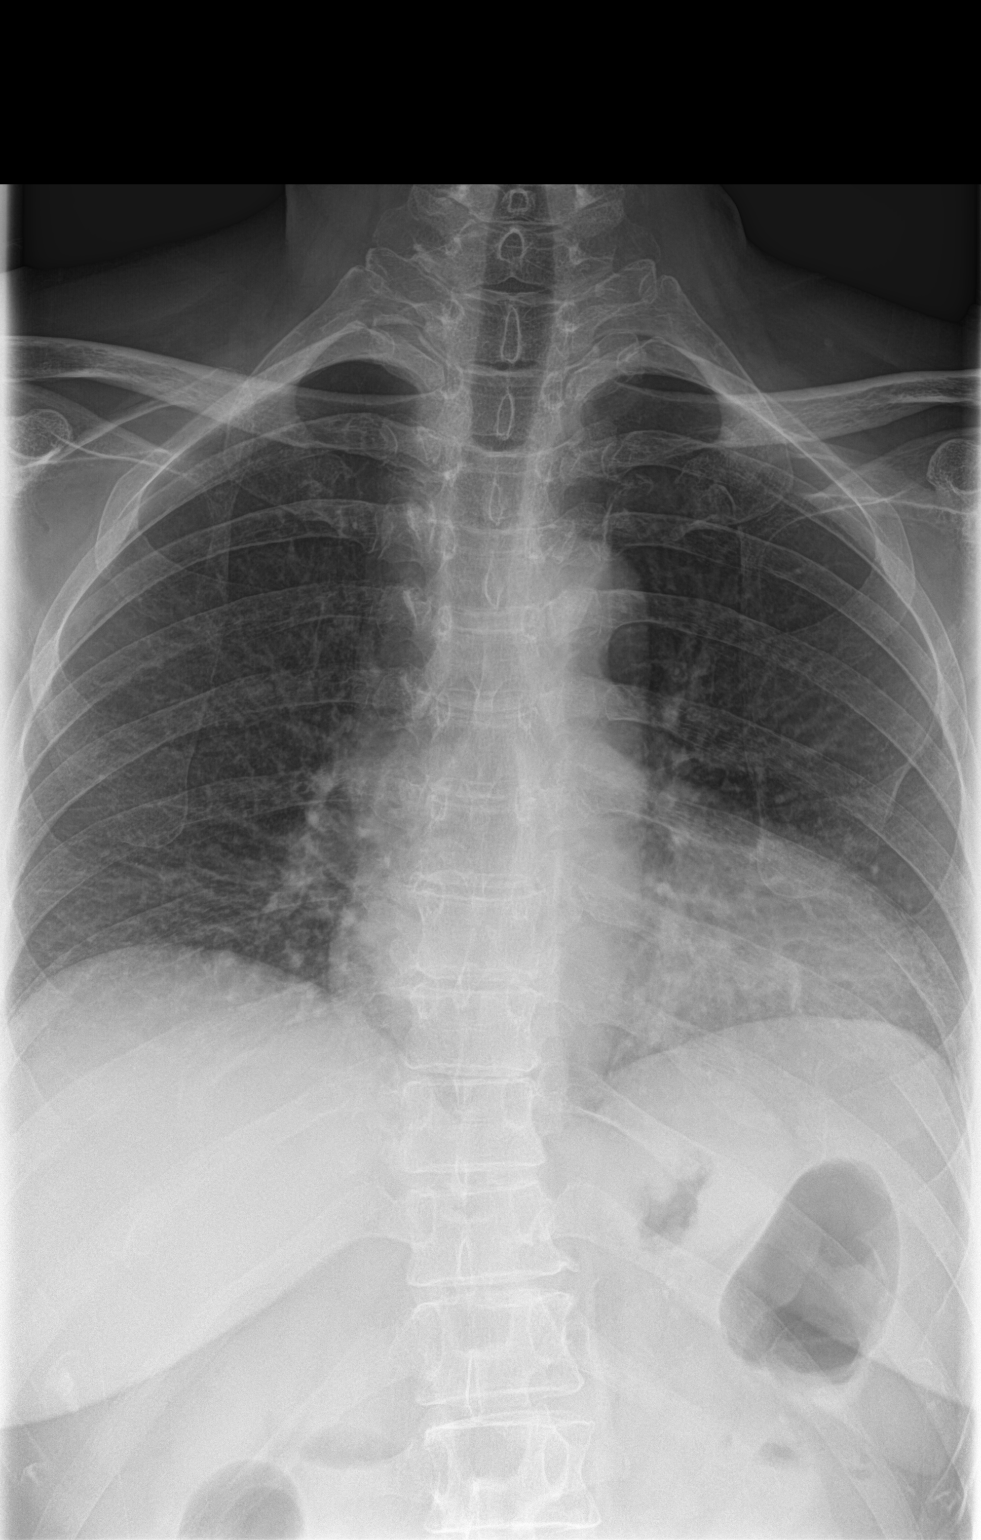

[t-spine lat]
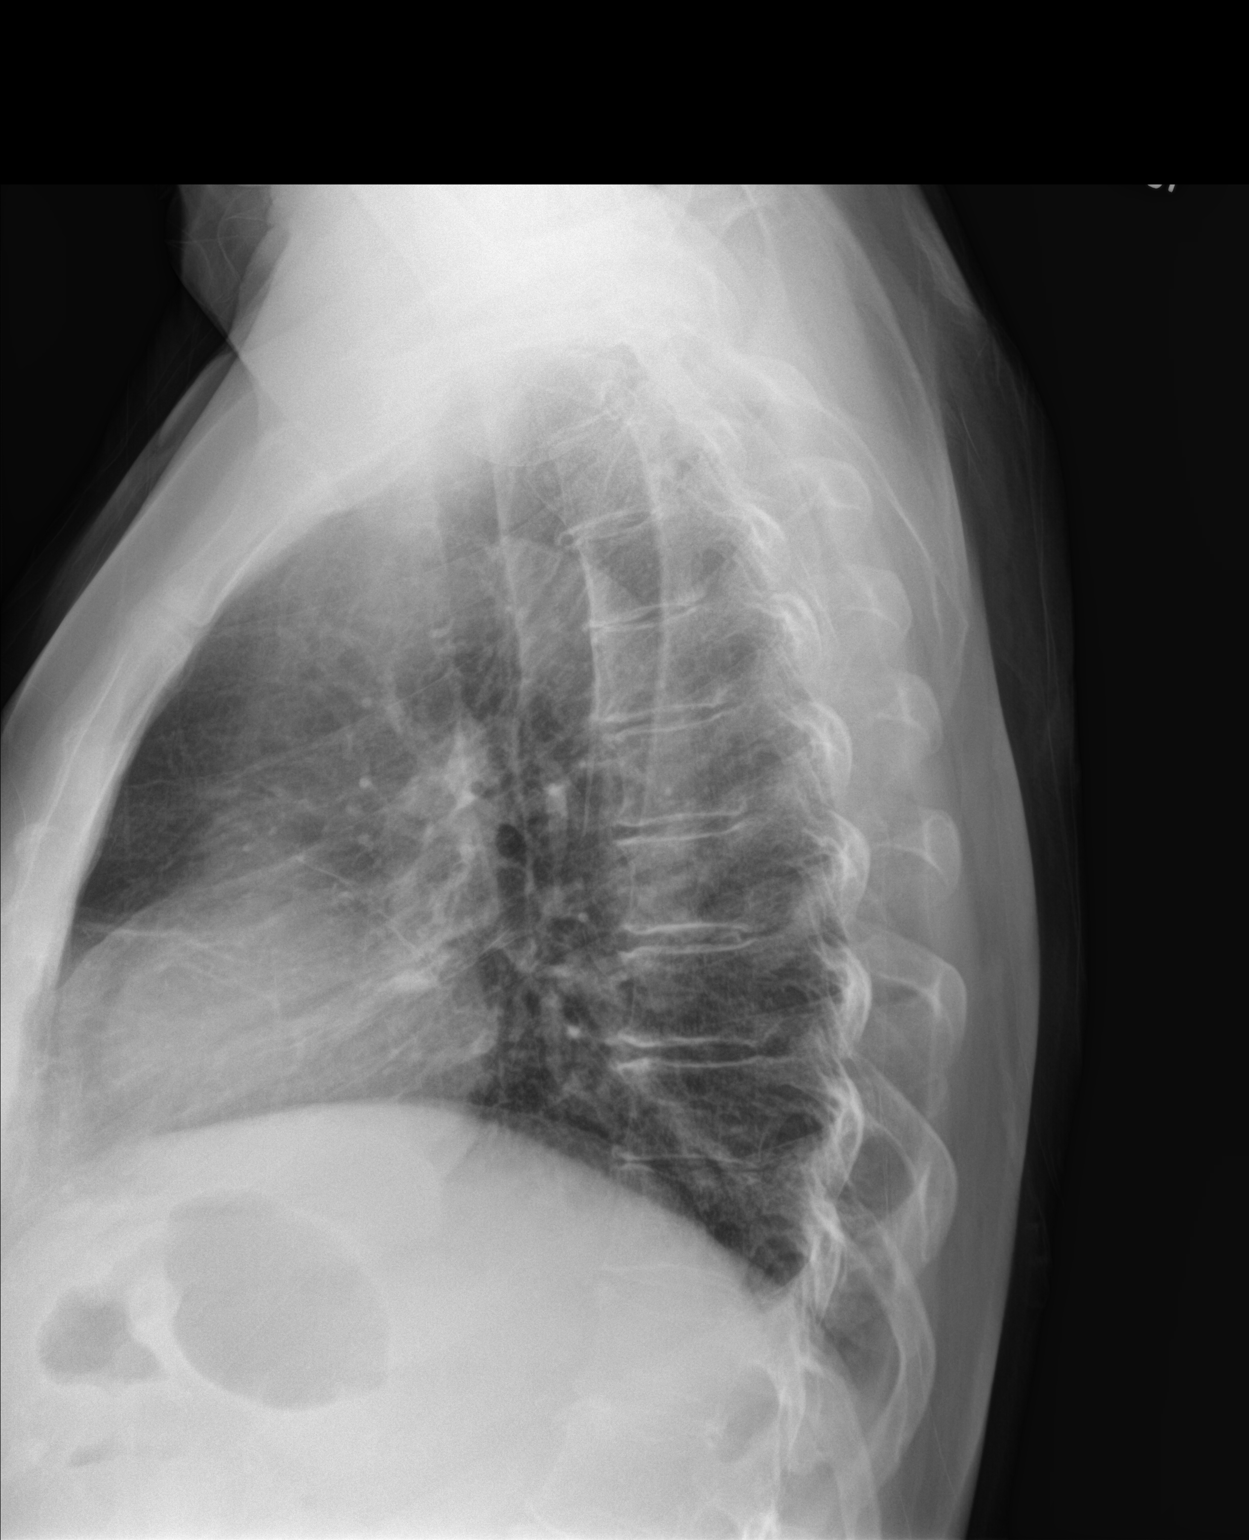

[t-spine swimmers]
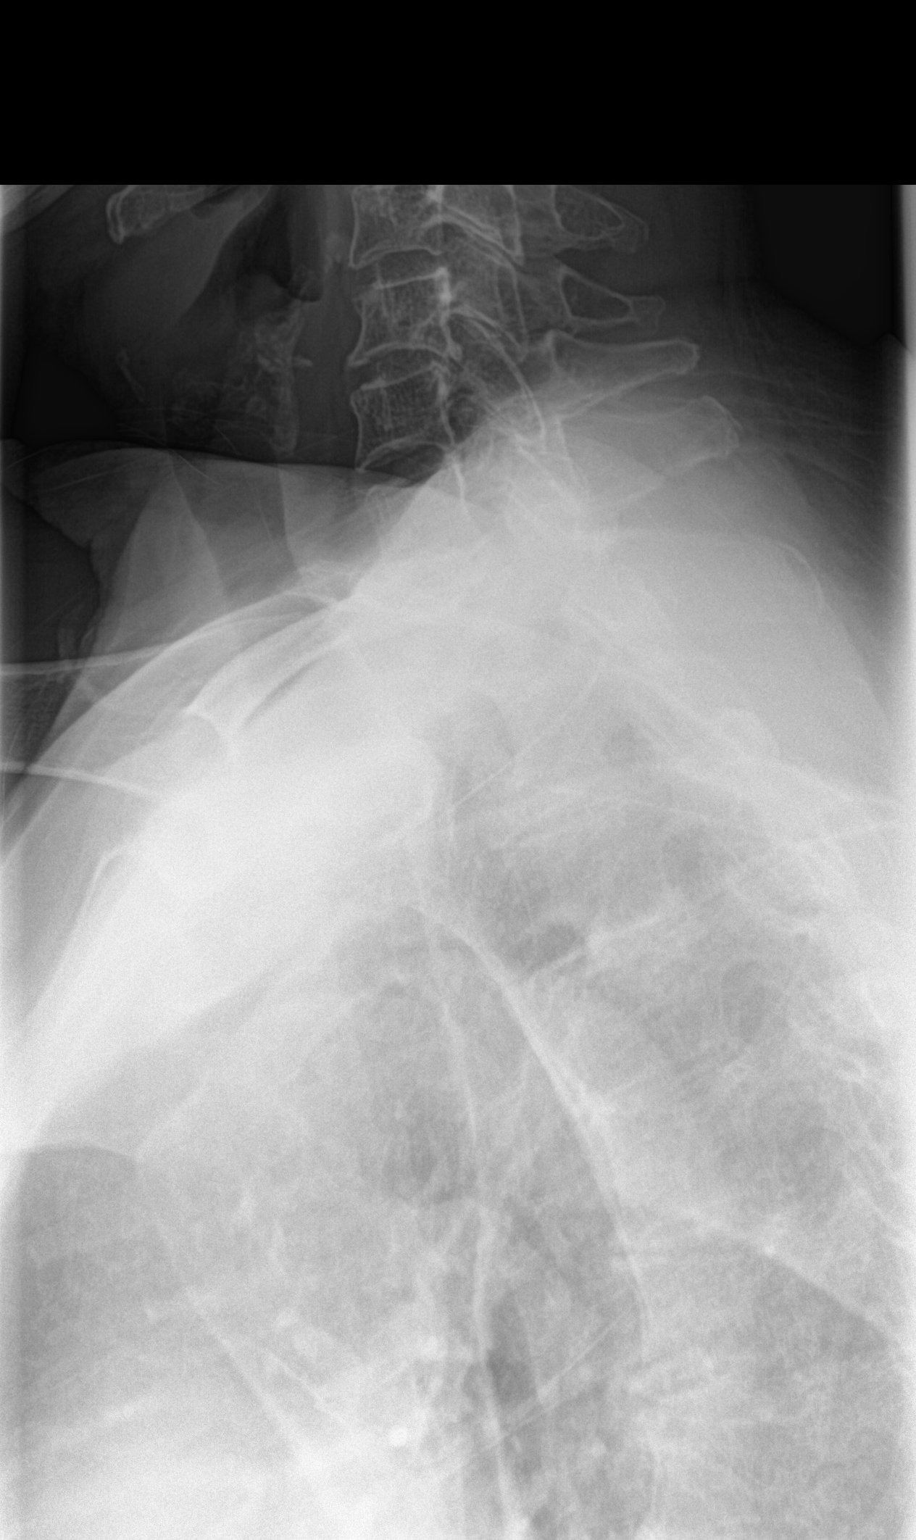

[3 of 3 positions shown; findings below may reference images not displayed]

FINDINGS: Normal thoracic segmentation. Mild dextroconvex thoracic scoliosis
is increased compared to [D7]. Otherwise stable vertebral height and
alignment. Relatively preserved disc spaces. Mild chronic thoracic
endplate spurring. Cervicothoracic junction alignment is within
normal limits. Posterior ribs and visible chest appears stable.
Negative visible bowel gas pattern. No acute osseous abnormality
identified.
IMPRESSION: Stable radiographic appearance of the thoracic spine from a year ago
aside from mildly increased dextroconvex scoliosis.

## 2018-11-21 NOTE — Assessment & Plan Note (Signed)
No improvement with home exercises, temporary improvement with tizanidine.  Check plain films of the lumbar spine today. Discussed to consider physical therapy, she will think about. Await results.

## 2018-11-21 NOTE — Patient Instructions (Signed)
Start AirBorne or Vitamin C daily as discussed.   Continue the tizanidine (Zanaflex) as needed to help relax the muscles.  Consider physical therapy given your pain.  Complete xray(s) prior to leaving today. I will notify you of your results once received.  Have a great trip! It was a pleasure meeting you!

## 2018-11-21 NOTE — Progress Notes (Signed)
Subjective:    Patient ID: April Ross, female    DOB: Jan 09, 1951, 68 y.o.   MRN: [id number]  HPI  April Ross is a 68 year old female with a history of diabetes, osteoporosis, CKD stage 3 who presents today with multiple complaints.  1) Fever: Began yesterday afternoon after waking from a nap with a temperature of 101.5. She noticed a gradual reduction of her fever throughout the night last night. She woke up this morning and her fever was gone. She denies chills, cough, sore throat. She has felt slightly achy but also has chronic back pain. She did not take anything for her symptoms yesterday or this morning.   2) Chronic Back Pain: Since Summer of 2019. Located to the bilateral mid thoracic back. She describes her pain as tight. She was provided exercises by PCP in November 2019 and has been working on them but has not noticed improvement. She's been using an OTC pain patch some days. She was prescribed a muscle relaxer (tizanidine) on 11/17/18 by Dr. Lorelei Pont which has helped temporarily with her symptoms, symptoms will return during the day. She does experience drowsiness with the tizanidine so she takes it at night. She denies radiculopathy, recent injury/trauma, weakness, shoulder/neck pain, lower back pain, urinary symptoms.   Review of Systems  Constitutional: Positive for fever. Negative for chills and fatigue.  HENT: Negative for congestion, ear pain, sinus pressure and sore throat.   Respiratory: Negative for cough.   Genitourinary: Negative for dysuria and frequency.  Musculoskeletal: Positive for back pain.       Past Medical History:  Diagnosis Date  . Allergy   . Arthritis   . CRD (chronic renal disease)   . Depression   . Diabetes mellitus without complication ([place])   . GERD (gastroesophageal reflux disease)   . Hyperlipidemia   . Hypertension   . LBBB (left bundle branch block)    a. diagnosed in 10/2017 - echo showed a preserved EF of 55-60% and NST was low-risk  showing no evidence of ischemia.   . polyps in colon   . Thyroid disease   . Urinary tract infection      Social History   Socioeconomic History  . Marital status: Widowed    Spouse name: Not on file  . Number of children: Not on file  . Years of education: Not on file  . Highest education level: Not on file  Occupational History  . Not on file  Social Needs  . Financial resource strain: Not on file  . Food insecurity:    Worry: Not on file    Inability: Not on file  . Transportation needs:    Medical: Not on file    Non-medical: Not on file  Tobacco Use  . Smoking status: Current Every Day Smoker    Packs/day: 0.25    Types: Cigarettes  . Smokeless tobacco: Never Used  Substance and Sexual Activity  . Alcohol use: Yes    Alcohol/week: 0.0 standard drinks    Comment: occ glass of wine or mix drink  . Drug use: No  . Sexual activity: Not Currently  Lifestyle  . Physical activity:    Days per week: Not on file    Minutes per session: Not on file  . Stress: Not on file  Relationships  . Social connections:    Talks on phone: Not on file    Gets together: Not on file    Attends religious service: Not on file  Active member of club or organization: Not on file    Attends meetings of clubs or organizations: Not on file    Relationship status: Not on file  . Intimate partner violence:    Fear of current or ex partner: Not on file    Emotionally abused: Not on file    Physically abused: Not on file    Forced sexual activity: Not on file  Other Topics Concern  . Not on file  Social History Narrative  . Not on file    Past Surgical History:  Procedure Laterality Date  . ABDOMINAL HYSTERECTOMY    . APPENDECTOMY      Family History  Problem Relation Age of Onset  . Alzheimer's disease Mother   . Hyperlipidemia Mother   . Hypertension Mother   . Asthma Maternal Grandmother   . Diabetes Maternal Grandfather   . Alcohol abuse Maternal Grandfather   .  Hyperlipidemia Maternal Grandfather   . Stroke Maternal Grandfather   . Hypertension Maternal Grandfather   . Diabetes Paternal Grandmother   . Alcohol abuse Maternal Uncle   . Cholecystitis Maternal Uncle   . Arthritis Maternal Uncle     Allergies  Allergen Reactions  . Acetaminophen   . Asa [Aspirin]   . Ibuprofen   . Other [place]    Mycolog-II & HRT Hormone patch    Current Outpatient Medications on File Prior to Visit  Medication Sig Dispense Refill  . alendronate (FOSAMAX) 70 MG tablet TAKE 1 TABLET EVERY 7 DAYS TAKE WITH A FULL GLASS OF WATER ON AN EMPTY STOMACH 12 tablet 3  . aspirin EC 81 MG tablet Take 81 mg by mouth daily.    . Calcium Carb-Cholecalciferol (CALCIUM-VITAMIN D) 500-200 MG-UNIT tablet Take 1 tablet by mouth daily.    . cetirizine (ZYRTEC) 10 MG tablet Take 10 mg by mouth at bedtime.    Marland Kitchen FLUoxetine (PROZAC) 40 MG capsule Take 1 capsule (40 mg total) by mouth daily. 90 capsule 1  . fluticasone (FLONASE) 50 MCG/ACT nasal spray PLACE 2 SPRAYS INTO BOTH NOSTRILS DAILY. 16 g 5  . levothyroxine (SYNTHROID, LEVOTHROID) 88 MCG tablet Take 88 mcg by mouth daily.  3  . losartan (COZAAR) 100 MG tablet Take 1 tablet (100 mg total) by mouth daily. 30 tablet 11  . metFORMIN (GLUCOPHAGE) 500 MG tablet TAKE 1 TABLET BY MOUTH 2 TIMES DAILY WITH A MEAL. 180 tablet 1  . omeprazole (PRILOSEC) 20 MG capsule TAKE 1 CAPSULE BY MOUTH EVERY DAY 90 capsule 1  . rosuvastatin (CRESTOR) 20 MG tablet TAKE 1 TABLET BY MOUTH EVERY DAY 90 tablet 3  . tiZANidine (ZANAFLEX) 4 MG tablet Take 1 tablet (4 mg total) by mouth at bedtime as needed for muscle spasms. 30 tablet 2  . traMADol (ULTRAM) 50 MG tablet Take 1 tablet (50 mg total) by mouth every 6 (six) hours as needed for moderate pain. 30 tablet 0  . traZODone (DESYREL) 100 MG tablet TAKE 1 TABLET BY MOUTH EVERYDAY AT BEDTIME 90 tablet 1   No current facility-administered medications on file prior to visit.     BP 126/82 (BP Location:  Left Arm, Patient Position: Sitting, Cuff Size: Normal)   Pulse 87   Temp 98.4 F (36.9 C) (Oral)   Ht 5' 2.25" (1.581 m)   Wt 140 lb 8 oz (63.7 kg)   SpO2 96%   BMI 25.49 kg/m    Objective:   Physical Exam  Constitutional: She appears well-nourished. She does  not appear ill.  HENT:  Right Ear: Tympanic membrane and ear canal normal.  Left Ear: Tympanic membrane and ear canal normal.  Nose: No mucosal edema. Right sinus exhibits no maxillary sinus tenderness and no frontal sinus tenderness. Left sinus exhibits no maxillary sinus tenderness and no frontal sinus tenderness.  Mouth/Throat: Oropharynx is clear and moist.  Neck: Neck supple.  Cardiovascular: Normal rate and regular rhythm.  Respiratory: Effort normal and breath sounds normal. She has no wheezes.  Musculoskeletal:     Thoracic back: She exhibits pain. She exhibits normal range of motion, no tenderness and no bony tenderness.       Back:  Skin: Skin is warm and dry.           Assessment & Plan:  Fever:  Occurred for approx 10 hours, no fever since late last night. Overall feels well. Exam today is benign. Unclear etiology. Will have her try Air Borne or Vitamin C for immune support, water, rest.  Pleas Koch, NP

## 2018-12-21 ENCOUNTER — Other Ambulatory Visit: Payer: Self-pay | Admitting: Family Medicine

## 2019-02-09 ENCOUNTER — Other Ambulatory Visit: Payer: Self-pay | Admitting: Family Medicine

## 2019-02-09 NOTE — Telephone Encounter (Signed)
Last office visit 11/21/2018 with Gentry Fitz for back pain.  Last refilled 11/18/2018 for #30 with 2 refills.  CPE scheduled for 09/01/2019.

## 2019-02-12 ENCOUNTER — Telehealth: Payer: Self-pay | Admitting: Family Medicine

## 2019-02-12 DIAGNOSIS — E1121 Type 2 diabetes mellitus with diabetic nephropathy: Secondary | ICD-10-CM

## 2019-02-12 NOTE — Telephone Encounter (Signed)
Pt returned Robin's phone call after office closed. Please advise

## 2019-02-12 NOTE — Telephone Encounter (Signed)
Left message asking pt to call office please schedule June for depression

## 2019-02-13 NOTE — Telephone Encounter (Signed)
Pt schedule follow up on depression 04/07/2019 She stated her my chart message stated she needs labs also.  Do you want labs prior to that appointment

## 2019-02-13 NOTE — Addendum Note (Signed)
Addended byEliezer Lofts E on: 02/13/2019 02:03 PM   Modules accepted: Orders

## 2019-02-13 NOTE — Telephone Encounter (Signed)
Left message asking pt to call office  PLEASE SCHEDULE follow up in June for depression

## 2019-02-13 NOTE — Telephone Encounter (Signed)
Yes

## 2019-02-19 ENCOUNTER — Telehealth: Payer: Self-pay | Admitting: Family Medicine

## 2019-02-19 NOTE — Telephone Encounter (Signed)
I spoke with pt to inform her I had already talked to her about her appointment in june

## 2019-02-19 NOTE — Telephone Encounter (Signed)
Pt called stating she was returning Robin's call from today. I did not see any documentation of where we had called, so I told her I would send a message over to [place].

## 2019-03-18 ENCOUNTER — Other Ambulatory Visit: Payer: Self-pay | Admitting: Family Medicine

## 2019-04-02 ENCOUNTER — Other Ambulatory Visit (INDEPENDENT_AMBULATORY_CARE_PROVIDER_SITE_OTHER): Payer: PPO

## 2019-04-02 DIAGNOSIS — E1121 Type 2 diabetes mellitus with diabetic nephropathy: Secondary | ICD-10-CM

## 2019-04-02 LAB — COMPREHENSIVE METABOLIC PANEL
ALT: 17 U/L (ref 0–35)
AST: 17 U/L (ref 0–37)
Albumin: 4.3 g/dL (ref 3.5–5.2)
Alkaline Phosphatase: 52 U/L (ref 39–117)
BUN: 20 mg/dL (ref 6–23)
CO2: 28 mEq/L (ref 19–32)
Calcium: 9.5 mg/dL (ref 8.4–10.5)
Chloride: 105 mEq/L (ref 96–112)
Creatinine, Ser: 1.44 mg/dL — ABNORMAL HIGH (ref 0.40–1.20)
GFR: 35.98 mL/min — ABNORMAL LOW (ref >60.00)
Glucose, Bld: 142 mg/dL — ABNORMAL HIGH (ref 70–99)
Potassium: 4.3 mEq/L (ref 3.5–5.1)
Sodium: 141 mEq/L (ref 135–145)
Total Bilirubin: 0.4 mg/dL (ref 0.2–1.2)
Total Protein: 6.3 g/dL (ref 6.0–8.3)

## 2019-04-02 LAB — LIPID PANEL
Cholesterol: 157 mg/dL (ref 0–200)
HDL: 35.2 mg/dL — ABNORMAL LOW (ref >39.00)
NonHDL: 121.34
Total CHOL/HDL Ratio: 4
Triglycerides: 232 mg/dL — ABNORMAL HIGH (ref 0.0–149.0)
VLDL: 46.4 mg/dL — ABNORMAL HIGH (ref 0.0–40.0)

## 2019-04-02 LAB — LDL CHOLESTEROL, DIRECT: Direct LDL: 93 mg/dL

## 2019-04-02 LAB — HEMOGLOBIN A1C: Hgb A1c MFr Bld: 6.9 % — ABNORMAL HIGH (ref 4.6–6.5)

## 2019-04-06 ENCOUNTER — Telehealth: Payer: Self-pay | Admitting: Family Medicine

## 2019-04-06 NOTE — Telephone Encounter (Signed)
Left message asking pt to call office see if pt can do virtual appointment.  If so please mark how she wants to do appointment   TEXT  Get phone number EMAIL get email address.  If no please put in office and give covid screeing  Put information in appointment notes

## 2019-04-07 ENCOUNTER — Ambulatory Visit (INDEPENDENT_AMBULATORY_CARE_PROVIDER_SITE_OTHER): Payer: PPO | Admitting: Family Medicine

## 2019-04-07 ENCOUNTER — Other Ambulatory Visit: Payer: Self-pay | Admitting: Family Medicine

## 2019-04-07 ENCOUNTER — Other Ambulatory Visit: Payer: Self-pay

## 2019-04-07 ENCOUNTER — Encounter: Payer: Self-pay | Admitting: Family Medicine

## 2019-04-07 DIAGNOSIS — F321 Major depressive disorder, single episode, moderate: Secondary | ICD-10-CM

## 2019-04-07 DIAGNOSIS — E7800 Pure hypercholesterolemia, unspecified: Secondary | ICD-10-CM

## 2019-04-07 DIAGNOSIS — N183 Chronic kidney disease, stage 3 unspecified: Secondary | ICD-10-CM

## 2019-04-07 DIAGNOSIS — E1121 Type 2 diabetes mellitus with diabetic nephropathy: Secondary | ICD-10-CM

## 2019-04-07 MED ORDER — BUPROPION HCL ER (XL) 150 MG PO TB24: 150.0000 mg | ORAL_TABLET | Freq: Every day | ORAL | 5 refills | Status: DC

## 2019-04-07 NOTE — Patient Instructions (Addendum)
Start wellbutrin in morning in additions to daily fluoxetine.  Follow up in 4 weeks.  Set up yearly eye exam.

## 2019-04-07 NOTE — Progress Notes (Signed)
VIRTUAL VISIT Due to national recommendations of social distancing due to [place] 19, a virtual visit is felt to be most appropriate for this patient at this time.   I connected with the patient on 04/07/19 at  8:20 AM EDT by virtual telehealth platform and verified that I am speaking with the correct person using two identifiers.   I discussed the limitations, risks, security and privacy concerns of performing an evaluation and management service by  virtual telehealth platform and the availability of in person appointments. I also discussed with the patient that there may be a patient responsible charge related to this service. The patient expressed understanding and agreed to proceed.  Patient location: Home Provider Location: [facility] Participants: Eliezer Lofts and Laymond Purser Sires   Chief Complaint  Patient presents with  . Follow-up    Depression    History of Present Illness:  68 year old female presents for follow up 6 months MDD, Dm  MDD  She is now on fluoxetine 40 mg daily. She is using trazodone for sleep. She sleeps fairly well but continues to feel tired and have anhedonia. Depression screen [facility] 2/9 04/07/2019 09/26/2018 08/20/2018  Decreased Interest 3 1 2   Down, Depressed, Hopeless 1 1 3   PHQ - 2 Score 4 2 5   Altered sleeping 1 1 2   Tired, decreased energy 3 3 3   Change in appetite 1 0 1  Feeling bad or failure about yourself  0 2 1  Trouble concentrating 0 0 1  Moving slowly or fidgety/restless 0 0 0  Suicidal thoughts 0 0 0  PHQ-9 Score 9 8 13   Difficult doing work/chores Somewhat difficult Somewhat difficult Somewhat difficult   She is moderately bothered by decrease in energy and emotion overall.  Diabetes:  At goal A1C < 7 on metformin Using medications without difficulties: Hypoglycemic episodes:none Hyperglycemic episodes:none Feet problems:none Blood Sugars averaging: eye exam within last year: overdue due to covid  .LDL at goal < 100 on  crestor  Diet: good Body mass index is 26.5 kg/m.  Exercise: walking  COVID 19 screen No recent travel or known exposure to COVID19 The patient denies respiratory symptoms of COVID 19 at this time.  The importance of social distancing was discussed today.   Review of Systems  Constitutional: Negative for chills and fever.  HENT: Negative for congestion and ear pain.   Eyes: Negative for pain and redness.  Respiratory: Negative for cough and shortness of breath.   Cardiovascular: Negative for chest pain, palpitations and leg swelling.  Gastrointestinal: Negative for abdominal pain, blood in stool, constipation, diarrhea, nausea and vomiting.  Genitourinary: Negative for dysuria.  Musculoskeletal: Negative for falls and myalgias.  Skin: Negative for Martos.  Neurological: Negative for dizziness.  Psychiatric/Behavioral: Negative for depression. The patient is not nervous/anxious.       Past Medical History:  Diagnosis Date  . Allergy   . Arthritis   . CRD (chronic renal disease)   . Depression   . Diabetes mellitus without complication ([place])   . GERD (gastroesophageal reflux disease)   . Hyperlipidemia   . Hypertension   . LBBB (left bundle branch block)    a. diagnosed in 10/2017 - echo showed a preserved EF of 55-60% and NST was low-risk showing no evidence of ischemia.   . polyps in colon   . Thyroid disease   . Urinary tract infection     reports that she has been smoking cigarettes. She has been smoking about  0.25 packs per day. She has never used smokeless tobacco. She reports current alcohol use. She reports that she does not use drugs.   Current Outpatient Medications:  .  alendronate (FOSAMAX) 70 MG tablet, TAKE 1 TABLET EVERY 7 DAYS TAKE WITH A FULL GLASS OF WATER ON AN EMPTY STOMACH, Disp: 12 tablet, Rfl: 3 .  aspirin EC 81 MG tablet, Take 81 mg by mouth daily., Disp: , Rfl:  .  Calcium Carb-Cholecalciferol (CALCIUM-VITAMIN D) 500-200 MG-UNIT tablet, Take 1 tablet  by mouth daily., Disp: , Rfl:  .  cetirizine (ZYRTEC) 10 MG tablet, Take 10 mg by mouth at bedtime., Disp: , Rfl:  .  FLUoxetine (PROZAC) 40 MG capsule, Take 1 capsule (40 mg total) by mouth daily., Disp: 90 capsule, Rfl: 1 .  fluticasone (FLONASE) 50 MCG/ACT nasal spray, PLACE 2 SPRAYS INTO BOTH NOSTRILS DAILY., Disp: 16 g, Rfl: 5 .  levothyroxine (SYNTHROID, LEVOTHROID) 88 MCG tablet, TAKE 1 TABLET BY MOUTH EVERY DAY, Disp: 90 tablet, Rfl: 2 .  losartan (COZAAR) 100 MG tablet, Take 1 tablet (100 mg total) by mouth daily., Disp: 30 tablet, Rfl: 11 .  omeprazole (PRILOSEC) 20 MG capsule, TAKE 1 CAPSULE BY MOUTH EVERY DAY, Disp: 90 capsule, Rfl: 1 .  rosuvastatin (CRESTOR) 20 MG tablet, TAKE 1 TABLET BY MOUTH EVERY DAY, Disp: 90 tablet, Rfl: 3 .  tiZANidine (ZANAFLEX) 4 MG tablet, TAKE 1 TABLET BY MOUTH AT BEDTIME AS NEEDED FOR MUSCLE SPASMS., Disp: 90 tablet, Rfl: 0 .  traZODone (DESYREL) 100 MG tablet, TAKE 1 TABLET BY MOUTH EVERYDAY AT BEDTIME, Disp: 90 tablet, Rfl: 1 .  metFORMIN (GLUCOPHAGE) 500 MG tablet, TAKE 1 TABLET BY MOUTH 2 TIMES DAILY WITH A MEAL., Disp: 180 tablet, Rfl: 1   Observations/Objective: Temperature 98.7 F (37.1 C), temperature source Oral, height 5' 2.25" (1.581 m), weight 146 lb 1 oz (66.3 kg).  Physical Exam  Physical Exam Constitutional:      General: The patient is not in acute distress. Pulmonary:     Effort: Pulmonary effort is normal. No respiratory distress.  Neurological:     Mental Status: The patient is alert and oriented to person, place, and time.  Psychiatric:        Mood and Affect: Mood normal.        Behavior: Behavior normal.  Assessment and Plan  Type 2 diabetes mellitus with nephropathy (HCC) Well controlled. Continue current medication.  Encouraged exercise, weight loss, healthy eating habits.   Pure hypercholesterolemia Well controlled. Continue current medication.  on statin.  CKD (chronic kidney disease) stage 3, GFR 30-59 ml/min  (HCC) Stable, increase water.  Moderate major depression, single episode (HCC)  Significant fatigue and anhedonia... will try addition of wellbutrin to fluoxetine to  Try to remedy thios issue. Otherwise pt mood tolerable.     I discussed the assessment and treatment plan with the patient. The patient was provided an opportunity to ask questions and all were answered. The patient agreed with the plan and demonstrated an understanding of the instructions.   The patient was advised to call back or seek an in-person evaluation if the symptoms worsen or if the condition fails to improve as anticipated.     Eliezer Lofts, MD

## 2019-04-07 NOTE — Assessment & Plan Note (Signed)
Significant fatigue and anhedonia... will try addition of wellbutrin to fluoxetine to  Try to remedy thios issue. Otherwise pt mood tolerable.

## 2019-04-07 NOTE — Assessment & Plan Note (Signed)
Well controlled. Continue current medication. Encouraged exercise, weight loss, healthy eating habits.  

## 2019-04-07 NOTE — Assessment & Plan Note (Signed)
Well controlled. Continue current medication.  on statin. 

## 2019-04-07 NOTE — Assessment & Plan Note (Signed)
Stable, increase water.

## 2019-04-08 NOTE — Progress Notes (Signed)
Appointment 7/28 Pt aware

## 2019-04-29 ENCOUNTER — Other Ambulatory Visit: Payer: Self-pay | Admitting: Family Medicine

## 2019-05-04 ENCOUNTER — Other Ambulatory Visit: Payer: Self-pay | Admitting: Family Medicine

## 2019-05-05 ENCOUNTER — Other Ambulatory Visit: Payer: Self-pay

## 2019-05-05 ENCOUNTER — Encounter: Payer: Self-pay | Admitting: Family Medicine

## 2019-05-05 ENCOUNTER — Ambulatory Visit (INDEPENDENT_AMBULATORY_CARE_PROVIDER_SITE_OTHER): Payer: PPO | Admitting: Family Medicine

## 2019-05-05 DIAGNOSIS — F321 Major depressive disorder, single episode, moderate: Secondary | ICD-10-CM

## 2019-05-05 NOTE — Assessment & Plan Note (Signed)
Improved with prozac 40 mg and wellbutrin. SE are negligable. She has more energy now and is happier altogether.

## 2019-05-05 NOTE — Progress Notes (Signed)
Chief Complaint  Patient presents with  . Follow-up    Depression    History of Present Illness: HPI  68 year old female presents for follow up depression.  MDD: at last OV 1 month ago.. she had improvement in mood but medication had given her fatigue and anhedonia.   Added Wellbutrin for energy in addition to continuing fluoxetine 40 mg daily.  Today she reports she has more energy, less depression days.  She is again enjoying to read more like she used to.  She is continuing to isolate for pandemic. Her daughter has noted she is happier.  She is sleeping 6 hours a night.    No SE to medication.Marland Kitchen except occ lightheadedness.   PHQ9: 6   COVID 19 screen No recent travel or known exposure to COVID19 The patient denies respiratory symptoms of COVID 19 at this time.  The importance of social distancing was discussed today.   Review of Systems  Constitutional: Negative for chills and fever.  HENT: Negative for congestion and ear pain.   Eyes: Negative for pain and redness.  Respiratory: Negative for cough and shortness of breath.   Cardiovascular: Negative for chest pain, palpitations and leg swelling.  Gastrointestinal: Negative for abdominal pain, blood in stool, constipation, diarrhea, nausea and vomiting.  Genitourinary: Negative for dysuria.  Musculoskeletal: Positive for back pain. Negative for falls and myalgias.       Upper back pain  Skin: Negative for [place].  Neurological: Negative for dizziness.  Psychiatric/Behavioral: Negative for depression. The patient is not nervous/anxious.       Past Medical History:  Diagnosis Date  . Allergy   . Arthritis   . CRD (chronic renal disease)   . Depression   . Diabetes mellitus without complication ([place])   . GERD (gastroesophageal reflux disease)   . Hyperlipidemia   . Hypertension   . LBBB (left bundle branch block)    a. diagnosed in 10/2017 - echo showed a preserved EF of 55-60% and NST was low-risk showing no  evidence of ischemia.   . polyps in colon   . Thyroid disease   . Urinary tract infection     reports that she has been smoking cigarettes. She has been smoking about 0.25 packs per day. She has never used smokeless tobacco. She reports current alcohol use. She reports that she does not use drugs.   Current Outpatient Medications:  .  alendronate (FOSAMAX) 70 MG tablet, TAKE 1 TABLET EVERY 7 DAYS TAKE WITH A FULL GLASS OF WATER ON AN EMPTY STOMACH, Disp: 12 tablet, Rfl: 3 .  aspirin EC 81 MG tablet, Take 81 mg by mouth daily., Disp: , Rfl:  .  buPROPion (WELLBUTRIN XL) 150 MG 24 hr tablet, TAKE 1 TABLET BY MOUTH EVERY DAY, Disp: 90 tablet, Rfl: 1 .  Calcium Carb-Cholecalciferol (CALCIUM-VITAMIN D) 500-200 MG-UNIT tablet, Take 1 tablet by mouth daily., Disp: , Rfl:  .  cetirizine (ZYRTEC) 10 MG tablet, Take 10 mg by mouth at bedtime., Disp: , Rfl:  .  FLUoxetine (PROZAC) 40 MG capsule, Take 1 capsule (40 mg total) by mouth daily., Disp: 90 capsule, Rfl: 1 .  fluticasone (FLONASE) 50 MCG/ACT nasal spray, PLACE 2 SPRAYS INTO BOTH NOSTRILS DAILY., Disp: 16 g, Rfl: 5 .  levothyroxine (SYNTHROID, LEVOTHROID) 88 MCG tablet, TAKE 1 TABLET BY MOUTH EVERY DAY, Disp: 90 tablet, Rfl: 2 .  losartan (COZAAR) 100 MG tablet, Take 1 tablet (100 mg total) by mouth daily., Disp: 30 tablet, Rfl:  11 .  metFORMIN (GLUCOPHAGE) 500 MG tablet, TAKE 1 TABLET BY MOUTH 2 TIMES DAILY WITH A MEAL., Disp: 180 tablet, Rfl: 1 .  omeprazole (PRILOSEC) 20 MG capsule, TAKE 1 CAPSULE BY MOUTH EVERY DAY, Disp: 90 capsule, Rfl: 1 .  rosuvastatin (CRESTOR) 20 MG tablet, TAKE 1 TABLET BY MOUTH EVERY DAY, Disp: 90 tablet, Rfl: 3 .  tiZANidine (ZANAFLEX) 4 MG tablet, TAKE 1 TABLET BY MOUTH AT BEDTIME AS NEEDED FOR MUSCLE SPASMS., Disp: 90 tablet, Rfl: 0 .  traZODone (DESYREL) 100 MG tablet, TAKE 1 TABLET BY MOUTH EVERYDAY AT BEDTIME, Disp: 90 tablet, Rfl: 1   Observations/Objective: Blood pressure 140/80, pulse 80, temperature 97.9 F  (36.6 C), temperature source Temporal, height 5' 2.25" (1.581 m), weight 144 lb 8 oz (65.5 kg).  Physical Exam Constitutional:      General: She is not in acute distress.    Appearance: Normal appearance. She is well-developed. She is not ill-appearing or toxic-appearing.  HENT:     Head: Normocephalic.     Right Ear: Hearing, tympanic membrane, ear canal and external ear normal. Tympanic membrane is not erythematous, retracted or bulging.     Left Ear: Hearing, tympanic membrane, ear canal and external ear normal. Tympanic membrane is not erythematous, retracted or bulging.     Nose: No mucosal edema or rhinorrhea.     Right Sinus: No maxillary sinus tenderness or frontal sinus tenderness.     Left Sinus: No maxillary sinus tenderness or frontal sinus tenderness.     Mouth/Throat:     Pharynx: Uvula midline.  Eyes:     General: Lids are normal. Lids are everted, no foreign bodies appreciated.     Conjunctiva/sclera: Conjunctivae normal.     Pupils: Pupils are equal, round, and reactive to light.  Neck:     Musculoskeletal: Normal range of motion and neck supple.     Thyroid: No thyroid mass or thyromegaly.     Vascular: No carotid bruit.     Trachea: Trachea normal.  Cardiovascular:     Rate and Rhythm: Normal rate and regular rhythm.     Pulses: Normal pulses.     Heart sounds: Normal heart sounds, S1 normal and S2 normal. No murmur. No friction rub. No gallop.   Pulmonary:     Effort: Pulmonary effort is normal. No tachypnea or respiratory distress.     Breath sounds: Normal breath sounds. No decreased breath sounds, wheezing, rhonchi or rales.  Abdominal:     General: Bowel sounds are normal.     Palpations: Abdomen is soft.     Tenderness: There is no abdominal tenderness.  Skin:    General: Skin is warm and dry.     Findings: No Dolezal.  Neurological:     Mental Status: She is alert.  Psychiatric:        Mood and Affect: Mood is not anxious or depressed.        Speech:  Speech normal.        Behavior: Behavior normal. Behavior is cooperative.        Thought Content: Thought content normal.        Judgment: Judgment normal.      Assessment and Plan   Moderate major depression, single episode (HCC) Improved with prozac 40 mg and wellbutrin. SE are negligable. She has more energy now and is happier altogether.     Eliezer Lofts, MD

## 2019-05-05 NOTE — Patient Instructions (Signed)
Keep working on regular exercise and positivity!

## 2019-06-13 ENCOUNTER — Other Ambulatory Visit: Payer: Self-pay | Admitting: Family Medicine

## 2019-06-13 NOTE — Telephone Encounter (Signed)
Last filled on 09/24/2018 # 90 with 1 refill LOV 05/05/2019 Future appointment on 09/01/2019

## 2019-07-04 ENCOUNTER — Other Ambulatory Visit: Payer: Self-pay | Admitting: Family Medicine

## 2019-07-05 ENCOUNTER — Other Ambulatory Visit: Payer: Self-pay | Admitting: Family Medicine

## 2019-08-03 ENCOUNTER — Ambulatory Visit: Payer: PPO | Admitting: Family Medicine

## 2019-08-04 ENCOUNTER — Ambulatory Visit (INDEPENDENT_AMBULATORY_CARE_PROVIDER_SITE_OTHER): Payer: PPO | Admitting: Family Medicine

## 2019-08-04 ENCOUNTER — Other Ambulatory Visit: Payer: Self-pay

## 2019-08-04 ENCOUNTER — Encounter: Payer: Self-pay | Admitting: Family Medicine

## 2019-08-04 DIAGNOSIS — H6981 Other specified disorders of Eustachian tube, right ear: Secondary | ICD-10-CM | POA: Diagnosis not present

## 2019-08-04 MED ORDER — TRAMADOL HCL 50 MG PO TABS: 50.0000 mg | ORAL_TABLET | Freq: Three times a day (TID) | ORAL | 0 refills | Status: AC | PRN

## 2019-08-04 MED ORDER — FLUTICASONE PROPIONATE 50 MCG/ACT NA SUSP: 2.0000 | Freq: Every day | NASAL | 5 refills | Status: DC

## 2019-08-04 NOTE — Assessment & Plan Note (Signed)
Treat with allergy treatment and  Nasal steroid.  If not improving consider pred taper, then possible ENT referral.

## 2019-08-04 NOTE — Patient Instructions (Addendum)
Start flonase 2 spray per nostril daily x 1-2 weeks.  If not improving call for possible prednisone taper  Or ENT referral   Eustachian Tube Dysfunction  Eustachian tube dysfunction refers to a condition in which a blockage develops in the narrow passage that connects the middle ear to the back of the nose (eustachian tube). The eustachian tube regulates air pressure in the middle ear by letting air move between the ear and nose. It also helps to drain fluid from the middle ear space. Eustachian tube dysfunction can affect one or both ears. When the eustachian tube does not function properly, air pressure, fluid, or both can build up in the middle ear. What are the causes? This condition occurs when the eustachian tube becomes blocked or cannot open normally. Common causes of this condition include:  Ear infections.  Colds and other infections that affect the nose, mouth, and throat (upper respiratory tract).  Allergies.  Irritation from cigarette smoke.  Irritation from stomach acid coming up into the esophagus (gastroesophageal reflux). The esophagus is the tube that carries food from the mouth to the stomach.  Sudden changes in air pressure, such as from descending in an airplane or scuba diving.  Abnormal growths in the nose or throat, such as: ? Growths that line the nose (nasal polyps). ? Abnormal growth of cells (tumors). ? Enlarged tissue at the back of the throat (adenoids). What increases the risk? You are more likely to develop this condition if:  You smoke.  You are overweight.  You are a child who has: ? Certain birth defects of the mouth, such as cleft palate. ? Large tonsils or adenoids. What are the signs or symptoms? Common symptoms of this condition include:  A feeling of fullness in the ear.  Ear pain.  Clicking or popping noises in the ear.  Ringing in the ear.  Hearing loss.  Loss of balance.  Dizziness. Symptoms may get worse when the air  pressure around you changes, such as when you travel to an area of high elevation, fly on an airplane, or go scuba diving. How is this diagnosed? This condition may be diagnosed based on:  Your symptoms.  A physical exam of your ears, nose, and throat.  Tests, such as those that measure: ? The movement of your eardrum (tympanogram). ? Your hearing (audiometry). How is this treated? Treatment depends on the cause and severity of your condition.  In mild cases, you may relieve your symptoms by moving air into your ears. This is called "popping the ears."  In more severe cases, or if you have symptoms of fluid in your ears, treatment may include: ? Medicines to relieve congestion (decongestants). ? Medicines that treat allergies (antihistamines). ? Nasal sprays or ear drops that contain medicines that reduce swelling (steroids). ? A procedure to drain the fluid in your eardrum (myringotomy). In this procedure, a small tube is placed in the eardrum to:  Drain the fluid.  Restore the air in the middle ear space. ? A procedure to insert a balloon device through the nose to inflate the opening of the eustachian tube (balloon dilation). Follow these instructions at home: Lifestyle  Do not do any of the following until your health care provider approves: ? Travel to high altitudes. ? Fly in airplanes. ? Work in a Pension scheme manager or room. ? Scuba dive.  Do not use any products that contain nicotine or tobacco, such as cigarettes and e-cigarettes. If you need help quitting, ask your  health care provider.  Keep your ears dry. Wear fitted earplugs during showering and bathing. Dry your ears completely after. General instructions  Take over-the-counter and prescription medicines only as told by your health care provider.  Use techniques to help pop your ears as recommended by your health care provider. These may include: ? Chewing gum. ? Yawning. ? Frequent, forceful  swallowing. ? Closing your mouth, holding your nose closed, and gently blowing as if you are trying to blow air out of your nose.  Keep all follow-up visits as told by your health care provider. This is important. Contact a health care provider if:  Your symptoms do not go away after treatment.  Your symptoms come back after treatment.  You are unable to pop your ears.  You have: ? A fever. ? Pain in your ear. ? Pain in your head or neck. ? Fluid draining from your ear.  Your hearing suddenly changes.  You become very dizzy.  You lose your balance. Summary  Eustachian tube dysfunction refers to a condition in which a blockage develops in the eustachian tube.  It can be caused by ear infections, allergies, inhaled irritants, or abnormal growths in the nose or throat.  Symptoms include ear pain, hearing loss, or ringing in the ears.  Mild cases are treated with maneuvers to unblock the ears, such as yawning or ear popping.  Severe cases are treated with medicines. Surgery may also be done (rare). This information is not intended to replace advice given to you by your health care provider. Make sure you discuss any questions you have with your health care provider. Document Released: 10/21/2015 Document Revised: 01/14/2018 Document Reviewed: 01/14/2018 Elsevier Patient Education  2020 [employer]. .

## 2019-08-04 NOTE — Progress Notes (Signed)
Chief Complaint  Patient presents with  . Swishing Sound in Right Ear    History of Present Illness: HPI  68 year old female presents with new onset sound in  Right ear several times a day for past few months.. Has been going on for several month.  no ear pressure, decreased hearing in right ear. Does not sound like heart beat.    Feels imbalanced x 2-3 weeks.. no vertigo.  Occ headache, nothing new or associated.  No runny nose , no sneezing.  Does have PND, worse in fall in spring. Marland Kitchen   COVID 19 screen No recent travel or known exposure to COVID19 The patient denies respiratory symptoms of COVID 19 at this time.  The importance of social distancing was discussed today.   ROS    Past Medical History:  Diagnosis Date  . Allergy   . Arthritis   . CRD (chronic renal disease)   . Depression   . Diabetes mellitus without complication ([place])   . GERD (gastroesophageal reflux disease)   . Hyperlipidemia   . Hypertension   . LBBB (left bundle branch block)    a. diagnosed in 10/2017 - echo showed a preserved EF of 55-60% and NST was low-risk showing no evidence of ischemia.   . polyps in colon   . Thyroid disease   . Urinary tract infection     reports that she has been smoking cigarettes. She has been smoking about 0.25 packs per day. She has never used smokeless tobacco. She reports current alcohol use. She reports that she does not use drugs.   Current Outpatient Medications:  .  alendronate (FOSAMAX) 70 MG tablet, TAKE 1 TABLET BY MOUTH EVERY 7 DAYS WITH A FULL GLASS OF WATER ON AN EMPTY STOMACH, Disp: 12 tablet, Rfl: 0 .  aspirin EC 81 MG tablet, Take 81 mg by mouth daily., Disp: , Rfl:  .  buPROPion (WELLBUTRIN XL) 150 MG 24 hr tablet, TAKE 1 TABLET BY MOUTH EVERY DAY, Disp: 90 tablet, Rfl: 1 .  Calcium Carb-Cholecalciferol (CALCIUM-VITAMIN D) 500-200 MG-UNIT tablet, Take 1 tablet by mouth daily., Disp: , Rfl:  .  cetirizine (ZYRTEC) 10 MG tablet, Take 10 mg by mouth at  bedtime., Disp: , Rfl:  .  FLUoxetine (PROZAC) 40 MG capsule, TAKE 1 CAPSULE BY MOUTH EVERY DAY, Disp: 90 capsule, Rfl: 1 .  fluticasone (FLONASE) 50 MCG/ACT nasal spray, PLACE 2 SPRAYS INTO BOTH NOSTRILS DAILY., Disp: 16 g, Rfl: 5 .  levothyroxine (SYNTHROID, LEVOTHROID) 88 MCG tablet, TAKE 1 TABLET BY MOUTH EVERY DAY, Disp: 90 tablet, Rfl: 2 .  losartan (COZAAR) 100 MG tablet, Take 1 tablet (100 mg total) by mouth daily., Disp: 30 tablet, Rfl: 11 .  metFORMIN (GLUCOPHAGE) 500 MG tablet, TAKE 1 TABLET BY MOUTH 2 TIMES DAILY WITH A MEAL., Disp: 180 tablet, Rfl: 1 .  omeprazole (PRILOSEC) 20 MG capsule, TAKE 1 CAPSULE BY MOUTH EVERY DAY, Disp: 90 capsule, Rfl: 1 .  rosuvastatin (CRESTOR) 20 MG tablet, TAKE 1 TABLET BY MOUTH EVERY DAY, Disp: 90 tablet, Rfl: 3 .  tiZANidine (ZANAFLEX) 4 MG tablet, TAKE 1 TABLET BY MOUTH AT BEDTIME AS NEEDED FOR MUSCLE SPASMS., Disp: 90 tablet, Rfl: 0 .  traZODone (DESYREL) 100 MG tablet, TAKE 1 TABLET BY MOUTH AT BEDTIME, Disp: 90 tablet, Rfl: 2   Observations/Objective: Blood pressure (!) 152/86, pulse 76, temperature 98.7 F (37.1 C), temperature source Temporal, height 5' 2.25" (1.581 m), weight 151 lb 8 oz (68.7 kg), SpO2  97 %. BP Readings from Last 3 Encounters:  08/04/19 (!) 152/86  05/05/19 140/80  11/21/18 126/82     Physical Exam Constitutional:      General: She is not in acute distress.    Appearance: Normal appearance. She is well-developed. She is not ill-appearing or toxic-appearing.  HENT:     Head: Normocephalic.     Right Ear: Hearing, ear canal and external ear normal. No tenderness. A middle ear effusion is present. There is no impacted cerumen. No foreign body. Tympanic membrane is not injected, scarred, perforated, erythematous, retracted or bulging.     Left Ear: Hearing, tympanic membrane, ear canal and external ear normal. Tympanic membrane is not erythematous, retracted or bulging.     Nose: No mucosal edema or rhinorrhea.      Right Sinus: No maxillary sinus tenderness or frontal sinus tenderness.     Left Sinus: No maxillary sinus tenderness or frontal sinus tenderness.     Mouth/Throat:     Pharynx: Uvula midline.  Eyes:     General: Lids are normal. Lids are everted, no foreign bodies appreciated.     Conjunctiva/sclera: Conjunctivae normal.     Pupils: Pupils are equal, round, and reactive to light.  Neck:     Musculoskeletal: Normal range of motion and neck supple.     Thyroid: No thyroid mass or thyromegaly.     Vascular: No carotid bruit.     Trachea: Trachea normal.  Cardiovascular:     Rate and Rhythm: Normal rate and regular rhythm.     Pulses: Normal pulses.     Heart sounds: Normal heart sounds, S1 normal and S2 normal. No murmur. No friction rub. No gallop.   Pulmonary:     Effort: Pulmonary effort is normal. No tachypnea or respiratory distress.     Breath sounds: Normal breath sounds. No decreased breath sounds, wheezing, rhonchi or rales.  Abdominal:     General: Bowel sounds are normal.     Palpations: Abdomen is soft.     Tenderness: There is no abdominal tenderness.  Skin:    General: Skin is warm and dry.     Findings: No [place].  Neurological:     Mental Status: She is alert.  Psychiatric:        Mood and Affect: Mood is not anxious or depressed.        Speech: Speech normal.        Behavior: Behavior normal. Behavior is cooperative.        Thought Content: Thought content normal.        Judgment: Judgment normal.      Assessment and Plan   ETD (Eustachian tube dysfunction), right Treat with allergy treatment and  Nasal steroid.  If not improving consider pred taper, then possible ENT referral.     Eliezer Lofts, MD

## 2019-08-07 ENCOUNTER — Other Ambulatory Visit: Payer: Self-pay | Admitting: Family Medicine

## 2019-08-14 ENCOUNTER — Other Ambulatory Visit: Payer: Self-pay

## 2019-08-14 DIAGNOSIS — Z20822 Contact with and (suspected) exposure to covid-19: Secondary | ICD-10-CM

## 2019-08-15 ENCOUNTER — Telehealth: Payer: Self-pay

## 2019-08-15 LAB — NOVEL CORONAVIRUS, NAA: SARS-CoV-2, NAA: NOT DETECTED

## 2019-08-15 NOTE — Telephone Encounter (Signed)
Pt called for covid results- advised to check with her active MyChart account.

## 2019-08-17 ENCOUNTER — Other Ambulatory Visit: Payer: Self-pay | Admitting: Family Medicine

## 2019-08-17 NOTE — Telephone Encounter (Signed)
Last office visit 08/04/2019 ETD.  Last refilled 05/04/2019 for #90 with no refills.  CPE scheduled for 09/15/2019.

## 2019-08-20 ENCOUNTER — Other Ambulatory Visit: Payer: Self-pay | Admitting: Family Medicine

## 2019-08-28 ENCOUNTER — Ambulatory Visit (INDEPENDENT_AMBULATORY_CARE_PROVIDER_SITE_OTHER): Payer: PPO

## 2019-08-28 ENCOUNTER — Ambulatory Visit: Payer: Medicare Other

## 2019-08-28 ENCOUNTER — Telehealth: Payer: Self-pay | Admitting: Family Medicine

## 2019-08-28 DIAGNOSIS — Z0000 Encounter for general adult medical examination without abnormal findings: Secondary | ICD-10-CM

## 2019-08-28 DIAGNOSIS — E039 Hypothyroidism, unspecified: Secondary | ICD-10-CM

## 2019-08-28 DIAGNOSIS — E1121 Type 2 diabetes mellitus with diabetic nephropathy: Secondary | ICD-10-CM

## 2019-08-28 NOTE — Progress Notes (Signed)
Subjective:   April Ross is a 68 y.o. female who presents for Medicare Annual (Subsequent) preventive examination.  Review of Systems: N/A   This visit is being conducted through telemedicine via telephone at the nurse health advisor's home address due to the COVID-19 pandemic. This patient has given me verbal consent via doximity to conduct this visit, patient states they are participating from their home address. Patient and myself are on the telephone call. There is no referral for this visit. Some vital signs may be absent or patient reported.    Patient identification: identified by name, DOB, and current address   Cardiac Risk Factors include: advanced age (>73men, >51 women);diabetes mellitus;hypertension;smoking/ tobacco exposure;Other (see comment), Risk factor comments: hypercholesterolemia     Objective:     Vitals: There were no vitals taken for this visit.  There is no height or weight on file to calculate BMI.  Advanced Directives 08/28/2019 08/20/2018 06/28/2018 10/27/2017 10/27/2017 05/09/2017 05/08/2016  Does Patient Have a Medical Advance Directive? Yes No No No No No No  Type of Paramedic of [place];Living will - - - - - -  Copy of [place] in Chart? No - copy requested - - - - - -  Would patient like information on creating a medical advance directive? - No - Patient declined No - Patient declined No - Patient declined - Yes (MAU/Ambulatory/Procedural Areas - Information given);Yes (Inpatient - patient requests chaplain consult to create a medical advance directive) Yes - Educational materials given    Tobacco Social History   Tobacco Use  Smoking Status Current Every Day Smoker  . Packs/day: 0.25  . Types: Cigarettes  Smokeless Tobacco Never Used     Ready to quit: Not Answered Counseling given: Not Answered   Clinical Intake:  Pre-visit preparation completed: Yes  Pain : No/denies pain     Nutritional  Risks: None Diabetes: Yes CBG done?: No Did pt. bring in CBG monitor from home?: No  How often do you need to have someone help you when you read instructions, pamphlets, or other written materials from your doctor or pharmacy?: 1 - Never What is the last grade level you completed in school?: bachelors  Interpreter Needed?: No  Information entered by :: Cjohnson, LPN  Past Medical History:  Diagnosis Date  . Allergy   . Arthritis   . CRD (chronic renal disease)   . Depression   . Diabetes mellitus without complication ([place])   . GERD (gastroesophageal reflux disease)   . Hyperlipidemia   . Hypertension   . LBBB (left bundle branch block)    a. diagnosed in 10/2017 - echo showed a preserved EF of 55-60% and NST was low-risk showing no evidence of ischemia.   . polyps in colon   . Thyroid disease   . Urinary tract infection    Past Surgical History:  Procedure Laterality Date  . ABDOMINAL HYSTERECTOMY    . APPENDECTOMY     Family History  Problem Relation Age of Onset  . Alzheimer's disease Mother   . Hyperlipidemia Mother   . Hypertension Mother   . Asthma Maternal Grandmother   . Diabetes Maternal Grandfather   . Alcohol abuse Maternal Grandfather   . Hyperlipidemia Maternal Grandfather   . Stroke Maternal Grandfather   . Hypertension Maternal Grandfather   . Diabetes Paternal Grandmother   . Alcohol abuse Maternal Uncle   . Cholecystitis Maternal Uncle   . Arthritis Maternal Uncle  Social History   Socioeconomic History  . Marital status: Widowed    Spouse name: Not on file  . Number of children: Not on file  . Years of education: Not on file  . Highest education level: Not on file  Occupational History  . Not on file  Social Needs  . Financial resource strain: Not hard at all  . Food insecurity    Worry: Never true    Inability: Never true  . Transportation needs    Medical: No    Non-medical: No  Tobacco Use  . Smoking status: Current Every Day  Smoker    Packs/day: 0.25    Types: Cigarettes  . Smokeless tobacco: Never Used  Substance and Sexual Activity  . Alcohol use: Yes    Alcohol/week: 0.0 standard drinks    Comment: occ glass of wine or mix drink  . Drug use: No  . Sexual activity: Not Currently  Lifestyle  . Physical activity    Days per week: 0 days    Minutes per session: 0 min  . Stress: Not at all  Relationships  . Social Herbalist on phone: Not on file    Gets together: Not on file    Attends religious service: Not on file    Active member of club or organization: Not on file    Attends meetings of clubs or organizations: Not on file    Relationship status: Not on file  Other Topics Concern  . Not on file  Social History Narrative  . Not on file    Outpatient Encounter Medications as of 08/28/2019  Medication Sig  . alendronate (FOSAMAX) 70 MG tablet TAKE 1 TABLET BY MOUTH EVERY 7 DAYS WITH A FULL GLASS OF WATER ON AN EMPTY STOMACH  . aspirin EC 81 MG tablet Take 81 mg by mouth daily.  Marland Kitchen buPROPion (WELLBUTRIN XL) 150 MG 24 hr tablet TAKE 1 TABLET BY MOUTH EVERY DAY  . Calcium Carb-Cholecalciferol (CALCIUM-VITAMIN D) 500-200 MG-UNIT tablet Take 1 tablet by mouth daily.  . cetirizine (ZYRTEC) 10 MG tablet Take 10 mg by mouth at bedtime.  Marland Kitchen FLUoxetine (PROZAC) 40 MG capsule TAKE 1 CAPSULE BY MOUTH EVERY DAY  . fluticasone (FLONASE) 50 MCG/ACT nasal spray Place 2 sprays into both nostrils daily.  Marland Kitchen levothyroxine (SYNTHROID, LEVOTHROID) 88 MCG tablet TAKE 1 TABLET BY MOUTH EVERY DAY  . losartan (COZAAR) 100 MG tablet TAKE 1 TABLET BY MOUTH EVERY DAY  . metFORMIN (GLUCOPHAGE) 500 MG tablet TAKE 1 TABLET BY MOUTH 2 TIMES DAILY WITH A MEAL.  Marland Kitchen omeprazole (PRILOSEC) 20 MG capsule TAKE 1 CAPSULE BY MOUTH EVERY DAY  . rosuvastatin (CRESTOR) 20 MG tablet TAKE 1 TABLET BY MOUTH EVERY DAY  . tiZANidine (ZANAFLEX) 4 MG tablet TAKE 1 TABLET BY MOUTH AT BEDTIME AS NEEDED FOR MUSCLE SPASMS.  Marland Kitchen traMADol  (ULTRAM) 50 MG tablet Take 1 tablet (50 mg total) by mouth every 8 (eight) hours as needed.  . traZODone (DESYREL) 100 MG tablet TAKE 1 TABLET BY MOUTH AT BEDTIME   No facility-administered encounter medications on file as of 08/28/2019.     Activities of Daily Living In your present state of health, do you have any difficulty performing the following activities: 08/28/2019  Hearing? Y  Comment swishing in right ear  Vision? N  Difficulty concentrating or making decisions? N  Walking or climbing stairs? N  Dressing or bathing? N  Doing errands, shopping? N  Preparing Food and eating ?  N  Using the Toilet? N  In the past six months, have you accidently leaked urine? N  Do you have problems with loss of bowel control? N  Managing your Medications? N  Managing your Finances? N  Housekeeping or managing your Housekeeping? N  Some recent data might be hidden    Patient Care Team: Jinny Sanders, MD as PCP - General (Family Medicine) Richard Miu, DMD as Consulting Physician (Dentistry)    Assessment:   This is a routine wellness examination for Anginette.  Exercise Activities and Dietary recommendations Current Exercise Habits: The patient does not participate in regular exercise at present, Exercise limited by: None identified  Goals    . Patient Stated     Starting 11/31/2019, I will monitor and reduce intake of simple carbohydrates in an effort to lose 25 lbs by November 2020.    Marland Kitchen Patient Stated     08/28/2019, I will try to continue working on losing some weight        Fall Risk Fall Risk  08/28/2019 08/20/2018 05/09/2017 05/08/2016  Falls in the past year? 1 1 Yes Yes  Comment - 2 falls due to tripping over small dog; denies injury - -  Number falls in past yr: 1 1 1 2  or more  Injury with Fall? 0 0 No No  Risk for fall due to : History of fall(s) - Impaired vision -  Follow up Falls evaluation completed;Falls prevention discussed - Education provided;Falls prevention  discussed -   Is the patient's home free of loose throw rugs in walkways, pet beds, electrical cords, etc?   yes      Grab bars in the bathroom? no      Handrails on the stairs?   yes      Adequate lighting?   yes  Timed Get Up and Go performed: N/A  Depression Screen PHQ 2/9 Scores 08/28/2019 05/05/2019 04/07/2019 09/26/2018  PHQ - 2 Score 0 3 4 2   PHQ- 9 Score 0 9 9 8      Cognitive Function MMSE - Mini Mental State Exam 08/28/2019 08/20/2018 05/09/2017  Orientation to time 5 5 5   Orientation to Place 5 5 5   Registration 3 3 3   Attention/ Calculation 5 0 0  Recall 3 3 3   Language- name 2 objects - 0 0  Language- repeat 1 1 1   Language- follow 3 step command - 3 3  Language- read & follow direction - 0 0  Write a sentence - 0 0  Copy design - 0 0  Total score - 20 20  Mini Cog  Mini-Cog screen was completed. Maximum score is 22. A value of 0 denotes this part of the MMSE was not completed or the patient failed this part of the Mini-Cog screening.      Immunization History  Administered Date(s) Administered  . Fluad Quad(high Dose 65+) 07/17/2019  . Influenza, High Dose Seasonal PF 07/16/2017, 08/01/2018  . Influenza,inj,Quad PF,6+ Mos 06/05/2016  . PPD Test 12/26/2016  . Pneumococcal Conjugate-13 02/25/2014  . Pneumococcal Polysaccharide-23 08/04/2009, 05/08/2016  . Tdap 06/05/2016  . Zoster 08/13/2012    Qualifies for Shingles Vaccine? Yes  Screening Tests Health Maintenance  Topic Date Due  . FOOT EXAM  08/30/2019  . HEMOGLOBIN A1C  10/02/2019  . COLONOSCOPY  03/23/2020  . OPHTHALMOLOGY EXAM  08/24/2020  . MAMMOGRAM  10/29/2020  . TETANUS/TDAP  06/05/2026  . INFLUENZA VACCINE  Completed  . DEXA SCAN  Completed  . Hepatitis  C Screening  Completed  . PNA vac Low Risk Adult  Completed    Cancer Screenings: Lung: Low Dose CT Chest recommended if Age 87-80 years, 30 pack-year currently smoking OR have quit w/in 15years. Patient does not qualify. Breast:  Up  to date on Mammogram? Yes, completed 10/29/2018   Up to date of Bone Density/Dexa? Yes, completed 10/29/2018 Colorectal: completed 03/24/2015  Additional Screenings:  Hepatitis C Screening: 05/03/2016     Plan:    Patient will continue to work on losing some weight   I have personally reviewed and noted the following in the patient's chart:   . Medical and social history . Use of alcohol, tobacco or illicit drugs  . Current medications and supplements . Functional ability and status . Nutritional status . Physical activity . Advanced directives . List of other physicians . Hospitalizations, surgeries, and ER visits in previous 12 months . Vitals . Screenings to include cognitive, depression, and falls . Referrals and appointments  In addition, I have reviewed and discussed with patient certain preventive protocols, quality metrics, and best practice recommendations. A written personalized care plan for preventive services as well as general preventive health recommendations were provided to patient.     Andrez Grime, LPN  [id number]

## 2019-08-28 NOTE — Patient Instructions (Signed)
Ms. April Ross , Thank you for taking time to come for your Medicare Wellness Visit. I appreciate your ongoing commitment to your health goals. Please review the following plan we discussed and let me know if I can assist you in the future.   Screening recommendations/referrals: Colonoscopy: Up to date, completed 03/24/2015 Mammogram: Up to date, completed 10/29/2018 Bone Density: Up to date, completed 10/29/2018 Recommended yearly ophthalmology/optometry visit for glaucoma screening and checkup Recommended yearly dental visit for hygiene and checkup  Vaccinations: Influenza vaccine: Up to date, completed 07/17/2019 Pneumococcal vaccine: Completed series Tdap vaccine: Up to date, completed 06/05/2016 Shingles vaccine: will check with insurance    Advanced directives: Please bring a copy of your POA (Power of [place]) and/or Living Will to your next appointment.   Conditions/risks identified: diabetes, hypertension, hypercholesterolemia  Next appointment: 09/10/2019 @ 11 am    Preventive Care 65 Years and Older, Female Preventive care refers to lifestyle choices and visits with your health care provider that can promote health and wellness. What does preventive care include?  A yearly physical exam. This is also called an annual well check.  Dental exams once or twice a year.  Routine eye exams. Ask your health care provider how often you should have your eyes checked.  Personal lifestyle choices, including:  Daily care of your teeth and gums.  Regular physical activity.  Eating a healthy diet.  Avoiding tobacco and drug use.  Limiting alcohol use.  Practicing safe sex.  Taking low-dose aspirin every day.  Taking vitamin and mineral supplements as recommended by your health care provider. What happens during an annual well check? The services and screenings done by your health care provider during your annual well check will depend on your age, overall health, lifestyle risk  factors, and family history of disease. Counseling  Your health care provider may ask you questions about your:  Alcohol use.  Tobacco use.  Drug use.  Emotional well-being.  Home and relationship well-being.  Sexual activity.  Eating habits.  History of falls.  Memory and ability to understand (cognition).  Work and work Statistician.  Reproductive health. Screening  You may have the following tests or measurements:  Height, weight, and BMI.  Blood pressure.  Lipid and cholesterol levels. These may be checked every 5 years, or more frequently if you are over 45 years old.  Skin check.  Lung cancer screening. You may have this screening every year starting at age 52 if you have a 30-pack-year history of smoking and currently smoke or have quit within the past 15 years.  Fecal occult blood test (FOBT) of the stool. You may have this test every year starting at age 42.  Flexible sigmoidoscopy or colonoscopy. You may have a sigmoidoscopy every 5 years or a colonoscopy every 10 years starting at age 57.  Hepatitis C blood test.  Hepatitis B blood test.  Sexually transmitted disease (STD) testing.  Diabetes screening. This is done by checking your blood sugar (glucose) after you have not eaten for a while (fasting). You may have this done every 1-3 years.  Bone density scan. This is done to screen for osteoporosis. You may have this done starting at age 91.  Mammogram. This may be done every 1-2 years. Talk to your health care provider about how often you should have regular mammograms. Talk with your health care provider about your test results, treatment options, and if necessary, the need for more tests. Vaccines  Your health care provider may recommend  certain vaccines, such as:  Influenza vaccine. This is recommended every year.  Tetanus, diphtheria, and acellular pertussis (Tdap, Td) vaccine. You may need a Td booster every 10 years.  Zoster vaccine. You  may need this after age 19.  Pneumococcal 13-valent conjugate (PCV13) vaccine. One dose is recommended after age 54.  Pneumococcal polysaccharide (PPSV23) vaccine. One dose is recommended after age 9. Talk to your health care provider about which screenings and vaccines you need and how often you need them. This information is not intended to replace advice given to you by your health care provider. Make sure you discuss any questions you have with your health care provider. Document Released: 10/21/2015 Document Revised: 06/13/2016 Document Reviewed: 07/26/2015 Elsevier Interactive Patient Education  2017 [place] Prevention in the Home Falls can cause injuries. They can happen to people of all ages. There are many things you can do to make your home safe and to help prevent falls. What can I do on the outside of my home?  Regularly fix the edges of walkways and driveways and fix any cracks.  Remove anything that might make you trip as you walk through a door, such as a raised step or threshold.  Trim any bushes or trees on the path to your home.  Use bright outdoor lighting.  Clear any walking paths of anything that might make someone trip, such as rocks or tools.  Regularly check to see if handrails are loose or broken. Make sure that both sides of any steps have handrails.  Any raised decks and porches should have guardrails on the edges.  Have any leaves, snow, or ice cleared regularly.  Use sand or salt on walking paths during winter.  Clean up any spills in your garage right away. This includes oil or grease spills. What can I do in the bathroom?  Use night lights.  Install grab bars by the toilet and in the tub and shower. Do not use towel bars as grab bars.  Use non-skid mats or decals in the tub or shower.  If you need to sit down in the shower, use a plastic, non-slip stool.  Keep the floor dry. Clean up any water that spills on the floor as soon as  it happens.  Remove soap buildup in the tub or shower regularly.  Attach bath mats securely with double-sided non-slip rug tape.  Do not have throw rugs and other things on the floor that can make you trip. What can I do in the bedroom?  Use night lights.  Make sure that you have a light by your bed that is easy to reach.  Do not use any sheets or blankets that are too big for your bed. They should not hang down onto the floor.  Have a firm chair that has side arms. You can use this for support while you get dressed.  Do not have throw rugs and other things on the floor that can make you trip. What can I do in the kitchen?  Clean up any spills right away.  Avoid walking on wet floors.  Keep items that you use a lot in easy-to-reach places.  If you need to reach something above you, use a strong step stool that has a grab bar.  Keep electrical cords out of the way.  Do not use floor polish or wax that makes floors slippery. If you must use wax, use non-skid floor wax.  Do not have throw rugs and other  things on the floor that can make you trip. What can I do with my stairs?  Do not leave any items on the stairs.  Make sure that there are handrails on both sides of the stairs and use them. Fix handrails that are broken or loose. Make sure that handrails are as long as the stairways.  Check any carpeting to make sure that it is firmly attached to the stairs. Fix any carpet that is loose or worn.  Avoid having throw rugs at the top or bottom of the stairs. If you do have throw rugs, attach them to the floor with carpet tape.  Make sure that you have a light switch at the top of the stairs and the bottom of the stairs. If you do not have them, ask someone to add them for you. What else can I do to help prevent falls?  Wear shoes that:  Do not have high heels.  Have rubber bottoms.  Are comfortable and fit you well.  Are closed at the toe. Do not wear sandals.  If  you use a stepladder:  Make sure that it is fully opened. Do not climb a closed stepladder.  Make sure that both sides of the stepladder are locked into place.  Ask someone to hold it for you, if possible.  Clearly mark and make sure that you can see:  Any grab bars or handrails.  First and last steps.  Where the edge of each step is.  Use tools that help you move around (mobility aids) if they are needed. These include:  Canes.  Walkers.  Scooters.  Crutches.  Turn on the lights when you go into a dark area. Replace any light bulbs as soon as they burn out.  Set up your furniture so you have a clear path. Avoid moving your furniture around.  If any of your floors are uneven, fix them.  If there are any pets around you, be aware of where they are.  Review your medicines with your doctor. Some medicines can make you feel dizzy. This can increase your chance of falling. Ask your doctor what other things that you can do to help prevent falls. This information is not intended to replace advice given to you by your health care provider. Make sure you discuss any questions you have with your health care provider. Document Released: 07/21/2009 Document Revised: 03/01/2016 Document Reviewed: 10/29/2014 Elsevier Interactive Patient Education  2017 [employer].

## 2019-08-28 NOTE — Telephone Encounter (Signed)
-----   Message from Ellamae Sia sent at 08/24/2019 10:17 AM EST ----- Regarding: lab orders for Monday, 11.23.20 Patient is scheduled for CPX labs, please order future labs, Thanks , Karna Christmas

## 2019-08-28 NOTE — Progress Notes (Signed)
PCP notes:  Health Maintenance: Will check with insurance about shingrix   Abnormal Screenings: none   Patient concerns: Swishing sound in right ear    Nurse concerns: none   Next PCP appt.: 09/10/2019 @ 11 am

## 2019-08-31 ENCOUNTER — Other Ambulatory Visit (INDEPENDENT_AMBULATORY_CARE_PROVIDER_SITE_OTHER): Payer: PPO

## 2019-08-31 ENCOUNTER — Other Ambulatory Visit: Payer: Self-pay

## 2019-08-31 DIAGNOSIS — E1121 Type 2 diabetes mellitus with diabetic nephropathy: Secondary | ICD-10-CM

## 2019-08-31 LAB — COMPREHENSIVE METABOLIC PANEL
ALT: 34 U/L (ref 0–35)
AST: 40 U/L — ABNORMAL HIGH (ref 0–37)
Albumin: 4.1 g/dL (ref 3.5–5.2)
Alkaline Phosphatase: 50 U/L (ref 39–117)
BUN: 19 mg/dL (ref 6–23)
CO2: 28 mEq/L (ref 19–32)
Calcium: 9.3 mg/dL (ref 8.4–10.5)
Chloride: 103 mEq/L (ref 96–112)
Creatinine, Ser: 1.39 mg/dL — ABNORMAL HIGH (ref 0.40–1.20)
GFR: 37.43 mL/min — ABNORMAL LOW (ref >60.00)
Glucose, Bld: 153 mg/dL — ABNORMAL HIGH (ref 70–99)
Potassium: 3.9 mEq/L (ref 3.5–5.1)
Sodium: 139 mEq/L (ref 135–145)
Total Bilirubin: 0.5 mg/dL (ref 0.2–1.2)
Total Protein: 6.5 g/dL (ref 6.0–8.3)

## 2019-08-31 LAB — LDL CHOLESTEROL, DIRECT: Direct LDL: 96 mg/dL

## 2019-08-31 LAB — HEMOGLOBIN A1C: Hgb A1c MFr Bld: 6.9 % — ABNORMAL HIGH (ref 4.6–6.5)

## 2019-08-31 LAB — LIPID PANEL
Cholesterol: 165 mg/dL (ref 0–200)
HDL: 35.2 mg/dL — ABNORMAL LOW (ref >39.00)
NonHDL: 130.1
Total CHOL/HDL Ratio: 5
Triglycerides: 251 mg/dL — ABNORMAL HIGH (ref 0.0–149.0)
VLDL: 50.2 mg/dL — ABNORMAL HIGH (ref 0.0–40.0)

## 2019-08-31 NOTE — Progress Notes (Signed)
No critical labs need to be addressed urgently. We will discuss labs in detail at upcoming office visit.   

## 2019-09-01 ENCOUNTER — Encounter: Payer: Medicare Other | Admitting: Family Medicine

## 2019-09-10 ENCOUNTER — Encounter: Payer: Self-pay | Admitting: Family Medicine

## 2019-09-10 ENCOUNTER — Ambulatory Visit (INDEPENDENT_AMBULATORY_CARE_PROVIDER_SITE_OTHER): Payer: PPO | Admitting: Family Medicine

## 2019-09-10 DIAGNOSIS — I10 Essential (primary) hypertension: Secondary | ICD-10-CM | POA: Diagnosis not present

## 2019-09-10 DIAGNOSIS — F321 Major depressive disorder, single episode, moderate: Secondary | ICD-10-CM | POA: Diagnosis not present

## 2019-09-10 DIAGNOSIS — N1831 Chronic kidney disease, stage 3a: Secondary | ICD-10-CM | POA: Diagnosis not present

## 2019-09-10 DIAGNOSIS — E7800 Pure hypercholesterolemia, unspecified: Secondary | ICD-10-CM

## 2019-09-10 DIAGNOSIS — E1121 Type 2 diabetes mellitus with diabetic nephropathy: Secondary | ICD-10-CM | POA: Diagnosis not present

## 2019-09-10 DIAGNOSIS — E039 Hypothyroidism, unspecified: Secondary | ICD-10-CM | POA: Diagnosis not present

## 2019-09-10 DIAGNOSIS — Z0000 Encounter for general adult medical examination without abnormal findings: Secondary | ICD-10-CM | POA: Diagnosis not present

## 2019-09-10 DIAGNOSIS — F5104 Psychophysiologic insomnia: Secondary | ICD-10-CM | POA: Diagnosis not present

## 2019-09-10 DIAGNOSIS — H6981 Other specified disorders of Eustachian tube, right ear: Secondary | ICD-10-CM

## 2019-09-10 MED ORDER — PREDNISONE 20 MG PO TABS: ORAL_TABLET | ORAL | 0 refills | Status: DC

## 2019-09-10 NOTE — Assessment & Plan Note (Signed)
Well controlled. Continue current medication. Encouraged exercise, weight loss, healthy eating habits.  

## 2019-09-10 NOTE — Assessment & Plan Note (Signed)
Good control on metformin. 

## 2019-09-10 NOTE — Assessment & Plan Note (Signed)
Stable symptoms control.. re-eval at next OV

## 2019-09-10 NOTE — Assessment & Plan Note (Signed)
Stable contro on trazodone.

## 2019-09-10 NOTE — Assessment & Plan Note (Signed)
Well controlled. Continue current medication.  

## 2019-09-10 NOTE — Assessment & Plan Note (Signed)
Stable control. 

## 2019-09-10 NOTE — Progress Notes (Signed)
VIRTUAL VISIT Due to national recommendations of social distancing due to [place] 19, a virtual visit is felt to be most appropriate for this patient at this time.   I connected with the patient on 09/10/19 at 11:00 AM EST by virtual telehealth platform and verified that I am speaking with the correct person using two identifiers.   I discussed the limitations, risks, security and privacy concerns of performing an evaluation and management service by  virtual telehealth platform and the availability of in person appointments. I also discussed with the patient that there may be a patient responsible charge related to this service. The patient expressed understanding and agreed to proceed.  Patient location: Home Provider Location: [facility] Participants: Eliezer Lofts and Laymond Purser [place]   Chief Complaint  Patient presents with  . Annual Exam    Part 2    History of Present Illness: The patient presents for  chronic health problems. He/She also has the following acute concerns today: swishing sound in right ear, off balance at times ( none in last month) occ headache or presyncope, no ear pain, hx of ETD, no pulse sound  Ongooing x 1 month.. possible improvement with flonase, ceterizine  The patient saw a LPN or RN for medicare wellness visit.  Prevention and wellness was reviewed in detail. Note reviewed and important notes copied below.  Health Maintenance: Will check with insurance about shingrix Abnormal Screenings: none Patient concerns: Swishing sound in right ear   09/10/19  Diabetes:   Good control on metformin. Lab Results  Component Value Date   HGBA1C 6.9 (H) 08/31/2019  Using medications without difficulties: Hypoglycemic episodes:? Hyperglycemic episodes:? Feet problems:no ulcers Blood Sugars averaging: not checking.. follow on prednsione eye exam within last year: 11/ 20/2020  CKD stable  Elevated Cholesterol:  At goal on  crestor Lab Results   Component Value Date   CHOL 165 08/31/2019   HDL 35.20 (L) 08/31/2019   LDLCALC 61 10/28/2017   LDLDIRECT 96.0 08/31/2019   TRIG 251.0 (H) 08/31/2019   CHOLHDL 5 08/31/2019  Using medications without problems:none Muscle aches: none Diet compliance: healthy low carb diet Exercise: walking dogs Other complaints:  Hypertension:   not  able to check today BP Readings from Last 3 Encounters:  08/04/19 (!) 152/86  05/05/19 140/80  11/21/18 126/82  Using medication without problems or lightheadedness: none Chest pain with exertion:none Edema:none Short of breath:none Average home BPs: Other issues:   Hypothyroid  Lab Results  Component Value Date   TSH 0.43 08/20/2018   MDD :  Well controlled on bupropion and trazodone for sleep.   Clinical Support from 08/28/2019 in [place] [facility] Total Score  0      COVID 19 screen No recent travel or known exposure to [place]  The importance of social distancing was discussed today.   Review of Systems  Constitutional: Negative for chills and fever.  HENT: Negative for congestion and ear pain.   Eyes: Negative for pain and redness.  Respiratory: Negative for cough and shortness of breath.   Cardiovascular: Negative for chest pain, palpitations and leg swelling.  Gastrointestinal: Negative for abdominal pain, blood in stool, constipation, diarrhea, nausea and vomiting.  Genitourinary: Negative for dysuria.  Musculoskeletal: Negative for falls and myalgias.  Skin: Negative for [place].  Neurological: Negative for dizziness.  Psychiatric/Behavioral: Negative for depression. The patient is not nervous/anxious.       Past Medical History:  Diagnosis Date  . Allergy   .  Arthritis   . CRD (chronic renal disease)   . Depression   . Diabetes mellitus without complication ([place])   . GERD (gastroesophageal reflux disease)   . Hyperlipidemia   . Hypertension   . LBBB (left bundle branch block)    a. diagnosed  in 10/2017 - echo showed a preserved EF of 55-60% and NST was low-risk showing no evidence of ischemia.   . polyps in colon   . Thyroid disease   . Urinary tract infection     reports that she has been smoking cigarettes. She has been smoking about 0.25 packs per day. She has never used smokeless tobacco. She reports current alcohol use. She reports that she does not use drugs.   Current Outpatient Medications:  .  alendronate (FOSAMAX) 70 MG tablet, TAKE 1 TABLET BY MOUTH EVERY 7 DAYS WITH A FULL GLASS OF WATER ON AN EMPTY STOMACH, Disp: 12 tablet, Rfl: 0 .  aspirin EC 81 MG tablet, Take 81 mg by mouth daily., Disp: , Rfl:  .  buPROPion (WELLBUTRIN XL) 150 MG 24 hr tablet, TAKE 1 TABLET BY MOUTH EVERY DAY, Disp: 90 tablet, Rfl: 1 .  Calcium Carb-Cholecalciferol (CALCIUM-VITAMIN D) 500-200 MG-UNIT tablet, Take 1 tablet by mouth daily., Disp: , Rfl:  .  cetirizine (ZYRTEC) 10 MG tablet, Take 10 mg by mouth at bedtime., Disp: , Rfl:  .  FLUoxetine (PROZAC) 40 MG capsule, TAKE 1 CAPSULE BY MOUTH EVERY DAY, Disp: 90 capsule, Rfl: 1 .  fluticasone (FLONASE) 50 MCG/ACT nasal spray, Place 2 sprays into both nostrils daily., Disp: 16 g, Rfl: 5 .  levothyroxine (SYNTHROID, LEVOTHROID) 88 MCG tablet, TAKE 1 TABLET BY MOUTH EVERY DAY, Disp: 90 tablet, Rfl: 2 .  losartan (COZAAR) 100 MG tablet, TAKE 1 TABLET BY MOUTH EVERY DAY, Disp: 90 tablet, Rfl: 1 .  metFORMIN (GLUCOPHAGE) 500 MG tablet, TAKE 1 TABLET BY MOUTH 2 TIMES DAILY WITH A MEAL., Disp: 180 tablet, Rfl: 1 .  omeprazole (PRILOSEC) 20 MG capsule, TAKE 1 CAPSULE BY MOUTH EVERY DAY, Disp: 90 capsule, Rfl: 1 .  rosuvastatin (CRESTOR) 20 MG tablet, TAKE 1 TABLET BY MOUTH EVERY DAY, Disp: 90 tablet, Rfl: 1 .  tiZANidine (ZANAFLEX) 4 MG tablet, TAKE 1 TABLET BY MOUTH AT BEDTIME AS NEEDED FOR MUSCLE SPASMS., Disp: 90 tablet, Rfl: 0 .  traZODone (DESYREL) 100 MG tablet, TAKE 1 TABLET BY MOUTH AT BEDTIME, Disp: 90 tablet, Rfl: 2    Observations/Objective: There were no vitals taken for this visit.  Physical Exam   Physical Exam Constitutional:      General: The patient is not in acute distress. Pulmonary:     Effort: Pulmonary effort is normal. No respiratory distress.  Neurological:     Mental Status: The patient is alert and oriented to person, place, and time.  Psychiatric:        Mood and Affect: Mood normal.        Behavior: Behavior normal.   Assessment and Plan Chronic insomnia  Stable contro on trazodone.  CKD (chronic kidney disease) stage 3, GFR 30-59 ml/min (HCC)  Stable control.  Essential hypertension, benign Well controlled. Continue current medication.   Moderate major depression, single episode ([place]) Well controlled. Continue current medication.   ETD (Eustachian tube dysfunction), right Minimal improvement.. trial of prednisone. If not better consider referral to ENT or re-eval in person.  Pure hypercholesterolemia Well controlled. Continue current medication. Encouraged exercise, weight loss, healthy eating habits.   Type 2 diabetes mellitus with  nephropathy ([place]) Good control on metformin.  Hypothyroidism Stable symptoms control.. re-eval at next [place] with PNA and flu vaccine S/P hysterectomy.  Plans on setting up mammogram. Last mamog 10/2018   05/2019 DEXa..  Improved on fosamax Will repeat 2022, plan to stop fosamax then   I discussed the assessment and treatment plan with the patient. The patient was provided an opportunity to ask questions and all were answered. The patient agreed with the plan and demonstrated an understanding of the instructions.   The patient was advised to call back or seek an in-person evaluation if the symptoms worsen or if the condition fails to improve as anticipated.     Eliezer Lofts, MD

## 2019-09-10 NOTE — Assessment & Plan Note (Signed)
Minimal improvement.. trial of prednisone. If not better consider referral to ENT or re-eval in person.

## 2019-09-10 NOTE — Patient Instructions (Addendum)
Complete the course of prednisone. If ear pressure is not improving.. call for referral to ENT.  Follow BP at home.. your goal < 140/90.  Call to set up mammogram on your own.

## 2019-09-15 ENCOUNTER — Encounter: Payer: PPO | Admitting: Family Medicine

## 2019-09-19 ENCOUNTER — Other Ambulatory Visit: Payer: Self-pay | Admitting: Family Medicine

## 2019-09-21 NOTE — Telephone Encounter (Signed)
Discussed at last OV to check at next lab draw. Will refill and check then. Per pt very stable and asymptomatic.

## 2019-09-21 NOTE — Telephone Encounter (Signed)
Last office visit 09/10/2019 for CPE.  Last thyroid labs 08/20/2018.  Refill?

## 2019-09-22 ENCOUNTER — Other Ambulatory Visit: Payer: Self-pay | Admitting: Family Medicine

## 2019-09-23 ENCOUNTER — Other Ambulatory Visit: Payer: Self-pay | Admitting: Family Medicine

## 2019-09-25 ENCOUNTER — Other Ambulatory Visit: Payer: Self-pay | Admitting: Family Medicine

## 2019-10-28 ENCOUNTER — Other Ambulatory Visit: Payer: Self-pay | Admitting: Family Medicine

## 2019-11-02 ENCOUNTER — Other Ambulatory Visit: Payer: Self-pay | Admitting: Family Medicine

## 2019-11-02 DIAGNOSIS — H6981 Other specified disorders of Eustachian tube, right ear: Secondary | ICD-10-CM

## 2019-11-30 ENCOUNTER — Emergency Department (HOSPITAL_COMMUNITY): Payer: PPO

## 2019-11-30 ENCOUNTER — Other Ambulatory Visit: Payer: Self-pay

## 2019-11-30 ENCOUNTER — Emergency Department (HOSPITAL_COMMUNITY)
Admission: EM | Admit: 2019-11-30 | Discharge: 2019-11-30 | Disposition: A | Discharge status: Home / Self Care | Payer: PPO | Attending: Emergency Medicine | Admitting: Emergency Medicine

## 2019-11-30 ENCOUNTER — Encounter (HOSPITAL_COMMUNITY): Payer: Self-pay | Admitting: Emergency Medicine

## 2019-11-30 DIAGNOSIS — R9089 Other abnormal findings on diagnostic imaging of central nervous system: Secondary | ICD-10-CM | POA: Diagnosis not present

## 2019-11-30 DIAGNOSIS — N183 Chronic kidney disease, stage 3 unspecified: Secondary | ICD-10-CM | POA: Insufficient documentation

## 2019-11-30 DIAGNOSIS — I6782 Cerebral ischemia: Secondary | ICD-10-CM | POA: Diagnosis not present

## 2019-11-30 DIAGNOSIS — Z7984 Long term (current) use of oral hypoglycemic drugs: Secondary | ICD-10-CM | POA: Diagnosis not present

## 2019-11-30 DIAGNOSIS — I129 Hypertensive chronic kidney disease with stage 1 through stage 4 chronic kidney disease, or unspecified chronic kidney disease: Secondary | ICD-10-CM | POA: Diagnosis not present

## 2019-11-30 DIAGNOSIS — R270 Ataxia, unspecified: Secondary | ICD-10-CM | POA: Diagnosis not present

## 2019-11-30 DIAGNOSIS — R42 Dizziness and giddiness: Secondary | ICD-10-CM | POA: Insufficient documentation

## 2019-11-30 DIAGNOSIS — R2681 Unsteadiness on feet: Secondary | ICD-10-CM | POA: Diagnosis not present

## 2019-11-30 DIAGNOSIS — F17210 Nicotine dependence, cigarettes, uncomplicated: Secondary | ICD-10-CM | POA: Diagnosis not present

## 2019-11-30 DIAGNOSIS — E1122 Type 2 diabetes mellitus with diabetic chronic kidney disease: Secondary | ICD-10-CM | POA: Insufficient documentation

## 2019-11-30 DIAGNOSIS — I6523 Occlusion and stenosis of bilateral carotid arteries: Secondary | ICD-10-CM | POA: Diagnosis not present

## 2019-11-30 DIAGNOSIS — G3189 Other specified degenerative diseases of nervous system: Secondary | ICD-10-CM | POA: Diagnosis not present

## 2019-11-30 DIAGNOSIS — E039 Hypothyroidism, unspecified: Secondary | ICD-10-CM | POA: Insufficient documentation

## 2019-11-30 DIAGNOSIS — Z7982 Long term (current) use of aspirin: Secondary | ICD-10-CM | POA: Diagnosis not present

## 2019-11-30 DIAGNOSIS — S79912A Unspecified injury of left hip, initial encounter: Secondary | ICD-10-CM | POA: Diagnosis not present

## 2019-11-30 DIAGNOSIS — J3489 Other specified disorders of nose and nasal sinuses: Secondary | ICD-10-CM | POA: Diagnosis not present

## 2019-11-30 DIAGNOSIS — M25552 Pain in left hip: Secondary | ICD-10-CM | POA: Insufficient documentation

## 2019-11-30 DIAGNOSIS — Z79899 Other long term (current) drug therapy: Secondary | ICD-10-CM | POA: Diagnosis not present

## 2019-11-30 LAB — COMPREHENSIVE METABOLIC PANEL
ALT: 37 U/L (ref 0–44)
AST: 42 U/L — ABNORMAL HIGH (ref 15–41)
Albumin: 4.2 g/dL (ref 3.5–5.0)
Alkaline Phosphatase: 46 U/L (ref 38–126)
Anion gap: 13 (ref 5–15)
BUN: 18 mg/dL (ref 8–23)
CO2: 23 mmol/L (ref 22–32)
Calcium: 9.6 mg/dL (ref 8.9–10.3)
Chloride: 101 mmol/L (ref 98–111)
Creatinine, Ser: 1.47 mg/dL — ABNORMAL HIGH (ref 0.44–1.00)
GFR calc Af Amer: 41 mL/min — ABNORMAL LOW (ref >60)
GFR calc non Af Amer: 36 mL/min — ABNORMAL LOW (ref >60)
Glucose, Bld: 127 mg/dL — ABNORMAL HIGH (ref 70–99)
Potassium: 4.5 mmol/L (ref 3.5–5.1)
Sodium: 137 mmol/L (ref 135–145)
Total Bilirubin: 0.6 mg/dL (ref 0.3–1.2)
Total Protein: 6.6 g/dL (ref 6.5–8.1)

## 2019-11-30 LAB — DIFFERENTIAL
Abs Immature Granulocytes: 0.01 10*3/uL (ref 0.00–0.07)
Basophils Absolute: 0.1 10*3/uL (ref 0.0–0.1)
Basophils Relative: 1 %
Eosinophils Absolute: 0.5 10*3/uL (ref 0.0–0.5)
Eosinophils Relative: 8 %
Immature Granulocytes: 0 %
Lymphocytes Relative: 25 %
Lymphs Abs: 1.5 10*3/uL (ref 0.7–4.0)
Monocytes Absolute: 0.7 10*3/uL (ref 0.1–1.0)
Monocytes Relative: 12 %
Neutro Abs: 3.3 10*3/uL (ref 1.7–7.7)
Neutrophils Relative %: 54 %

## 2019-11-30 LAB — I-STAT CHEM 8, ED
BUN: 21 mg/dL (ref 8–23)
Calcium, Ion: 0.99 mmol/L — ABNORMAL LOW (ref 1.15–1.40)
Chloride: 106 mmol/L (ref 98–111)
Creatinine, Ser: 1.4 mg/dL — ABNORMAL HIGH (ref 0.44–1.00)
Glucose, Bld: 125 mg/dL — ABNORMAL HIGH (ref 70–99)
HCT: 35 % — ABNORMAL LOW (ref 36.0–46.0)
Hemoglobin: 11.9 g/dL — ABNORMAL LOW (ref 12.0–15.0)
Potassium: 4.3 mmol/L (ref 3.5–5.1)
Sodium: 136 mmol/L (ref 135–145)
TCO2: 25 mmol/L (ref 22–32)

## 2019-11-30 LAB — CBC
HCT: 38.3 % (ref 36.0–46.0)
Hemoglobin: 12.3 g/dL (ref 12.0–15.0)
MCH: 27.7 pg (ref 26.0–34.0)
MCHC: 32.1 g/dL (ref 30.0–36.0)
MCV: 86.3 fL (ref 80.0–100.0)
Platelets: 254 10*3/uL (ref 150–400)
RBC: 4.44 MIL/uL (ref 3.87–5.11)
RDW: 13.4 % (ref 11.5–15.5)
WBC: 6.1 10*3/uL (ref 4.0–10.5)
nRBC: 0 % (ref 0.0–0.2)

## 2019-11-30 LAB — PROTIME-INR
INR: 1 (ref 0.8–1.2)
Prothrombin Time: 13.4 seconds (ref 11.4–15.2)

## 2019-11-30 LAB — CBG MONITORING, ED: Glucose-Capillary: 116 mg/dL — ABNORMAL HIGH (ref 70–99)

## 2019-11-30 LAB — APTT: aPTT: 27 seconds (ref 24–36)

## 2019-11-30 IMAGING — CT CT HEAD W/O CM
4 series · 16 of 47 positions shown, 18 images · non-contrast
Comparison: No priors.

CLINICAL DATA: 68-year-old female with history of vertigo. Possible
stroke.

EXAM:
CT HEAD WITHOUT CONTRAST
TECHNIQUE: Contiguous axial images were obtained from the base of the skull
through the vertex without intravenous contrast.

[Series 3: head wo · axial · 0.39mm/px · z∈[-109,-9]mm · 7 of 28 slices shown, 9 images]
[im 4/28  brain]
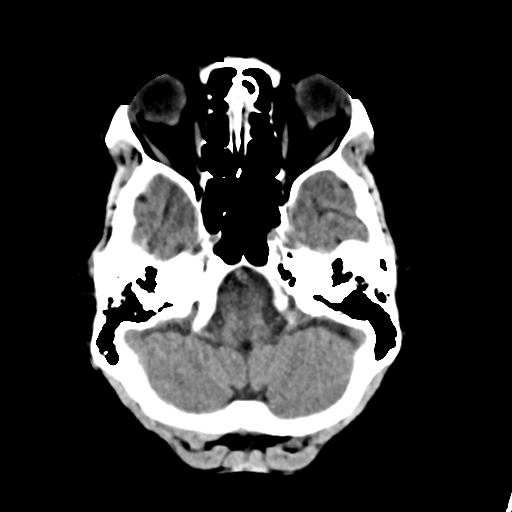
[im 4/28  bone]
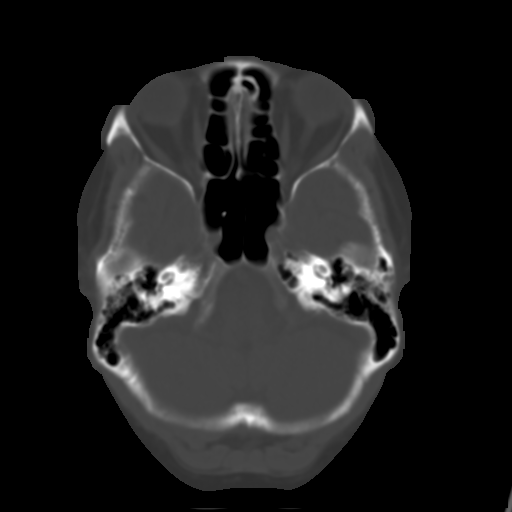
[im 7/28  brain]
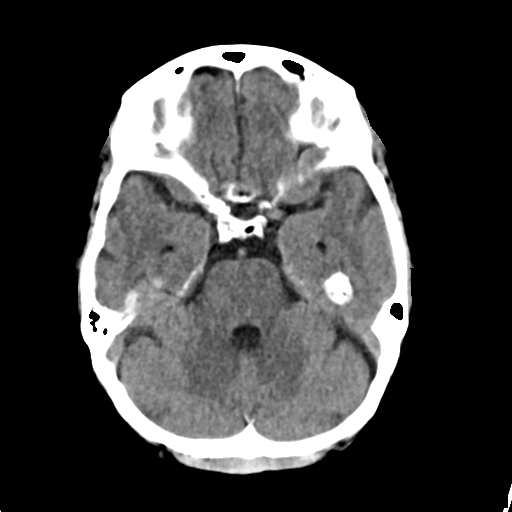
[im 11/28  brain]
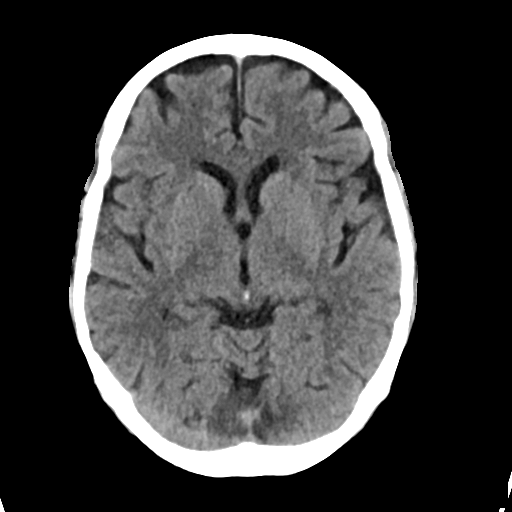
[im 14/28  brain]
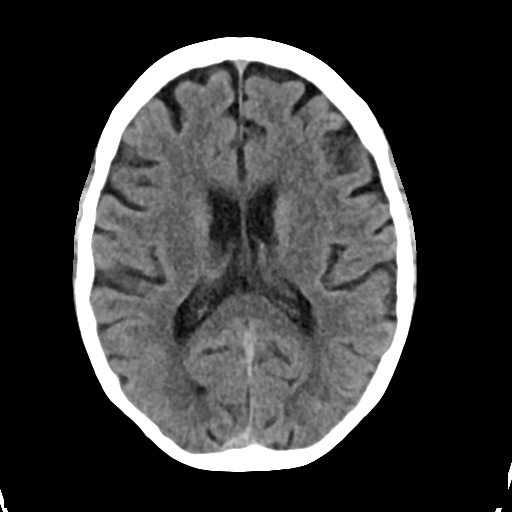
[im 17/28  brain]
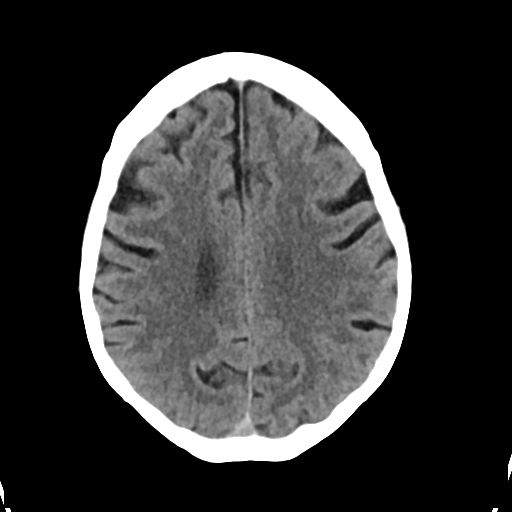
[im 17/28  bone]
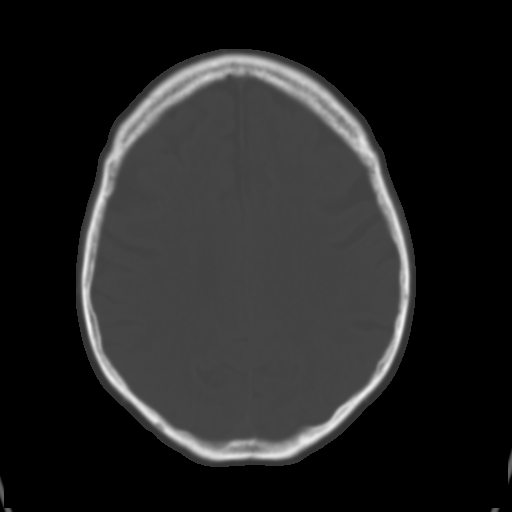
[im 21/28  brain]
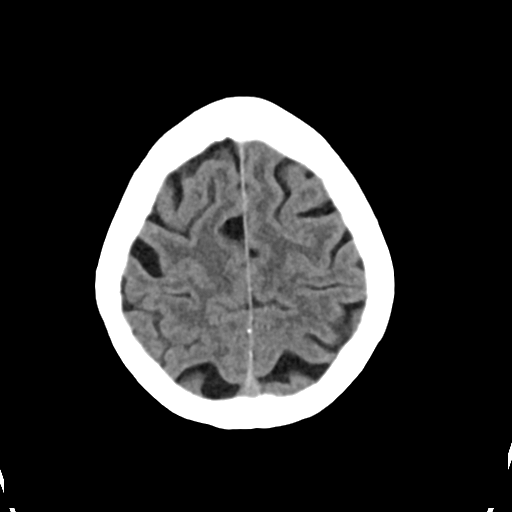
[im 24/28  brain]
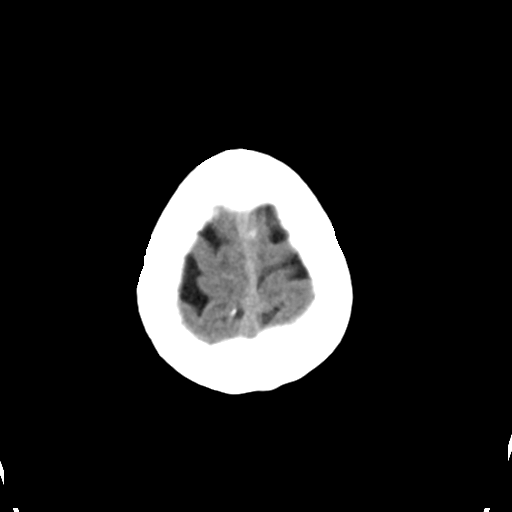

[Series 4: head bone · axial · 0.39mm/px · z∈[-112,-84]mm · 3 of 70 slices shown]
[im 7/70  bone]
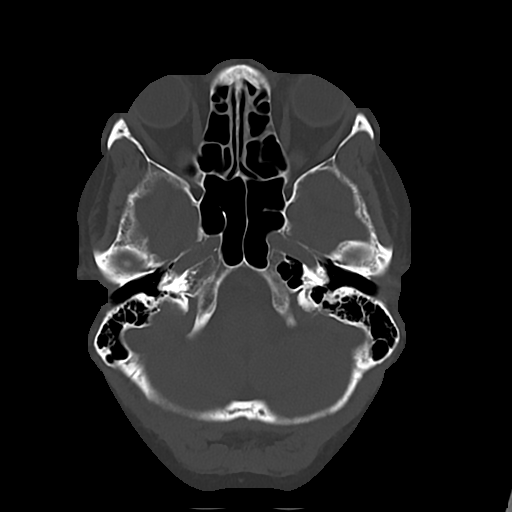
[im 14/70  bone]
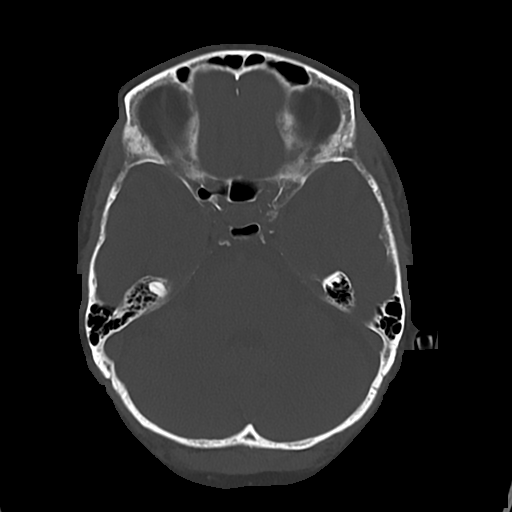
[im 21/70  bone]
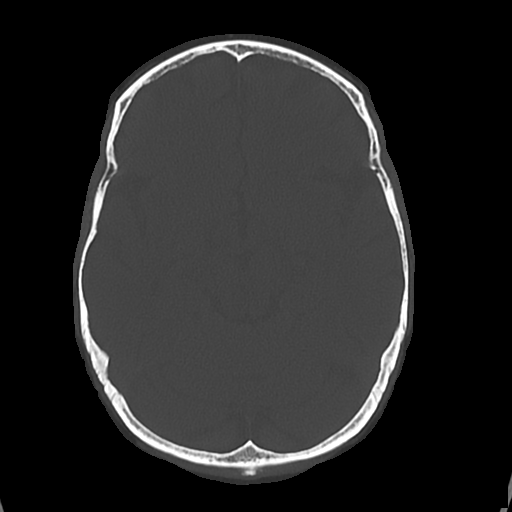

[Series 5: cor soft · coronal · 0.29mm/px · 3 of 60 slices shown]
[im 20/60  brain]
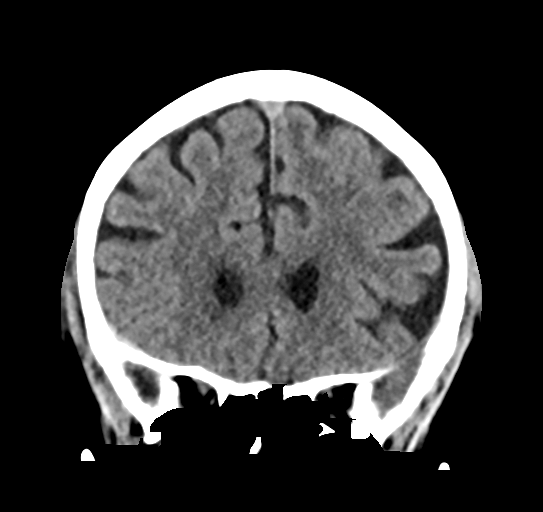
[im 27/60  brain]
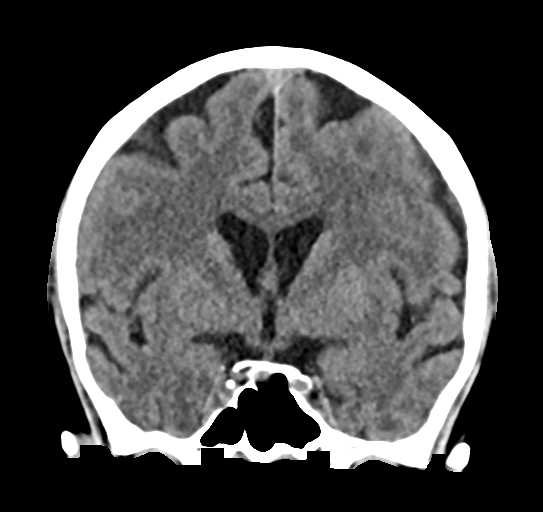
[im 33/60  brain]
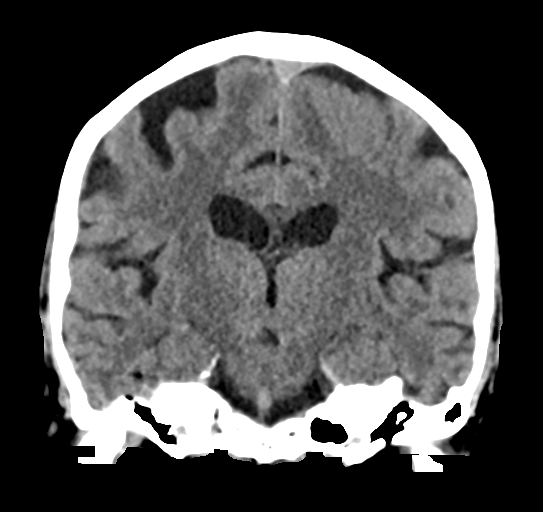

[Series 6: sag soft · sagittal · 0.29mm/px · 3 of 54 slices shown]
[im 18/54  brain]
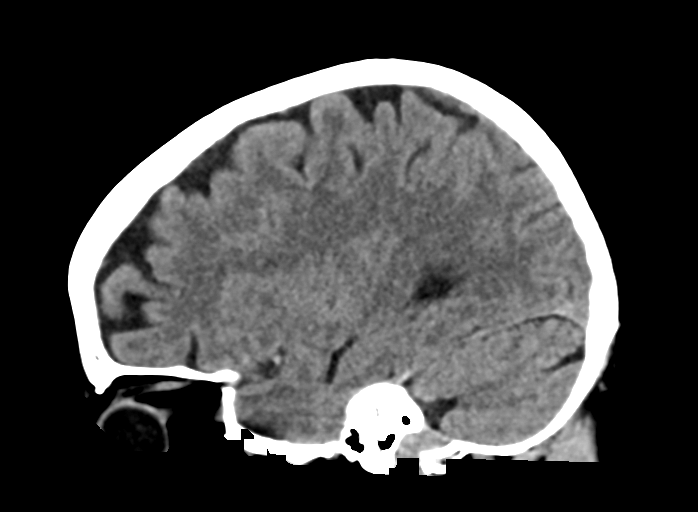
[im 27/54  brain]
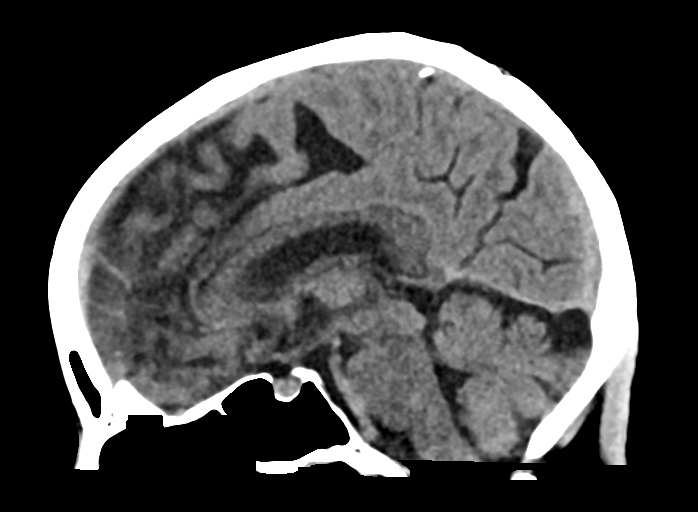
[im 36/54  brain]
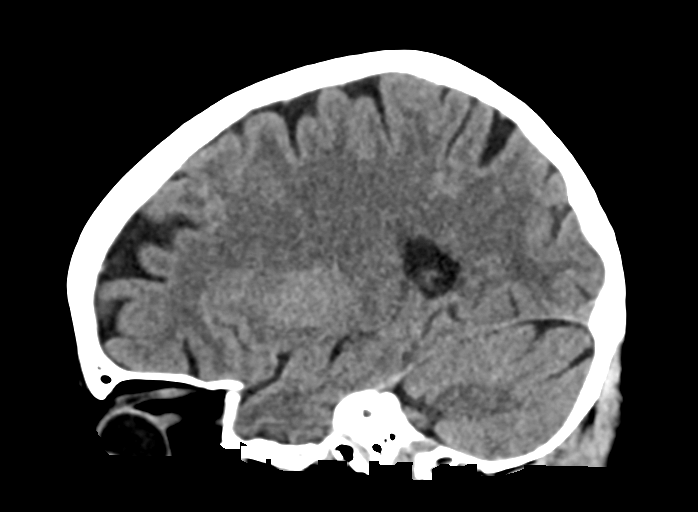

[16 of 47 positions shown; findings below may reference images not displayed]

FINDINGS: Brain: Mild cerebral atrophy. Patchy areas of decreased attenuation
are noted throughout the deep and periventricular white matter of
the cerebral hemispheres bilaterally, compatible with mild chronic
microvascular ischemic disease. No evidence of acute infarction,
hemorrhage, hydrocephalus, extra-axial collection or mass
lesion/mass effect.

Vascular: No hyperdense vessel or unexpected calcification.

Skull: Normal. Negative for fracture or focal lesion.

Sinuses/Orbits: No acute finding.

Other: None.
IMPRESSION: 1. No acute intracranial abnormalities.
2. Mild cerebral atrophy with mild chronic microvascular ischemic
changes in the cerebral white matter, as above.

## 2019-11-30 IMAGING — MR MR HEAD W/O CM
7 of 12 series · 27 of 48 positions shown · non-contrast
Comparison: CT head [DATE]

CLINICAL DATA: Stroke.  Vertigo.  Ataxia.

EXAM:
MRI HEAD WITHOUT CONTRAST
TECHNIQUE: Multiplanar, multiecho pulse sequences of the brain and surrounding
structures were obtained without intravenous contrast.

[Series 2: DWI · axial · 3.0mm · 0.94mm/px · z∈[-96,+51]mm · 6 of 100 slices shown (1 of 3)]
[im 1/100]
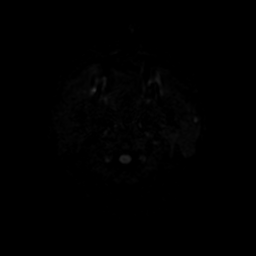
[im 20/100]
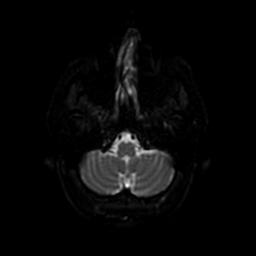
[im 40/100]
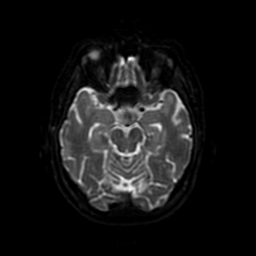
[im 60/100]
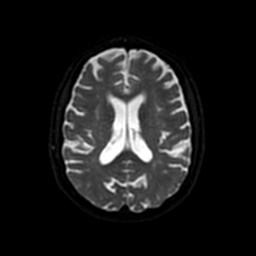
[im 80/100]
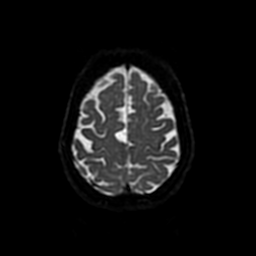
[im 100/100]
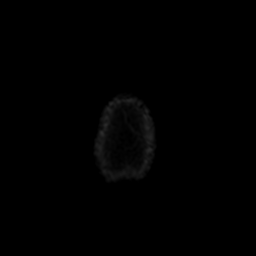

[Series 3: DWI · coronal · 4.0mm · 0.94mm/px · 5 of 74 slices shown (2 of 3)]
[im 1/74]
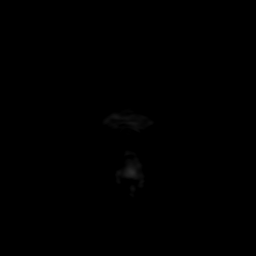
[im 19/74]
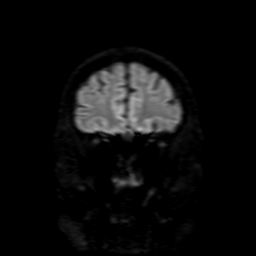
[im 37/74]
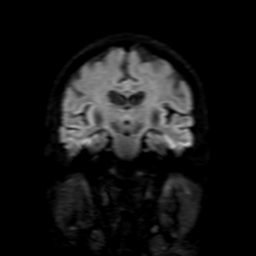
[im 55/74]
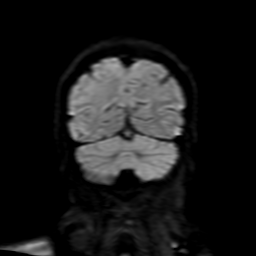
[im 74/74]
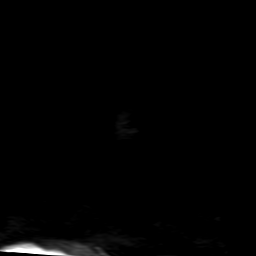

[Series 4: FLAIR · axial · 3.0mm · 0.41mm/px · z∈[-92,+52]mm · 2 of 25 slices shown (1 of 2)]
[im 1/25]
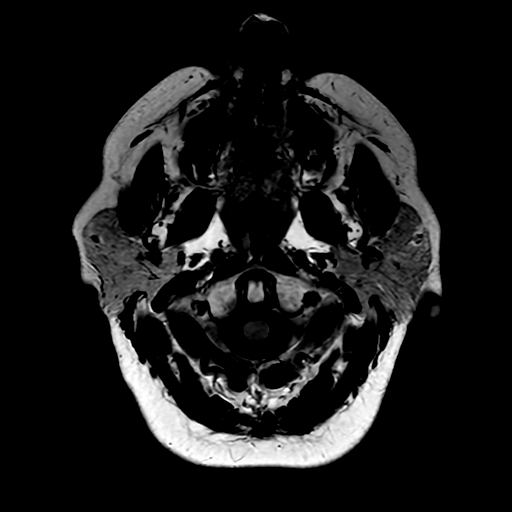
[im 25/25]
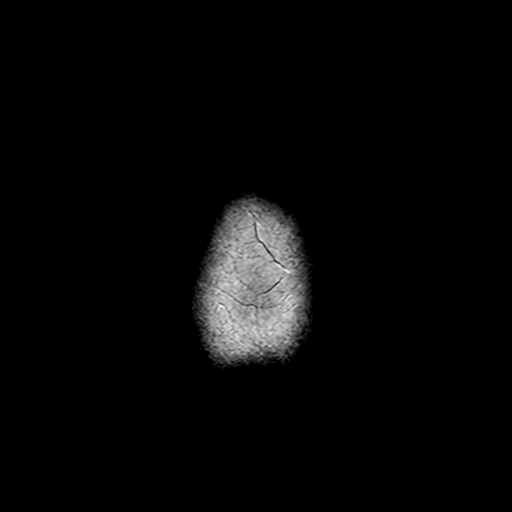

[Series 5: FLAIR · sagittal · 5.0mm · 0.23mm/px · 2 of 23 slices shown (2 of 2)]
[im 1/23]
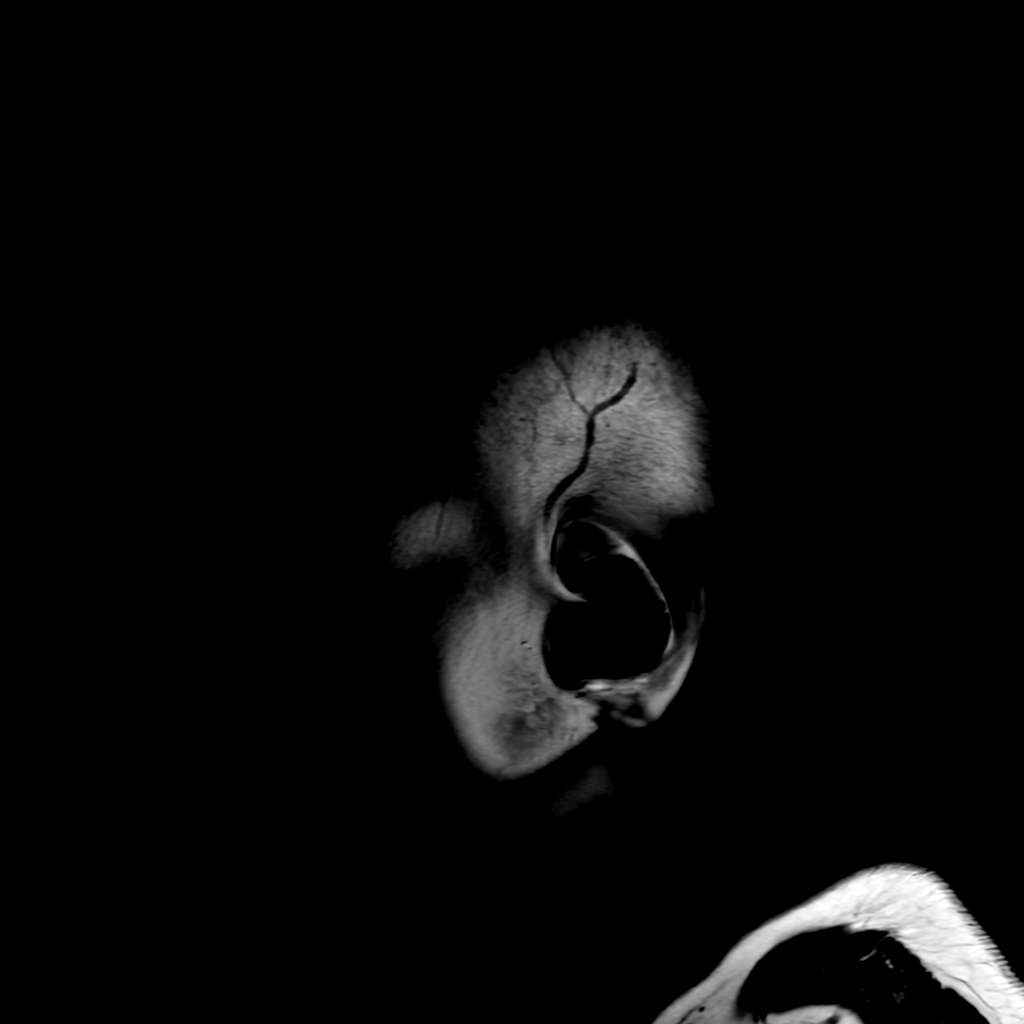
[im 23/23]
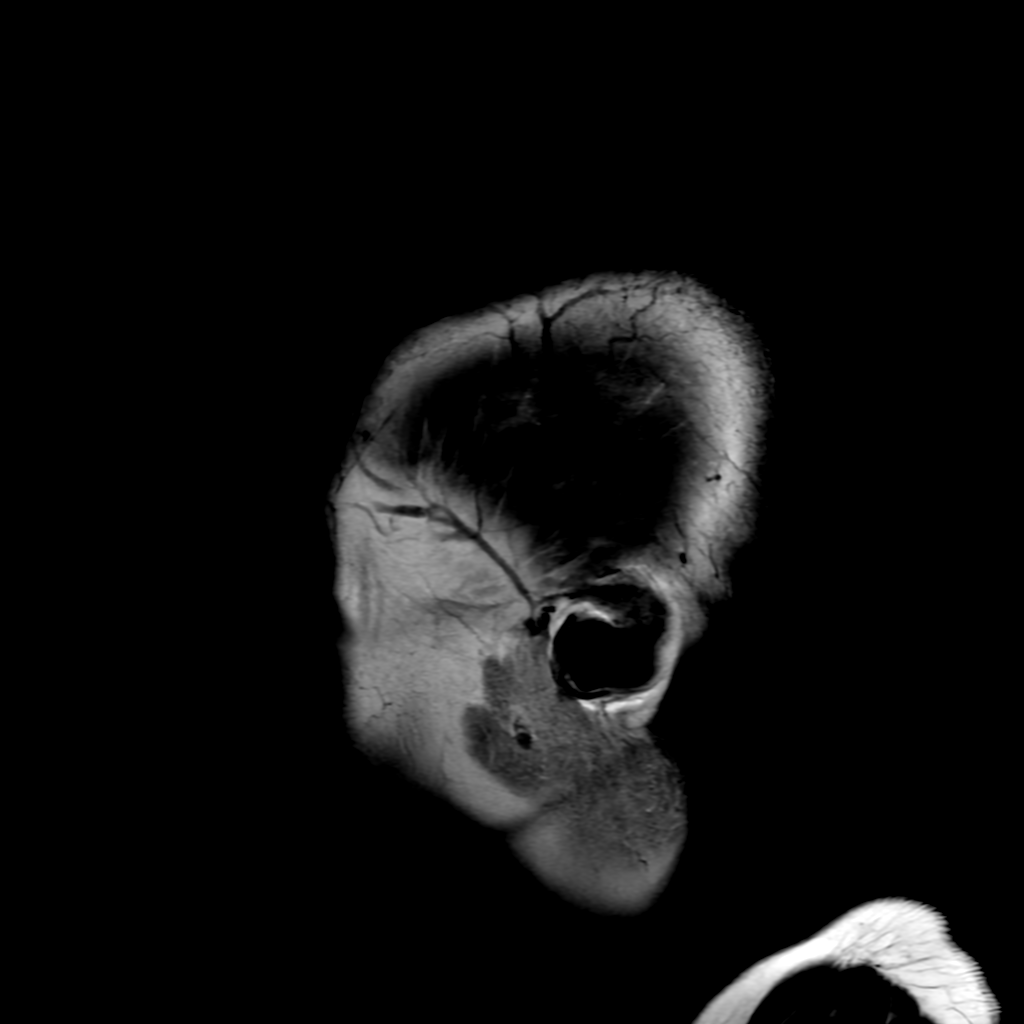

[Series 210: DWI · axial · 3.0mm · 0.94mm/px · z∈[-96,+51]mm · 7 of 100 slices shown (3 of 3)]
[im 1/100]
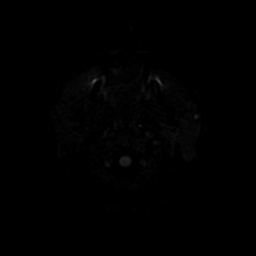
[im 17/100]
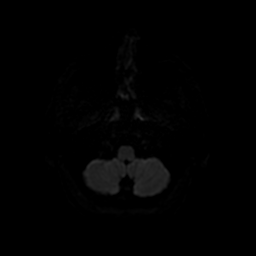
[im 34/100]
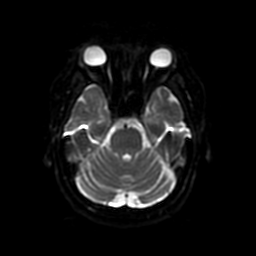
[im 50/100]
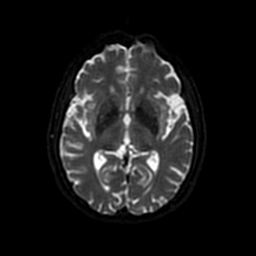
[im 67/100]
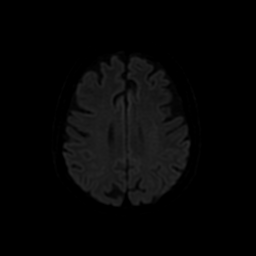
[im 83/100]
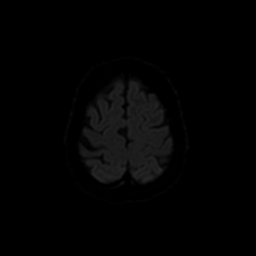
[im 100/100]
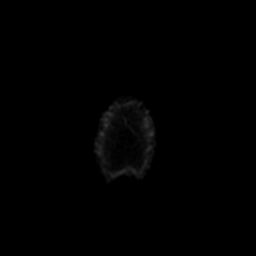

[Series 250: ADC · axial · 3.0mm · 0.94mm/px · z∈[-96,+51]mm · 3 of 50 slices shown (1 of 2)]
[im 1/50]
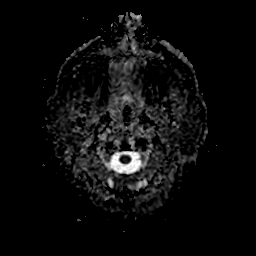
[im 25/50]
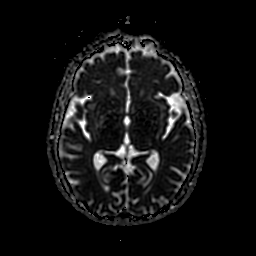
[im 50/50]
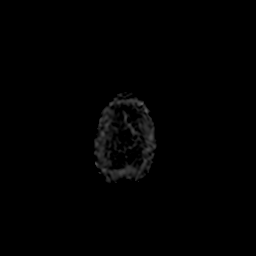

[Series 350: ADC · coronal · 4.0mm · 0.94mm/px · 2 of 37 slices shown (2 of 2)]
[im 1/37]
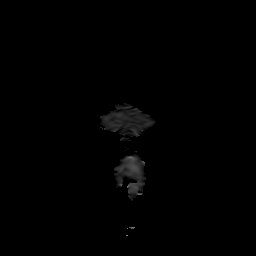
[im 37/37]
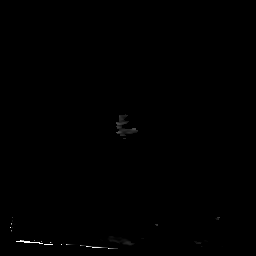

[27 of 48 positions shown; findings below may reference images not displayed]

FINDINGS: Brain: Negative for acute infarct. Scattered small white matter
hyperintensities bilaterally compatible with mild chronic
microvascular ischemia. Mild atrophy without hydrocephalus. Negative
for hemorrhage or mass.

Vascular: Normal arterial flow voids

Skull and upper cervical spine: No focal skeletal lesion.

Sinuses/Orbits: Mild mucosal edema paranasal sinuses. Negative orbit

Other: None
IMPRESSION: Negative for acute infarct

Atrophy and mild chronic microvascular ischemia in the white matter.

## 2019-11-30 IMAGING — CR DG HIP (WITH OR WITHOUT PELVIS) 2-3V*L*
3 series · 3 of 3 positions shown · non-contrast
Comparison: None.

CLINICAL DATA: Fall with hip pain

EXAM:
DG HIP (WITH OR WITHOUT PELVIS) 2-3V LEFT

[pelvis ap]
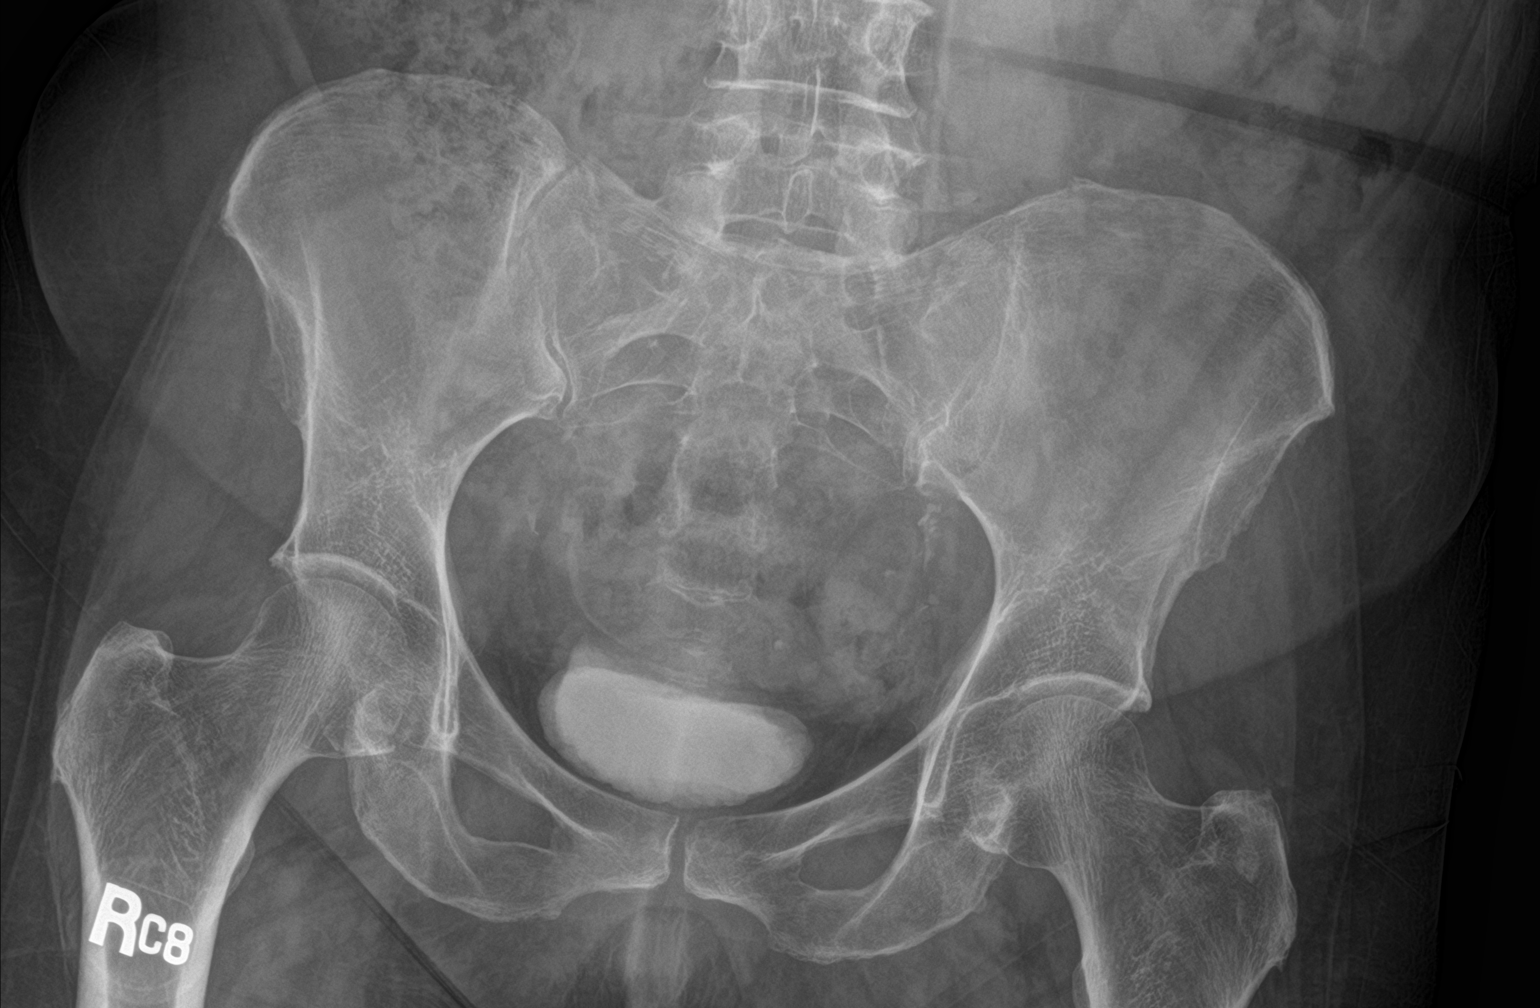

[hip ap]
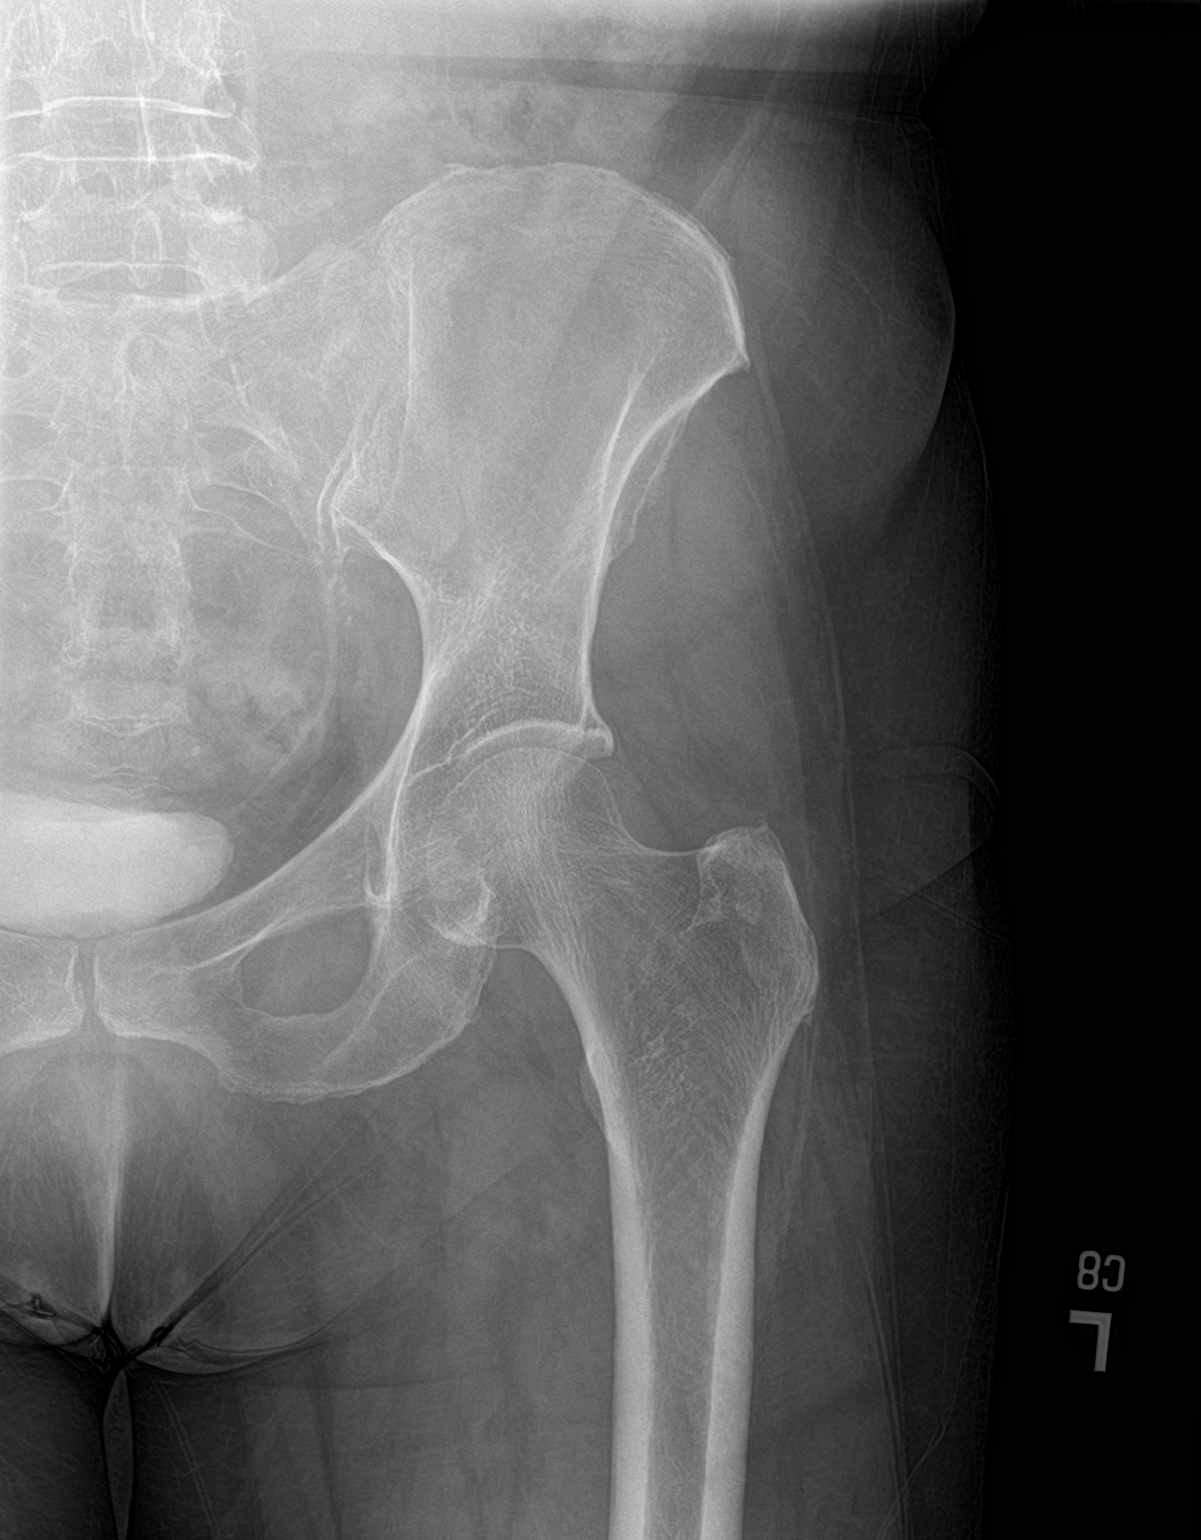

[hip lat]
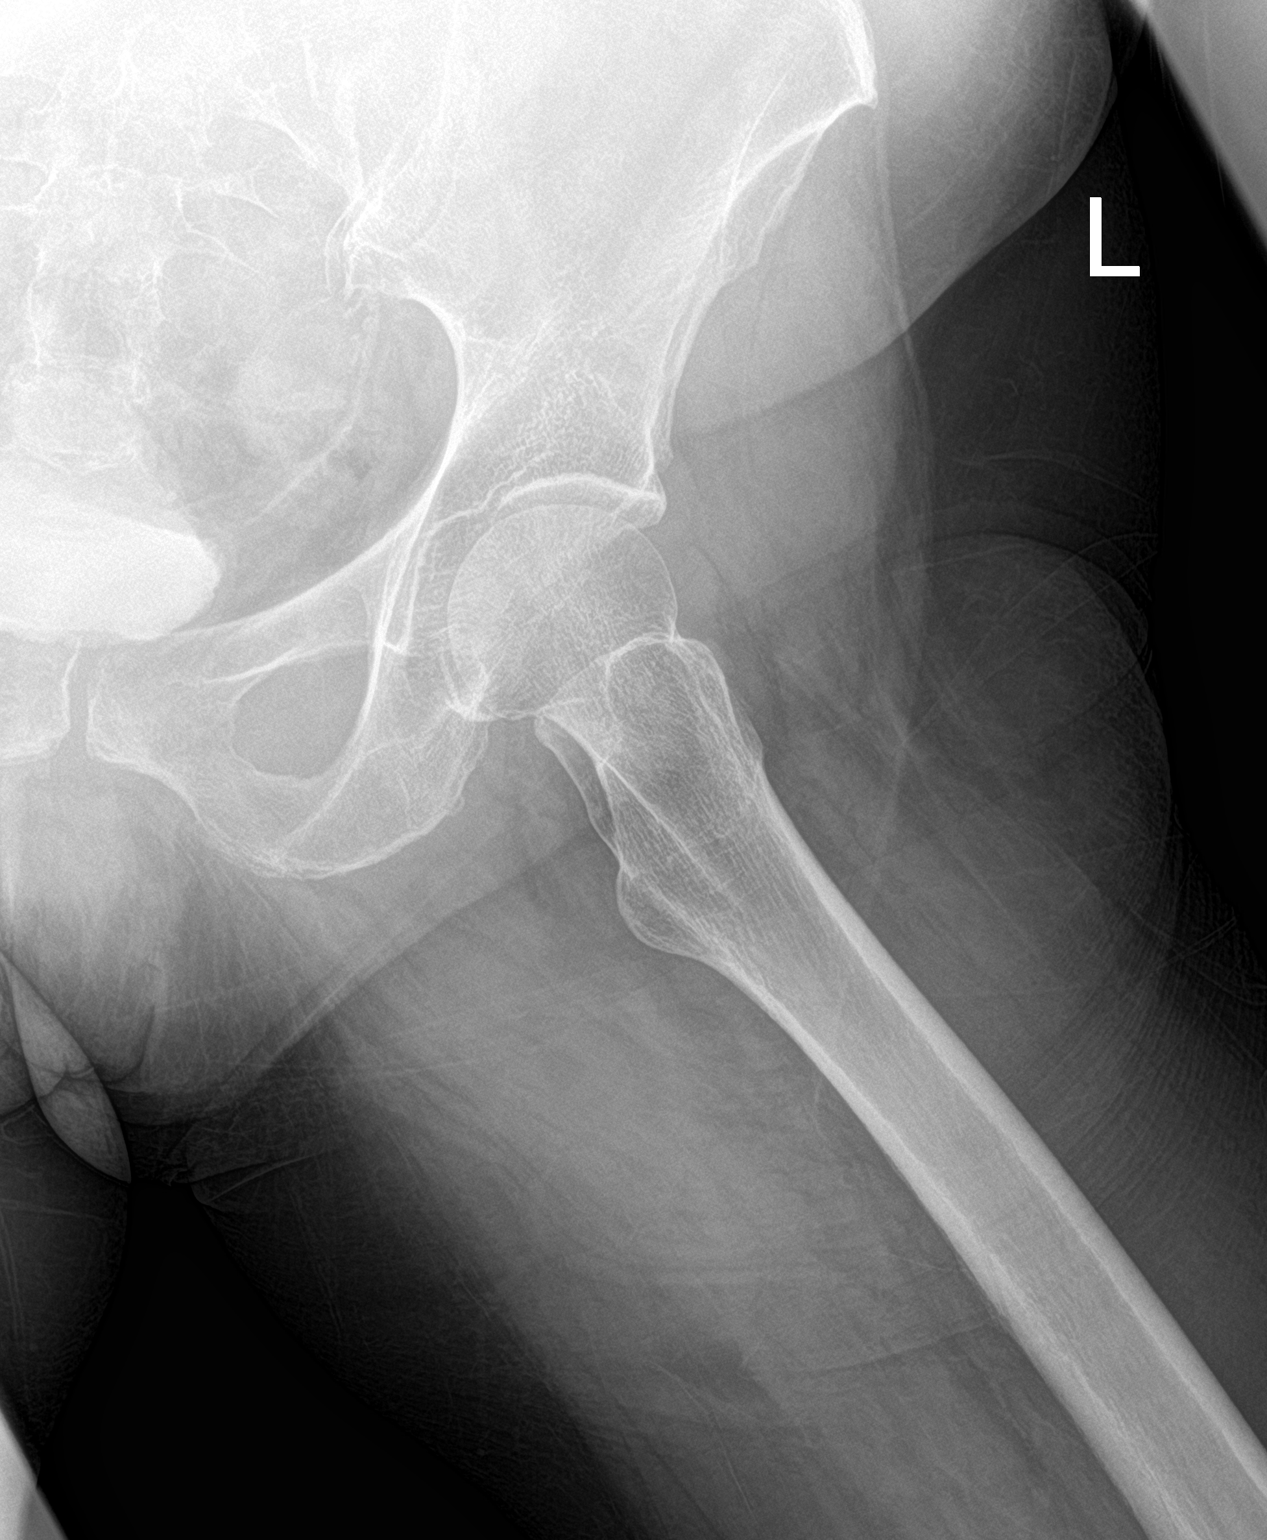

[3 of 3 positions shown; findings below may reference images not displayed]

FINDINGS: Alignment is anatomic. No acute fracture. Joint space is preserved.
Residual contrast within the bladder.
IMPRESSION: Negative.

## 2019-11-30 MED ORDER — SODIUM CHLORIDE 0.9% FLUSH
3.0000 mL | Freq: Once | INTRAVENOUS | Status: AC
  Administered 2019-11-30: 3 mL via INTRAVENOUS

## 2019-11-30 MED ORDER — IOHEXOL 350 MG/ML SOLN
100.0000 mL | Freq: Once | INTRAVENOUS | Status: AC | PRN
  Administered 2019-11-30: 100 mL via INTRAVENOUS

## 2019-11-30 MED ORDER — MECLIZINE HCL 25 MG PO TABS
25.0000 mg | ORAL_TABLET | Freq: Once | ORAL | Status: AC
  Administered 2019-11-30: 25 mg via ORAL
  Filled 2019-11-30: qty 1

## 2019-11-30 MED ORDER — METOCLOPRAMIDE HCL 5 MG/ML IJ SOLN
10.0000 mg | Freq: Once | INTRAMUSCULAR | Status: AC
  Administered 2019-11-30: 10 mg via INTRAVENOUS
  Filled 2019-11-30: qty 2

## 2019-11-30 MED ORDER — SODIUM CHLORIDE 0.9 % IV BOLUS
1000.0000 mL | Freq: Once | INTRAVENOUS | Status: AC
  Administered 2019-11-30: 1000 mL via INTRAVENOUS

## 2019-11-30 MED ORDER — METHYLPREDNISOLONE SODIUM SUCC 125 MG IJ SOLR
125.0000 mg | Freq: Once | INTRAMUSCULAR | Status: AC
  Administered 2019-11-30: 125 mg via INTRAVENOUS
  Filled 2019-11-30: qty 2

## 2019-11-30 NOTE — ED Notes (Signed)
Attempted to call daughter, no answer.

## 2019-11-30 NOTE — Evaluation (Signed)
Physical Therapy Evaluation Patient Details Name: April Ross MRN: [id number] DOB: Apr 12, 1951 Today's Date: 11/30/2019   History of Present Illness  Pt is 69 yo female who presented to ED s/p fall and dizziness with position changes.  CVA workup negative. Xrays L hip negative.  Pt with PMH including but not limited to hypertension, hyperlipidemia and diabetes.  Clinical Impression   Pt admitted with above diagnosis. PT was ordered as imminent d/c and pt likely discharging today.  From PT standpoint, pt has the support at home and mobility in order to return home.  However, will keep on PT caseload to address impairments below (see problem list) and for L hip pain.  In regards to dizziness, pt with no episodes with PT, orthostatic Bps were normal.  Pt was able to ambulate 150' without AD and supervision.  Pt with L hip pain that started 4 days ago that is associated with active movement and weight bearing.  Recommend outpt PT to address. Educated pt on relaxation techniques, comfort positions, and AD and modality use for pain control.  Pt currently with functional limitations due to the deficits listed below (see PT Problem List). Pt will benefit from skilled PT to increase their independence and safety with mobility to allow discharge to the venue listed below.       Follow Up Recommendations Outpatient PT(recommended pt f/u with PCP for outpt PT order if pain continues)    Equipment Recommendations  Other (comment)(pt has cane that she can borrow)    Recommendations for Other Services       Precautions / Restrictions Precautions Precautions: Fall      Mobility  Bed Mobility Overal bed mobility: Needs Assistance Bed Mobility: Supine to Sit;Sit to Supine     Supine to sit: Supervision Sit to supine: Supervision      Transfers Overall transfer level: Needs assistance Equipment used: None Transfers: Sit to/from Stand Sit to Stand: Supervision             Ambulation/Gait Ambulation/Gait assistance: Supervision Gait Distance (Feet): 150 Feet Assistive device: None   Gait velocity: decreased   General Gait Details: First 3-4 steps significant antalgic pattern with decreased WB on left; improved after several steps and only mild antalgic pattern.  No LOB  Stairs            Wheelchair Mobility    Modified Rankin (Stroke Patients Only)       Balance Overall balance assessment: Needs assistance Sitting-balance support: No upper extremity supported;Feet supported Sitting balance-Leahy Scale: Normal     Standing balance support: During functional activity;No upper extremity supported Standing balance-Leahy Scale: Good                               Pertinent Vitals/Pain Pain Assessment: 0-10 Pain Score: 3 (Reports no pain at rest; 3/10 with ambulation now; but can go up to 10/10) Pain Location: L hip Pain Descriptors / Indicators: Sharp;Shooting Pain Intervention(s): Limited activity within patient's tolerance;Repositioned;Monitored during session    [place] expects to be discharged to:: Private residence Living Arrangements: Children Available Help at Discharge: Available 24 hours/day;Family Type of Home: House Home Access: Stairs to enter Entrance Stairs-Rails: None Entrance Stairs-Number of Steps: 3 Home Layout: Able to live on main level with bedroom/bathroom;Multi-level Home Equipment: None      Prior Function Level of Independence: Independent         Comments: Pt assisted with taking  care of her 62 yo uncle     Hand Dominance        Extremity/Trunk Assessment   Upper Extremity Assessment Upper Extremity Assessment: Overall WFL for tasks assessed    Lower Extremity Assessment Lower Extremity Assessment: LLE deficits/detail;RLE deficits/detail RLE Deficits / Details: WNL LLE Deficits / Details: ROM WFL; Knee and ankle MMT 5/5; Hip 3/5 - not further tested due to  pain LLE Sensation: WNL LLE Coordination: WNL    Cervical / Trunk Assessment Cervical / Trunk Assessment: Normal  Communication   Communication: No difficulties  Cognition Arousal/Alertness: Awake/alert Behavior During Therapy: WFL for tasks assessed/performed Overall Cognitive Status: Within Functional Limits for tasks assessed                                 General Comments:   In regards to hip pain, Pt reporting L hip pain since last Thursday or Friday. States she did nothing to injure it and that pain increased over weekend.  The pain starts lateral hip and shoots down thigh to knee. She is not hurting at rest.  With ambulation today reports 3/10 but reports can be as bad as sharp 10/10 at times.  States it hurts with weight bearing, trying to lift leg up, and turning to either side.  When laying still no pain.  Pt had no pain with palpation and no pain with back rotation/flex/ext.  Pt was able to place legs in figure 4 position to don socks in supine and cross legs in supine with no pain.  Had increased pain with active SLR but not with passive SLR or heel slide. X rays were negative. Discussed use of cane or RW for pain control and to allow leg to rest.  Also discussed relaxation techniques and positions of comforts (pillow under knee or between legs) for sleeping.  Recommended use of modalities for pain as needed.  Recommended f/u with PCP to obtain outpt PT order for L hip pain if not improved.    In regards to dizziness, reports going on for months and feels like she is spinning.  Only happens with walking and happens within seconds of standing (worst first thing in mornings).  It goes away after 1-2 mins still in standing.  Orthostatic BP stable at this time. Pt had no dizziness with eye movement, head turns, or position changes today.  Discussed safety with dizziness including taking time with transfers, staying by EOB and weight shifting initially prior to walking to make  sure not dizzy.       General Comments      Exercises     Assessment/Plan    PT Assessment Patient needs continued PT services  PT Problem List Decreased strength;Decreased mobility;Decreased activity tolerance;Pain;Decreased knowledge of use of DME;Decreased balance       PT Treatment Interventions DME instruction;Therapeutic activities;Modalities;Gait training;Therapeutic exercise;Patient/family education;Stair training;Balance training;Functional mobility training;Manual techniques    PT Goals (Current goals can be found in the Care Plan section)  Acute Rehab PT Goals Patient Stated Goal: return home; improve pain PT Goal Formulation: With patient Time For Goal Achievement: 12/14/19 Potential to Achieve Goals: Good    Frequency Min 3X/week   Barriers to discharge        Co-evaluation               AM-PAC PT "6 Clicks" Mobility  Outcome Measure Help needed turning from your back to your side while  in a flat bed without using bedrails?: None Help needed moving from lying on your back to sitting on the side of a flat bed without using bedrails?: None Help needed moving to and from a bed to a chair (including a wheelchair)?: None Help needed standing up from a chair using your arms (e.g., wheelchair or bedside chair)?: None Help needed to walk in hospital room?: None Help needed climbing 3-5 steps with a railing? : A Little 6 Click Score: 23    End of Session   Activity Tolerance: Patient tolerated treatment well Patient left: in bed;with call bell/phone within reach Nurse Communication: Mobility status PT Visit Diagnosis: Pain;Other abnormalities of gait and mobility (R26.89) Pain - Right/Left: Left Pain - part of body: Hip    Time: [id number] PT Time Calculation (min) (ACUTE ONLY): 30 min   Charges:   PT Evaluation $PT Eval Low Complexity: 1 Low          Maggie Font, PT Acute Rehab Services Pager [phone] [facility]  [phone] [facility] [phone]   Karlton Lemon 11/30/2019, 5:59 PM

## 2019-11-30 NOTE — ED Provider Notes (Signed)
Received patient at signout from [facility].  Refer to provider note for full history and physical examination.  Briefly, patient is a 69 year old female presenting for evaluation of unsteady gait, near syncope.  She had an ataxic gait on initial examination and a code stroke was called.  Neurology has seen and evaluated; MRI showed no evidence of acute infarct.  They recommend PT OT evaluation in the ED and likely outpatient follow-up.  She will also get orthostatic vital signs and IV fluids.   Physical Exam  BP (!) 157/82   Pulse 76   Temp 98.4 F (36.9 C)   Resp 16   Ht 5' 2.5" (1.588 m)   Wt 67.1 kg   SpO2 98%   BMI 26.64 kg/m   Physical Exam Vitals and nursing note reviewed.  Constitutional:      General: She is not in acute distress.    Appearance: She is well-developed.     Comments: Resting comfortably in bed  HENT:     Head: Normocephalic and atraumatic.  Eyes:     General:        Right eye: No discharge.        Left eye: No discharge.     Conjunctiva/sclera: Conjunctivae normal.  Neck:     Vascular: No JVD.     Trachea: No tracheal deviation.  Cardiovascular:     Rate and Rhythm: Normal rate.  Pulmonary:     Effort: Pulmonary effort is normal.  Abdominal:     General: There is no distension.  Skin:    General: Skin is warm and dry.     Findings: No erythema.  Neurological:     Mental Status: She is alert.     Comments: Speech is fluent and goal oriented, cranial nerves appear grossly intact.  Moves all extremities spontaneously without difficulty  Psychiatric:        Behavior: Behavior normal.     ED Course/Procedures   Clinical Course as of Nov 29 1941  Mon Nov 30, 2019  1522 Dr. Rory Percy, neurologist, states patient's neurologic imaging studies are free from acute abnormalities. She should have evaluation and work-up for orthostatic changes with possible PT/OT evaluation. Her symptoms may actually be medication related.  Recommend she stop her trazodone.   [SJ]    Clinical Course User Index [SJ] Joy, Shawn C, PA-C    Procedures  MDM  Patient is not orthostatic.  PT's has seen and evaluated the patient in the ED, recommends formal outpatient physical therapy.  On reevaluation the patient is resting comfortably in no apparent distress.  She was ambulated in the ED without any difficulty.  She has a great deal of social support and assistance at home and lives with her daughter and grandchildren.  She will attempt to obtain a cane/walker on an outpatient basis.  Recommend follow-up with PCP for reevaluation of symptoms and for formal physical therapy.  Discussed strict ED return precautions. Patient verbalized understanding of and agreement with plan and is safe for discharge home at this time.       Debroah Baller 11/30/19 1946    Dorie Rank, MD 12/01/19 [phone]

## 2019-11-30 NOTE — ED Notes (Signed)
Pt transported to Xray. 

## 2019-11-30 NOTE — Discharge Instructions (Addendum)
Drink plenty of fluids and get rest.  Use a cane or a walker to help you get around.  Stop taking your trazodone.  Our neurologist is concerned that this may be causing your symptoms.  Follow-up with your primary care provider for reevaluation of your symptoms and to formally refer you to physical therapy for your hip.  Return to the emergency department if any concerning signs or symptoms develop such as loss of consciousness, fevers, severe weakness.   If your blood pressure (BP) was elevated on multiple readings during this visit above 130 for the top number or above 80 for the bottom number, please have this repeated by your primary care provider within one month. You can also check your blood pressure when you are out at a pharmacy or grocery store. Many have machines that will check your blood pressure.  If your blood pressure remains elevated, please follow-up with your PCP.

## 2019-11-30 NOTE — Consult Note (Signed)
Neurology Consultation  Reason for Consult: Code stroke for dizziness Referring Physician: Deno Etienne  CC: Dizziness  History is obtained from: Patient  HPI: April Ross is a 69 y.o. female with significant past medical history of hypertension, hyperlipidemia and diabetes.  She presented to ED with sudden onset of unsteadiness and fall that occurred at 10:15 AM.  Code stroke was called at 1330.  Patient states that she has been having periods of vertiginous and dizziness-like sensations on and off for a few months.  In the past when they have occurred it is when she is moving from a sitting to a standing position. The episodes only last for a few seconds.  This was slightly different this time as she does not recall what happened prior to falling. Upon entering the room to bring her to CT scan, patient states that she was at baseline.  Patient denies any headache, photophobia, phonophobia.  Patient denies any further neurological abnormalities.  ED course  CT head-no acute changes CTA head and neck-no emergent LVO.  Calcified atherosclerotic plaques in both carotid siphons extending into the supraclinoid segments on the right without hemodynamically significant stenosis. CT perfusion-no mismatch  Chart review-none  LKW: 10 AM this morning however patient states that the symptoms have been going on and off for the last few months tpa given?: no,  Premorbid modified Rankin scale (mRS): 0 NIH stroke score: 0   Past Medical History:  Diagnosis Date  . Allergy   . Arthritis   . CRD (chronic renal disease)   . Depression   . Diabetes mellitus without complication ([place])   . GERD (gastroesophageal reflux disease)   . Hyperlipidemia   . Hypertension   . LBBB (left bundle branch block)    a. diagnosed in 10/2017 - echo showed a preserved EF of 55-60% and NST was low-risk showing no evidence of ischemia.   . polyps in colon   . Thyroid disease   . Urinary tract infection       Family History  Problem Relation Age of Onset  . Alzheimer's disease Mother   . Hyperlipidemia Mother   . Hypertension Mother   . Asthma Maternal Grandmother   . Diabetes Maternal Grandfather   . Alcohol abuse Maternal Grandfather   . Hyperlipidemia Maternal Grandfather   . Stroke Maternal Grandfather   . Hypertension Maternal Grandfather   . Diabetes Paternal Grandmother   . Alcohol abuse Maternal Uncle   . Cholecystitis Maternal Uncle   . Arthritis Maternal Uncle     Social History:   reports that she has been smoking cigarettes. She has been smoking about 0.25 packs per day. She has never used smokeless tobacco. She reports current alcohol use. She reports that she does not use drugs.  Medications  Current Facility-Administered Medications:  .  sodium chloride flush (NS) 0.9 % injection 3 mL, 3 mL, Intravenous, Once, Deno Etienne, DO  Current Outpatient Medications:  .  alendronate (FOSAMAX) 70 MG tablet, TAKE 1 TABLET BY MOUTH EVERY 7 DAYS WITH A FULL GLASS OF WATER ON AN EMPTY STOMACH, Disp: 12 tablet, Rfl: 3 .  aspirin EC 81 MG tablet, Take 81 mg by mouth daily., Disp: , Rfl:  .  buPROPion (WELLBUTRIN XL) 150 MG 24 hr tablet, TAKE 1 TABLET BY MOUTH EVERY DAY, Disp: 90 tablet, Rfl: 1 .  Calcium Carb-Cholecalciferol (CALCIUM-VITAMIN D) 500-200 MG-UNIT tablet, Take 1 tablet by mouth daily., Disp: , Rfl:  .  cetirizine (ZYRTEC) 10 MG tablet, Take  10 mg by mouth at bedtime., Disp: , Rfl:  .  FLUoxetine (PROZAC) 40 MG capsule, TAKE 1 CAPSULE BY MOUTH EVERY DAY, Disp: 90 capsule, Rfl: 1 .  fluticasone (FLONASE) 50 MCG/ACT nasal spray, SPRAY 2 SPRAYS INTO EACH NOSTRIL EVERY DAY, Disp: 48 mL, Rfl: 1 .  levothyroxine (SYNTHROID) 88 MCG tablet, TAKE 1 TABLET BY MOUTH EVERY DAY, Disp: 90 tablet, Rfl: 2 .  losartan (COZAAR) 100 MG tablet, TAKE 1 TABLET BY MOUTH EVERY DAY, Disp: 90 tablet, Rfl: 1 .  metFORMIN (GLUCOPHAGE) 500 MG tablet, TAKE 1 TABLET BY MOUTH 2 TIMES DAILY WITH A MEAL.,  Disp: 180 tablet, Rfl: 1 .  omeprazole (PRILOSEC) 20 MG capsule, TAKE 1 CAPSULE BY MOUTH EVERY DAY, Disp: 90 capsule, Rfl: 3 .  predniSONE (DELTASONE) 20 MG tablet, 3 tabs by mouth daily x 3 days, then 2 tabs by mouth daily x 2 days then 1 tab by mouth daily x 2 days, Disp: 15 tablet, Rfl: 0 .  rosuvastatin (CRESTOR) 20 MG tablet, TAKE 1 TABLET BY MOUTH EVERY DAY, Disp: 90 tablet, Rfl: 1 .  tiZANidine (ZANAFLEX) 4 MG tablet, TAKE 1 TABLET BY MOUTH AT BEDTIME AS NEEDED FOR MUSCLE SPASMS., Disp: 90 tablet, Rfl: 0 .  traZODone (DESYREL) 100 MG tablet, TAKE 1 TABLET BY MOUTH AT BEDTIME, Disp: 90 tablet, Rfl: 2  ROS:   General ROS: negative for - chills, fatigue, fever, night sweats, weight gain or weight loss Psychological ROS: negative for - behavioral disorder, hallucinations, memory difficulties, mood swings or suicidal ideation Ophthalmic ROS: negative for - blurry vision, double vision, eye pain or loss of vision ENT ROS: Positive for -dizziness Allergy and Immunology ROS: negative for - hives or itchy/watery eyes Hematological and Lymphatic ROS: negative for - bleeding problems, bruising or swollen lymph nodes Endocrine ROS: negative for - galactorrhea, hair pattern changes, polydipsia/polyuria or temperature intolerance Respiratory ROS: negative for - cough, hemoptysis, shortness of breath or wheezing Cardiovascular ROS: negative for - chest pain, dyspnea on exertion, edema or irregular heartbeat Gastrointestinal ROS: negative for - abdominal pain, diarrhea, hematemesis, nausea/vomiting or stool incontinence Genito-Urinary ROS: negative for - dysuria, hematuria, incontinence or urinary frequency/urgency Musculoskeletal ROS: negative for - joint swelling or muscular weakness Neurological ROS: as noted in HPI Dermatological ROS: negative for [place] and skin lesion changes  Exam: Current vital signs: BP (!) 157/82   Pulse 76   Temp 98.4 F (36.9 C)   Resp 16   Ht 5' 2.5" (1.588 m)   Wt  67.1 kg   SpO2 98%   BMI 26.64 kg/m  Vital signs in last 24 hours: Temp:  [98.4 F (36.9 C)] 98.4 F (36.9 C) (02/22 1153) Pulse Rate:  [76] 76 (02/22 1150) Resp:  [16] 16 (02/22 1150) BP: (157)/(82) 157/82 (02/22 1150) SpO2:  [98 %] 98 % (02/22 1150) Weight:  [67.1 kg] 67.1 kg (02/22 1151)   Constitutional: Appears well-developed and well-nourished.  Psych: Affect appropriate to situation Eyes: No scleral injection HENT: No OP obstrucion Head: Normocephalic.  Cardiovascular: Normal rate and regular rhythm.  Respiratory: Effort normal, non-labored breathing GI: Soft.  No distension. There is no tenderness.  Skin: WDI  Neuro: Mental Status: Patient is awake, alert, oriented to person, place, month, year, and situation. Speech-intact naming, repeating, comprehension Patient is able to give a clear and coherent history. Cranial Nerves: II: Visual Fields are full.  III,IV, VI: EOMI without ptosis or diploplia. Pupils equal, round and reactive to light V: Facial sensation is  symmetric to temperature VII: Facial movement is symmetric.  VIII: hearing is intact to voice X: Palat elevates symmetrically XI: Shoulder shrug is symmetric. XII: tongue is midline without atrophy or fasciculations.  Motor: Tone is normal. Bulk is normal. 5/5 strength was present in all four extremities.  No drift No aterixis Sensory: Sensation is symmetric to light touch and temperature in the arms.  Patient does note that she has sciatic-like pain when lifting left leg. Stocking distribution temperature and PP but intact vibration and proprioception.  DSS intact Deep Tendon Reflexes: 2+ and symmetric in the biceps and patellae.  Plantars: Toes are downgoing bilaterally.  Cerebellar: FNF and HKS are intact bilaterally  Labs I have reviewed labs in epic and the results pertinent to this consultation are:   CBC    Component Value Date/Time   WBC 6.1 11/30/2019 1159   RBC 4.44 11/30/2019  1159   HGB 11.9 (L) 11/30/2019 1213   HCT 35.0 (L) 11/30/2019 1213   PLT 254 11/30/2019 1159   MCV 86.3 11/30/2019 1159   MCH 27.7 11/30/2019 1159   MCHC 32.1 11/30/2019 1159   RDW 13.4 11/30/2019 1159   LYMPHSABS 1.5 11/30/2019 1159   MONOABS 0.7 11/30/2019 1159   EOSABS 0.5 11/30/2019 1159   BASOSABS 0.1 11/30/2019 1159    CMP     Component Value Date/Time   NA 136 11/30/2019 1213   K 4.3 11/30/2019 1213   CL 106 11/30/2019 1213   CO2 23 11/30/2019 1159   GLUCOSE 125 (H) 11/30/2019 1213   BUN 21 11/30/2019 1213   CREATININE 1.40 (H) 11/30/2019 1213   CALCIUM 9.6 11/30/2019 1159   PROT 6.6 11/30/2019 1159   ALBUMIN 4.2 11/30/2019 1159   AST 42 (H) 11/30/2019 1159   ALT 37 11/30/2019 1159   ALKPHOS 46 11/30/2019 1159   BILITOT 0.6 11/30/2019 1159   GFRNONAA 36 (L) 11/30/2019 1159   GFRAA 41 (L) 11/30/2019 1159    Lipid Panel     Component Value Date/Time   CHOL 165 08/31/2019 1055   TRIG 251.0 (H) 08/31/2019 1055   HDL 35.20 (L) 08/31/2019 1055   CHOLHDL 5 08/31/2019 1055   VLDL 50.2 (H) 08/31/2019 1055   LDLCALC 61 10/28/2017 0445   LDLDIRECT 96.0 08/31/2019 1055     Imaging I have reviewed the images obtained:  CT-scan of the brain-no acute intracranial abnormalities.  Mild cerebral atrophy with mild chronic microvascular ischemic changes in the cerebral white matter.   Etta Quill PA-C [facility] [phone]  M-F  (9:00 am- 5:00 PM)  11/30/2019, 1:39 PM    Assessment: This is a 69 year old female presented to the hospital with intermittent symptoms of dizziness over the past months with positional changes. Reason for coming to the ED today was secondary to episode with caused her to fall, patient is unclear if she was dizzy at the time. Code stroke called due to  Dizziness, ataxia noted by ED provider and fall-concern for posterior circulation stroke.   CTA head and neck showed minimal cervical atherosclerotic disease, no hemodynamic  stenosis, no large vessel occlusion, both carotid syphon's do show calcified plaques extending into the supraclinoid segment on the right but without hemodynamically significant stenosis. MRI was negative for stroke.  At this time, symptoms might be attributed to peripheral neuropathy versus a peripheral vertiginous process. Other etiologies-side effect of trazodone which can cause dizziness, ataxia and sedating side effects.  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  Patient evaluated a code stroke. Agree with the history and physical as documented above. Agree with the plan as documented, which I helped formulate. I have independently reviewed the chart, obtained history, review of systems and examined the patient.I have personally reviewed pertinent head/neck/spine imaging (CT/MRI). Please feel free to call with any questions. --- Amie Portland, MD [facility] Pager: [phone]  If 7pm to 7am, please call on call as listed on [facility]ION.

## 2019-11-30 NOTE — Code Documentation (Addendum)
Pt awoke in usual state of health.Pt came to ED following a fall at 1015 this morning. She had been dizzy off and on for several months. Her NIHSS is 0. CT, CTA, CTP done, MRI in progress. NIHSS remains 0. MRI complete, disposition, pt back to ED. Not candidate for TPA because stroke not suspected.

## 2019-11-30 NOTE — ED Triage Notes (Signed)
Pt reports intermittent dizziness for about 6 months. States she saw a doctor and was given nasal spray and prednisone for it. Reports a swishing sound in her right ear. Dizziness when standing and had 2 falls this morning.

## 2019-11-30 NOTE — ED Provider Notes (Signed)
[facility] EMERGENCY DEPARTMENT Provider Note   CSN: [id number] Arrival date & time: 11/30/19  1137  An emergency department physician performed an initial assessment on this suspected stroke patient at 1326.  History Chief Complaint  Patient presents with  . Dizziness    April Ross is a 69 y.o. female.  HPI      April Ross is a 69 y.o. female, with a history of DM, chronic kidney disease, hyperlipidemia, HTN, presenting to the ED with sudden onset unsteadiness that occurred at about 10:15 AM this morning.  She was normal at 10 AM this morning.  Patient states she was walking down her steps, remember stepping on the last step, and then "was suddenly on the ground."  She does not remember the actual fall.  She was helped to her feet by her grandchildren who assisted her back to the house.  During this time she states she was quite dizzy with a "room spinning" quality to it. She has had instances of lightheadedness in the past, usually with moving from a sitting to a standing position.  She notes some pain to her left hip that she states has been present for about the last week, sharp and shooting pain distally into the thigh, moderate to severe.  She does not think she has additional pain from the fall.  Denies headache, back pain, chest pain, shortness of breath, cough, fever/chills, abdominal pain, numbness, weakness, vision deficits, or any other complaints.    Past Medical History:  Diagnosis Date  . Allergy   . Arthritis   . CRD (chronic renal disease)   . Depression   . Diabetes mellitus without complication ([place])   . GERD (gastroesophageal reflux disease)   . Hyperlipidemia   . Hypertension   . LBBB (left bundle branch block)    a. diagnosed in 10/2017 - echo showed a preserved EF of 55-60% and NST was low-risk showing no evidence of ischemia.   . polyps in colon   . Thyroid disease   . Urinary tract infection     Patient Active  Problem List   Diagnosis Date Noted  . ETD (Eustachian tube dysfunction), right 08/04/2019  . Chronic bilateral thoracic back pain 11/21/2018  . Type 2 diabetes mellitus with nephropathy ([place]) 10/27/2017  . Tobacco abuse 10/27/2017  . Nonspecific chest pain   . Intermittent lightheadedness 05/16/2017  . Osteoporosis 05/24/2016  . CKD (chronic kidney disease) stage 3, GFR 30-59 ml/min ([facility]) 05/08/2016  . Pure hypercholesterolemia 01/12/2016  . Hypothyroidism 01/12/2016  . Essential hypertension, benign 01/12/2016  . Allergic rhinitis 01/12/2016  . Moderate major depression, single episode ([place]) 01/12/2016  . Chronic insomnia 01/12/2016  . GERD (gastroesophageal reflux disease) 01/12/2016    Past Surgical History:  Procedure Laterality Date  . ABDOMINAL HYSTERECTOMY    . APPENDECTOMY       OB History   No obstetric history on file.     Family History  Problem Relation Age of Onset  . Alzheimer's disease Mother   . Hyperlipidemia Mother   . Hypertension Mother   . Asthma Maternal Grandmother   . Diabetes Maternal Grandfather   . Alcohol abuse Maternal Grandfather   . Hyperlipidemia Maternal Grandfather   . Stroke Maternal Grandfather   . Hypertension Maternal Grandfather   . Diabetes Paternal Grandmother   . Alcohol abuse Maternal Uncle   . Cholecystitis Maternal Uncle   . Arthritis Maternal Uncle     Social History   Tobacco  Use  . Smoking status: Current Every Day Smoker    Packs/day: 0.25    Types: Cigarettes  . Smokeless tobacco: Never Used  Substance Use Topics  . Alcohol use: Yes    Alcohol/week: 0.0 standard drinks    Comment: occ glass of wine or mix drink  . Drug use: No    Home Medications Prior to Admission medications   Medication Sig Start Date End Date Taking? Authorizing Provider  alendronate (FOSAMAX) 70 MG tablet TAKE 1 TABLET BY MOUTH EVERY 7 DAYS WITH A FULL GLASS OF WATER ON AN EMPTY STOMACH 09/23/19   Bedsole, Amy E, MD  aspirin EC 81  MG tablet Take 81 mg by mouth daily.    [provider]  buPROPion (WELLBUTRIN XL) 150 MG 24 hr tablet TAKE 1 TABLET BY MOUTH EVERY DAY 10/28/19   Bedsole, Amy E, MD  Calcium Carb-Cholecalciferol (CALCIUM-VITAMIN D) 500-200 MG-UNIT tablet Take 1 tablet by mouth daily.    [provider]  cetirizine (ZYRTEC) 10 MG tablet Take 10 mg by mouth at bedtime.    [provider]  FLUoxetine (PROZAC) 40 MG capsule TAKE 1 CAPSULE BY MOUTH EVERY DAY 10/28/19   Bedsole, Amy E, MD  fluticasone (FLONASE) 50 MCG/ACT nasal spray SPRAY 2 SPRAYS INTO EACH NOSTRIL EVERY DAY 11/02/19   Bedsole, Amy E, MD  levothyroxine (SYNTHROID) 88 MCG tablet TAKE 1 TABLET BY MOUTH EVERY DAY 09/21/19   Bedsole, Amy E, MD  losartan (COZAAR) 100 MG tablet TAKE 1 TABLET BY MOUTH EVERY DAY 08/07/19   Bedsole, Amy E, MD  metFORMIN (GLUCOPHAGE) 500 MG tablet TAKE 1 TABLET BY MOUTH 2 TIMES DAILY WITH A MEAL. 09/25/19   Bedsole, Amy E, MD  omeprazole (PRILOSEC) 20 MG capsule TAKE 1 CAPSULE BY MOUTH EVERY DAY 09/22/19   Bedsole, Amy E, MD  predniSONE (DELTASONE) 20 MG tablet 3 tabs by mouth daily x 3 days, then 2 tabs by mouth daily x 2 days then 1 tab by mouth daily x 2 days 09/10/19   Bedsole, Amy E, MD  rosuvastatin (CRESTOR) 20 MG tablet TAKE 1 TABLET BY MOUTH EVERY DAY 08/20/19   Bedsole, Amy E, MD  tiZANidine (ZANAFLEX) 4 MG tablet TAKE 1 TABLET BY MOUTH AT BEDTIME AS NEEDED FOR MUSCLE SPASMS. 08/18/19   Bedsole, Amy E, MD  traZODone (DESYREL) 100 MG tablet TAKE 1 TABLET BY MOUTH AT BEDTIME 06/13/19   Bedsole, Amy E, MD    Allergies    Acetaminophen, Asa [aspirin], Ibuprofen, and Other  Review of Systems   Review of Systems  Constitutional: Negative for chills, diaphoresis and fever.  Respiratory: Negative for cough and shortness of breath.   Cardiovascular: Negative for chest pain.  Gastrointestinal: Positive for nausea. Negative for abdominal pain and vomiting.  Genitourinary: Negative for dysuria,  frequency and hematuria.  Musculoskeletal: Positive for arthralgias.  Neurological: Positive for dizziness and light-headedness. Negative for weakness, numbness and headaches.  All other systems reviewed and are negative.   Physical Exam Updated Vital Signs BP (!) 157/82   Pulse 76   Temp 98.4 F (36.9 C)   Resp 16   Ht 5' 2.5" (1.588 m)   Wt 67.1 kg   SpO2 98%   BMI 26.64 kg/m   Physical Exam Vitals and nursing note reviewed.  Constitutional:      General: She is not in acute distress.    Appearance: She is well-developed. She is not diaphoretic.  HENT:     Head: Normocephalic and  atraumatic.     Nose: Nose normal.     Mouth/Throat:     Mouth: Mucous membranes are moist.     Pharynx: Oropharynx is clear.  Eyes:     Extraocular Movements: Extraocular movements intact.     Conjunctiva/sclera: Conjunctivae normal.     Pupils: Pupils are equal, round, and reactive to light.  Neck:   Cardiovascular:     Rate and Rhythm: Normal rate and regular rhythm.     Pulses: Normal pulses.          Radial pulses are 2+ on the right side and 2+ on the left side.       Posterior tibial pulses are 2+ on the right side and 2+ on the left side.     Heart sounds: Normal heart sounds.     Comments: Tactile temperature in the extremities appropriate and equal bilaterally. Pulmonary:     Effort: Pulmonary effort is normal. No respiratory distress.     Breath sounds: Normal breath sounds.  Abdominal:     Palpations: Abdomen is soft.     Tenderness: There is no abdominal tenderness. There is no guarding.  Musculoskeletal:     Cervical back: Normal range of motion and neck supple.     Right lower leg: No edema.     Left lower leg: No edema.     Comments: Patient does not have any discernible tenderness, color change, deformity, or instability to the left hip, however, she seems to have a great deal of pain with attempted range of motion of the left hip. No tenderness, deformity, swelling,  or instability to the rest of the left lower extremity.  No pain with range of motion of the left knee or ankle.  Normal motor function intact in all other extremities. No midline spinal tenderness.   Lymphadenopathy:     Cervical: No cervical adenopathy.  Skin:    General: Skin is warm and dry.  Neurological:     Mental Status: She is alert and oriented to person, place, and time.     Comments: Patient shows signs of ataxia with an accuracy and hesitation performing finger-to-nose testing on the left. No discernible difference with heel-to-shin testing. Positive Romberg. No noted acute cognitive deficit. Sensation grossly intact to light touch in the extremities.   Grip strengths equal bilaterally.   Strength 5/5 in all extremities.   Cranial nerves III-XII grossly intact.  Handles oral secretions without noted difficulty.  No noted phonation or speech deficit. No facial droop.   No onset of dizziness or other complaint with range of motion of the neck.  Psychiatric:        Mood and Affect: Mood and affect normal.        Speech: Speech normal.        Behavior: Behavior normal.     ED Results / Procedures / Treatments   Labs (all labs ordered are listed, but only abnormal results are displayed) Labs Reviewed  COMPREHENSIVE METABOLIC PANEL - Abnormal; Notable for the following components:      Result Value   Glucose, Bld 127 (*)    Creatinine, Ser 1.47 (*)    AST 42 (*)    GFR calc non Af Amer 36 (*)    GFR calc Af Amer 41 (*)    All other components within normal limits  I-STAT CHEM 8, ED - Abnormal; Notable for the following components:   Creatinine, Ser 1.40 (*)    Glucose, Bld 125 (*)  Calcium, Ion 0.99 (*)    Hemoglobin 11.9 (*)    HCT 35.0 (*)    All other components within normal limits  CBG MONITORING, ED - Abnormal; Notable for the following components:   Glucose-Capillary 116 (*)    All other components within normal limits  PROTIME-INR  APTT  CBC    DIFFERENTIAL    BUN  Date Value Ref Range Status  11/30/2019 21 8 - 23 mg/dL Final  11/30/2019 18 8 - 23 mg/dL Final  08/31/2019 19 6 - 23 mg/dL Final  04/02/2019 20 6 - 23 mg/dL Final   Creatinine, Ser  Date Value Ref Range Status  11/30/2019 1.40 (H) 0.44 - 1.00 mg/dL Final  11/30/2019 1.47 (H) 0.44 - 1.00 mg/dL Final  08/31/2019 1.39 (H) 0.40 - 1.20 mg/dL Final  04/02/2019 1.44 (H) 0.40 - 1.20 mg/dL Final     EKG EKG Interpretation  Date/Time:  Monday November 30 2019 11:52:13 EST Ventricular Rate:  83 PR Interval:  144 QRS Duration: 128 QT Interval:  428 QTC Calculation: 502 R Axis:   120 Text Interpretation: Normal sinus rhythm Right axis deviation Non-specific intra-ventricular conduction block Cannot rule out Anteroseptal infarct , age undetermined Abnormal ECG Since last tracing rate faster Otherwise no significant change Confirmed by Deno Etienne [phone]) on 11/30/2019 12:20:34 PM   Radiology CT ANGIO HEAD W OR WO CONTRAST  Result Date: 11/30/2019 CLINICAL DATA:  Ataxia EXAM: CT ANGIOGRAPHY HEAD AND NECK CT PERFUSION BRAIN TECHNIQUE: Multidetector CT imaging of the head and neck was performed using the standard protocol during bolus administration of intravenous contrast. Multiplanar CT image reconstructions and MIPs were obtained to evaluate the vascular anatomy. Carotid stenosis measurements (when applicable) are obtained utilizing NASCET criteria, using the distal internal carotid diameter as the denominator. Multiphase CT imaging of the brain was performed following IV bolus contrast injection. Subsequent parametric perfusion maps were calculated using RAPID software. CONTRAST:  144mL OMNIPAQUE IOHEXOL 350 MG/ML SOLN COMPARISON:  Head CT February 22nd 2021 FINDINGS: CTA NECK FINDINGS Aortic arch:, origin of the innominate and left common carotid artery. Small calcified plaques in the aortic arch. Imaged portion shows no evidence of aneurysm or dissection. No significant  stenosis of the major arch vessel origins. Right carotid system: Small calcified plaque in the right carotid bifurcation without hemodynamically significant stenosis. Left carotid system: Small calcified plaques in the left carotid bifurcation without hemodynamically significant stenosis. Vertebral arteries: Calcified plaque at the origin of the right vertebral artery and along the cervical segment of the bilateral vertebral arteries without hemodynamically significant stenosis. Skeleton: Aggressive process seen. Other neck: None. Upper chest: Negative. Review of the MIP images confirms the above findings CTA HEAD FINDINGS Anterior circulation: Calcified plaques are seen in the bilateral carotid siphons extending into the supraclinoid segment on the right side, without hemodynamically significant stenosis. The bilateral ACA and MCA vascular trees are maintained. Incidentally noted is a fenestration at the left A1-A2 junction. Posterior circulation: Calcified plaque is seen at the left V3-V4 junction, without stenosis. Otherwise the bilateral intracranial vertebral arteries, the basilar artery and bilateral posterior cerebral arteries are maintained. Venous sinuses: As permitted by contrast timing, patent. Anatomic variants: Small fenestration at the left A1-A2/ACA. Review of the MIP images confirms the above findings CT Brain Perfusion Findings: ASPECTS: 10 CBF (<30%) Volume: 70mL Perfusion (Tmax>6.0s) volume: 69mL Mismatch Volume: 5mL Infarction Location:Not applicable IMPRESSION: 1. Minimal cervical atherosclerotic disease. No hemodynamically significant stenosis. 2. No large vessel occlusion. 3. No core infarct or  significant penumbra. 4. Calcified atherosclerotic plaques in both carotid siphons extending into the supraclinoid segment on the right, without hemodynamically significant stenosis. Aortic Atherosclerosis (ICD10-I70.0). Electronically Signed   By: Pedro Earls M.D.   On: 11/30/2019 14:14    CT HEAD WO CONTRAST  Result Date: 11/30/2019 CLINICAL DATA:  69 year old female with history of vertigo. Possible stroke. EXAM: CT HEAD WITHOUT CONTRAST TECHNIQUE: Contiguous axial images were obtained from the base of the skull through the vertex without intravenous contrast. COMPARISON:  No priors. FINDINGS: Brain: Mild cerebral atrophy. Patchy areas of decreased attenuation are noted throughout the deep and periventricular white matter of the cerebral hemispheres bilaterally, compatible with mild chronic microvascular ischemic disease. No evidence of acute infarction, hemorrhage, hydrocephalus, extra-axial collection or mass lesion/mass effect. Vascular: No hyperdense vessel or unexpected calcification. Skull: Normal. Negative for fracture or focal lesion. Sinuses/Orbits: No acute finding. Other: None. IMPRESSION: 1. No acute intracranial abnormalities. 2. Mild cerebral atrophy with mild chronic microvascular ischemic changes in the cerebral white matter, as above. Electronically Signed   By: Vinnie Langton M.D.   On: 11/30/2019 12:33   CT ANGIO NECK W OR WO CONTRAST  Result Date: 11/30/2019 CLINICAL DATA:  Ataxia EXAM: CT ANGIOGRAPHY HEAD AND NECK CT PERFUSION BRAIN TECHNIQUE: Multidetector CT imaging of the head and neck was performed using the standard protocol during bolus administration of intravenous contrast. Multiplanar CT image reconstructions and MIPs were obtained to evaluate the vascular anatomy. Carotid stenosis measurements (when applicable) are obtained utilizing NASCET criteria, using the distal internal carotid diameter as the denominator. Multiphase CT imaging of the brain was performed following IV bolus contrast injection. Subsequent parametric perfusion maps were calculated using RAPID software. CONTRAST:  148mL OMNIPAQUE IOHEXOL 350 MG/ML SOLN COMPARISON:  Head CT February 22nd 2021 FINDINGS: CTA NECK FINDINGS Aortic arch:, origin of the innominate and left common carotid artery.  Small calcified plaques in the aortic arch. Imaged portion shows no evidence of aneurysm or dissection. No significant stenosis of the major arch vessel origins. Right carotid system: Small calcified plaque in the right carotid bifurcation without hemodynamically significant stenosis. Left carotid system: Small calcified plaques in the left carotid bifurcation without hemodynamically significant stenosis. Vertebral arteries: Calcified plaque at the origin of the right vertebral artery and along the cervical segment of the bilateral vertebral arteries without hemodynamically significant stenosis. Skeleton: Aggressive process seen. Other neck: None. Upper chest: Negative. Review of the MIP images confirms the above findings CTA HEAD FINDINGS Anterior circulation: Calcified plaques are seen in the bilateral carotid siphons extending into the supraclinoid segment on the right side, without hemodynamically significant stenosis. The bilateral ACA and MCA vascular trees are maintained. Incidentally noted is a fenestration at the left A1-A2 junction. Posterior circulation: Calcified plaque is seen at the left V3-V4 junction, without stenosis. Otherwise the bilateral intracranial vertebral arteries, the basilar artery and bilateral posterior cerebral arteries are maintained. Venous sinuses: As permitted by contrast timing, patent. Anatomic variants: Small fenestration at the left A1-A2/ACA. Review of the MIP images confirms the above findings CT Brain Perfusion Findings: ASPECTS: 10 CBF (<30%) Volume: 77mL Perfusion (Tmax>6.0s) volume: 21mL Mismatch Volume: 60mL Infarction Location:Not applicable IMPRESSION: 1. Minimal cervical atherosclerotic disease. No hemodynamically significant stenosis. 2. No large vessel occlusion. 3. No core infarct or significant penumbra. 4. Calcified atherosclerotic plaques in both carotid siphons extending into the supraclinoid segment on the right, without hemodynamically significant stenosis.  Aortic Atherosclerosis (ICD10-I70.0). Electronically Signed   By: Pedro Earls  M.D.   On: 11/30/2019 14:14   MR BRAIN WO CONTRAST  Result Date: 11/30/2019 CLINICAL DATA:  Stroke.  Vertigo.  Ataxia. EXAM: MRI HEAD WITHOUT CONTRAST TECHNIQUE: Multiplanar, multiecho pulse sequences of the brain and surrounding structures were obtained without intravenous contrast. COMPARISON:  CT head 11/30/2019 FINDINGS: Brain: Negative for acute infarct. Scattered small white matter hyperintensities bilaterally compatible with mild chronic microvascular ischemia. Mild atrophy without hydrocephalus. Negative for hemorrhage or mass. Vascular: Normal arterial flow voids Skull and upper cervical spine: No focal skeletal lesion. Sinuses/Orbits: Mild mucosal edema paranasal sinuses. Negative orbit Other: None IMPRESSION: Negative for acute infarct Atrophy and mild chronic microvascular ischemia in the white matter. Electronically Signed   By: Franchot Gallo M.D.   On: 11/30/2019 14:40   CT CEREBRAL PERFUSION W CONTRAST  Result Date: 11/30/2019 CLINICAL DATA:  Ataxia EXAM: CT ANGIOGRAPHY HEAD AND NECK CT PERFUSION BRAIN TECHNIQUE: Multidetector CT imaging of the head and neck was performed using the standard protocol during bolus administration of intravenous contrast. Multiplanar CT image reconstructions and MIPs were obtained to evaluate the vascular anatomy. Carotid stenosis measurements (when applicable) are obtained utilizing NASCET criteria, using the distal internal carotid diameter as the denominator. Multiphase CT imaging of the brain was performed following IV bolus contrast injection. Subsequent parametric perfusion maps were calculated using RAPID software. CONTRAST:  169mL OMNIPAQUE IOHEXOL 350 MG/ML SOLN COMPARISON:  Head CT February 22nd 2021 FINDINGS: CTA NECK FINDINGS Aortic arch:, origin of the innominate and left common carotid artery. Small calcified plaques in the aortic arch. Imaged portion  shows no evidence of aneurysm or dissection. No significant stenosis of the major arch vessel origins. Right carotid system: Small calcified plaque in the right carotid bifurcation without hemodynamically significant stenosis. Left carotid system: Small calcified plaques in the left carotid bifurcation without hemodynamically significant stenosis. Vertebral arteries: Calcified plaque at the origin of the right vertebral artery and along the cervical segment of the bilateral vertebral arteries without hemodynamically significant stenosis. Skeleton: Aggressive process seen. Other neck: None. Upper chest: Negative. Review of the MIP images confirms the above findings CTA HEAD FINDINGS Anterior circulation: Calcified plaques are seen in the bilateral carotid siphons extending into the supraclinoid segment on the right side, without hemodynamically significant stenosis. The bilateral ACA and MCA vascular trees are maintained. Incidentally noted is a fenestration at the left A1-A2 junction. Posterior circulation: Calcified plaque is seen at the left V3-V4 junction, without stenosis. Otherwise the bilateral intracranial vertebral arteries, the basilar artery and bilateral posterior cerebral arteries are maintained. Venous sinuses: As permitted by contrast timing, patent. Anatomic variants: Small fenestration at the left A1-A2/ACA. Review of the MIP images confirms the above findings CT Brain Perfusion Findings: ASPECTS: 10 CBF (<30%) Volume: 44mL Perfusion (Tmax>6.0s) volume: 22mL Mismatch Volume: 72mL Infarction Location:Not applicable IMPRESSION: 1. Minimal cervical atherosclerotic disease. No hemodynamically significant stenosis. 2. No large vessel occlusion. 3. No core infarct or significant penumbra. 4. Calcified atherosclerotic plaques in both carotid siphons extending into the supraclinoid segment on the right, without hemodynamically significant stenosis. Aortic Atherosclerosis (ICD10-I70.0). Electronically Signed    By: Pedro Earls M.D.   On: 11/30/2019 14:14   DG Hip Unilat W or Wo Pelvis 2-3 Views Left  Result Date: 11/30/2019 CLINICAL DATA:  Fall with hip pain EXAM: DG HIP (WITH OR WITHOUT PELVIS) 2-3V LEFT COMPARISON:  None. FINDINGS: Alignment is anatomic. No acute fracture. Joint space is preserved. Residual contrast within the bladder. IMPRESSION: Negative. Electronically Signed   By:  Macy Mis M.D.   On: 11/30/2019 15:09    Procedures Procedures (including critical care time)  Medications Ordered in ED Medications  sodium chloride flush (NS) 0.9 % injection 3 mL (3 mLs Intravenous Given 11/30/19 1522)  iohexol (OMNIPAQUE) 350 MG/ML injection 100 mL (100 mLs Intravenous Contrast Given 11/30/19 1351)  sodium chloride 0.9 % bolus 1,000 mL (1,000 mLs Intravenous Bolus from Bag 11/30/19 1522)  methylPREDNISolone sodium succinate (SOLU-MEDROL) 125 mg/2 mL injection 125 mg (125 mg Intravenous Given 11/30/19 1520)  metoCLOPramide (REGLAN) injection 10 mg (10 mg Intravenous Given 11/30/19 1518)  meclizine (ANTIVERT) tablet 25 mg (25 mg Oral Given 11/30/19 1535)    ED Course  I have reviewed the triage vital signs and the nursing notes.  Pertinent labs & imaging results that were available during my care of the patient were reviewed by me and considered in my medical decision making (see chart for details).  Clinical Course as of Nov 29 1541  Mon Nov 30, 2019  1522 Dr. Rory Percy, neurologist, states patient's neurologic imaging studies are free from acute abnormalities. She should have evaluation and work-up for orthostatic changes with possible PT/OT evaluation. Her symptoms may actually be medication related.  Recommend she stop her trazodone.   [SJ]    Clinical Course User Index [SJ] Desirai Traxler, Helane Gunther, PA-C   MDM Rules/Calculators/A&P                      Patient presents for evaluation following an incident of possible dizziness and fall. Patient is nontoxic appearing, afebrile,  not tachycardic, not tachypneic, not hypotensive, maintains excellent SPO2 on room air, and is in no apparent distress.  She did have some apparent ataxia on my physical exam.  Code stroke was activated. Imaging studies of the head and neck reassuring.  I reviewed and interpreted the patient's labs and radiological studies.  Findings and plan of care discussed with Deno Etienne, DO. Dr. Tyrone Nine personally evaluated and examined this patient.  End of shift patient care handoff report given to [facility], PA-C. Plan: Imaging studies reassuring.  Evaluate patient for orthostatic changes.  PT/OT evaluation.  If patient asymptomatic and ambulatory, discharged with outpatient follow-up.  Otherwise, admit patient.   Final Clinical Impression(s) / ED Diagnoses Final diagnoses:  None    Rx / DC Orders ED Discharge Orders    None       Layla Maw 11/30/19 [place], DO 11/30/19 1554

## 2019-11-30 NOTE — ED Notes (Signed)
Physical therapy currently at bedside working with pt.

## 2019-12-07 ENCOUNTER — Other Ambulatory Visit: Payer: Self-pay | Admitting: Family Medicine

## 2020-02-19 ENCOUNTER — Other Ambulatory Visit: Payer: Self-pay | Admitting: Family Medicine

## 2020-04-12 ENCOUNTER — Encounter (HOSPITAL_COMMUNITY): Payer: Self-pay | Admitting: Emergency Medicine

## 2020-04-12 ENCOUNTER — Other Ambulatory Visit: Payer: Self-pay

## 2020-04-12 ENCOUNTER — Emergency Department (HOSPITAL_COMMUNITY): Payer: PPO

## 2020-04-12 ENCOUNTER — Emergency Department (HOSPITAL_COMMUNITY)
Admission: EM | Admit: 2020-04-12 | Discharge: 2020-04-13 | Disposition: A | Discharge status: Home / Self Care | Payer: PPO | Attending: Emergency Medicine | Admitting: Emergency Medicine

## 2020-04-12 DIAGNOSIS — Z79890 Hormone replacement therapy (postmenopausal): Secondary | ICD-10-CM | POA: Diagnosis not present

## 2020-04-12 DIAGNOSIS — N183 Chronic kidney disease, stage 3 unspecified: Secondary | ICD-10-CM | POA: Diagnosis not present

## 2020-04-12 DIAGNOSIS — E1122 Type 2 diabetes mellitus with diabetic chronic kidney disease: Secondary | ICD-10-CM | POA: Diagnosis not present

## 2020-04-12 DIAGNOSIS — Y999 Unspecified external cause status: Secondary | ICD-10-CM | POA: Diagnosis not present

## 2020-04-12 DIAGNOSIS — M549 Dorsalgia, unspecified: Secondary | ICD-10-CM | POA: Diagnosis not present

## 2020-04-12 DIAGNOSIS — S2242XA Multiple fractures of ribs, left side, initial encounter for closed fracture: Secondary | ICD-10-CM | POA: Diagnosis not present

## 2020-04-12 DIAGNOSIS — W108XXA Fall (on) (from) other stairs and steps, initial encounter: Secondary | ICD-10-CM | POA: Insufficient documentation

## 2020-04-12 DIAGNOSIS — J9811 Atelectasis: Secondary | ICD-10-CM | POA: Diagnosis not present

## 2020-04-12 DIAGNOSIS — Z7984 Long term (current) use of oral hypoglycemic drugs: Secondary | ICD-10-CM | POA: Diagnosis not present

## 2020-04-12 DIAGNOSIS — R0602 Shortness of breath: Secondary | ICD-10-CM | POA: Diagnosis not present

## 2020-04-12 DIAGNOSIS — M542 Cervicalgia: Secondary | ICD-10-CM | POA: Diagnosis not present

## 2020-04-12 DIAGNOSIS — S0990XA Unspecified injury of head, initial encounter: Secondary | ICD-10-CM | POA: Diagnosis not present

## 2020-04-12 DIAGNOSIS — Z7982 Long term (current) use of aspirin: Secondary | ICD-10-CM | POA: Insufficient documentation

## 2020-04-12 DIAGNOSIS — E039 Hypothyroidism, unspecified: Secondary | ICD-10-CM | POA: Diagnosis not present

## 2020-04-12 DIAGNOSIS — E1121 Type 2 diabetes mellitus with diabetic nephropathy: Secondary | ICD-10-CM | POA: Diagnosis not present

## 2020-04-12 DIAGNOSIS — I129 Hypertensive chronic kidney disease with stage 1 through stage 4 chronic kidney disease, or unspecified chronic kidney disease: Secondary | ICD-10-CM | POA: Diagnosis not present

## 2020-04-12 DIAGNOSIS — Y93K1 Activity, walking an animal: Secondary | ICD-10-CM | POA: Insufficient documentation

## 2020-04-12 DIAGNOSIS — Z90710 Acquired absence of both cervix and uterus: Secondary | ICD-10-CM | POA: Diagnosis not present

## 2020-04-12 DIAGNOSIS — S3991XA Unspecified injury of abdomen, initial encounter: Secondary | ICD-10-CM | POA: Diagnosis not present

## 2020-04-12 DIAGNOSIS — Z79899 Other long term (current) drug therapy: Secondary | ICD-10-CM | POA: Insufficient documentation

## 2020-04-12 DIAGNOSIS — Y929 Unspecified place or not applicable: Secondary | ICD-10-CM | POA: Diagnosis not present

## 2020-04-12 DIAGNOSIS — R109 Unspecified abdominal pain: Secondary | ICD-10-CM | POA: Diagnosis not present

## 2020-04-12 DIAGNOSIS — F17210 Nicotine dependence, cigarettes, uncomplicated: Secondary | ICD-10-CM | POA: Diagnosis not present

## 2020-04-12 DIAGNOSIS — J90 Pleural effusion, not elsewhere classified: Secondary | ICD-10-CM | POA: Diagnosis not present

## 2020-04-12 DIAGNOSIS — S199XXA Unspecified injury of neck, initial encounter: Secondary | ICD-10-CM | POA: Diagnosis not present

## 2020-04-12 DIAGNOSIS — S299XXA Unspecified injury of thorax, initial encounter: Secondary | ICD-10-CM | POA: Diagnosis present

## 2020-04-12 DIAGNOSIS — W19XXXA Unspecified fall, initial encounter: Secondary | ICD-10-CM

## 2020-04-12 DIAGNOSIS — I700 Atherosclerosis of aorta: Secondary | ICD-10-CM | POA: Diagnosis not present

## 2020-04-12 DIAGNOSIS — R079 Chest pain, unspecified: Secondary | ICD-10-CM | POA: Diagnosis not present

## 2020-04-12 DIAGNOSIS — R21 Rash and other nonspecific skin eruption: Secondary | ICD-10-CM | POA: Diagnosis not present

## 2020-04-12 LAB — BASIC METABOLIC PANEL
Anion gap: 10 (ref 5–15)
BUN: 27 mg/dL — ABNORMAL HIGH (ref 8–23)
CO2: 24 mmol/L (ref 22–32)
Calcium: 9.4 mg/dL (ref 8.9–10.3)
Chloride: 102 mmol/L (ref 98–111)
Creatinine, Ser: 1.59 mg/dL — ABNORMAL HIGH (ref 0.44–1.00)
GFR calc Af Amer: 37 mL/min — ABNORMAL LOW (ref >60)
GFR calc non Af Amer: 32 mL/min — ABNORMAL LOW (ref >60)
Glucose, Bld: 128 mg/dL — ABNORMAL HIGH (ref 70–99)
Potassium: 4.2 mmol/L (ref 3.5–5.1)
Sodium: 136 mmol/L (ref 135–145)

## 2020-04-12 LAB — SAMPLE TO BLOOD BANK

## 2020-04-12 LAB — CBC
HCT: 39.3 % (ref 36.0–46.0)
Hemoglobin: 12.4 g/dL (ref 12.0–15.0)
MCH: 28.1 pg (ref 26.0–34.0)
MCHC: 31.6 g/dL (ref 30.0–36.0)
MCV: 88.9 fL (ref 80.0–100.0)
Platelets: 283 10*3/uL (ref 150–400)
RBC: 4.42 MIL/uL (ref 3.87–5.11)
RDW: 13.2 % (ref 11.5–15.5)
WBC: 10 10*3/uL (ref 4.0–10.5)
nRBC: 0 % (ref 0.0–0.2)

## 2020-04-12 LAB — PROTIME-INR
INR: 1 (ref 0.8–1.2)
Prothrombin Time: 12.8 seconds (ref 11.4–15.2)

## 2020-04-12 LAB — LACTIC ACID, PLASMA: Lactic Acid, Venous: 1.2 mmol/L (ref 0.5–1.9)

## 2020-04-12 IMAGING — CT CT ABD-PELV W/ CM
4 of 5 series · 14 of 36 positions shown, 15 images · IV contrast (Omnipaque or Isovue)
Comparison: Chest radiograph [DATE]

CLINICAL DATA: Fall down 6 stairs, left-sided pain

EXAM:
CT CHEST, ABDOMEN, AND PELVIS WITH CONTRAST
TECHNIQUE: Multidetector CT imaging of the chest, abdomen and pelvis was
performed following the standard protocol during bolus
administration of intravenous contrast.
CONTRAST:  75mL OMNIPAQUE IOHEXOL 300 MG/ML  SOLN

[Series 2: cap with · axial · 0.68mm/px · z∈[-549,-174]mm · 4 of 125 slices shown, 5 images]
[im 25/125  mediastinal]
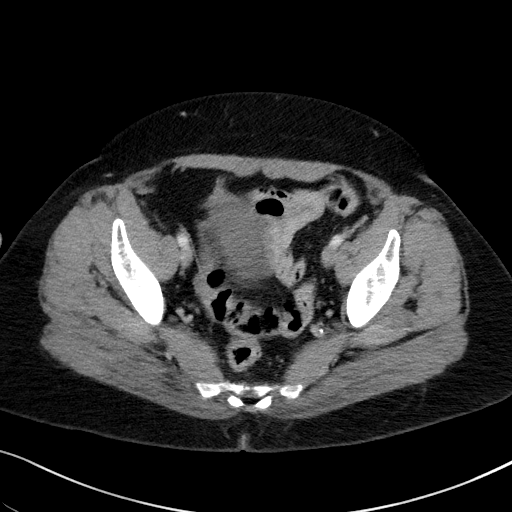
[im 25/125  lung]
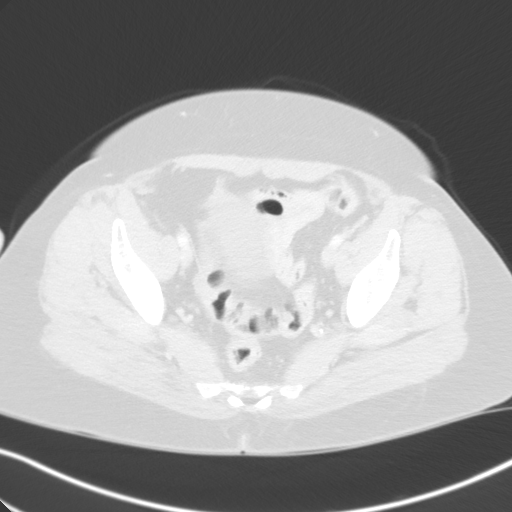
[im 50/125  lung]
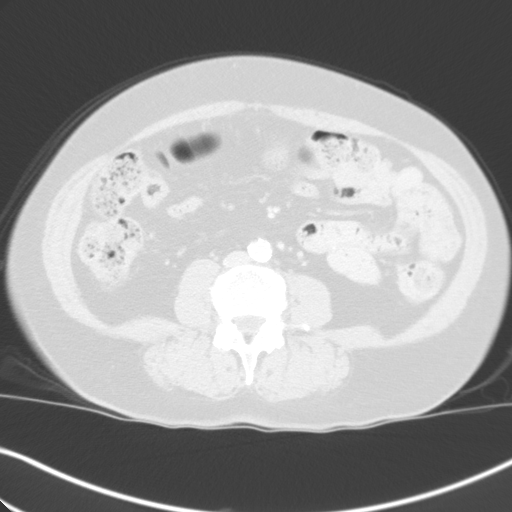
[im 75/125  lung]
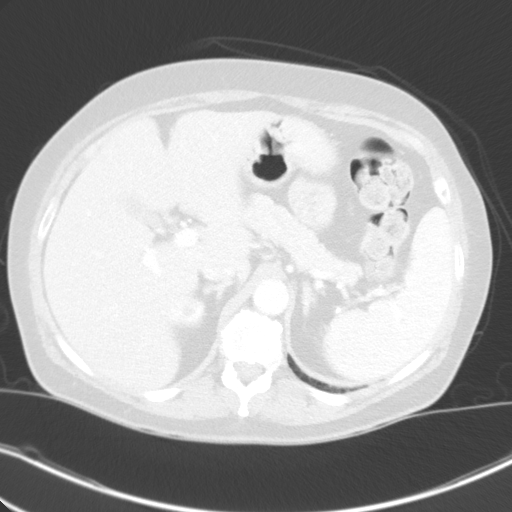
[im 100/125  lung]
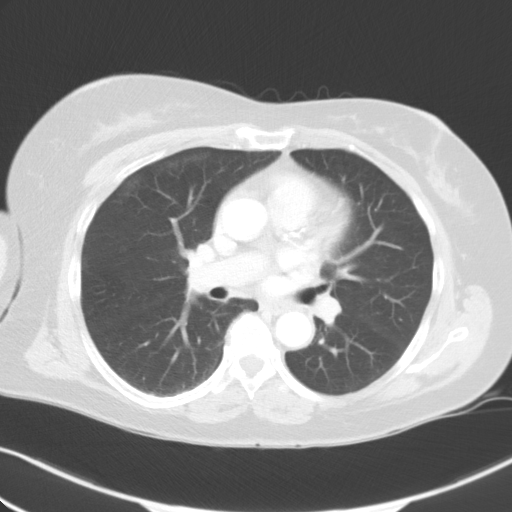

[Series 4: coronals · coronal · 0.82mm/px · 3 of 142 slices shown]
[im 29/142  lung]
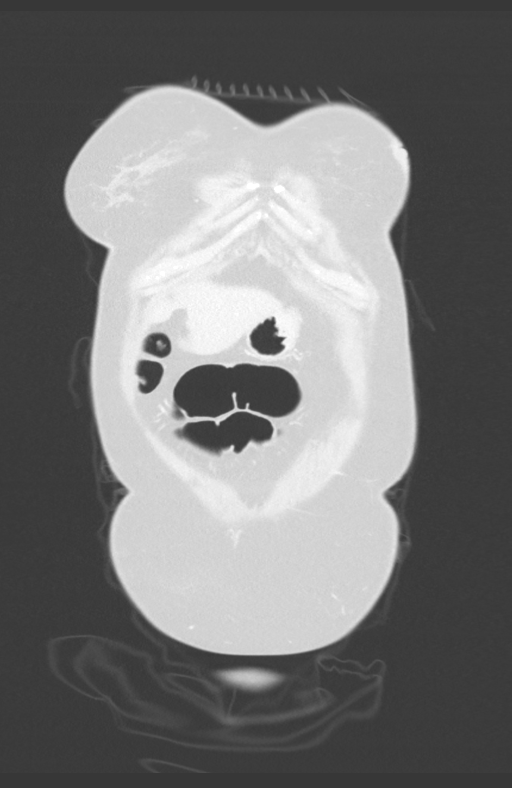
[im 57/142  lung]
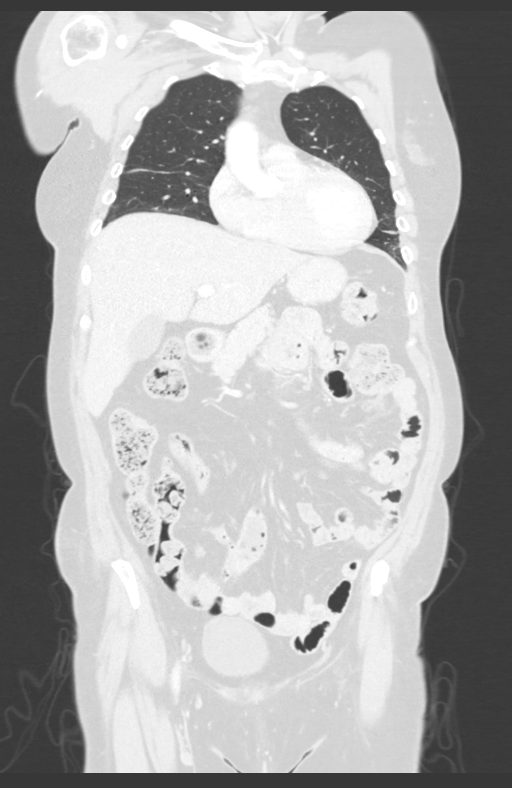
[im 85/142  lung]
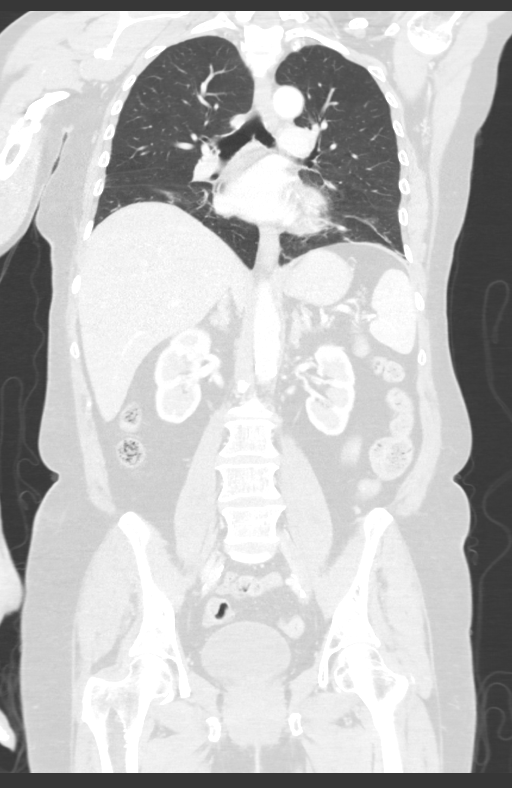

[Series 6: thins · axial · 0.68mm/px · z∈[-284,-190]mm · 4 of 358 slices shown]
[im 23/358  lung]
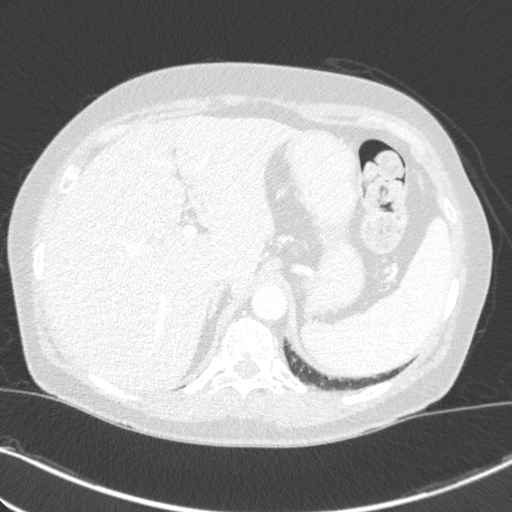
[im 67/358  lung]
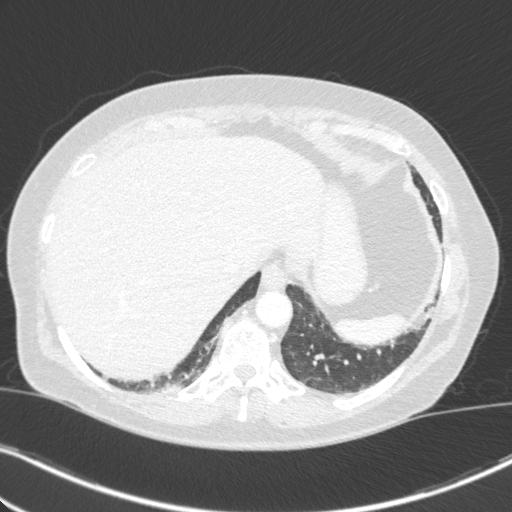
[im 112/358  lung]
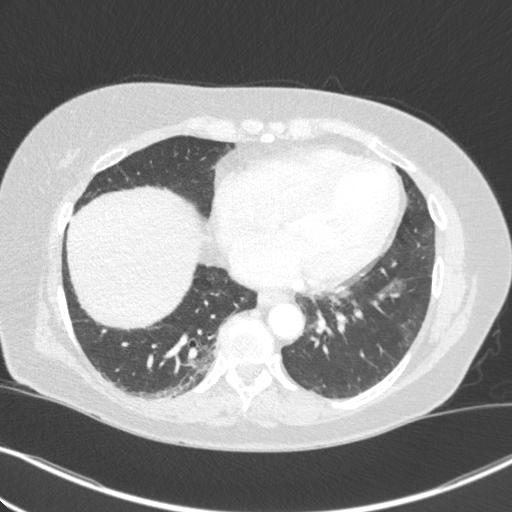
[im 157/358  lung]
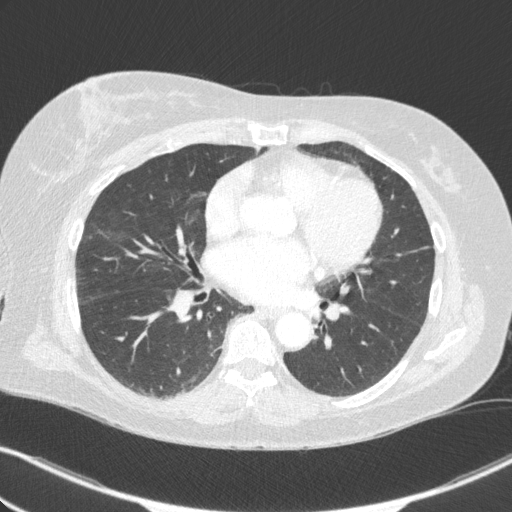

[Series 8: delay · axial · delayed · 0.76mm/px · z∈[-555,-320]mm · 3 of 95 slices shown]
[im 24/95  lung]
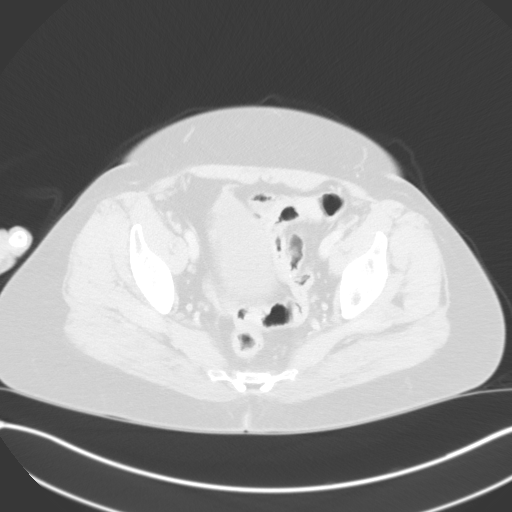
[im 48/95  lung]
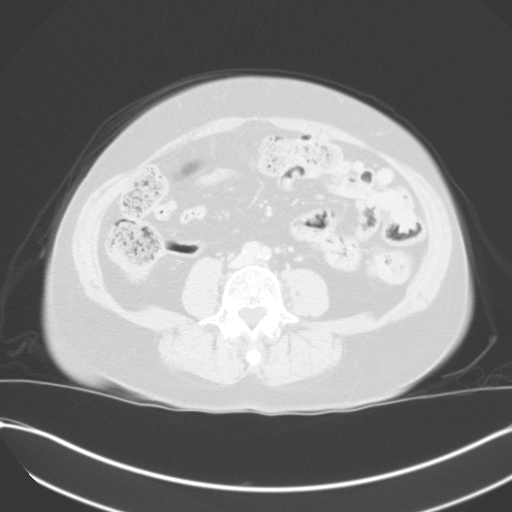
[im 71/95  lung]
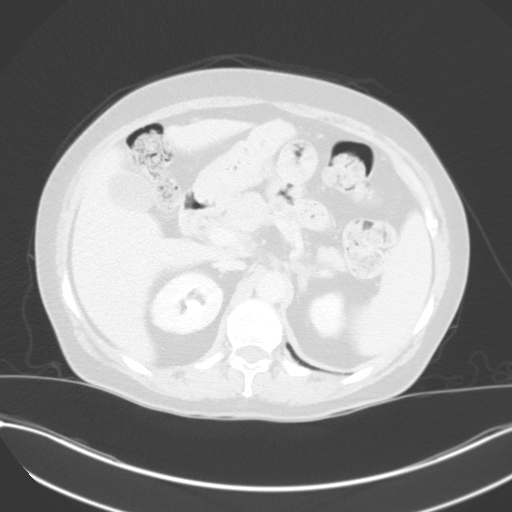

[14 of 36 positions shown; findings below may reference images not displayed]

FINDINGS: CT CHEST FINDINGS

Cardiovascular: The aortic root is suboptimally assessed given
cardiac pulsation artifact. No acute luminal abnormality. No
periaortic stranding or hemorrhage. Atherosclerotic plaque within
the normal caliber aorta. Shared origin of brachiocephalic and left
common carotid artery. Minimal plaque in the proximal great vessels,
otherwise unremarkable. Central pulmonary arteries are normal
caliber. No large central filling with more distal evaluation
limited on this non tailored examination. Normal heart size. No
pericardial effusion. Coronary artery atherosclerosis is present.

Mediastinum/Nodes: No mediastinal fluid or gas. Normal thyroid gland
and thoracic inlet. No acute abnormality of the trachea or
esophagus. No worrisome mediastinal, hilar or axillary adenopathy.

Lungs/Pleura: Dependent atelectatic changes are noted in the lungs.
Small amount of pleural thickening seen adjacent several left
posterior rib fractures, could reflect pleural or subpleural
hemorrhage. No right effusion. No pneumothorax. No other acute
abnormality of the lung parenchyma itself. No consolidation or
features of edema. No suspicious pulmonary nodules or masses.

Musculoskeletal: Minimally displaced fractures of the left posterior
ninth tenth and eleventh ribs. No other acute osseous injury or
suspicious osseous lesions. No large body wall hematoma or other
traumatic soft tissue injury of the chest wall. Multilevel
degenerative changes are present in the imaged portions of the
spine. Mild dextrocurvature of the spine may be positional.

CT ABDOMEN PELVIS FINDINGS

Hepatobiliary: No direct hepatic injury or perihepatic hematoma.
Diffuse hepatic hypoattenuation compatible with hepatic steatosis.
Smooth surface contour. No worrisome lesions. Normal gallbladder and
biliary tree without visible calcified gallstones.

Pancreas: No direct pancreatic injury or ductal disruption. No
inflammation or discernible lesions.

Spleen: No direct splenic injury or perisplenic hematoma. Normal
splenic size. No worrisome lesions.

Adrenals/Urinary Tract: No adrenal hemorrhage or suspicious adrenal
lesions. Kidneys enhance and excrete symmetrically without evidence
of direct renal injury, perinephric hemorrhage or extravasation of
contrast from the collecting system on excretory delayed phase
imaging. Few subcentimeter hypertension foci in both kidneys too
small to fully characterize on CT imaging but statistically likely
benign. No worrisome renal lesions. No urolithiasis or
hydronephrosis. No evidence of direct bladder injury or rupture. No
other acute bladder abnormality.

Stomach/Bowel: Distal esophagus, stomach and duodenal sweep are
unremarkable. No small bowel wall thickening or dilatation. No
evidence of obstruction. The appendix is surgically absent. No
colonic dilatation or wall thickening.

Vascular/Lymphatic: No acute vascular injury or luminal abnormality
in the abdomen or pelvis. No suspicious or enlarged lymph nodes in
the included lymphatic chains.

Reproductive: Uterus is surgically absent. No concerning adnexal
lesions.

Other: Contusive changes noted anterior to the right anterior
superior iliac spine. No traumatic abdominal wall dehiscence. No
large body wall hematoma or extraperitoneal hemorrhage. No free
fluid or free air in the abdomen or pelvis. No evidence of direct
traumatic mesenteric injury.

Musculoskeletal: No acute traumatic osseous injury seen in the
abdomen or pelvis. Bones of the pelvis are intact and congruent.
Proximal femora are normally located. No large intramuscular
hematoma or other acute musculotendinous injuries.
IMPRESSION: 1. Minimally displaced fractures of the left posterior ninth, tenth
and eleventh ribs.
2. Small amount of pleural thickening adjacent to several left
posterior rib fractures, could reflect pleural or subpleural
hemorrhage. No pneumothorax or right effusion.
3. Contusive changes along the anterolateral right hip superficial
to the anterior superior iliac spine.
4. No other acute traumatic injury within the chest, abdomen, or
pelvis.
5. Hepatic steatosis.
6. Prior hysterectomy, appendectomy.
7. Aortic Atherosclerosis ([SI]-[SI]).

## 2020-04-12 IMAGING — CT CT HEAD W/O CM
3 series · 15 of 47 positions shown, 18 images · non-contrast
Comparison: None.

CLINICAL DATA: Fall down stairs

EXAM:
CT HEAD WITHOUT CONTRAST
CT CERVICAL SPINE WITHOUT CONTRAST
TECHNIQUE: Multidetector CT imaging of the head and cervical spine was
performed following the standard protocol without intravenous
contrast. Multiplanar CT image reconstructions of the cervical spine
were also generated.

[Series 2: head w o · axial · 0.45mm/px · z∈[+75,+200]mm · 9 of 31 slices shown, 12 images]
[im 3/31  brain]
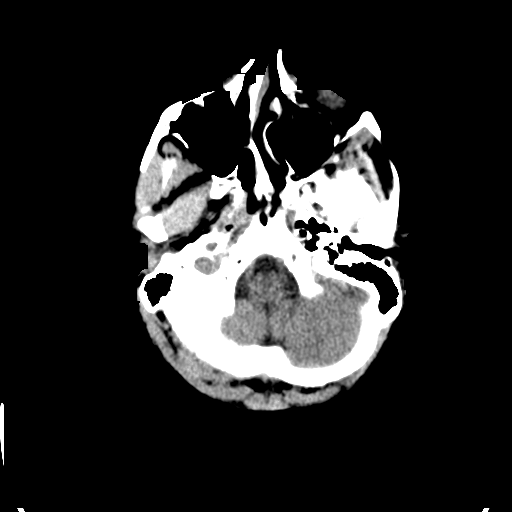
[im 3/31  bone]
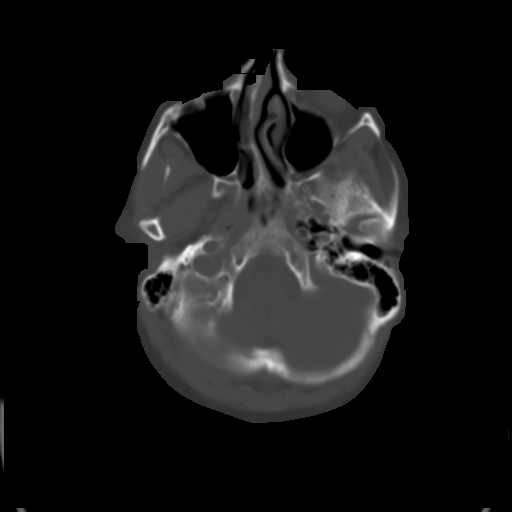
[im 6/31  brain]
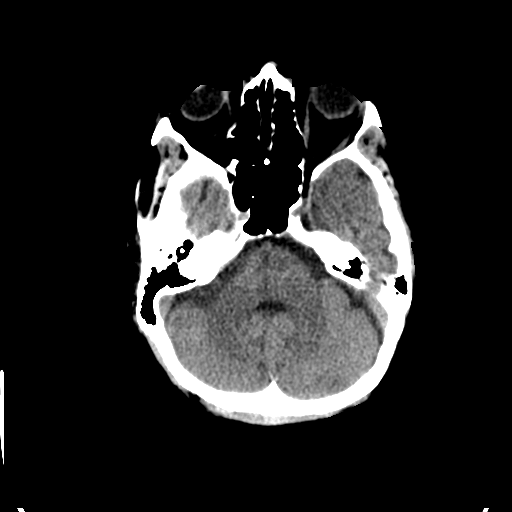
[im 9/31  brain]
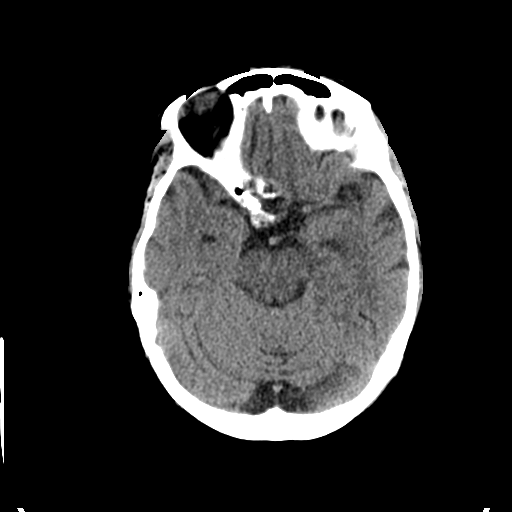
[im 12/31  brain]
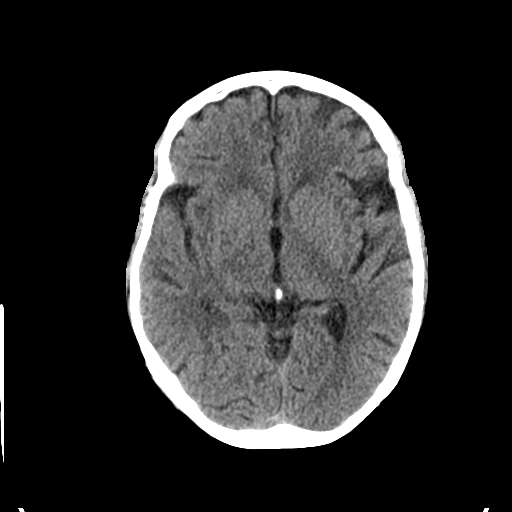
[im 16/31  brain]
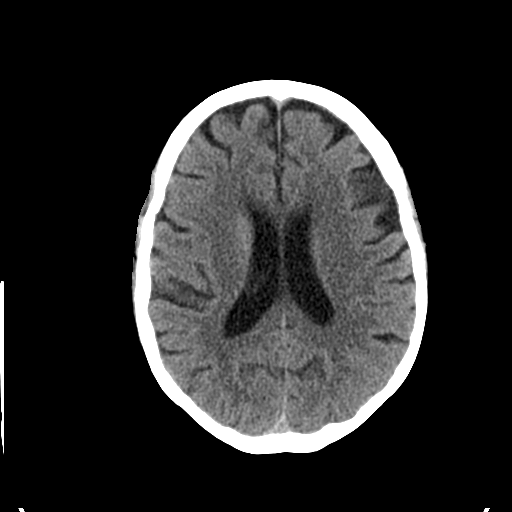
[im 16/31  bone]
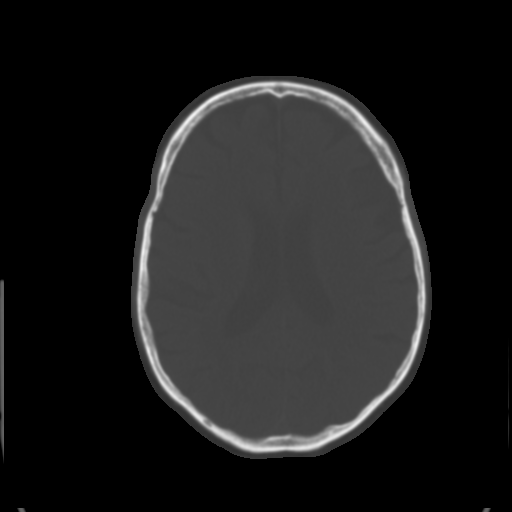
[im 19/31  brain]
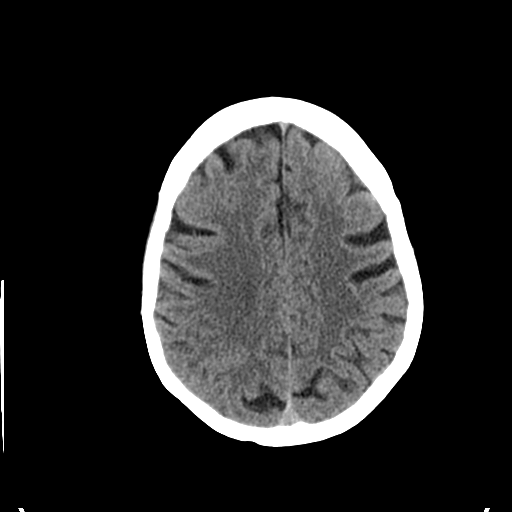
[im 22/31  brain]
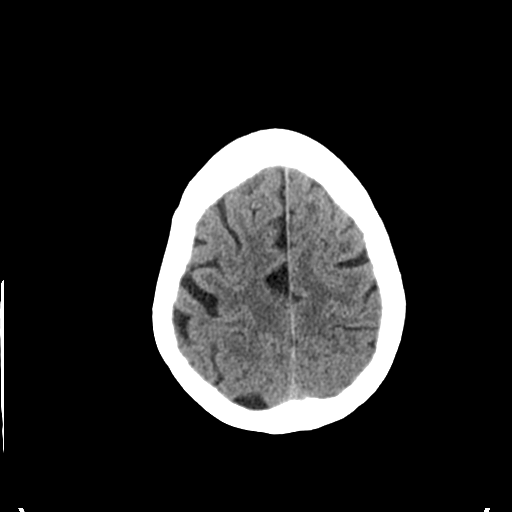
[im 25/31  brain]
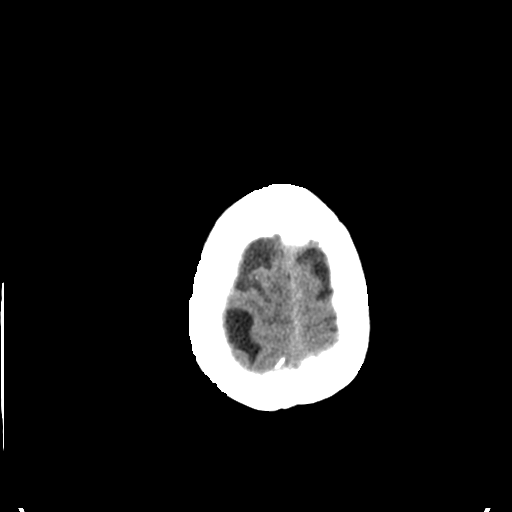
[im 28/31  brain]
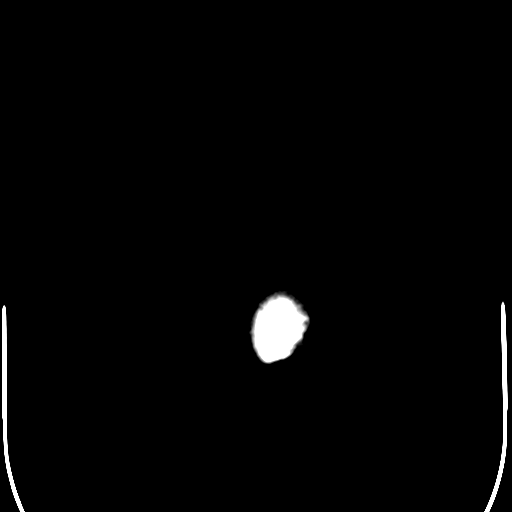
[im 28/31  bone]
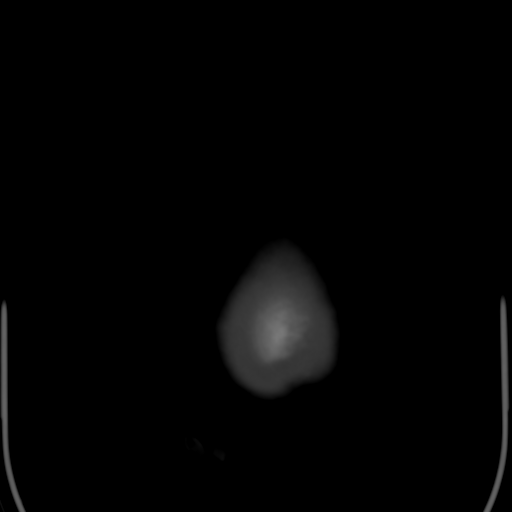

[Series 4: coronal soft · coronal · 0.30mm/px · 3 of 62 slices shown]
[im 21/62  brain]
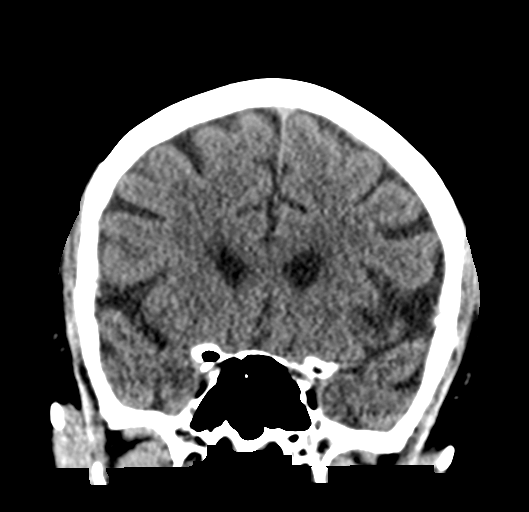
[im 28/62  brain]
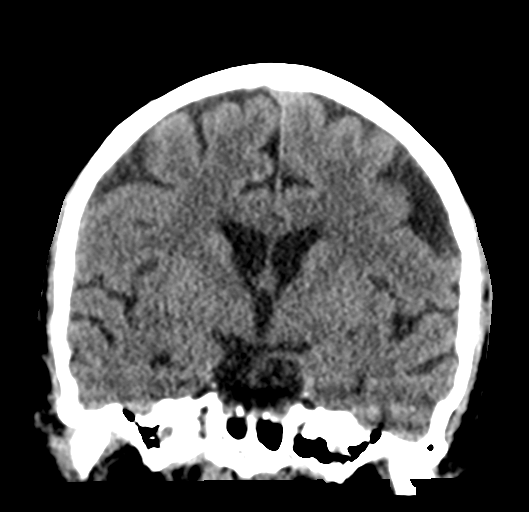
[im 34/62  brain]
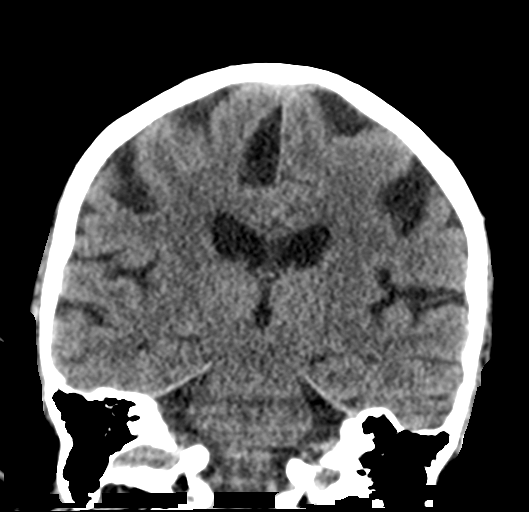

[Series 5: sagittal soft · sagittal · 0.30mm/px · 3 of 51 slices shown]
[im 17/51  brain]
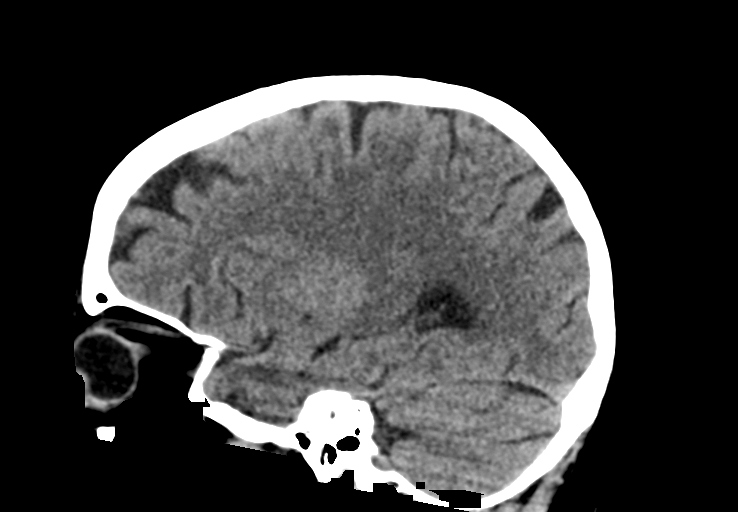
[im 26/51  brain]
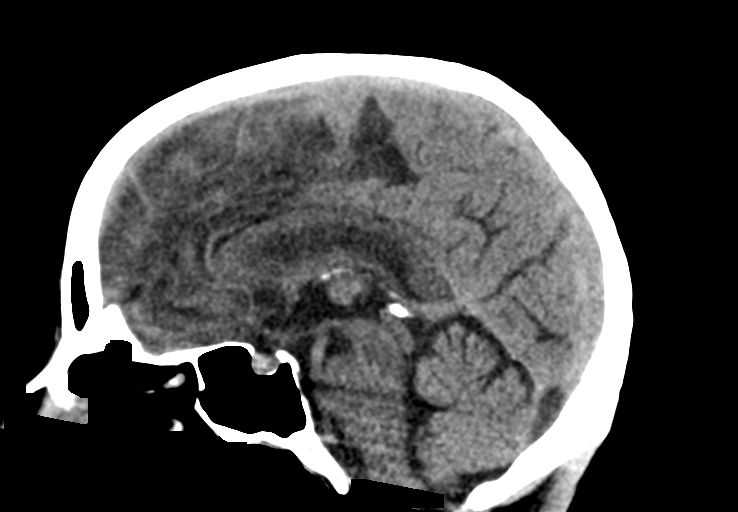
[im 34/51  brain]
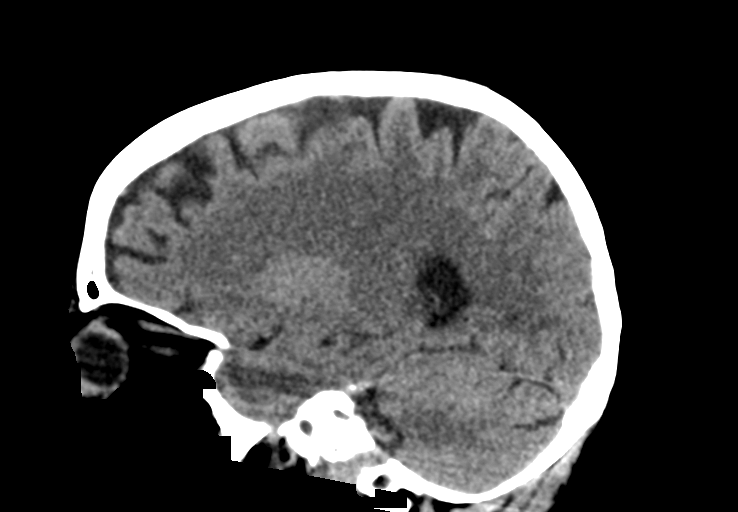

[15 of 47 positions shown; findings below may reference images not displayed]

FINDINGS: CT HEAD FINDINGS

Brain: There is no mass, hemorrhage or extra-axial collection. The
size and configuration of the ventricles and extra-axial CSF spaces
are normal. The brain parenchyma is normal, without evidence of
acute or chronic infarction.

Vascular: No abnormal hyperdensity of the major intracranial
arteries or dural venous sinuses. No intracranial atherosclerosis.

Skull: The visualized skull base, calvarium and extracranial soft
tissues are normal.

Sinuses/Orbits: No fluid levels or advanced mucosal thickening of
the visualized paranasal sinuses. No mastoid or middle ear effusion.
The orbits are normal.

CT CERVICAL SPINE FINDINGS

Alignment: No static subluxation. Facets are aligned. Occipital
condyles are normally positioned.

Skull base and vertebrae: No acute fracture.

Soft tissues and spinal canal: No prevertebral fluid or swelling. No
visible canal hematoma.

Disc levels: No advanced spinal canal or neural foraminal stenosis.

Upper chest: No pneumothorax, pulmonary nodule or pleural effusion.

Other: Normal visualized paraspinal cervical soft tissues.
IMPRESSION: 1. No acute intracranial abnormality.
2. No acute fracture or static subluxation of the cervical spine.

## 2020-04-12 IMAGING — CT CT CERVICAL SPINE W/O CM
3 of 4 series · 13 of 33 positions shown, 16 images · non-contrast
Comparison: None.

CLINICAL DATA: Fall down stairs

EXAM:
CT HEAD WITHOUT CONTRAST
CT CERVICAL SPINE WITHOUT CONTRAST
TECHNIQUE: Multidetector CT imaging of the head and cervical spine was
performed following the standard protocol without intravenous
contrast. Multiplanar CT image reconstructions of the cervical spine
were also generated.

[Series 5: sagittal bone · sagittal · 0.23mm/px · 5 of 61 slices shown, 6 images]
[im 21/61  bone]
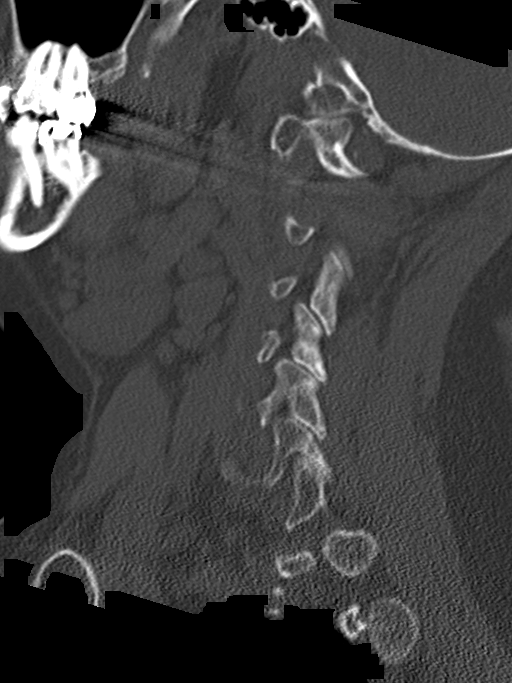
[im 26/61  bone]
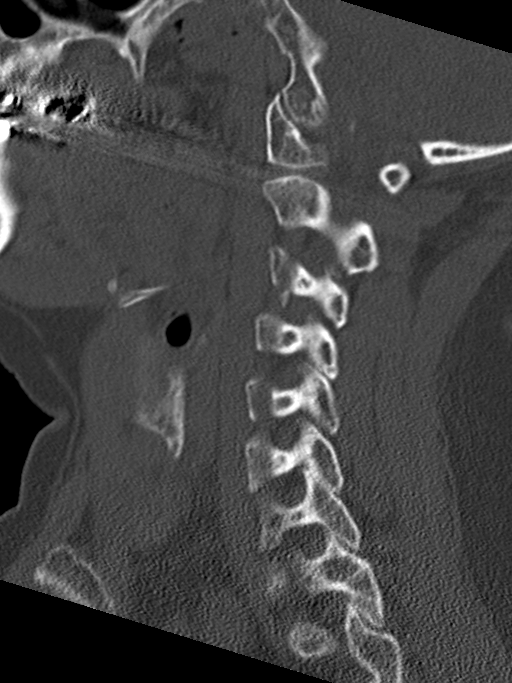
[im 31/61  soft-tissue]
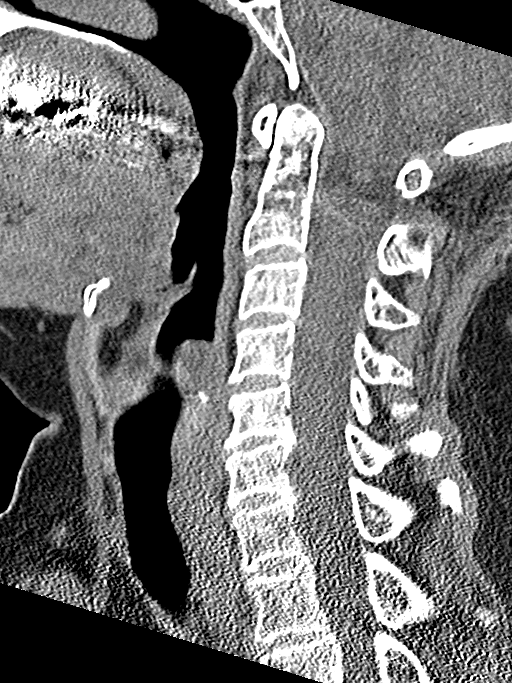
[im 31/61  bone]
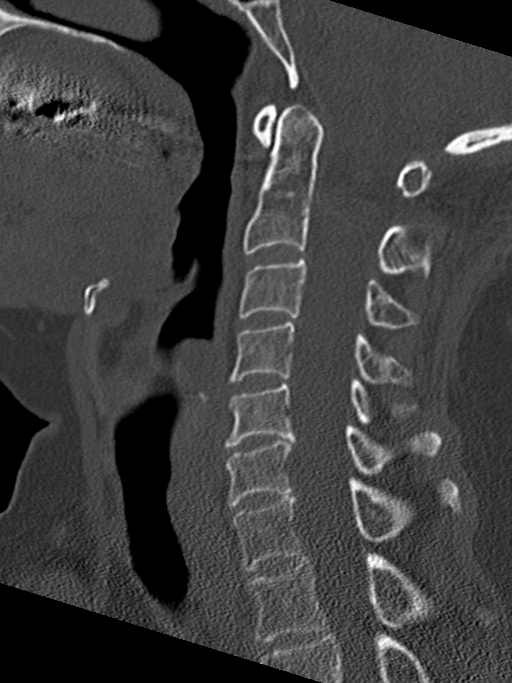
[im 36/61  bone]
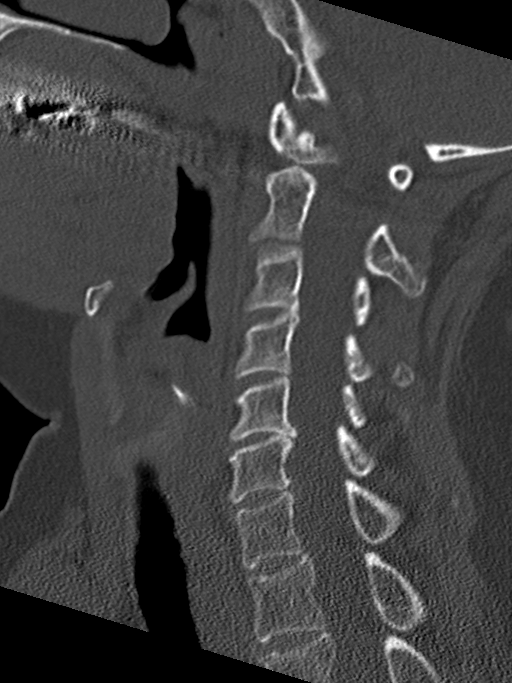
[im 41/61  bone]
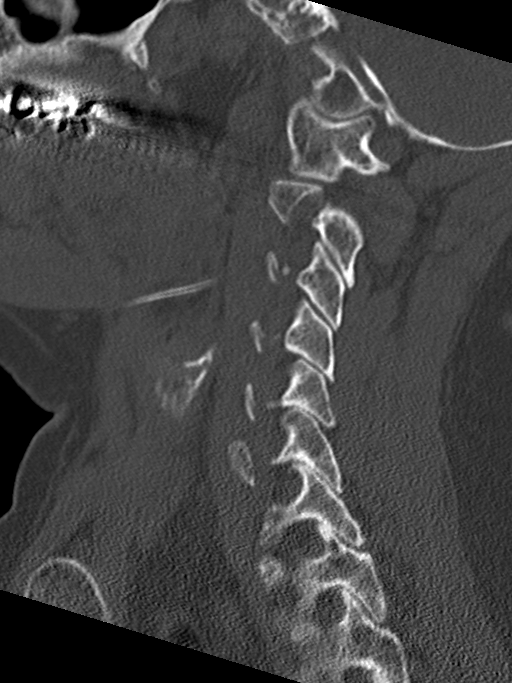

[Series 6: coronal bone · coronal · 0.23mm/px · 3 of 61 slices shown]
[im 13/61  bone]
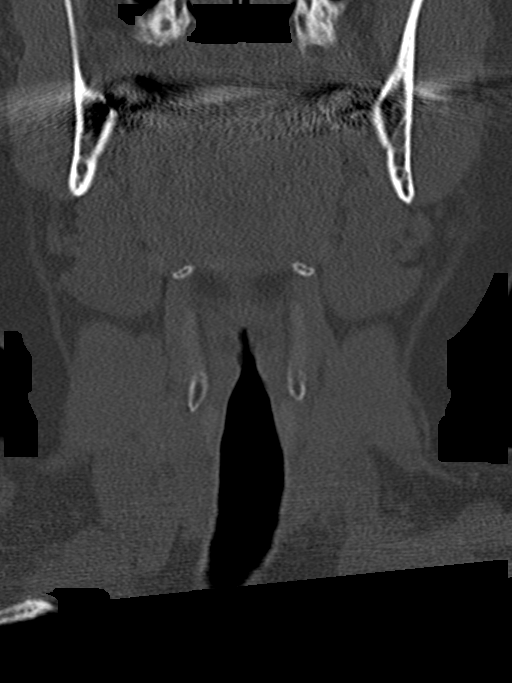
[im 25/61  bone]
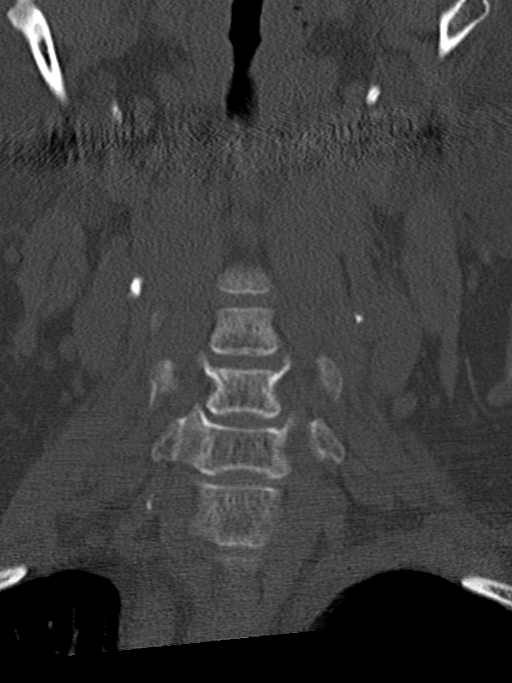
[im 37/61  bone]
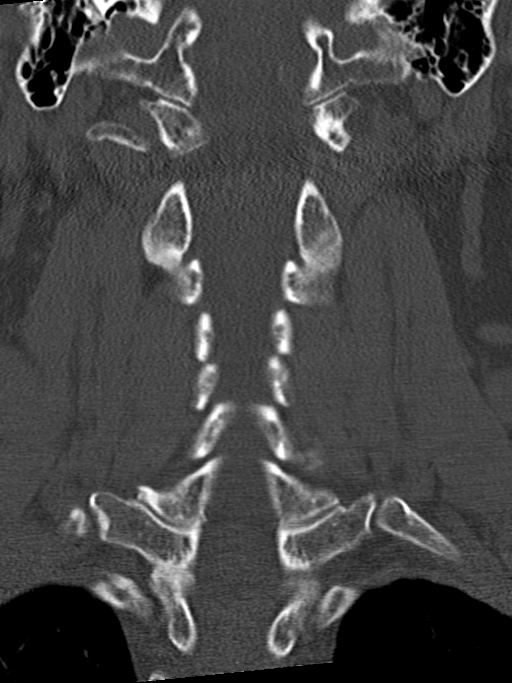

[Series 7: orthogonal axials · axial · 0.21mm/px · z∈[-74,+29]mm · 5 of 79 slices shown, 7 images]
[im 14/79  soft-tissue]
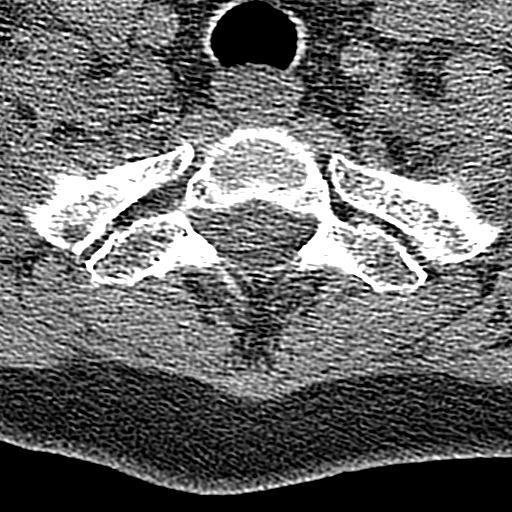
[im 14/79  bone]
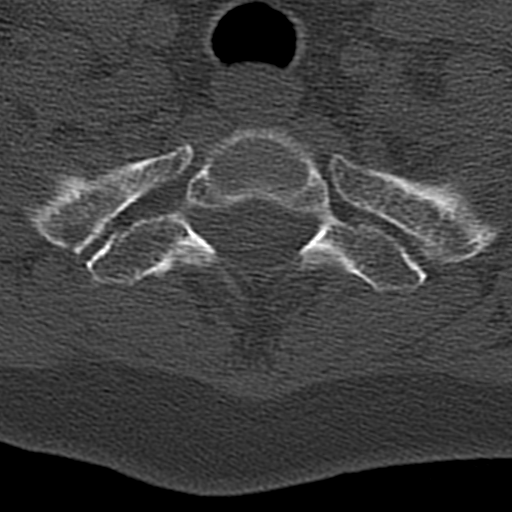
[im 27/79  bone]
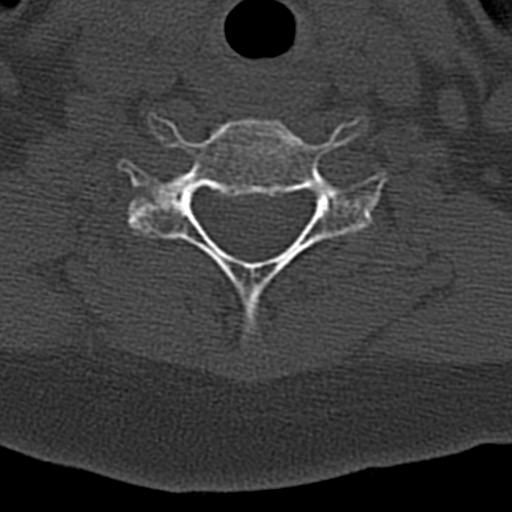
[im 40/79  bone]
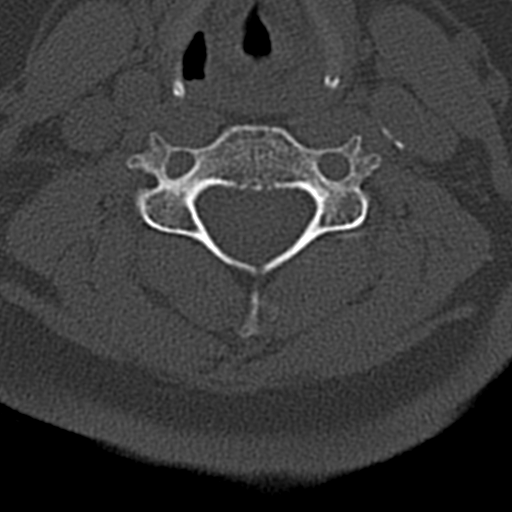
[im 53/79  bone]
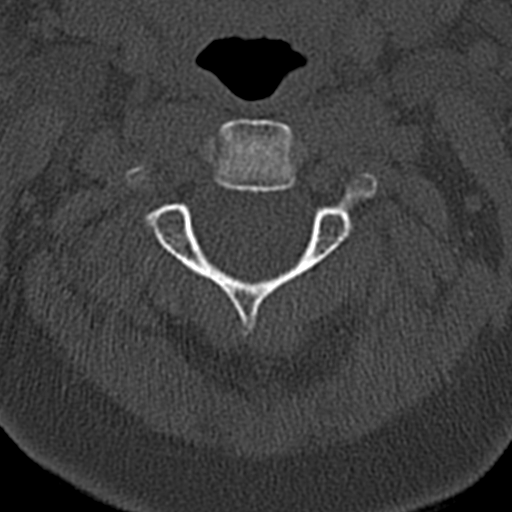
[im 66/79  soft-tissue]
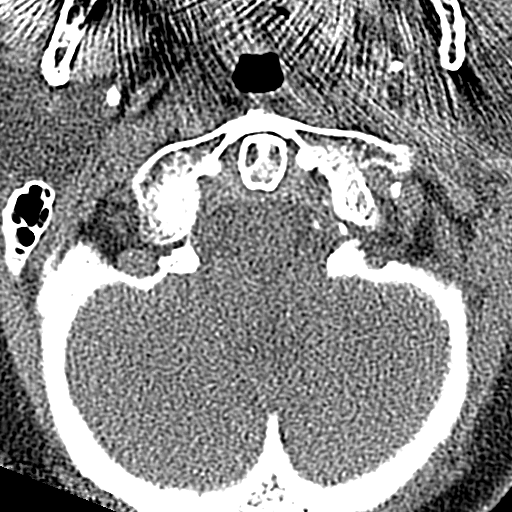
[im 66/79  bone]
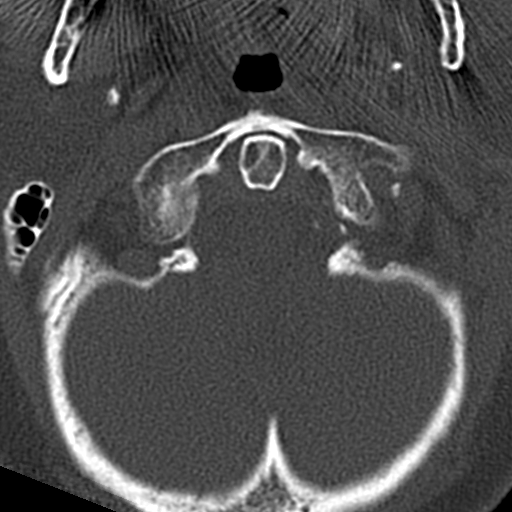

[13 of 33 positions shown; findings below may reference images not displayed]

FINDINGS: CT HEAD FINDINGS

Brain: There is no mass, hemorrhage or extra-axial collection. The
size and configuration of the ventricles and extra-axial CSF spaces
are normal. The brain parenchyma is normal, without evidence of
acute or chronic infarction.

Vascular: No abnormal hyperdensity of the major intracranial
arteries or dural venous sinuses. No intracranial atherosclerosis.

Skull: The visualized skull base, calvarium and extracranial soft
tissues are normal.

Sinuses/Orbits: No fluid levels or advanced mucosal thickening of
the visualized paranasal sinuses. No mastoid or middle ear effusion.
The orbits are normal.

CT CERVICAL SPINE FINDINGS

Alignment: No static subluxation. Facets are aligned. Occipital
condyles are normally positioned.

Skull base and vertebrae: No acute fracture.

Soft tissues and spinal canal: No prevertebral fluid or swelling. No
visible canal hematoma.

Disc levels: No advanced spinal canal or neural foraminal stenosis.

Upper chest: No pneumothorax, pulmonary nodule or pleural effusion.

Other: Normal visualized paraspinal cervical soft tissues.
IMPRESSION: 1. No acute intracranial abnormality.
2. No acute fracture or static subluxation of the cervical spine.

## 2020-04-12 IMAGING — CT CT CHEST W/ CM
3 of 5 series · 13 of 36 positions shown, 15 images · IV contrast (Omnipaque or Isovue)
Comparison: Chest radiograph [DATE]

CLINICAL DATA: Fall down 6 stairs, left-sided pain

EXAM:
CT CHEST, ABDOMEN, AND PELVIS WITH CONTRAST
TECHNIQUE: Multidetector CT imaging of the chest, abdomen and pelvis was
performed following the standard protocol during bolus
administration of intravenous contrast.
CONTRAST:  75mL OMNIPAQUE IOHEXOL 300 MG/ML  SOLN

[Series 2: cap with · axial · 0.68mm/px · z∈[-604,-119]mm · 8 of 125 slices shown, 10 images]
[im 14/125  mediastinal]
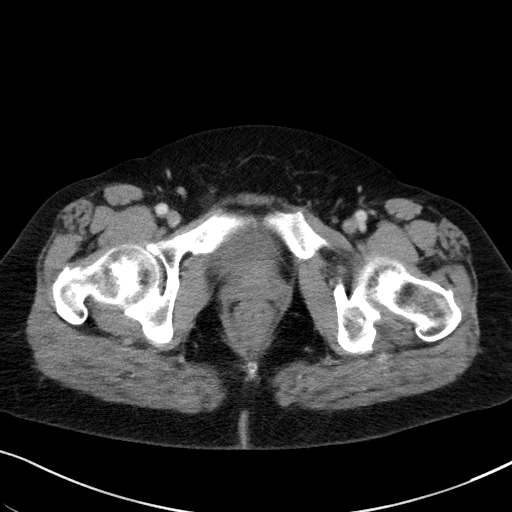
[im 14/125  lung]
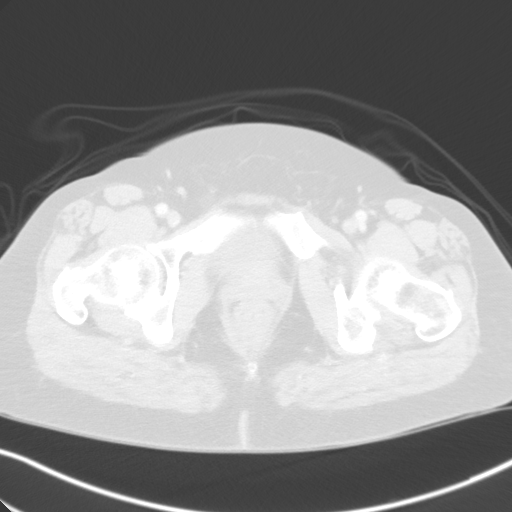
[im 28/125  lung]
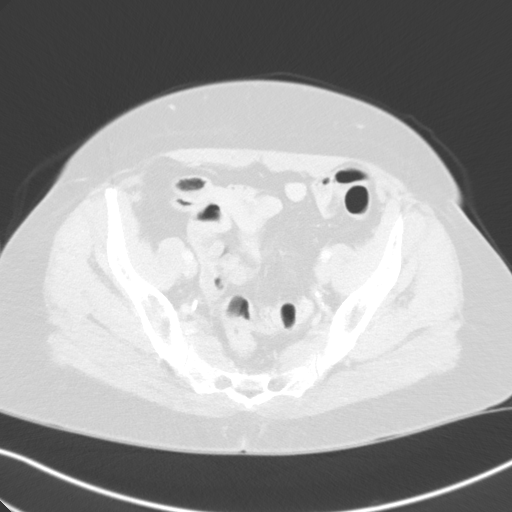
[im 42/125  lung]
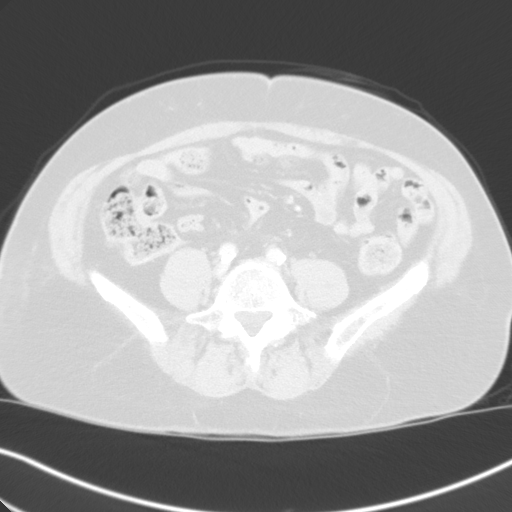
[im 56/125  lung]
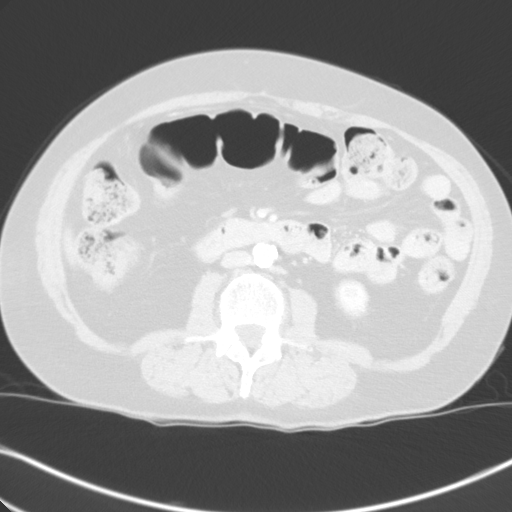
[im 69/125  mediastinal]
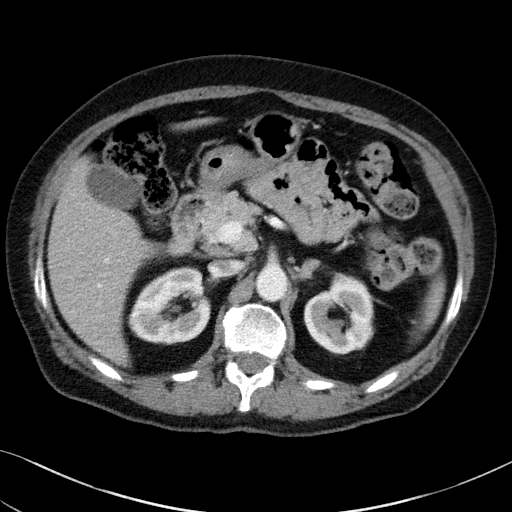
[im 69/125  lung]
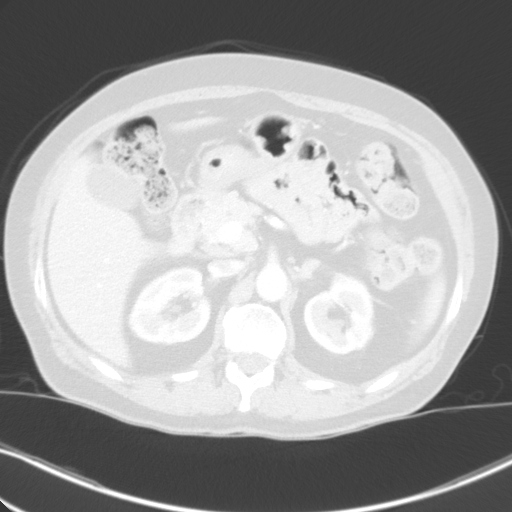
[im 83/125  lung]
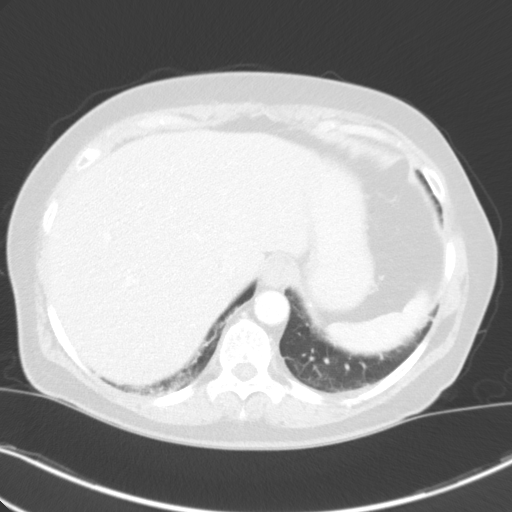
[im 97/125  lung]
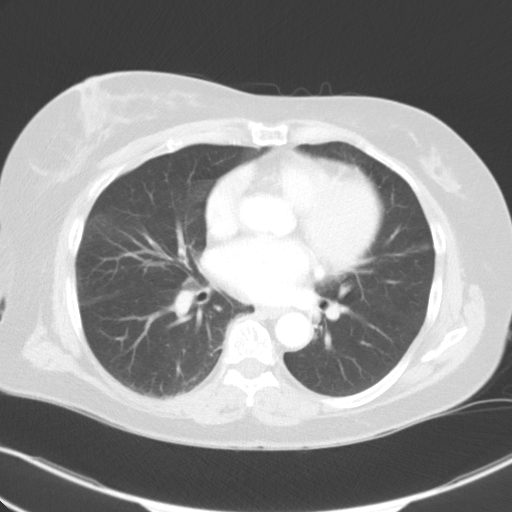
[im 111/125  lung]
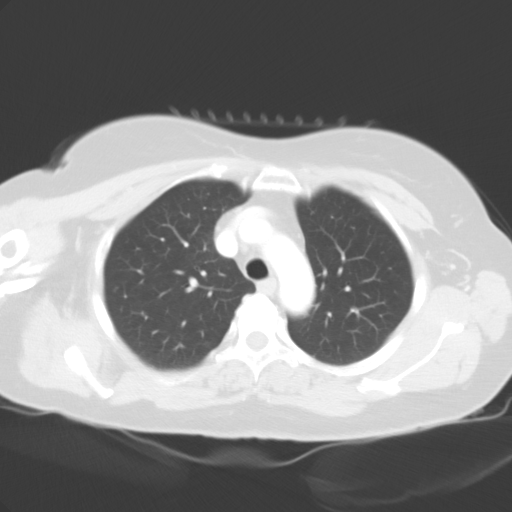

[Series 3: lung · axial · 0.68mm/px · z∈[-273,-245]mm · 2 of 126 slices shown]
[im 14/126  lung]
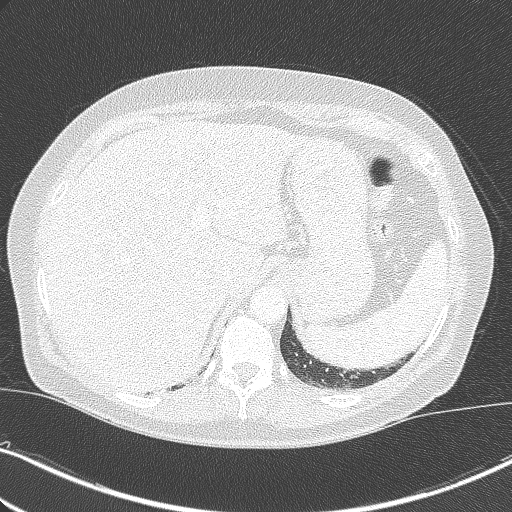
[im 28/126  lung]
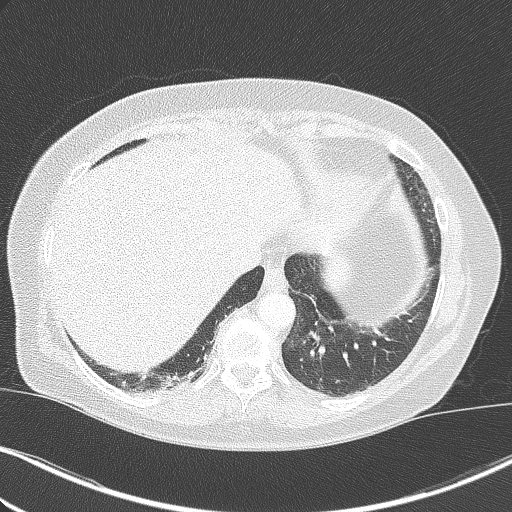

[Series 4: coronals · coronal · 0.82mm/px · 3 of 142 slices shown]
[im 29/142  lung]
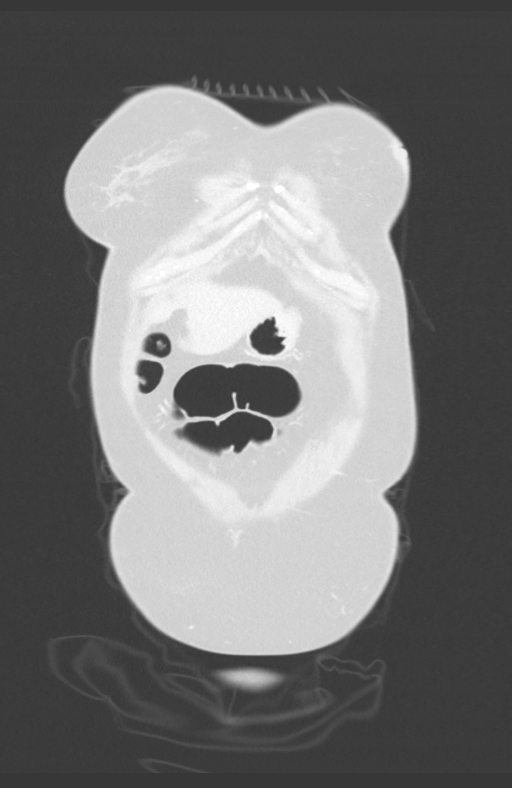
[im 57/142  lung]
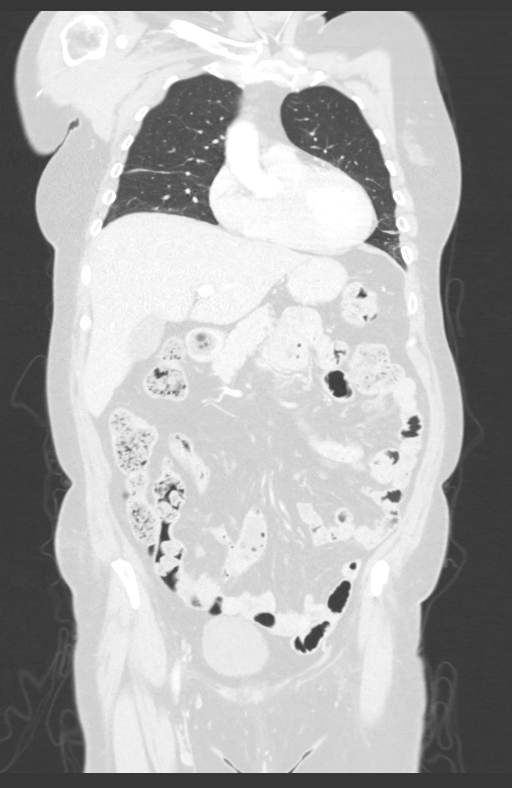
[im 85/142  lung]
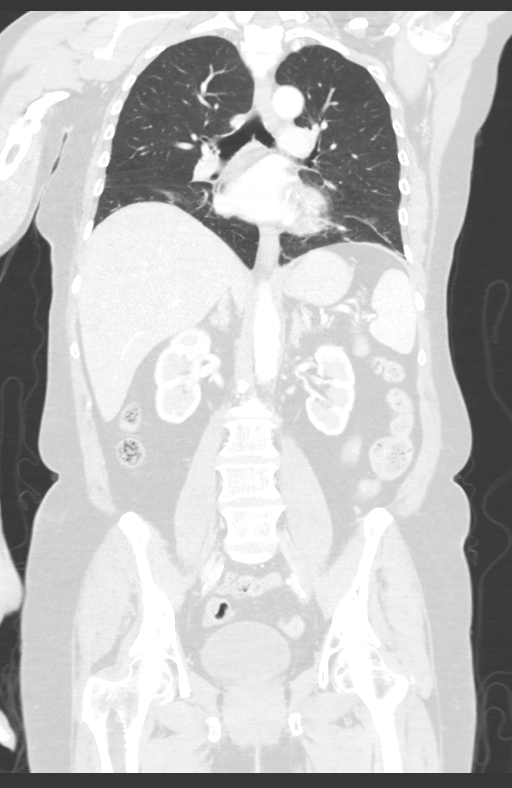

[13 of 36 positions shown; findings below may reference images not displayed]

FINDINGS: CT CHEST FINDINGS

Cardiovascular: The aortic root is suboptimally assessed given
cardiac pulsation artifact. No acute luminal abnormality. No
periaortic stranding or hemorrhage. Atherosclerotic plaque within
the normal caliber aorta. Shared origin of brachiocephalic and left
common carotid artery. Minimal plaque in the proximal great vessels,
otherwise unremarkable. Central pulmonary arteries are normal
caliber. No large central filling with more distal evaluation
limited on this non tailored examination. Normal heart size. No
pericardial effusion. Coronary artery atherosclerosis is present.

Mediastinum/Nodes: No mediastinal fluid or gas. Normal thyroid gland
and thoracic inlet. No acute abnormality of the trachea or
esophagus. No worrisome mediastinal, hilar or axillary adenopathy.

Lungs/Pleura: Dependent atelectatic changes are noted in the lungs.
Small amount of pleural thickening seen adjacent several left
posterior rib fractures, could reflect pleural or subpleural
hemorrhage. No right effusion. No pneumothorax. No other acute
abnormality of the lung parenchyma itself. No consolidation or
features of edema. No suspicious pulmonary nodules or masses.

Musculoskeletal: Minimally displaced fractures of the left posterior
ninth tenth and eleventh ribs. No other acute osseous injury or
suspicious osseous lesions. No large body wall hematoma or other
traumatic soft tissue injury of the chest wall. Multilevel
degenerative changes are present in the imaged portions of the
spine. Mild dextrocurvature of the spine may be positional.

CT ABDOMEN PELVIS FINDINGS

Hepatobiliary: No direct hepatic injury or perihepatic hematoma.
Diffuse hepatic hypoattenuation compatible with hepatic steatosis.
Smooth surface contour. No worrisome lesions. Normal gallbladder and
biliary tree without visible calcified gallstones.

Pancreas: No direct pancreatic injury or ductal disruption. No
inflammation or discernible lesions.

Spleen: No direct splenic injury or perisplenic hematoma. Normal
splenic size. No worrisome lesions.

Adrenals/Urinary Tract: No adrenal hemorrhage or suspicious adrenal
lesions. Kidneys enhance and excrete symmetrically without evidence
of direct renal injury, perinephric hemorrhage or extravasation of
contrast from the collecting system on excretory delayed phase
imaging. Few subcentimeter hypertension foci in both kidneys too
small to fully characterize on CT imaging but statistically likely
benign. No worrisome renal lesions. No urolithiasis or
hydronephrosis. No evidence of direct bladder injury or rupture. No
other acute bladder abnormality.

Stomach/Bowel: Distal esophagus, stomach and duodenal sweep are
unremarkable. No small bowel wall thickening or dilatation. No
evidence of obstruction. The appendix is surgically absent. No
colonic dilatation or wall thickening.

Vascular/Lymphatic: No acute vascular injury or luminal abnormality
in the abdomen or pelvis. No suspicious or enlarged lymph nodes in
the included lymphatic chains.

Reproductive: Uterus is surgically absent. No concerning adnexal
lesions.

Other: Contusive changes noted anterior to the right anterior
superior iliac spine. No traumatic abdominal wall dehiscence. No
large body wall hematoma or extraperitoneal hemorrhage. No free
fluid or free air in the abdomen or pelvis. No evidence of direct
traumatic mesenteric injury.

Musculoskeletal: No acute traumatic osseous injury seen in the
abdomen or pelvis. Bones of the pelvis are intact and congruent.
Proximal femora are normally located. No large intramuscular
hematoma or other acute musculotendinous injuries.
IMPRESSION: 1. Minimally displaced fractures of the left posterior ninth, tenth
and eleventh ribs.
2. Small amount of pleural thickening adjacent to several left
posterior rib fractures, could reflect pleural or subpleural
hemorrhage. No pneumothorax or right effusion.
3. Contusive changes along the anterolateral right hip superficial
to the anterior superior iliac spine.
4. No other acute traumatic injury within the chest, abdomen, or
pelvis.
5. Hepatic steatosis.
6. Prior hysterectomy, appendectomy.
7. Aortic Atherosclerosis ([SI]-[SI]).

## 2020-04-12 IMAGING — DX DG CHEST 2V
2 series · 2 of 2 positions shown · non-contrast
Comparison: Chest radiograph dated [DATE].

CLINICAL DATA: 69-year-old female with fall.

EXAM:
CHEST - 2 VIEW

[chest pa]
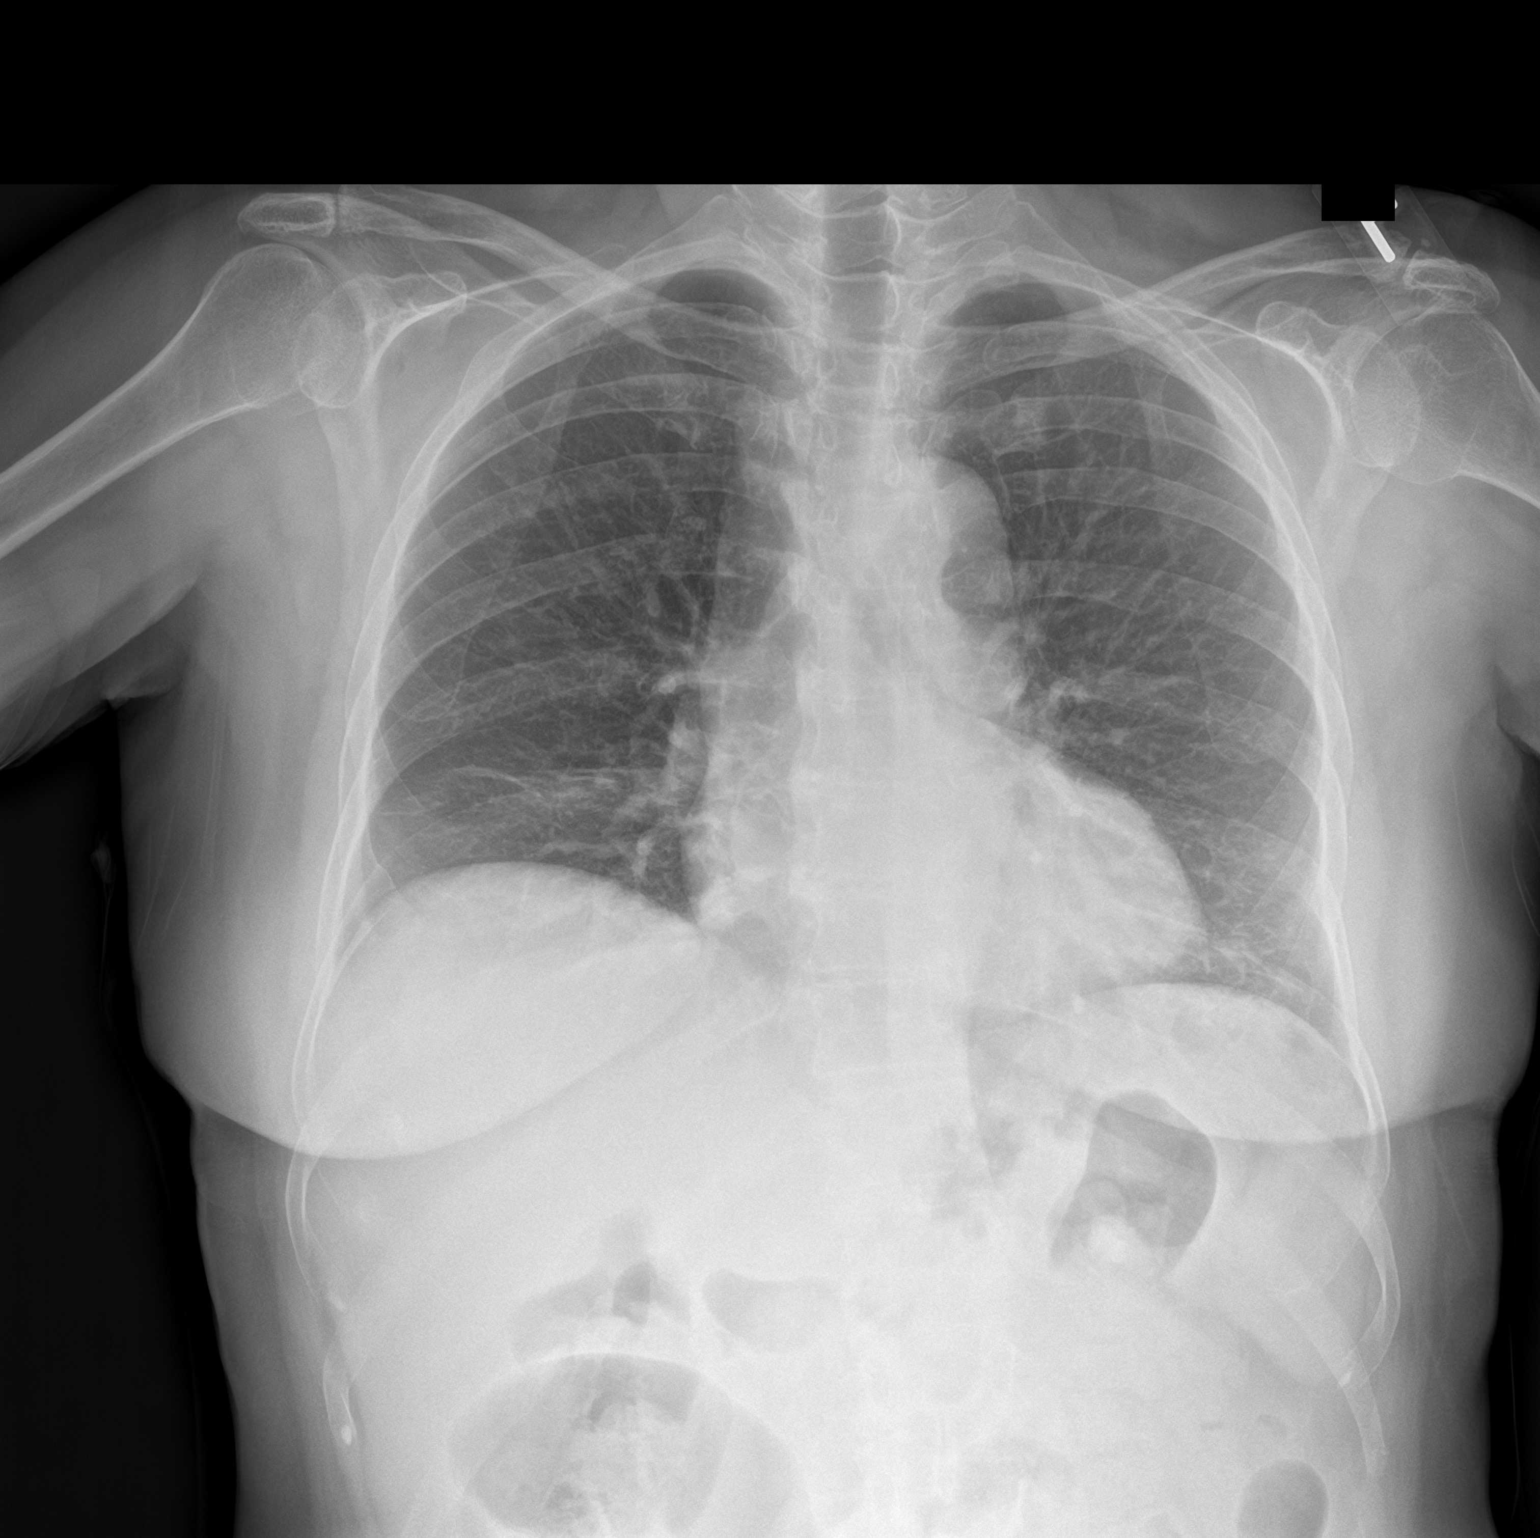

[chest lat]
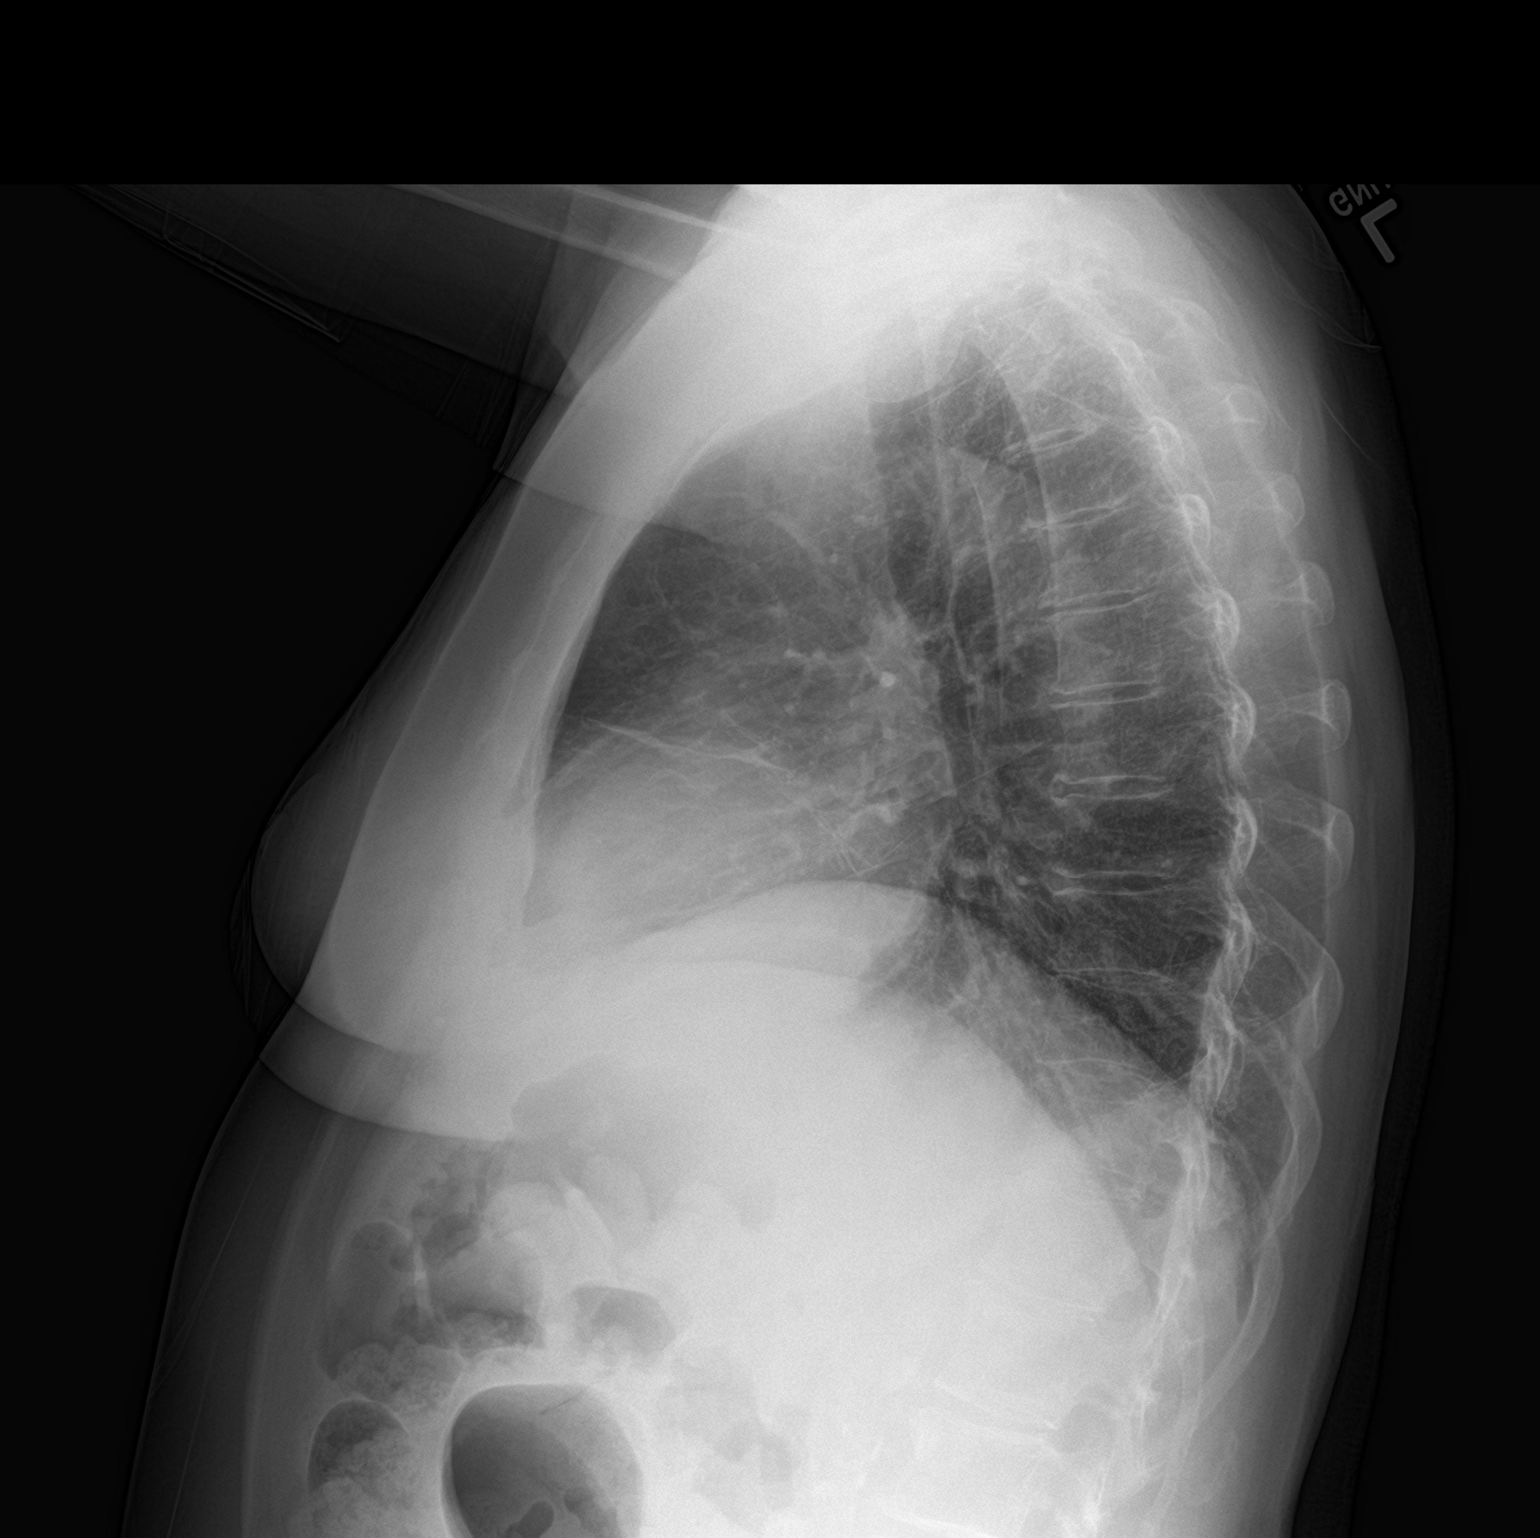

[2 of 2 positions shown; findings below may reference images not displayed]

FINDINGS: Minimal bibasilar atelectasis. No focal consolidation, pleural
effusion, pneumothorax. The cardiac silhouette is within limits. No
acute osseous pathology.
IMPRESSION: No active cardiopulmonary disease.

## 2020-04-12 MED ORDER — IOHEXOL 300 MG/ML  SOLN
75.0000 mL | Freq: Once | INTRAMUSCULAR | Status: AC | PRN
  Administered 2020-04-12: 75 mL via INTRAVENOUS

## 2020-04-12 MED ORDER — SODIUM CHLORIDE 0.9 % IV BOLUS
1000.0000 mL | Freq: Once | INTRAVENOUS | Status: AC
  Administered 2020-04-12: 1000 mL via INTRAVENOUS

## 2020-04-12 MED ORDER — FENTANYL CITRATE (PF) 100 MCG/2ML IJ SOLN
50.0000 ug | Freq: Once | INTRAMUSCULAR | Status: AC
  Administered 2020-04-12: 50 ug via INTRAVENOUS
  Filled 2020-04-12: qty 2

## 2020-04-12 NOTE — ED Triage Notes (Addendum)
Pt fell forward down her back stairs, approx 6 stairs. Pt c/o LEFT side pain and pain across lower back. No LOC.

## 2020-04-12 NOTE — ED Provider Notes (Signed)
  Provider Note MRN:  [id number]  Arrival date & time: 04/13/20    ED Course and Medical Decision Making  Assumed care from Dr. Melina Copa at shift change.  Fall downstairs, awaiting imaging, would be a candidate for discharge with reassuring work-up.  CT imaging reveals 3 rib fractures with possibly a small pleural hematoma but no signs of active bleeding.  On reassessment patient would like to go home.  She has a strong cough and I suspect she will do well with outpatient management.  Providing incentive spirometer, pain regimen.  Return precautions.  Procedures  Final Clinical Impressions(s) / ED Diagnoses     ICD-10-CM   1. Fall, initial encounter  W19.XXXA   2. Closed fracture of multiple ribs of left side, initial encounter  S22.42XA     ED Discharge Orders         Ordered    lidocaine (LIDODERM) 5 %  Every 24 hours     Discontinue  Reprint     04/13/20 0115    oxyCODONE (ROXICODONE) 5 MG immediate release tablet  Every 4 hours PRN     Discontinue  Reprint     04/13/20 0115            Discharge Instructions     You were evaluated in the Emergency Department and after careful evaluation, we did not find any emergent condition requiring admission or further testing in the hospital.  Your exam/testing today is overall reassuring.  Your CT scans showed 3 broken ribs.  We recommend Tylenol 1000 mg every 4-6 hours at home.  We also recommend using the Lidoderm patches for pain during the day.  For pain at night keeping you from sleeping, you can use the oxycodone pills provided.  Please return to the Emergency Department if you experience any worsening of your condition.  We encourage you to follow up with a primary care provider.  Thank you for allowing [place] to be a part of your care.     Barth Kirks. [place] Small, [place] [email or web address]    Maudie Flakes, MD 04/13/20 [id number]

## 2020-04-12 NOTE — ED Provider Notes (Signed)
[facility] Provider Note   CSN: [id number] Arrival date & time: 04/12/20  1807     History Chief Complaint  Patient presents with  . Fall    April Ross is a 69 y.o. female.  She said she was taking her dog out for a walk and they pulled her off the porch down the steps.  She landed mostly on her left side.  Complaining of severe left lateral and posterior chest pain.  Also has pain in her neck and head hit her head but denied any loss of consciousness.  No numbness or weakness.  Pain is worse with deep breath and any kind of movement.  She said she felt some bubbles in her skin on her left side, possibly crepitus.  Not on anticoagulation  The history is provided by the patient.  Fall The current episode started 3 to 5 hours ago. The problem occurs constantly. The problem has been gradually worsening. Associated symptoms include chest pain, abdominal pain and shortness of breath. Pertinent negatives include no headaches. The symptoms are aggravated by bending and twisting. Nothing relieves the symptoms. She has tried nothing for the symptoms. The treatment provided no relief.       Past Medical History:  Diagnosis Date  . Allergy   . Arthritis   . CRD (chronic renal disease)   . Depression   . Diabetes mellitus without complication ([place])   . GERD (gastroesophageal reflux disease)   . Hyperlipidemia   . Hypertension   . LBBB (left bundle branch block)    a. diagnosed in 10/2017 - echo showed a preserved EF of 55-60% and NST was low-risk showing no evidence of ischemia.   . polyps in colon   . Thyroid disease   . Urinary tract infection     Patient Active Problem List   Diagnosis Date Noted  . ETD (Eustachian tube dysfunction), right 08/04/2019  . Chronic bilateral thoracic back pain 11/21/2018  . Type 2 diabetes mellitus with nephropathy ([place]) 10/27/2017  . Tobacco abuse 10/27/2017  . Nonspecific chest pain   . Intermittent lightheadedness  05/16/2017  . Osteoporosis 05/24/2016  . CKD (chronic kidney disease) stage 3, GFR 30-59 ml/min ([facility]) 05/08/2016  . Pure hypercholesterolemia 01/12/2016  . Hypothyroidism 01/12/2016  . Essential hypertension, benign 01/12/2016  . Allergic rhinitis 01/12/2016  . Moderate major depression, single episode ([place]) 01/12/2016  . Chronic insomnia 01/12/2016  . GERD (gastroesophageal reflux disease) 01/12/2016    Past Surgical History:  Procedure Laterality Date  . ABDOMINAL HYSTERECTOMY    . APPENDECTOMY       OB History   No obstetric history on file.     Family History  Problem Relation Age of Onset  . Alzheimer's disease Mother   . Hyperlipidemia Mother   . Hypertension Mother   . Asthma Maternal Grandmother   . Diabetes Maternal Grandfather   . Alcohol abuse Maternal Grandfather   . Hyperlipidemia Maternal Grandfather   . Stroke Maternal Grandfather   . Hypertension Maternal Grandfather   . Diabetes Paternal Grandmother   . Alcohol abuse Maternal Uncle   . Cholecystitis Maternal Uncle   . Arthritis Maternal Uncle     Social History   Tobacco Use  . Smoking status: Current Every Day Smoker    Packs/day: 0.25    Types: Cigarettes  . Smokeless tobacco: Never Used  Vaping Use  . Vaping Use: Never used  Substance Use Topics  . Alcohol use: Yes  Alcohol/week: 0.0 standard drinks    Comment: occ glass of wine or mix drink  . Drug use: No    Home Medications Prior to Admission medications   Medication Sig Start Date End Date Taking? Authorizing Provider  alendronate (FOSAMAX) 70 MG tablet TAKE 1 TABLET BY MOUTH EVERY 7 DAYS WITH A FULL GLASS OF WATER ON AN EMPTY STOMACH 09/23/19   Bedsole, Amy E, MD  aspirin EC 81 MG tablet Take 81 mg by mouth daily.    [provider]  buPROPion (WELLBUTRIN XL) 150 MG 24 hr tablet TAKE 1 TABLET BY MOUTH EVERY DAY 02/19/20   Bedsole, Amy E, MD  Calcium Carb-Cholecalciferol (CALCIUM-VITAMIN D) 500-200 MG-UNIT tablet Take 1  tablet by mouth daily.    [provider]  cetirizine (ZYRTEC) 10 MG tablet Take 10 mg by mouth at bedtime.    [provider]  FLUoxetine (PROZAC) 40 MG capsule TAKE 1 CAPSULE BY MOUTH EVERY DAY 10/28/19   Bedsole, Amy E, MD  fluticasone (FLONASE) 50 MCG/ACT nasal spray SPRAY 2 SPRAYS INTO EACH NOSTRIL EVERY DAY 11/02/19   Bedsole, Amy E, MD  levothyroxine (SYNTHROID) 88 MCG tablet TAKE 1 TABLET BY MOUTH EVERY DAY 09/21/19   Bedsole, Amy E, MD  losartan (COZAAR) 100 MG tablet TAKE 1 TABLET BY MOUTH EVERY DAY 12/07/19   Bedsole, Amy E, MD  metFORMIN (GLUCOPHAGE) 500 MG tablet TAKE 1 TABLET BY MOUTH 2 TIMES DAILY WITH A MEAL. 09/25/19   Bedsole, Amy E, MD  omeprazole (PRILOSEC) 20 MG capsule TAKE 1 CAPSULE BY MOUTH EVERY DAY 09/22/19   Bedsole, Amy E, MD  predniSONE (DELTASONE) 20 MG tablet 3 tabs by mouth daily x 3 days, then 2 tabs by mouth daily x 2 days then 1 tab by mouth daily x 2 days 09/10/19   Bedsole, Amy E, MD  rosuvastatin (CRESTOR) 20 MG tablet TAKE 1 TABLET BY MOUTH EVERY DAY 12/07/19   Bedsole, Amy E, MD  tiZANidine (ZANAFLEX) 4 MG tablet TAKE 1 TABLET BY MOUTH AT BEDTIME AS NEEDED FOR MUSCLE SPASMS. 08/18/19   Bedsole, Amy E, MD  traZODone (DESYREL) 100 MG tablet TAKE 1 TABLET BY MOUTH AT BEDTIME 06/13/19   Bedsole, Amy E, MD    Allergies    Acetaminophen, Asa [aspirin], Ibuprofen, and Other  Review of Systems   Review of Systems  Constitutional: Negative for fever.  HENT: Negative for sore throat.   Eyes: Negative for visual disturbance.  Respiratory: Positive for shortness of breath.   Cardiovascular: Positive for chest pain.  Gastrointestinal: Positive for abdominal pain.  Genitourinary: Negative for dysuria.  Musculoskeletal: Positive for back pain and neck pain.  Skin: Negative for [place].  Neurological: Negative for headaches.    Physical Exam Updated Vital Signs BP (!) 160/82 (BP Location: Right Arm)   Pulse 83   Temp 98.4 F (36.9 C) (Oral)   Resp 20    Ht 5' 2.5" (1.588 m)   Wt 65.8 kg   SpO2 96%   BMI 26.10 kg/m   Physical Exam Vitals and nursing note reviewed.  Constitutional:      General: She is not in acute distress.    Appearance: Normal appearance. She is well-developed.  HENT:     Head: Normocephalic and atraumatic.  Eyes:     Conjunctiva/sclera: Conjunctivae normal.  Cardiovascular:     Rate and Rhythm: Normal rate and regular rhythm.     Heart sounds: No murmur heard.   Pulmonary:  Effort: Pulmonary effort is normal. No respiratory distress.     Breath sounds: Normal breath sounds.  Abdominal:     Palpations: Abdomen is soft.     Tenderness: There is no abdominal tenderness.  Musculoskeletal:        General: Normal range of motion.     Cervical back: Tenderness present.     Comments: She has some midline and left paracervical tenderness.  No thoracic tenderness.  Some lumbar and left paralumbar tenderness.  She is tender over her posterior lateral ribs on the left.  I do not appreciate any crepitus.  Full range of motion of upper and lower extremities without any pain or limitations.  Skin:    General: Skin is warm and dry.     Capillary Refill: Capillary refill takes less than 2 seconds.  Neurological:     General: No focal deficit present.     Mental Status: She is alert.     Sensory: No sensory deficit.     Motor: No weakness.     ED Results / Procedures / Treatments   Labs (all labs ordered are listed, but only abnormal results are displayed) Labs Reviewed  BASIC METABOLIC PANEL - Abnormal; Notable for the following components:      Result Value   Glucose, Bld 128 (*)    BUN 27 (*)    Creatinine, Ser 1.59 (*)    GFR calc non Af Amer 32 (*)    GFR calc Af Amer 37 (*)    All other components within normal limits  CBC  URINALYSIS, ROUTINE W REFLEX MICROSCOPIC  LACTIC ACID, PLASMA  PROTIME-INR  SAMPLE TO BLOOD BANK    EKG None  Radiology DG Chest 2 View  Result Date:  04/12/2020 CLINICAL DATA:  69 year old female with fall. EXAM: CHEST - 2 VIEW COMPARISON:  Chest radiograph dated 10/27/2017. FINDINGS: Minimal bibasilar atelectasis. No focal consolidation, pleural effusion, pneumothorax. The cardiac silhouette is within limits. No acute osseous pathology. IMPRESSION: No active cardiopulmonary disease. Electronically Signed   By: Anner Crete M.D.   On: 04/12/2020 19:49   CT HEAD WO CONTRAST  Result Date: 04/12/2020 CLINICAL DATA:  Fall down stairs EXAM: CT HEAD WITHOUT CONTRAST CT CERVICAL SPINE WITHOUT CONTRAST TECHNIQUE: Multidetector CT imaging of the head and cervical spine was performed following the standard protocol without intravenous contrast. Multiplanar CT image reconstructions of the cervical spine were also generated. COMPARISON:  None. FINDINGS: CT HEAD FINDINGS Brain: There is no mass, hemorrhage or extra-axial collection. The size and configuration of the ventricles and extra-axial CSF spaces are normal. The brain parenchyma is normal, without evidence of acute or chronic infarction. Vascular: No abnormal hyperdensity of the major intracranial arteries or dural venous sinuses. No intracranial atherosclerosis. Skull: The visualized skull base, calvarium and extracranial soft tissues are normal. Sinuses/Orbits: No fluid levels or advanced mucosal thickening of the visualized paranasal sinuses. No mastoid or middle ear effusion. The orbits are normal. CT CERVICAL SPINE FINDINGS Alignment: No static subluxation. Facets are aligned. Occipital condyles are normally positioned. Skull base and vertebrae: No acute fracture. Soft tissues and spinal canal: No prevertebral fluid or swelling. No visible canal hematoma. Disc levels: No advanced spinal canal or neural foraminal stenosis. Upper chest: No pneumothorax, pulmonary nodule or pleural effusion. Other: Normal visualized paraspinal cervical soft tissues. IMPRESSION: 1. No acute intracranial abnormality. 2. No acute  fracture or static subluxation of the cervical spine. Electronically Signed   By: Ulyses Jarred M.D.   On:  04/12/2020 23:47   CT CHEST W CONTRAST  Result Date: 04/12/2020 CLINICAL DATA:  Fall down 6 stairs, left-sided pain EXAM: CT CHEST, ABDOMEN, AND PELVIS WITH CONTRAST TECHNIQUE: Multidetector CT imaging of the chest, abdomen and pelvis was performed following the standard protocol during bolus administration of intravenous contrast. CONTRAST:  76mL OMNIPAQUE IOHEXOL 300 MG/ML  SOLN COMPARISON:  Chest radiograph 04/12/2020 FINDINGS: CT CHEST FINDINGS Cardiovascular: The aortic root is suboptimally assessed given cardiac pulsation artifact. No acute luminal abnormality. No periaortic stranding or hemorrhage. Atherosclerotic plaque within the normal caliber aorta. Shared origin of brachiocephalic and left common carotid artery. Minimal plaque in the proximal great vessels, otherwise unremarkable. Central pulmonary arteries are normal caliber. No large central filling with more distal evaluation limited on this non tailored examination. Normal heart size. No pericardial effusion. Coronary artery atherosclerosis is present. Mediastinum/Nodes: No mediastinal fluid or gas. Normal thyroid gland and thoracic inlet. No acute abnormality of the trachea or esophagus. No worrisome mediastinal, hilar or axillary adenopathy. Lungs/Pleura: Dependent atelectatic changes are noted in the lungs. Small amount of pleural thickening seen adjacent several left posterior rib fractures, could reflect pleural or subpleural hemorrhage. No right effusion. No pneumothorax. No other acute abnormality of the lung parenchyma itself. No consolidation or features of edema. No suspicious pulmonary nodules or masses. Musculoskeletal: Minimally displaced fractures of the left posterior ninth tenth and eleventh ribs. No other acute osseous injury or suspicious osseous lesions. No large body wall hematoma or other traumatic soft tissue injury of  the chest wall. Multilevel degenerative changes are present in the imaged portions of the spine. Mild dextrocurvature of the spine may be positional. CT ABDOMEN PELVIS FINDINGS Hepatobiliary: No direct hepatic injury or perihepatic hematoma. Diffuse hepatic hypoattenuation compatible with hepatic steatosis. Smooth surface contour. No worrisome lesions. Normal gallbladder and biliary tree without visible calcified gallstones. Pancreas: No direct pancreatic injury or ductal disruption. No inflammation or discernible lesions. Spleen: No direct splenic injury or perisplenic hematoma. Normal splenic size. No worrisome lesions. Adrenals/Urinary Tract: No adrenal hemorrhage or suspicious adrenal lesions. Kidneys enhance and excrete symmetrically without evidence of direct renal injury, perinephric hemorrhage or extravasation of contrast from the collecting system on excretory delayed phase imaging. Few subcentimeter hypertension foci in both kidneys too small to fully characterize on CT imaging but statistically likely benign. No worrisome renal lesions. No urolithiasis or hydronephrosis. No evidence of direct bladder injury or rupture. No other acute bladder abnormality. Stomach/Bowel: Distal esophagus, stomach and duodenal sweep are unremarkable. No small bowel wall thickening or dilatation. No evidence of obstruction. The appendix is surgically absent. No colonic dilatation or wall thickening. Vascular/Lymphatic: No acute vascular injury or luminal abnormality in the abdomen or pelvis. No suspicious or enlarged lymph nodes in the included lymphatic chains. Reproductive: Uterus is surgically absent. No concerning adnexal lesions. Other: Contusive changes noted anterior to the right anterior superior iliac spine. No traumatic abdominal wall dehiscence. No large body wall hematoma or extraperitoneal hemorrhage. No free fluid or free air in the abdomen or pelvis. No evidence of direct traumatic mesenteric injury.  Musculoskeletal: No acute traumatic osseous injury seen in the abdomen or pelvis. Bones of the pelvis are intact and congruent. Proximal femora are normally located. No large intramuscular hematoma or other acute musculotendinous injuries. IMPRESSION: 1. Minimally displaced fractures of the left posterior ninth, tenth and eleventh ribs. 2. Small amount of pleural thickening adjacent to several left posterior rib fractures, could reflect pleural or subpleural hemorrhage. No pneumothorax or right effusion. 3.  Contusive changes along the anterolateral right hip superficial to the anterior superior iliac spine. 4. No other acute traumatic injury within the chest, abdomen, or pelvis. 5. Hepatic steatosis. 6. Prior hysterectomy, appendectomy. 7. Aortic Atherosclerosis (ICD10-I70.0). Electronically Signed   By: Lovena Le M.D.   On: 04/12/2020 23:50   CT CERVICAL SPINE WO CONTRAST  Result Date: 04/12/2020 CLINICAL DATA:  Fall down stairs EXAM: CT HEAD WITHOUT CONTRAST CT CERVICAL SPINE WITHOUT CONTRAST TECHNIQUE: Multidetector CT imaging of the head and cervical spine was performed following the standard protocol without intravenous contrast. Multiplanar CT image reconstructions of the cervical spine were also generated. COMPARISON:  None. FINDINGS: CT HEAD FINDINGS Brain: There is no mass, hemorrhage or extra-axial collection. The size and configuration of the ventricles and extra-axial CSF spaces are normal. The brain parenchyma is normal, without evidence of acute or chronic infarction. Vascular: No abnormal hyperdensity of the major intracranial arteries or dural venous sinuses. No intracranial atherosclerosis. Skull: The visualized skull base, calvarium and extracranial soft tissues are normal. Sinuses/Orbits: No fluid levels or advanced mucosal thickening of the visualized paranasal sinuses. No mastoid or middle ear effusion. The orbits are normal. CT CERVICAL SPINE FINDINGS Alignment: No static subluxation.  Facets are aligned. Occipital condyles are normally positioned. Skull base and vertebrae: No acute fracture. Soft tissues and spinal canal: No prevertebral fluid or swelling. No visible canal hematoma. Disc levels: No advanced spinal canal or neural foraminal stenosis. Upper chest: No pneumothorax, pulmonary nodule or pleural effusion. Other: Normal visualized paraspinal cervical soft tissues. IMPRESSION: 1. No acute intracranial abnormality. 2. No acute fracture or static subluxation of the cervical spine. Electronically Signed   By: Ulyses Jarred M.D.   On: 04/12/2020 23:47   CT ABDOMEN PELVIS W CONTRAST  Result Date: 04/12/2020 CLINICAL DATA:  Fall down 6 stairs, left-sided pain EXAM: CT CHEST, ABDOMEN, AND PELVIS WITH CONTRAST TECHNIQUE: Multidetector CT imaging of the chest, abdomen and pelvis was performed following the standard protocol during bolus administration of intravenous contrast. CONTRAST:  66mL OMNIPAQUE IOHEXOL 300 MG/ML  SOLN COMPARISON:  Chest radiograph 04/12/2020 FINDINGS: CT CHEST FINDINGS Cardiovascular: The aortic root is suboptimally assessed given cardiac pulsation artifact. No acute luminal abnormality. No periaortic stranding or hemorrhage. Atherosclerotic plaque within the normal caliber aorta. Shared origin of brachiocephalic and left common carotid artery. Minimal plaque in the proximal great vessels, otherwise unremarkable. Central pulmonary arteries are normal caliber. No large central filling with more distal evaluation limited on this non tailored examination. Normal heart size. No pericardial effusion. Coronary artery atherosclerosis is present. Mediastinum/Nodes: No mediastinal fluid or gas. Normal thyroid gland and thoracic inlet. No acute abnormality of the trachea or esophagus. No worrisome mediastinal, hilar or axillary adenopathy. Lungs/Pleura: Dependent atelectatic changes are noted in the lungs. Small amount of pleural thickening seen adjacent several left posterior  rib fractures, could reflect pleural or subpleural hemorrhage. No right effusion. No pneumothorax. No other acute abnormality of the lung parenchyma itself. No consolidation or features of edema. No suspicious pulmonary nodules or masses. Musculoskeletal: Minimally displaced fractures of the left posterior ninth tenth and eleventh ribs. No other acute osseous injury or suspicious osseous lesions. No large body wall hematoma or other traumatic soft tissue injury of the chest wall. Multilevel degenerative changes are present in the imaged portions of the spine. Mild dextrocurvature of the spine may be positional. CT ABDOMEN PELVIS FINDINGS Hepatobiliary: No direct hepatic injury or perihepatic hematoma. Diffuse hepatic hypoattenuation compatible with hepatic steatosis. Smooth surface contour. No  worrisome lesions. Normal gallbladder and biliary tree without visible calcified gallstones. Pancreas: No direct pancreatic injury or ductal disruption. No inflammation or discernible lesions. Spleen: No direct splenic injury or perisplenic hematoma. Normal splenic size. No worrisome lesions. Adrenals/Urinary Tract: No adrenal hemorrhage or suspicious adrenal lesions. Kidneys enhance and excrete symmetrically without evidence of direct renal injury, perinephric hemorrhage or extravasation of contrast from the collecting system on excretory delayed phase imaging. Few subcentimeter hypertension foci in both kidneys too small to fully characterize on CT imaging but statistically likely benign. No worrisome renal lesions. No urolithiasis or hydronephrosis. No evidence of direct bladder injury or rupture. No other acute bladder abnormality. Stomach/Bowel: Distal esophagus, stomach and duodenal sweep are unremarkable. No small bowel wall thickening or dilatation. No evidence of obstruction. The appendix is surgically absent. No colonic dilatation or wall thickening. Vascular/Lymphatic: No acute vascular injury or luminal abnormality  in the abdomen or pelvis. No suspicious or enlarged lymph nodes in the included lymphatic chains. Reproductive: Uterus is surgically absent. No concerning adnexal lesions. Other: Contusive changes noted anterior to the right anterior superior iliac spine. No traumatic abdominal wall dehiscence. No large body wall hematoma or extraperitoneal hemorrhage. No free fluid or free air in the abdomen or pelvis. No evidence of direct traumatic mesenteric injury. Musculoskeletal: No acute traumatic osseous injury seen in the abdomen or pelvis. Bones of the pelvis are intact and congruent. Proximal femora are normally located. No large intramuscular hematoma or other acute musculotendinous injuries. IMPRESSION: 1. Minimally displaced fractures of the left posterior ninth, tenth and eleventh ribs. 2. Small amount of pleural thickening adjacent to several left posterior rib fractures, could reflect pleural or subpleural hemorrhage. No pneumothorax or right effusion. 3. Contusive changes along the anterolateral right hip superficial to the anterior superior iliac spine. 4. No other acute traumatic injury within the chest, abdomen, or pelvis. 5. Hepatic steatosis. 6. Prior hysterectomy, appendectomy. 7. Aortic Atherosclerosis (ICD10-I70.0). Electronically Signed   By: Lovena Le M.D.   On: 04/12/2020 23:50    Procedures Procedures (including critical care time)  Medications Ordered in ED Medications  fentaNYL (SUBLIMAZE) injection 50 mcg (50 mcg Intravenous Given 04/12/20 2246)  sodium chloride 0.9 % bolus 1,000 mL (0 mLs Intravenous Stopped 04/13/20 0028)  iohexol (OMNIPAQUE) 300 MG/ML solution 75 mL (75 mLs Intravenous Contrast Given 04/12/20 2319)  fentaNYL (SUBLIMAZE) injection 50 mcg (50 mcg Intravenous Given 04/13/20 0045)  oxyCODONE (Oxy IR/ROXICODONE) immediate release tablet 5 mg (5 mg Oral Given 04/13/20 0213)    ED Course  I have reviewed the triage vital signs and the nursing notes.  Pertinent labs & imaging  results that were available during my care of the patient were reviewed by me and considered in my medical decision making (see chart for details).    MDM Rules/Calculators/A&P                         This patient complains of fall with left-sided chest injury, neck pain head pain; this involves an extensive number of treatment Options and is a complaint that carries with it a high risk of complications and Morbidity. The differential includes pneumothorax, rib fractures, pulmonary contusion, intra-abdominal trauma C-spine fracture, intracranial bleed  I ordered, reviewed and interpreted labs, which included CBC with normal white count normal hemoglobin, chemistries with elevated BUN and creatinine slightly worse than baseline, normal lactate negative urinalysis I ordered medication IV pain medicine with improvement in her symptoms fluids I ordered imaging  studies which included chest x-ray CT head cervical spine chest abdomen pelvis and I independently    visualized and interpreted imaging which showed rib fractures Previous records obtained and reviewed in epic, none significant  After the interventions stated above, I reevaluated the patient and found patient to be hemodynamically stable. The results of her CAT scans are pending at time of signout to oncoming provider Dr. Sedonia Small. Plan is to follow-up on results of CT and appropriately disposition.   Final Clinical Impression(s) / ED Diagnoses Final diagnoses:  Fall, initial encounter  Closed fracture of multiple ribs of left side, initial encounter    Rx / DC Orders ED Discharge Orders    None       Hayden Rasmussen, MD 04/13/20 1022

## 2020-04-13 LAB — URINALYSIS, ROUTINE W REFLEX MICROSCOPIC
Bilirubin Urine: NEGATIVE
Glucose, UA: NEGATIVE mg/dL
Hgb urine dipstick: NEGATIVE
Ketones, ur: NEGATIVE mg/dL
Leukocytes,Ua: NEGATIVE
Nitrite: NEGATIVE
Protein, ur: NEGATIVE mg/dL
Specific Gravity, Urine: 1.027 (ref 1.005–1.030)
pH: 7 (ref 5.0–8.0)

## 2020-04-13 MED ORDER — OXYCODONE HCL 5 MG PO TABS
5.0000 mg | ORAL_TABLET | Freq: Once | ORAL | Status: AC
  Administered 2020-04-13: 5 mg via ORAL
  Filled 2020-04-13: qty 1

## 2020-04-13 MED ORDER — OXYCODONE HCL 5 MG PO TABS: 5.0000 mg | ORAL_TABLET | ORAL | 0 refills | Status: DC | PRN

## 2020-04-13 MED ORDER — LIDOCAINE 5 % EX PTCH: 1.0000 | MEDICATED_PATCH | CUTANEOUS | 0 refills | Status: DC

## 2020-04-13 MED ORDER — FENTANYL CITRATE (PF) 100 MCG/2ML IJ SOLN
50.0000 ug | Freq: Once | INTRAMUSCULAR | Status: AC
  Administered 2020-04-13: 50 ug via INTRAVENOUS
  Filled 2020-04-13: qty 2

## 2020-04-13 NOTE — ED Notes (Signed)
Pt ambulatory to waiting room. Pt verbalized understanding of discharge instructions.   

## 2020-04-13 NOTE — Discharge Instructions (Addendum)
You were evaluated in the Emergency Department and after careful evaluation, we did not find any emergent condition requiring admission or further testing in the hospital.  Your exam/testing today is overall reassuring.  Your CT scans showed 3 broken ribs.  We recommend Tylenol 1000 mg every 4-6 hours at home.  We also recommend using the Lidoderm patches for pain during the day.  For pain at night keeping you from sleeping, you can use the oxycodone pills provided.  Please return to the Emergency Department if you experience any worsening of your condition.  We encourage you to follow up with a primary care provider.  Thank you for allowing [place] to be a part of your care.

## 2020-05-16 LAB — HM DIABETES FOOT EXAM

## 2020-05-31 ENCOUNTER — Telehealth: Payer: Self-pay | Admitting: Family Medicine

## 2020-05-31 NOTE — Telephone Encounter (Signed)
Appointment 8/31 Pt aware

## 2020-05-31 NOTE — Telephone Encounter (Signed)
Left message asking pt to call office  °

## 2020-05-31 NOTE — Telephone Encounter (Signed)
Please schedule Diabetes follow up with Dr. Bedsole.  

## 2020-06-07 ENCOUNTER — Encounter: Payer: Self-pay | Admitting: Family Medicine

## 2020-06-07 ENCOUNTER — Ambulatory Visit (INDEPENDENT_AMBULATORY_CARE_PROVIDER_SITE_OTHER): Payer: PPO | Admitting: Family Medicine

## 2020-06-07 ENCOUNTER — Other Ambulatory Visit: Payer: Self-pay

## 2020-06-07 VITALS — BP 188/92 | HR 76 | Temp 97.2°F | Ht 62.25 in | Wt 147.8 lb

## 2020-06-07 DIAGNOSIS — E1121 Type 2 diabetes mellitus with diabetic nephropathy: Secondary | ICD-10-CM

## 2020-06-07 DIAGNOSIS — E7800 Pure hypercholesterolemia, unspecified: Secondary | ICD-10-CM | POA: Diagnosis not present

## 2020-06-07 DIAGNOSIS — R42 Dizziness and giddiness: Secondary | ICD-10-CM

## 2020-06-07 LAB — POCT GLYCOSYLATED HEMOGLOBIN (HGB A1C): Hemoglobin A1C: 6.6 % — AB (ref 4.0–5.6)

## 2020-06-07 NOTE — Assessment & Plan Note (Signed)
Well controlled. Continue current medication.  

## 2020-06-07 NOTE — Progress Notes (Signed)
Chief Complaint  Patient presents with  . Diabetes    f/u    History of Present Illness: HPI   69 year old female presents to have 6 month DM follow up.  Diabetes:  She is using cinnamon, working on healthy eating. On metformin. Lab Results  Component Value Date   HGBA1C 6.6 (A) 06/07/2020  Using medications without difficulties: Hypoglycemic episodes: Hyperglycemic episodes: Feet problems: none Blood Sugars averaging: eye exam within last year:   Cholesterol check on 08/2019 was very well controlled on Crestor.  She has fallen twice in 2021. Reviewed ER visits.  First visit in 11/2019..  Was unsteady on her feet.  Stopped trazodone... this improved her AM dizziness.  Second fall  7/6/2021was accidental... resulted in rib fractures.     This visit occurred during the SARS-CoV-2 public health emergency.  Safety protocols were in place, including screening questions prior to the visit, additional usage of staff PPE, and extensive cleaning of exam room while observing appropriate contact time as indicated for disinfecting solutions.   COVID 19 screen:  No recent travel or known exposure to COVID19 The patient denies respiratory symptoms of COVID 19 at this time. The importance of social distancing was discussed today.     Review of Systems  Constitutional: Negative for chills and fever.  HENT: Negative for congestion and ear pain.   Eyes: Negative for pain and redness.  Respiratory: Negative for cough and shortness of breath.   Cardiovascular: Negative for chest pain, palpitations and leg swelling.  Gastrointestinal: Negative for abdominal pain, blood in stool, constipation, diarrhea, nausea and vomiting.  Genitourinary: Negative for dysuria.  Musculoskeletal: Negative for falls and myalgias.  Skin: Negative for Leyh.  Neurological: Negative for dizziness.  Psychiatric/Behavioral: Negative for depression. The patient is not nervous/anxious.       Past Medical  History:  Diagnosis Date  . Allergy   . Arthritis   . CRD (chronic renal disease)   . Depression   . Diabetes mellitus without complication ([place])   . GERD (gastroesophageal reflux disease)   . Hyperlipidemia   . Hypertension   . LBBB (left bundle branch block)    a. diagnosed in 10/2017 - echo showed a preserved EF of 55-60% and NST was low-risk showing no evidence of ischemia.   . polyps in colon   . Thyroid disease   . Urinary tract infection     reports that she has been smoking cigarettes. She has been smoking about 0.25 packs per day. She has never used smokeless tobacco. She reports current alcohol use. She reports that she does not use drugs.   Current Outpatient Medications:  .  alendronate (FOSAMAX) 70 MG tablet, TAKE 1 TABLET BY MOUTH EVERY 7 DAYS WITH A FULL GLASS OF WATER ON AN EMPTY STOMACH, Disp: 12 tablet, Rfl: 3 .  aspirin EC 81 MG tablet, Take 81 mg by mouth daily., Disp: , Rfl:  .  buPROPion (WELLBUTRIN XL) 150 MG 24 hr tablet, TAKE 1 TABLET BY MOUTH EVERY DAY, Disp: 90 tablet, Rfl: 0 .  Calcium Carb-Cholecalciferol (CALCIUM-VITAMIN D) 500-200 MG-UNIT tablet, Take 1 tablet by mouth daily., Disp: , Rfl:  .  cetirizine (ZYRTEC) 10 MG tablet, Take 10 mg by mouth at bedtime., Disp: , Rfl:  .  FLUoxetine (PROZAC) 40 MG capsule, TAKE 1 CAPSULE BY MOUTH EVERY DAY, Disp: 90 capsule, Rfl: 1 .  fluticasone (FLONASE) 50 MCG/ACT nasal spray, SPRAY 2 SPRAYS INTO EACH NOSTRIL EVERY DAY, Disp: 48 mL, Rfl:  1 .  levothyroxine (SYNTHROID) 88 MCG tablet, TAKE 1 TABLET BY MOUTH EVERY DAY, Disp: 90 tablet, Rfl: 2 .  losartan (COZAAR) 100 MG tablet, TAKE 1 TABLET BY MOUTH EVERY DAY, Disp: 90 tablet, Rfl: 1 .  metFORMIN (GLUCOPHAGE) 500 MG tablet, TAKE 1 TABLET BY MOUTH 2 TIMES DAILY WITH A MEAL., Disp: 180 tablet, Rfl: 0 .  omeprazole (PRILOSEC) 20 MG capsule, TAKE 1 CAPSULE BY MOUTH EVERY DAY, Disp: 90 capsule, Rfl: 3 .  rosuvastatin (CRESTOR) 20 MG tablet, TAKE 1 TABLET BY MOUTH EVERY  DAY, Disp: 90 tablet, Rfl: 1 .  tiZANidine (ZANAFLEX) 4 MG tablet, TAKE 1 TABLET BY MOUTH AT BEDTIME AS NEEDED FOR MUSCLE SPASMS., Disp: 90 tablet, Rfl: 0   Observations/Objective: Blood pressure (!) 188/92, pulse 76, temperature (!) 97.2 F (36.2 C), temperature source Temporal, height 5' 2.25" (1.581 m), weight 147 lb 12 oz (67 kg), SpO2 99 %.  Physical Exam  Diabetic foot exam: Normal inspection No skin breakdown No calluses  Normal DP pulses Normal sensation to light touch and monofilament Nails normal  Assessment and Plan    Discussed COVID19 vaccine side effects and benefits. Strongly encouraged the patient to get the vaccine. Questions answered.  Type 2 diabetes mellitus with nephropathy (HCC) Well controlled. Continue current medication.   Pure hypercholesterolemia Well controlled. Continue current medication.   Dizziness Resolved off of trazodone. Using sleep hygiene for sleep issues.    Eliezer Lofts, MD

## 2020-06-07 NOTE — Patient Instructions (Signed)
Work on drinking water earlier in day. Keep up with healthy eating and regular exercise!

## 2020-06-07 NOTE — Assessment & Plan Note (Signed)
Resolved off of trazodone. Using sleep hygiene for sleep issues.

## 2020-06-08 ENCOUNTER — Other Ambulatory Visit: Payer: Self-pay | Admitting: Family Medicine

## 2020-06-08 DIAGNOSIS — E039 Hypothyroidism, unspecified: Secondary | ICD-10-CM

## 2020-06-08 NOTE — Telephone Encounter (Signed)
Will refill, but let pt know she needs to make appt for thyroid labs soon... was missed at last lab check.

## 2020-06-08 NOTE — Telephone Encounter (Signed)
Left message for April Ross to call office and schedule lab only appointment sometime in the next 3 months to check her thyroid labs.

## 2020-06-08 NOTE — Telephone Encounter (Signed)
Last office visit 06/07/2020 for DM. Last Thyroid labs 08/20/2018.  Refill?

## 2020-06-18 ENCOUNTER — Other Ambulatory Visit: Payer: Self-pay | Admitting: Family Medicine

## 2020-06-23 ENCOUNTER — Other Ambulatory Visit: Payer: Self-pay | Admitting: Family Medicine

## 2020-06-30 ENCOUNTER — Other Ambulatory Visit: Payer: Self-pay | Admitting: Family Medicine

## 2020-07-15 ENCOUNTER — Other Ambulatory Visit: Payer: Self-pay | Admitting: Family Medicine

## 2020-08-22 ENCOUNTER — Other Ambulatory Visit: Payer: Self-pay | Admitting: Family Medicine

## 2020-08-23 ENCOUNTER — Other Ambulatory Visit: Payer: Self-pay | Admitting: Family Medicine

## 2020-09-05 ENCOUNTER — Telehealth: Payer: Self-pay | Admitting: Family Medicine

## 2020-09-05 NOTE — Telephone Encounter (Signed)
Please schedule lab only appointment to come and get her thyroid labs checked.  She is also due for her physical the end of February/first of March if you wanted to go ahead and schedule that as well.  She will need MWV with nurse and CPE with Dr. Diona Browner but she needs to come in soon for her thyroid labs.

## 2020-09-05 NOTE — Telephone Encounter (Signed)
Spoke with patient was willing to schedule her labs and CPE

## 2020-09-08 ENCOUNTER — Other Ambulatory Visit: Payer: Self-pay

## 2020-09-08 ENCOUNTER — Other Ambulatory Visit (INDEPENDENT_AMBULATORY_CARE_PROVIDER_SITE_OTHER): Payer: PPO

## 2020-09-08 DIAGNOSIS — E039 Hypothyroidism, unspecified: Secondary | ICD-10-CM | POA: Diagnosis not present

## 2020-09-08 LAB — TSH: TSH: 2.16 u[IU]/mL (ref 0.35–4.50)

## 2020-09-08 LAB — T3, FREE: T3, Free: 3.1 pg/mL (ref 2.3–4.2)

## 2020-09-08 LAB — T4, FREE: Free T4: 1.25 ng/dL (ref 0.60–1.60)

## 2020-09-08 NOTE — Progress Notes (Signed)
No critical labs need to be addressed urgently. We will discuss labs in detail at upcoming office visit.   

## 2020-09-26 ENCOUNTER — Encounter: Payer: Self-pay | Admitting: Family Medicine

## 2020-09-26 ENCOUNTER — Emergency Department (HOSPITAL_COMMUNITY)
Admission: EM | Admit: 2020-09-26 | Discharge: 2020-09-26 | Disposition: A | Discharge status: Home / Self Care | Payer: PPO | Attending: Emergency Medicine | Admitting: Emergency Medicine

## 2020-09-26 ENCOUNTER — Emergency Department (HOSPITAL_COMMUNITY): Payer: PPO

## 2020-09-26 ENCOUNTER — Encounter (HOSPITAL_COMMUNITY): Payer: Self-pay

## 2020-09-26 ENCOUNTER — Other Ambulatory Visit: Payer: Self-pay

## 2020-09-26 DIAGNOSIS — E113291 Type 2 diabetes mellitus with mild nonproliferative diabetic retinopathy without macular edema, right eye: Secondary | ICD-10-CM | POA: Diagnosis not present

## 2020-09-26 DIAGNOSIS — E039 Hypothyroidism, unspecified: Secondary | ICD-10-CM | POA: Diagnosis not present

## 2020-09-26 DIAGNOSIS — E119 Type 2 diabetes mellitus without complications: Secondary | ICD-10-CM | POA: Diagnosis not present

## 2020-09-26 DIAGNOSIS — H53131 Sudden visual loss, right eye: Secondary | ICD-10-CM | POA: Diagnosis not present

## 2020-09-26 DIAGNOSIS — I10 Essential (primary) hypertension: Secondary | ICD-10-CM | POA: Diagnosis not present

## 2020-09-26 DIAGNOSIS — F17210 Nicotine dependence, cigarettes, uncomplicated: Secondary | ICD-10-CM | POA: Insufficient documentation

## 2020-09-26 DIAGNOSIS — G453 Amaurosis fugax: Secondary | ICD-10-CM | POA: Diagnosis not present

## 2020-09-26 DIAGNOSIS — I129 Hypertensive chronic kidney disease with stage 1 through stage 4 chronic kidney disease, or unspecified chronic kidney disease: Secondary | ICD-10-CM | POA: Diagnosis not present

## 2020-09-26 DIAGNOSIS — N183 Chronic kidney disease, stage 3 unspecified: Secondary | ICD-10-CM | POA: Insufficient documentation

## 2020-09-26 DIAGNOSIS — H5461 Unqualified visual loss, right eye, normal vision left eye: Secondary | ICD-10-CM

## 2020-09-26 DIAGNOSIS — R42 Dizziness and giddiness: Secondary | ICD-10-CM | POA: Diagnosis not present

## 2020-09-26 DIAGNOSIS — H538 Other visual disturbances: Secondary | ICD-10-CM | POA: Diagnosis not present

## 2020-09-26 DIAGNOSIS — H35033 Hypertensive retinopathy, bilateral: Secondary | ICD-10-CM | POA: Diagnosis not present

## 2020-09-26 HISTORY — DX: Age-related osteoporosis without current pathological fracture: M81.0

## 2020-09-26 LAB — CBC WITH DIFFERENTIAL/PLATELET
Abs Immature Granulocytes: 0.02 10*3/uL (ref 0.00–0.07)
Basophils Absolute: 0.1 10*3/uL (ref 0.0–0.1)
Basophils Relative: 1 %
Eosinophils Absolute: 0.4 10*3/uL (ref 0.0–0.5)
Eosinophils Relative: 6 %
HCT: 38.1 % (ref 36.0–46.0)
Hemoglobin: 12.2 g/dL (ref 12.0–15.0)
Immature Granulocytes: 0 %
Lymphocytes Relative: 23 %
Lymphs Abs: 1.6 10*3/uL (ref 0.7–4.0)
MCH: 26.9 pg (ref 26.0–34.0)
MCHC: 32 g/dL (ref 30.0–36.0)
MCV: 83.9 fL (ref 80.0–100.0)
Monocytes Absolute: 0.8 10*3/uL (ref 0.1–1.0)
Monocytes Relative: 12 %
Neutro Abs: 4.1 10*3/uL (ref 1.7–7.7)
Neutrophils Relative %: 58 %
Platelets: 212 10*3/uL (ref 150–400)
RBC: 4.54 MIL/uL (ref 3.87–5.11)
RDW: 14 % (ref 11.5–15.5)
WBC: 6.9 10*3/uL (ref 4.0–10.5)
nRBC: 0 % (ref 0.0–0.2)

## 2020-09-26 LAB — HM DIABETES EYE EXAM

## 2020-09-26 LAB — COMPREHENSIVE METABOLIC PANEL
ALT: 43 U/L (ref 0–44)
AST: 75 U/L — ABNORMAL HIGH (ref 15–41)
Albumin: 4.1 g/dL (ref 3.5–5.0)
Alkaline Phosphatase: 62 U/L (ref 38–126)
Anion gap: 8 (ref 5–15)
BUN: 24 mg/dL — ABNORMAL HIGH (ref 8–23)
CO2: 24 mmol/L (ref 22–32)
Calcium: 9.5 mg/dL (ref 8.9–10.3)
Chloride: 104 mmol/L (ref 98–111)
Creatinine, Ser: 1.36 mg/dL — ABNORMAL HIGH (ref 0.44–1.00)
GFR, Estimated: 42 mL/min — ABNORMAL LOW (ref >60)
Glucose, Bld: 251 mg/dL — ABNORMAL HIGH (ref 70–99)
Potassium: 5.1 mmol/L (ref 3.5–5.1)
Sodium: 136 mmol/L (ref 135–145)
Total Bilirubin: 0.6 mg/dL (ref 0.3–1.2)
Total Protein: 7 g/dL (ref 6.5–8.1)

## 2020-09-26 LAB — PROTIME-INR
INR: 1 (ref 0.8–1.2)
Prothrombin Time: 12.5 seconds (ref 11.4–15.2)

## 2020-09-26 IMAGING — MR MR HEAD W/O CM
11 of 12 series · 41 of 48 positions shown · non-contrast
Comparison: Head CT [DATE].  Brain MRI [DATE].

CLINICAL DATA: Stroke, follow-up. Additional history provided:
Weakness and dizziness for 1 day.

EXAM:
MRI HEAD WITHOUT CONTRAST
TECHNIQUE: Multiplanar, multiecho pulse sequences of the brain and surrounding
structures were obtained without intravenous contrast.

[Series 5: DWI · axial · 4.0mm · 0.88mm/px · z∈[-70,+69]mm · 4 of 36 slices shown (1 of 6)]
[im 1/36]
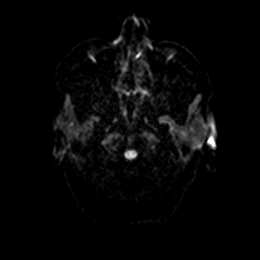
[im 12/36]
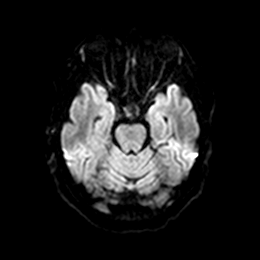
[im 24/36]
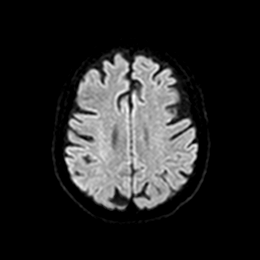
[im 36/36]
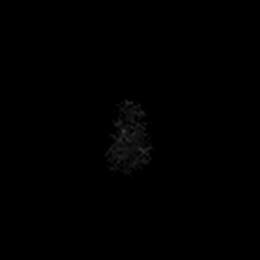

[Series 5: DWI · axial · 4.0mm · 0.88mm/px · z∈[-70,+69]mm · 4 of 36 slices shown (2 of 6)]
[im 1/36]
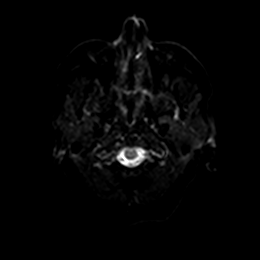
[im 12/36]
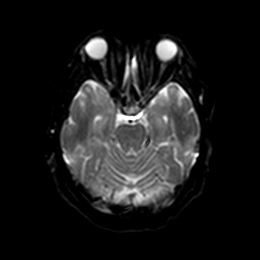
[im 24/36]
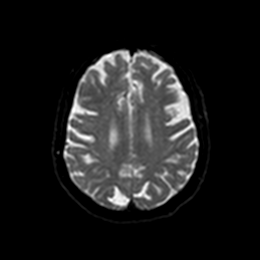
[im 36/36]
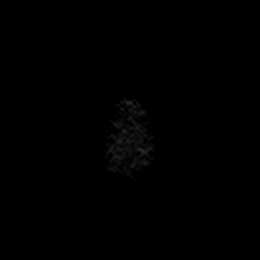

[Series 6: DWI · axial · 4.0mm · 0.88mm/px · z∈[-70,+69]mm · 4 of 36 slices shown (3 of 6)]
[im 1/36]
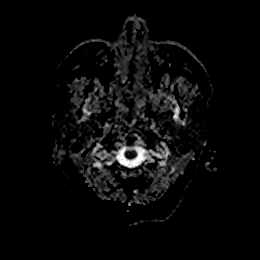
[im 12/36]
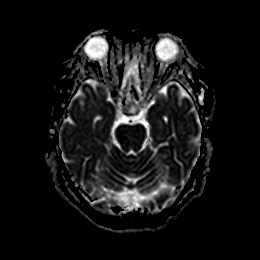
[im 24/36]
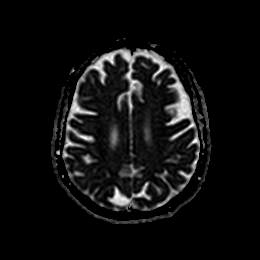
[im 36/36]
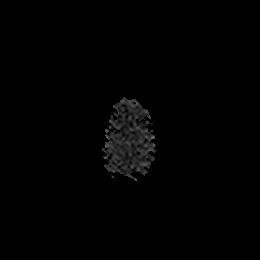

[Series 7: DWI · coronal · 5.0mm · 0.88mm/px · 4 of 28 slices shown (4 of 6)]
[im 1/28]
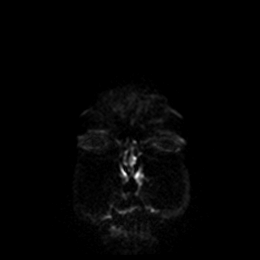
[im 10/28]
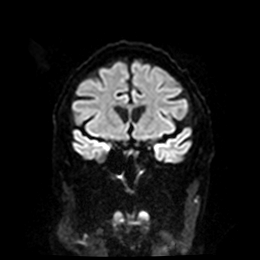
[im 19/28]
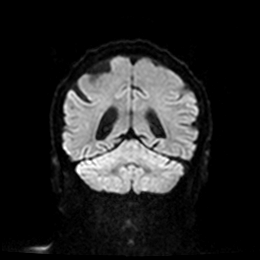
[im 28/28]
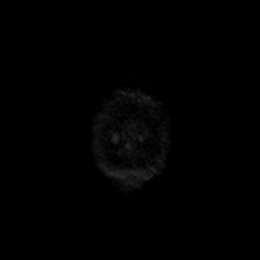

[Series 7: DWI · coronal · 5.0mm · 0.88mm/px · 4 of 28 slices shown (5 of 6)]
[im 1/28]
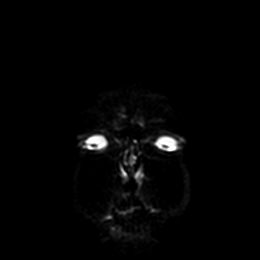
[im 10/28]
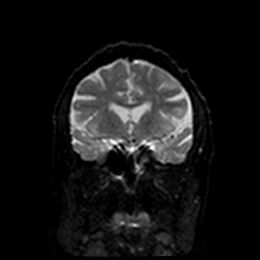
[im 19/28]
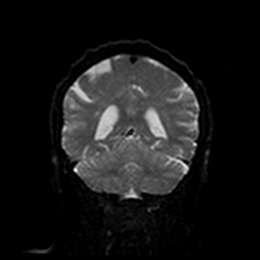
[im 28/28]
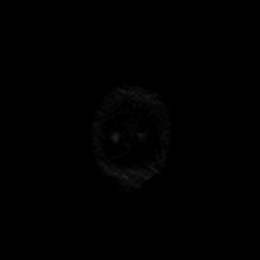

[Series 8: DWI · coronal · 5.0mm · 0.88mm/px · 4 of 28 slices shown (6 of 6)]
[im 1/28]
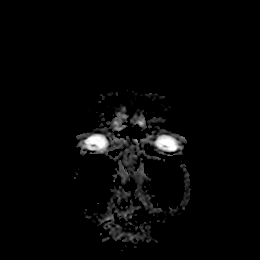
[im 10/28]
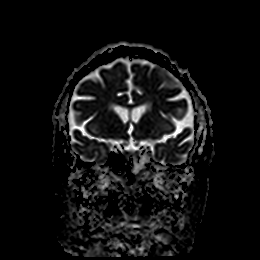
[im 19/28]
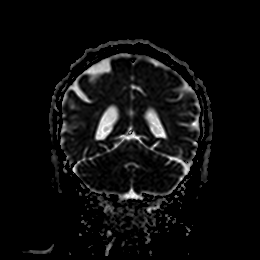
[im 28/28]
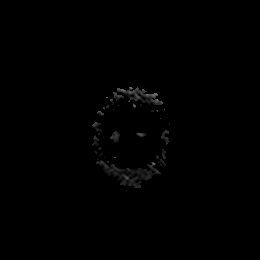

[Series 9: T1 · sagittal · 5.0mm · 0.94mm/px · 3 of 25 slices shown]
[im 1/25]
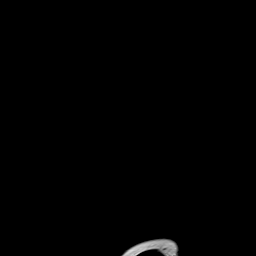
[im 13/25]
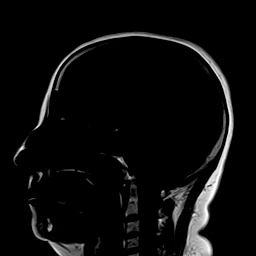
[im 25/25]
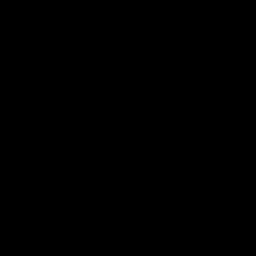

[Series 10: T2 · axial · 5.0mm · 0.72mm/px · z∈[-67,+66]mm · 3 of 20 slices shown (1 of 2)]
[im 1/20]
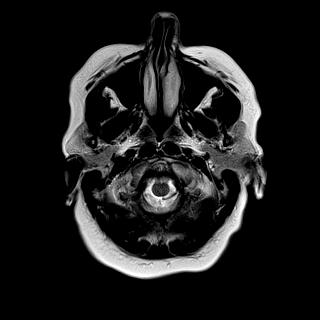
[im 10/20]
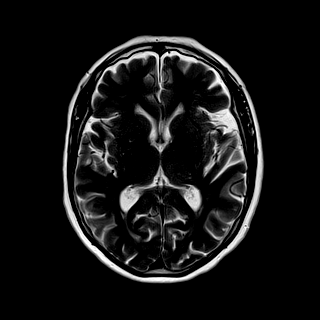
[im 20/20]
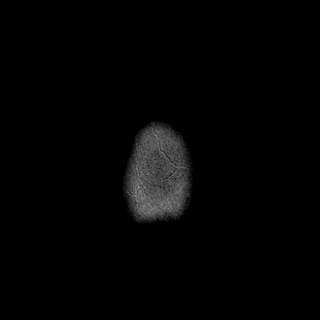

[Series 11: ax hemo · axial · 5.0mm · 0.86mm/px · z∈[-71,+72]mm · 3 of 25 slices shown]
[im 1/25]
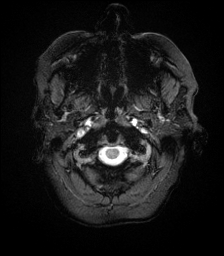
[im 13/25]
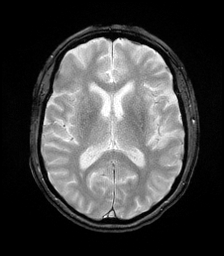
[im 25/25]
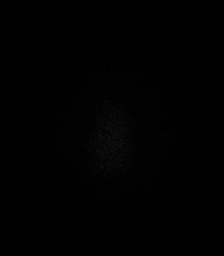

[Series 12: FLAIR · axial · 4.0mm · 0.43mm/px · z∈[-61,+62]mm · 4 of 32 slices shown]
[im 1/32]
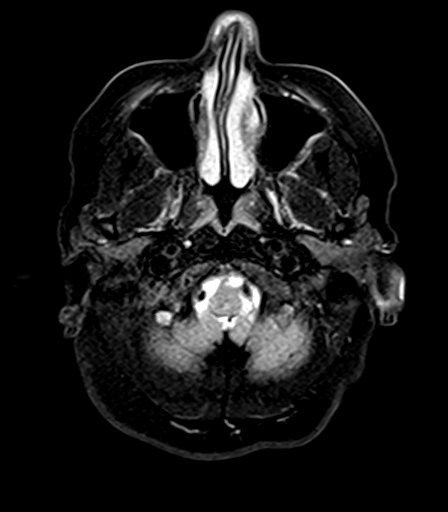
[im 11/32]
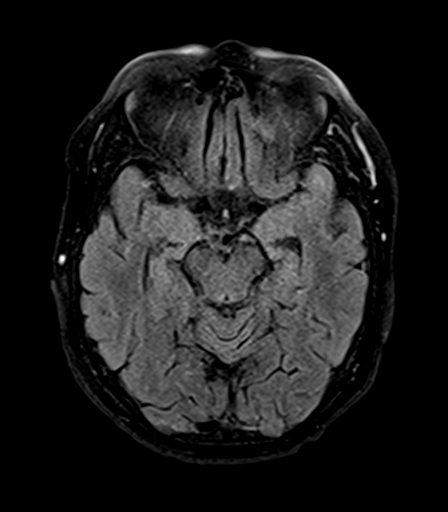
[im 21/32]
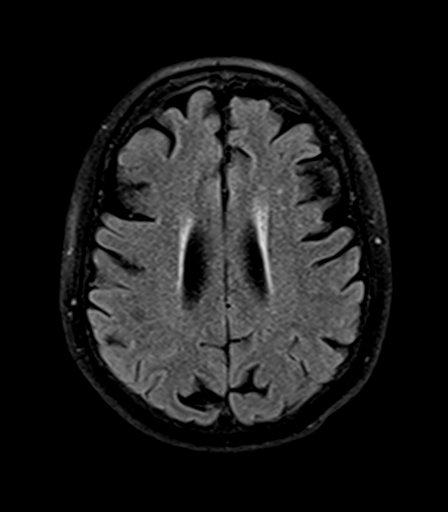
[im 32/32]
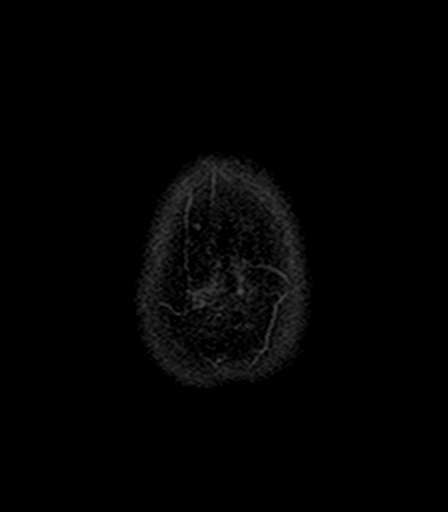

[Series 14: T2 · coronal · 5.0mm · 0.72mm/px · 4 of 28 slices shown (2 of 2)]
[im 1/28]
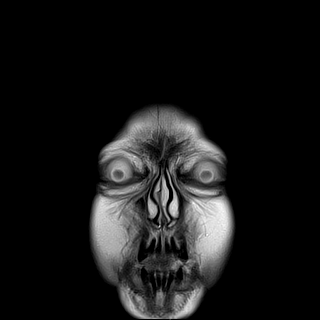
[im 10/28]
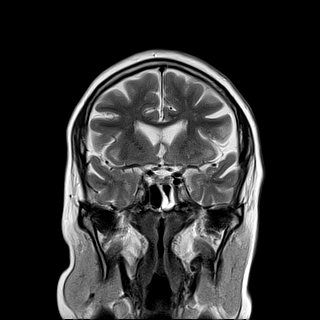
[im 19/28]
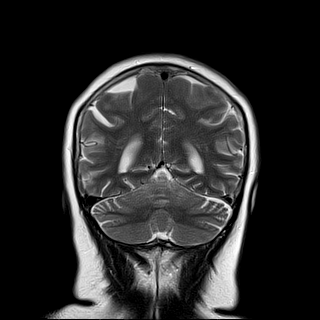
[im 28/28]
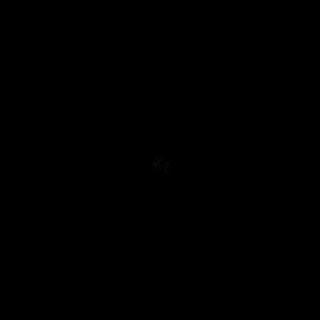

[41 of 48 positions shown; findings below may reference images not displayed]

FINDINGS: Brain:

Mild cerebral and cerebellar atrophy.

Mild multifocal T2/FLAIR hyperintensity within the cerebral white
matter is nonspecific, but compatible with chronic small vessel
ischemic disease.

There is no acute infarct.

No evidence of intracranial mass.

No chronic intracranial blood products.

No extra-axial fluid collection.

No midline shift.

Vascular: Expected proximal arterial flow voids.

Skull and upper cervical spine: No focal marrow lesion.

Sinuses/Orbits: Visualized orbits show no acute finding. Moderate
left sphenoid sinus mucosal thickening.
IMPRESSION: No evidence of acute intracranial abnormality, including acute
infarction.

Mild cerebral atrophy and chronic small vessel ischemic disease,
stable as compared to the brain MRI of [DATE].

Moderate left sphenoid sinus mucosal thickening.

## 2020-09-26 MED ORDER — TETRACAINE HCL 0.5 % OP SOLN
2.0000 [drp] | Freq: Once | OPHTHALMIC | Status: AC
  Administered 2020-09-26: 2 [drp] via OPHTHALMIC
  Filled 2020-09-26: qty 4

## 2020-09-26 NOTE — ED Triage Notes (Signed)
Pt reports that she went to bed around 0900 and watched TV prior to falling asleep and woke up at at 0530 seeing only shadows in right eye Denies pain

## 2020-09-26 NOTE — ED Notes (Signed)
Dr Long at bedside

## 2020-09-26 NOTE — Discharge Instructions (Addendum)
You were seen in the emergency department today with vision loss in your right eye.  The MRI and lab work performed today were normal.  I have arranged for you to see an ophthalmologist, Dr. Eulas Post, at 12:30 PM today.  Please have your daughter drive you to the office.  Dr. Eulas Post can perform a more detailed exam and advise from there.  If you develop any new or suddenly worsening symptoms please return to the emergency department immediately and/or call 911.

## 2020-09-26 NOTE — ED Provider Notes (Signed)
Emergency Department Provider Note   I have reviewed the triage vital signs and the nursing notes.   HISTORY  Chief Complaint Blurred Vision   HPI April Ross is a 69 y.o. female with past medical history reviewed below presents to the emergency department with painless, monocular vision loss starting sometime in the night.  Patient went to bed at 9 PM yesterday with normal vision.  She woke up at 530 this morning and was only able to see shadows in the entire right visual field.  She closes the right eye and feels like her vision from the left eye is normal.  She is not having pain in the eye or headaches.  She is not experiencing speech change, numbness, weakness in the arms or legs.  Denies difficulty swallowing.  No prior eye surgeries.  She does not wear contact lenses but does wear glasses.  No radiation of symptoms or other modifying factors.   Past Medical History:  Diagnosis Date  . Allergy   . Arthritis   . CRD (chronic renal disease)    stage 3  . Depression   . Diabetes mellitus without complication ([place])   . GERD (gastroesophageal reflux disease)   . Hyperlipidemia   . Hypertension   . LBBB (left bundle branch block)    a. diagnosed in 10/2017 - echo showed a preserved EF of 55-60% and NST was low-risk showing no evidence of ischemia.   . Osteoporosis   . polyps in colon   . Thyroid disease   . Urinary tract infection     Patient Active Problem List   Diagnosis Date Noted  . ETD (Eustachian tube dysfunction), right 08/04/2019  . Chronic bilateral thoracic back pain 11/21/2018  . Type 2 diabetes mellitus with nephropathy ([place]) 10/27/2017  . Tobacco abuse 10/27/2017  . Nonspecific chest pain   . Dizziness 05/16/2017  . Osteoporosis 05/24/2016  . CKD (chronic kidney disease) stage 3, GFR 30-59 ml/min (HCC) 05/08/2016  . Pure hypercholesterolemia 01/12/2016  . Hypothyroidism 01/12/2016  . Essential hypertension, benign 01/12/2016  . Allergic rhinitis  01/12/2016  . Moderate major depression, single episode ([place]) 01/12/2016  . Chronic insomnia 01/12/2016  . GERD (gastroesophageal reflux disease) 01/12/2016    Past Surgical History:  Procedure Laterality Date  . ABDOMINAL HYSTERECTOMY    . APPENDECTOMY      Allergies Acetaminophen, Asa [aspirin], Ibuprofen, and Other  Family History  Problem Relation Age of Onset  . Alzheimer's disease Mother   . Hyperlipidemia Mother   . Hypertension Mother   . Asthma Maternal Grandmother   . Diabetes Maternal Grandfather   . Alcohol abuse Maternal Grandfather   . Hyperlipidemia Maternal Grandfather   . Stroke Maternal Grandfather   . Hypertension Maternal Grandfather   . Diabetes Paternal Grandmother   . Alcohol abuse Maternal Uncle   . Cholecystitis Maternal Uncle   . Arthritis Maternal Uncle     Social History Social History   Tobacco Use  . Smoking status: Current Every Day Smoker    Packs/day: 0.25    Types: Cigarettes  . Smokeless tobacco: Never Used  Vaping Use  . Vaping Use: Never used  Substance Use Topics  . Alcohol use: Yes    Alcohol/week: 0.0 standard drinks    Comment: occ glass of wine or mix drink  . Drug use: No    Review of Systems  Constitutional: No fever/chills Eyes: Right eye vision loss ENT: No sore throat. Cardiovascular: Denies chest pain. Respiratory: Denies  shortness of breath. Gastrointestinal: No abdominal pain.  No nausea, no vomiting.  No diarrhea.  No constipation. Genitourinary: Negative for dysuria. Musculoskeletal: Negative for back pain. Skin: Negative for [place]. Neurological: Negative for headaches, focal weakness or numbness.  10-point ROS otherwise negative.  ____________________________________________   PHYSICAL EXAM:  VITAL SIGNS: ED Triage Vitals [09/26/20 0750]  Enc Vitals Group     BP (!) 199/98     Pulse Rate 92     Resp 18     Temp 98.1 F (36.7 C)     Temp Source Oral     SpO2 100 %     Weight 154 lb (69.9  kg)     Height 5\' 2"  (1.575 m)   Constitutional: Alert and oriented. Well appearing and in no acute distress. Eyes: Conjunctivae are normal. PERRL (71mm). EOMI. IOP: R: 21, L: 20.  Funduscopic (bedside) exam shows normal disc with no clear ischemic change to the retina.  Head: Atraumatic. Nose: No congestion/rhinnorhea. Mouth/Throat: Mucous membranes are moist.  Neck: No stridor.  Cardiovascular: Normal rate, regular rhythm. Good peripheral circulation. Grossly normal heart sounds.   Respiratory: Normal respiratory effort.  No retractions. Lungs CTAB. Gastrointestinal: Soft and nontender. No distention.  Musculoskeletal: No lower extremity tenderness nor edema. No gross deformities of extremities. Neurologic:  Normal speech and language. No gross focal neurologic deficits are appreciated other than vision loss as above. 5/5 strength in the upper/lower extremities. No numbness.  Skin:  Skin is warm, dry and intact. No [place]son noted.  ____________________________________________   LABS (all labs ordered are listed, but only abnormal results are displayed)  Labs Reviewed  COMPREHENSIVE METABOLIC PANEL - Abnormal; Notable for the following components:      Result Value   Glucose, Bld 251 (*)    BUN 24 (*)    Creatinine, Ser 1.36 (*)    AST 75 (*)    GFR, Estimated 42 (*)    All other components within normal limits  CBC WITH DIFFERENTIAL/PLATELET  PROTIME-INR   ____________________________________________  EKG   EKG Interpretation  Date/Time:  Monday September 26 2020 08:15:08 EST Ventricular Rate:  83 PR Interval:    QRS Duration: 131 QT Interval:  434 QTC Calculation: 510 R Axis:   51 Text Interpretation: Sinus rhythm Consider right atrial enlargement Nonspecific intraventricular conduction delay Probable anteroseptal infarct, recent No significant change since last tracing No STEMI Confirmed by Nanda Quinton [phone]) on 09/26/2020 8:21:09 AM        ____________________________________________  RADIOLOGY  MR BRAIN WO CONTRAST  Result Date: 09/26/2020 CLINICAL DATA:  Stroke, follow-up. Additional history provided: Weakness and dizziness for 1 day. EXAM: MRI HEAD WITHOUT CONTRAST TECHNIQUE: Multiplanar, multiecho pulse sequences of the brain and surrounding structures were obtained without intravenous contrast. COMPARISON:  Head CT 04/12/2020.  Brain MRI 11/30/2019. FINDINGS: Brain: Mild cerebral and cerebellar atrophy. Mild multifocal T2/FLAIR hyperintensity within the cerebral white matter is nonspecific, but compatible with chronic small vessel ischemic disease. There is no acute infarct. No evidence of intracranial mass. No chronic intracranial blood products. No extra-axial fluid collection. No midline shift. Vascular: Expected proximal arterial flow voids. Skull and upper cervical spine: No focal marrow lesion. Sinuses/Orbits: Visualized orbits show no acute finding. Moderate left sphenoid sinus mucosal thickening. IMPRESSION: No evidence of acute intracranial abnormality, including acute infarction. Mild cerebral atrophy and chronic small vessel ischemic disease, stable as compared to the brain MRI of 11/30/2019. Moderate left sphenoid sinus mucosal thickening. Electronically Signed   By: Marylyn Ishihara  Golden DO   On: 09/26/2020 10:25    ____________________________________________   PROCEDURES  Procedure(s) performed:   Procedures  Ultrasound: Limited Ocular  Performed and interpreted by Dr Laverta Baltimore Indication: Right eye vision loss Using high frequency linear probe, ultrasound of the globe was performed in real time in two planes with patient looking left and right if able Interpretation: No retinal detachment visualized.  Lens was in proper location.  No hemorrhage appreciated.    ____________________________________________   INITIAL IMPRESSION / ASSESSMENT AND PLAN / ED COURSE  Pertinent labs & imaging results that were  available during my care of the patient were reviewed by me and considered in my medical decision making (see chart for details).   Patient presents to the emergency department with painless, monocular vision loss.  Last seen normal 9 PM yesterday.  The patient is not having pain in the eye or headache.  No focal neurologic deficits. CRAO is a consideration along with retinal detachment with central vision disturbance from CVA much less likely.  No homominous component. Spoke with Dr. Eulas Post with ophthalmology who agree with plan for MRI brain and basic labs. If negative. He will evaluate in the office today.   10:30 AM  Patient's MRI results have come back and have reviewed them.  No acute findings to explain the patient's symptoms.  On reassessment her vision in the right eye is improving slightly.  She is able to make out fingers on exam which she was unable to do before. Spoke with Dr. Eulas Post by phone. He will see the patient in the office today at 12:30.  The patient's daughter is here at bedside and will be able to get her to this appointment.  ____________________________________________  FINAL CLINICAL IMPRESSION(S) / ED DIAGNOSES  Final diagnoses:  Vision loss of right eye     MEDICATIONS GIVEN DURING THIS VISIT:  Medications  tetracaine (PONTOCAINE) 0.5 % ophthalmic solution 2 drop (2 drops Both Eyes Given 09/26/20 0814)    Note:  This document was prepared using Dragon voice recognition software and may include unintentional dictation errors.  Nanda Quinton, MD, [facility]    Ahmira Boisselle, [facility], MD 09/26/20 [phone]

## 2020-09-26 NOTE — ED Notes (Signed)
Pt to MRI

## 2020-09-27 ENCOUNTER — Telehealth: Payer: Self-pay | Admitting: Family Medicine

## 2020-09-27 NOTE — Telephone Encounter (Signed)
EVONNE CI WANTED TO KNOW IF DR.BEDSOLE CAN DOA WORK UP FOR April Ross DUE TO SHE AMAUROSIS FUGAX (LOST OF SUDDEN VISION) THEY NEED IT IN THE NEXT WEEK OR SO, CAROTID ULTRASOUND AND ECHOCARDIOGRAM

## 2020-09-27 NOTE — Telephone Encounter (Signed)
Please make appt with some one in office if she needs this prior to me returning from vacation.

## 2020-09-27 NOTE — Telephone Encounter (Signed)
Spoke with April Ross.  They would like to have April Ross seen sometime in the next week if at all possible.  Appointment scheduled with Dr. Lorelei Pont 10/03/2020 at 8:20 am.  FYI to Dr. Lorelei Pont.

## 2020-09-27 NOTE — Telephone Encounter (Signed)
FAXED OVER DOCTORS NOTES YESTERDAY AFTER APPOINTMENT, AND EVONNE WANTED A CALL BACK WITH APPOINTMENTS IF SET UP, [place] [phone]

## 2020-09-28 ENCOUNTER — Other Ambulatory Visit: Payer: Self-pay

## 2020-09-28 ENCOUNTER — Emergency Department (HOSPITAL_COMMUNITY)
Admission: EM | Admit: 2020-09-28 | Discharge: 2020-09-28 | Disposition: A | Discharge status: Home / Self Care | Payer: PPO | Attending: Emergency Medicine | Admitting: Emergency Medicine

## 2020-09-28 ENCOUNTER — Encounter (HOSPITAL_COMMUNITY): Payer: Self-pay

## 2020-09-28 DIAGNOSIS — E1122 Type 2 diabetes mellitus with diabetic chronic kidney disease: Secondary | ICD-10-CM | POA: Insufficient documentation

## 2020-09-28 DIAGNOSIS — N183 Chronic kidney disease, stage 3 unspecified: Secondary | ICD-10-CM | POA: Insufficient documentation

## 2020-09-28 DIAGNOSIS — H538 Other visual disturbances: Secondary | ICD-10-CM | POA: Insufficient documentation

## 2020-09-28 DIAGNOSIS — I10 Essential (primary) hypertension: Secondary | ICD-10-CM | POA: Diagnosis not present

## 2020-09-28 DIAGNOSIS — Z79899 Other long term (current) drug therapy: Secondary | ICD-10-CM | POA: Insufficient documentation

## 2020-09-28 DIAGNOSIS — I129 Hypertensive chronic kidney disease with stage 1 through stage 4 chronic kidney disease, or unspecified chronic kidney disease: Secondary | ICD-10-CM | POA: Diagnosis not present

## 2020-09-28 DIAGNOSIS — Z7982 Long term (current) use of aspirin: Secondary | ICD-10-CM | POA: Diagnosis not present

## 2020-09-28 DIAGNOSIS — E039 Hypothyroidism, unspecified: Secondary | ICD-10-CM | POA: Diagnosis not present

## 2020-09-28 LAB — COMPREHENSIVE METABOLIC PANEL
ALT: 37 U/L (ref 0–44)
AST: 55 U/L — ABNORMAL HIGH (ref 15–41)
Albumin: 4.1 g/dL (ref 3.5–5.0)
Alkaline Phosphatase: 66 U/L (ref 38–126)
Anion gap: 11 (ref 5–15)
BUN: 25 mg/dL — ABNORMAL HIGH (ref 8–23)
CO2: 24 mmol/L (ref 22–32)
Calcium: 9.7 mg/dL (ref 8.9–10.3)
Chloride: 102 mmol/L (ref 98–111)
Creatinine, Ser: 1.42 mg/dL — ABNORMAL HIGH (ref 0.44–1.00)
GFR, Estimated: 40 mL/min — ABNORMAL LOW (ref >60)
Glucose, Bld: 207 mg/dL — ABNORMAL HIGH (ref 70–99)
Potassium: 4 mmol/L (ref 3.5–5.1)
Sodium: 137 mmol/L (ref 135–145)
Total Bilirubin: 0.4 mg/dL (ref 0.3–1.2)
Total Protein: 6.9 g/dL (ref 6.5–8.1)

## 2020-09-28 LAB — CBC WITH DIFFERENTIAL/PLATELET
Abs Immature Granulocytes: 0.01 10*3/uL (ref 0.00–0.07)
Basophils Absolute: 0.1 10*3/uL (ref 0.0–0.1)
Basophils Relative: 1 %
Eosinophils Absolute: 0.6 10*3/uL — ABNORMAL HIGH (ref 0.0–0.5)
Eosinophils Relative: 8 %
HCT: 36.4 % (ref 36.0–46.0)
Hemoglobin: 11.6 g/dL — ABNORMAL LOW (ref 12.0–15.0)
Immature Granulocytes: 0 %
Lymphocytes Relative: 28 %
Lymphs Abs: 2.3 10*3/uL (ref 0.7–4.0)
MCH: 27 pg (ref 26.0–34.0)
MCHC: 31.9 g/dL (ref 30.0–36.0)
MCV: 84.7 fL (ref 80.0–100.0)
Monocytes Absolute: 1 10*3/uL (ref 0.1–1.0)
Monocytes Relative: 12 %
Neutro Abs: 4.3 10*3/uL (ref 1.7–7.7)
Neutrophils Relative %: 51 %
Platelets: 223 10*3/uL (ref 150–400)
RBC: 4.3 MIL/uL (ref 3.87–5.11)
RDW: 14.2 % (ref 11.5–15.5)
WBC: 8.3 10*3/uL (ref 4.0–10.5)
nRBC: 0 % (ref 0.0–0.2)

## 2020-09-28 LAB — URINALYSIS, ROUTINE W REFLEX MICROSCOPIC
Bacteria, UA: NONE SEEN
Bilirubin Urine: NEGATIVE
Glucose, UA: NEGATIVE mg/dL
Hgb urine dipstick: NEGATIVE
Ketones, ur: NEGATIVE mg/dL
Leukocytes,Ua: NEGATIVE
Nitrite: NEGATIVE
Protein, ur: 100 mg/dL — AB
Specific Gravity, Urine: 1.016 (ref 1.005–1.030)
pH: 5 (ref 5.0–8.0)

## 2020-09-28 MED ORDER — HYDROCHLOROTHIAZIDE 25 MG PO TABS: 12.5000 mg | ORAL_TABLET | Freq: Every day | ORAL | 0 refills | Status: DC

## 2020-09-28 MED ORDER — POTASSIUM CHLORIDE ER 10 MEQ PO TBCR: 10.0000 meq | EXTENDED_RELEASE_TABLET | Freq: Every day | ORAL | 0 refills | Status: DC

## 2020-09-28 MED ORDER — HYDRALAZINE HCL 10 MG PO TABS
10.0000 mg | ORAL_TABLET | Freq: Once | ORAL | Status: AC
  Administered 2020-09-28: 22:00:00 10 mg via ORAL
  Filled 2020-09-28 (×2): qty 1

## 2020-09-28 NOTE — Discharge Instructions (Signed)
We are adding a new blood pressure medicine.  Make sure you stay on a DASH diet.  See your doctor for checkup next week.

## 2020-09-28 NOTE — ED Notes (Signed)
ED Provider at bedside. 

## 2020-09-28 NOTE — ED Triage Notes (Signed)
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  States that she is here today because she is hypertensive.  States that she has been having a bp 190/100 all week

## 2020-09-28 NOTE — ED Notes (Signed)
Pt up to bathroom, returned to bed. Urine sample obtained and walked to lab. VS updated. Awaiting medication, not available in pyxis

## 2020-09-28 NOTE — ED Provider Notes (Signed)
  Face-to-face evaluation   History: Patient is here today with concern for elevated blood pressure. She was in the ED, 2 days ago at that time was diagnosed with an ocular stroke, and followed up with ophthalmology the day. Her vision has been gradually improving and there are no interventions planned.  Physical exam: Alert, calm, cooperative.  No dysarthria or aphasia.  No respiratory distress.  Reevaluation time 10:35 PM.  She is comfortable at this time.  Most recent blood pressure 170/93.  Findings discussed with the patient and all questions were answered.  Plan-prescription for HCTZ and potassium sent to her pharmacy to add on to her current treatment program.  She has a follow-up appointment with her PCP for next week.  Medical screening examination/treatment/procedure(s) were conducted as a shared visit with non-physician practitioner(s) and myself.  I personally evaluated the patient during the encounter    Daleen Bo, MD 09/28/20 2236

## 2020-09-28 NOTE — ED Provider Notes (Signed)
[facility] EMERGENCY DEPARTMENT Provider Note   CSN: [id number] Arrival date & time: 09/28/20  1726     History Chief Complaint  Patient presents with  . Hypertension    April Ross is a 69 y.o. female with pertinent past medical history of chronic renal disease, diabetes, hyperlipidemia, hypertension, hypothyroid disease that presents the emergency department today for evaluation of her hypertension.  Patient states that she was recently seen here on Monday for blurry vision, saw by Dr. Eulas Post ophthalmology.  Unable to see Dr. Roda Shutters note, however patient states that he thinks that she could have had a small ocular stroke on Monday.  Patient states that at that time she had blurry vision on her right eye, states that her symptoms of blurry vision are getting much better, states that her vision is almost back to normal.  States that she is here today because she checked her blood pressure and her systolic pressure was [id number], checked it multiple times throughout the day and it has remained this high.  States that she recently got a blood pressure cuff on Monday after noticing that her blood pressure was high here.  Patient states that she has not checked her blood pressure before Monday, upon chart review I was able to see patient's systolic blood pressure at 188 at a recent PCP visit in August of this year.  Patient states that she is on losartan for blood pressure control, 100 mg daily.  Is not taking anything else for blood pressure control.  Patient states that her family and her were concerned about her blood work and wanted to be evaluated in the emergency department for her hypertension.  Patient states that she is currently asymptomatic.  Denies any chest pain, shortness of breath, numbness, tingling, dysuria, hematuria, back pain, headache, new vision changes.  Denies any neck pain, fevers, chills, nausea, vomiting, photophobia.  HPI     Past Medical History:  Diagnosis Date  .  Allergy   . Arthritis   . CRD (chronic renal disease)    stage 3  . Depression   . Diabetes mellitus without complication ([place])   . GERD (gastroesophageal reflux disease)   . Hyperlipidemia   . Hypertension   . LBBB (left bundle branch block)    a. diagnosed in 10/2017 - echo showed a preserved EF of 55-60% and NST was low-risk showing no evidence of ischemia.   . Osteoporosis   . polyps in colon   . Thyroid disease   . Urinary tract infection     Patient Active Problem List   Diagnosis Date Noted  . ETD (Eustachian tube dysfunction), right 08/04/2019  . Chronic bilateral thoracic back pain 11/21/2018  . Type 2 diabetes mellitus with nephropathy ([place]) 10/27/2017  . Tobacco abuse 10/27/2017  . Nonspecific chest pain   . Dizziness 05/16/2017  . Osteoporosis 05/24/2016  . CKD (chronic kidney disease) stage 3, GFR 30-59 ml/min (HCC) 05/08/2016  . Pure hypercholesterolemia 01/12/2016  . Hypothyroidism 01/12/2016  . Essential hypertension, benign 01/12/2016  . Allergic rhinitis 01/12/2016  . Moderate major depression, single episode ([place]) 01/12/2016  . Chronic insomnia 01/12/2016  . GERD (gastroesophageal reflux disease) 01/12/2016    Past Surgical History:  Procedure Laterality Date  . ABDOMINAL HYSTERECTOMY    . APPENDECTOMY       OB History   No obstetric history on file.     Family History  Problem Relation Age of Onset  . Alzheimer's disease Mother   . Hyperlipidemia  Mother   . Hypertension Mother   . Asthma Maternal Grandmother   . Diabetes Maternal Grandfather   . Alcohol abuse Maternal Grandfather   . Hyperlipidemia Maternal Grandfather   . Stroke Maternal Grandfather   . Hypertension Maternal Grandfather   . Diabetes Paternal Grandmother   . Alcohol abuse Maternal Uncle   . Cholecystitis Maternal Uncle   . Arthritis Maternal Uncle     Social History   Tobacco Use  . Smoking status: Current Every Day Smoker    Packs/day: 0.25    Types: Cigarettes   . Smokeless tobacco: Never Used  Vaping Use  . Vaping Use: Never used  Substance Use Topics  . Alcohol use: Yes    Alcohol/week: 0.0 standard drinks    Comment: occ glass of wine or mix drink  . Drug use: No    Home Medications Prior to Admission medications   Medication Sig Start Date End Date Taking? Authorizing Provider  alendronate (FOSAMAX) 70 MG tablet TAKE 1 TABLET BY MOUTH EVERY 7 DAYS WITH A FULL GLASS OF WATER ON AN EMPTY STOMACH Patient taking differently: Take 70 mg by mouth once a week. 08/23/20  Yes Bedsole, Amy E, MD  aspirin EC 81 MG tablet Take 81 mg by mouth daily. Swallow whole.   Yes [provider]  Calcium Carb-Cholecalciferol (CALCIUM-VITAMIN D) 500-200 MG-UNIT tablet Take 1 tablet by mouth daily.   Yes [provider]  cetirizine (ZYRTEC) 10 MG tablet Take 10 mg by mouth at bedtime as needed for allergies.   Yes [provider]  CINNAMON PO Take 1 tablet by mouth daily.   Yes [provider]  fluticasone (FLONASE) 50 MCG/ACT nasal spray SPRAY 2 SPRAYS INTO EACH NOSTRIL EVERY DAY Patient taking differently: Place 2 sprays into both nostrils daily as needed for allergies. 11/02/19  Yes Bedsole, Amy E, MD  levothyroxine (SYNTHROID) 88 MCG tablet TAKE 1 TABLET BY MOUTH EVERY DAY Patient taking differently: Take 88 mcg by mouth daily. 09/05/20  Yes Bedsole, Amy E, MD  losartan (COZAAR) 100 MG tablet TAKE 1 TABLET BY MOUTH EVERY DAY Patient taking differently: Take 100 mg by mouth daily. 06/30/20  Yes Bedsole, Amy E, MD  metFORMIN (GLUCOPHAGE) 500 MG tablet TAKE 1 TABLET BY MOUTH 2 TIMES DAILY WITH A MEAL. Patient taking differently: Take 500 mg by mouth 2 (two) times daily with a meal. 08/22/20  Yes Bedsole, Amy E, MD  Omega-3 Fatty Acids (FISH OIL OMEGA-3 PO) Take 1 capsule by mouth daily.   Yes [provider]  omeprazole (PRILOSEC) 20 MG capsule TAKE 1 CAPSULE BY MOUTH EVERY DAY Patient taking differently: Take 20 mg by  mouth daily. 09/05/20  Yes Bedsole, Amy E, MD  rosuvastatin (CRESTOR) 20 MG tablet TAKE 1 TABLET BY MOUTH EVERY DAY Patient taking differently: Take 20 mg by mouth daily. 07/15/20  Yes Bedsole, Amy E, MD  tiZANidine (ZANAFLEX) 4 MG tablet TAKE 1 TABLET BY MOUTH AT BEDTIME AS NEEDED FOR MUSCLE SPASMS. Patient taking differently: Take 4 mg by mouth at bedtime as needed for muscle spasms. 08/18/19  Yes Bedsole, Amy E, MD  Turmeric (QC TUMERIC COMPLEX PO) Take 1 tablet by mouth daily.   Yes [provider]  buPROPion (WELLBUTRIN XL) 150 MG 24 hr tablet TAKE 1 TABLET BY MOUTH EVERY DAY Patient not taking: No sig reported 06/23/20   Bedsole, Amy E, MD  FLUoxetine (PROZAC) 40 MG capsule TAKE 1 CAPSULE BY MOUTH EVERY DAY Patient not taking: No  sig reported 06/20/20   Jinny Sanders, MD    Allergies    Acetaminophen, Asa [aspirin], Ibuprofen, and Other  Review of Systems   Review of Systems  Constitutional: Negative for chills, diaphoresis, fatigue and fever.  HENT: Negative for congestion, sore throat and trouble swallowing.   Eyes: Negative for pain and visual disturbance.  Respiratory: Negative for cough, shortness of breath and wheezing.   Cardiovascular: Negative for chest pain, palpitations and leg swelling.  Gastrointestinal: Negative for abdominal distention, abdominal pain, diarrhea, nausea and vomiting.  Genitourinary: Negative for difficulty urinating.  Musculoskeletal: Negative for back pain, neck pain and neck stiffness.  Skin: Negative for pallor.  Neurological: Negative for dizziness, speech difficulty, weakness and headaches.  Psychiatric/Behavioral: Negative for confusion.    Physical Exam Updated Vital Signs BP (!) 199/104 (BP Location: Right Arm)   Pulse 99   Temp 98.9 F (37.2 C) (Oral)   Resp 18   Ht 5\' 2"  (1.575 m)   Wt 69.9 kg   SpO2 98%   BMI 28.17 kg/m   Physical Exam Constitutional:      General: She is not in acute distress.    Appearance: Normal  appearance. She is not ill-appearing, toxic-appearing or diaphoretic.  HENT:     Mouth/Throat:     Mouth: Mucous membranes are moist.     Pharynx: Oropharynx is clear.  Eyes:     General: Vision grossly intact. No scleral icterus.    Extraocular Movements: Extraocular movements intact.     Pupils: Pupils are equal, round, and reactive to light.  Cardiovascular:     Rate and Rhythm: Normal rate and regular rhythm.     Pulses: Normal pulses.     Heart sounds: Normal heart sounds.  Pulmonary:     Effort: Pulmonary effort is normal. No respiratory distress.     Breath sounds: Normal breath sounds. No stridor. No wheezing, rhonchi or rales.  Chest:     Chest wall: No tenderness.  Abdominal:     General: Abdomen is flat. There is no distension.     Palpations: Abdomen is soft.     Tenderness: There is no abdominal tenderness. There is no guarding or rebound.  Musculoskeletal:        General: No swelling or tenderness. Normal range of motion.     Cervical back: Normal range of motion and neck supple. No rigidity.     Right lower leg: No edema.     Left lower leg: No edema.  Skin:    General: Skin is warm and dry.     Capillary Refill: Capillary refill takes less than 2 seconds.     Coloration: Skin is not pale.  Neurological:     General: No focal deficit present.     Mental Status: She is alert and oriented to person, place, and time.     Comments: Alert. Clear speech. No facial droop. CNIII-XII grossly intact. Bilateral upper and lower extremities' sensation grossly intact. 5/5 symmetric strength with grip strength and with plantar and dorsi flexion bilaterally. Normal finger to nose bilaterally. Negative pronator drift. Negative Romberg sign.    Psychiatric:        Mood and Affect: Mood normal.        Behavior: Behavior normal.     ED Results / Procedures / Treatments   Labs (all labs ordered are listed, but only abnormal results are displayed) Labs Reviewed  COMPREHENSIVE  METABOLIC PANEL  CBC WITH DIFFERENTIAL/PLATELET  URINALYSIS, ROUTINE W  REFLEX MICROSCOPIC    EKG None  Radiology No results found.  Procedures Procedures (including critical care time)  Medications Ordered in ED Medications  hydrALAZINE (APRESOLINE) tablet 10 mg (has no administration in time range)    ED Course  I have reviewed the triage vital signs and the nursing notes.  Pertinent labs & imaging results that were available during my care of the patient were reviewed by me and considered in my medical decision making (see chart for details).    MDM Rules/Calculators/A&P                          Greidy Sherard Cregg is a 69 y.o. female with pertinent past medical history of chronic renal disease, diabetes, hyperlipidemia, hypertension, hypothyroid disease that presents the emergency department today for evaluation of her hypertension.  Patient is currently asymptomatic, doubt hypertensive emergency at this time.  Blood pressure currently 199/104, normal neuro exam. No more blurry vision.  Blood work ordered, small dose of hydralazine given.  CBC and CMP unremarkable.  Creatinine appears baseline.  EKG without any significant changes.  Pt care was handed off to Dr. Eulis Foster Complete history and physical and current plan have been communicated.  Please refer to their note for the remainder of ED care and ultimate disposition.  Awaiting urine and for patient to receive hydralazine.  If this is normal, patient is still asymptomatic patient can be discharged with an increase or an additional blood pressure management control.  I discussed this case with my attending physician who cosigned this note including patient's presenting symptoms, physical exam, and planned diagnostics and interventions. Attending physician stated agreement with plan or made changes to plan which were implemented.   Attending physician assessed patient at bedside.  Final Clinical Impression(s) / ED  Diagnoses Final diagnoses:  Primary hypertension    Rx / DC Orders ED Discharge Orders    None       Alfredia Client, PA-C 09/28/20 2120    Daleen Bo, MD 10/07/20 [phone]

## 2020-10-03 ENCOUNTER — Encounter: Payer: Self-pay | Admitting: Family Medicine

## 2020-10-03 ENCOUNTER — Ambulatory Visit (INDEPENDENT_AMBULATORY_CARE_PROVIDER_SITE_OTHER): Payer: PPO | Admitting: Family Medicine

## 2020-10-03 ENCOUNTER — Other Ambulatory Visit: Payer: Self-pay

## 2020-10-03 VITALS — BP 140/74 | HR 74 | Temp 97.3°F | Ht 62.25 in | Wt 153.5 lb

## 2020-10-03 DIAGNOSIS — E1121 Type 2 diabetes mellitus with diabetic nephropathy: Secondary | ICD-10-CM | POA: Diagnosis not present

## 2020-10-03 DIAGNOSIS — G453 Amaurosis fugax: Secondary | ICD-10-CM

## 2020-10-03 DIAGNOSIS — I10 Essential (primary) hypertension: Secondary | ICD-10-CM | POA: Diagnosis not present

## 2020-10-03 DIAGNOSIS — Z23 Encounter for immunization: Secondary | ICD-10-CM

## 2020-10-03 DIAGNOSIS — H5461 Unqualified visual loss, right eye, normal vision left eye: Secondary | ICD-10-CM

## 2020-10-03 NOTE — Progress Notes (Signed)
April Ross T. April Kilpatrick, MD, CAQ Sports Medicine  Primary Care and Sports Medicine [facility] [address] [place], [address]  Phone: [phone]  FAX: [phone]  April Ross - 69 y.o. female  MRN [id number]  Date of Birth: 1951/03/08  Date: 10/03/2020  PCP: Excell Seltzer, MD  Referral: Excell Seltzer, MD  CC: f/u optho / amaurosis fugau  This visit occurred during the SARS-CoV-2 public health emergency.  Safety protocols were in place, including screening questions prior to the visit, additional usage of staff PPE, and extensive cleaning of exam room while observing appropriate contact time as indicated for disinfecting solutions.   Subjective:   April Ross is a 69 y.o. very pleasant female patient with Body mass index is 27.85 kg/m. who presents with the following:  She is a very pleasant 70 year old patient.  She saw my partner less than 1 week ago, and she is here today for me to evaluate her secondary to my partner being out of town.  Has been to ER and Dr. Randon Goldsmith office.   I was asked to see the patient today in follow-up from her management visit from Dr. Hazle Quant' office.  Ophthalmology notes are reviewed as well as ER notes.  All imaging and labs are reviewed.  I did review her MRI of the brain with the patient, and there is no evidence of an acute abnormality.  We did review what small vessel white matter changes meant.  At this point she does have some significant high blood pressure, and she was started on hydrochlorothiazide 12.5 mg a day along with her maximum dose of losartan.  BP Readings from Last 3 Encounters:  10/03/20 140/74  09/28/20 (!) 169/72  09/26/20 (!) 184/80     - Just started some HCTZ 12.5 mg a day  Get BP cuff.  142/80 today  Review of Systems is noted in the HPI, as appropriate  Objective:   BP 140/74   Pulse 74   Temp (!) 97.3 F (36.3 C) (Temporal)   Ht 5' 2.25" (1.581 m)    Wt 153 lb 8 oz (69.6 kg)   SpO2 98%   BMI 27.85 kg/m   GEN: No acute distress; alert,appropriate. PULM: Breathing comfortably in no respiratory distress PSYCH: Normally interactive.  CV: RRR, no m/g/r  PULM: Normal respiratory rate, no accessory muscle use. No wheezes, crackles or rhonchi   Laboratory and Imaging Data: Results for orders placed or performed during the hospital encounter of 09/28/20  Comprehensive metabolic panel  Result Value Ref Range   Sodium 137 135 - 145 mmol/L   Potassium 4.0 3.5 - 5.1 mmol/L   Chloride 102 98 - 111 mmol/L   CO2 24 22 - 32 mmol/L   Glucose, Bld 207 (H) 70 - 99 mg/dL   BUN 25 (H) 8 - 23 mg/dL   Creatinine, Ser 7.25 (H) 0.44 - 1.00 mg/dL   Calcium 9.7 8.9 - 36.6 mg/dL   Total Protein 6.9 6.5 - 8.1 g/dL   Albumin 4.1 3.5 - 5.0 g/dL   AST 55 (H) 15 - 41 U/L   ALT 37 0 - 44 U/L   Alkaline Phosphatase 66 38 - 126 U/L   Total Bilirubin 0.4 0.3 - 1.2 mg/dL   GFR, Estimated 40 (L) >60 mL/min   Anion gap 11 5 - 15  CBC with Differential  Result Value Ref Range   WBC 8.3 4.0 - 10.5 K/uL  RBC 4.30 3.87 - 5.11 MIL/uL   Hemoglobin 11.6 (L) 12.0 - 15.0 g/dL   HCT 36.4 36.0 - 46.0 %   MCV 84.7 80.0 - 100.0 fL   MCH 27.0 26.0 - 34.0 pg   MCHC 31.9 30.0 - 36.0 g/dL   RDW 14.2 11.5 - 15.5 %   Platelets 223 150 - 400 K/uL   nRBC 0.0 0.0 - 0.2 %   Neutrophils Relative % 51 %   Neutro Abs 4.3 1.7 - 7.7 K/uL   Lymphocytes Relative 28 %   Lymphs Abs 2.3 0.7 - 4.0 K/uL   Monocytes Relative 12 %   Monocytes Absolute 1.0 0.1 - 1.0 K/uL   Eosinophils Relative 8 %   Eosinophils Absolute 0.6 (H) 0.0 - 0.5 K/uL   Basophils Relative 1 %   Basophils Absolute 0.1 0.0 - 0.1 K/uL   Immature Granulocytes 0 %   Abs Immature Granulocytes 0.01 0.00 - 0.07 K/uL  Urinalysis, Routine w reflex microscopic Urine, Clean Catch  Result Value Ref Range   Color, Urine YELLOW YELLOW   APPearance CLEAR CLEAR   Specific Gravity, Urine 1.016 1.005 - 1.030   pH 5.0 5.0 -  8.0   Glucose, UA NEGATIVE NEGATIVE mg/dL   Hgb urine dipstick NEGATIVE NEGATIVE   Bilirubin Urine NEGATIVE NEGATIVE   Ketones, ur NEGATIVE NEGATIVE mg/dL   Protein, ur 100 (A) NEGATIVE mg/dL   Nitrite NEGATIVE NEGATIVE   Leukocytes,Ua NEGATIVE NEGATIVE   RBC / HPF 0-5 0 - 5 RBC/hpf   WBC, UA 0-5 0 - 5 WBC/hpf   Bacteria, UA NONE SEEN NONE SEEN   Squamous Epithelial / LPF 0-5 0 - 5    MR BRAIN WO CONTRAST  Result Date: 09/26/2020 CLINICAL DATA:  Stroke, follow-up. Additional history provided: Weakness and dizziness for 1 day. EXAM: MRI HEAD WITHOUT CONTRAST TECHNIQUE: Multiplanar, multiecho pulse sequences of the brain and surrounding structures were obtained without intravenous contrast. COMPARISON:  Head CT 04/12/2020.  Brain MRI 11/30/2019. FINDINGS: Brain: Mild cerebral and cerebellar atrophy. Mild multifocal T2/FLAIR hyperintensity within the cerebral white matter is nonspecific, but compatible with chronic small vessel ischemic disease. There is no acute infarct. No evidence of intracranial mass. No chronic intracranial blood products. No extra-axial fluid collection. No midline shift. Vascular: Expected proximal arterial flow voids. Skull and upper cervical spine: No focal marrow lesion. Sinuses/Orbits: Visualized orbits show no acute finding. Moderate left sphenoid sinus mucosal thickening. IMPRESSION: No evidence of acute intracranial abnormality, including acute infarction. Mild cerebral atrophy and chronic small vessel ischemic disease, stable as compared to the brain MRI of 11/30/2019. Moderate left sphenoid sinus mucosal thickening. Electronically Signed   By: Kellie Simmering DO   On: 09/26/2020 10:25   Lab Results  Component Value Date   HGBA1C 6.6 (A) 06/07/2020     Assessment and Plan:     ICD-10-CM   1. Amaurosis fugax  G45.3 ECHOCARDIOGRAM COMPLETE    US Carotid Duplex Bilateral  2. Vision loss, right eye  H54.61 ECHOCARDIOGRAM COMPLETE    US Carotid Duplex Bilateral  3.  Need for influenza vaccination  Z23 Flu Vaccine QUAD High Dose(Fluad)  4. Essential hypertension, benign  I10   5. Type 2 diabetes mellitus with nephropathy (HCC)  E11.21    Total encounter time: 30 minutes. This includes total time spent on the day of encounter.  Time spent in chart review, lab review, consultation review, imaging review.  Discussed white matter disease with the patient.  In the  setting of amaurosis fugax at the request of ophthalmology, obtain an echocardiogram as well as an carotid ultrasound to evaluate for potential causes of this disease.  In the setting of white matter disease, diabetes, hypertension, control of blood pressure, diabetes, as well as lipids are needed.  The patient's blood pressure is elevated despite starting hydrochlorothiazide 12.5 mg.  I asked her to check her blood pressures daily and obtain a arm cuff.  She is to write these down and follow-up with Dr. Diona Browner in 1 month.  No orders of the defined types were placed in this encounter.  Medications Discontinued During This Encounter  Medication Reason  . buPROPion (WELLBUTRIN XL) 150 MG 24 hr tablet Patient Preference  . FLUoxetine (PROZAC) 40 MG capsule Patient Preference  . fluticasone (FLONASE) 50 MCG/ACT nasal spray Duplicate   Orders Placed This Encounter  Procedures  . US Carotid Duplex Bilateral  . Flu Vaccine QUAD High Dose(Fluad)  . ECHOCARDIOGRAM COMPLETE    Follow-up: Return in about 1 month (around 11/03/2020) for f/u Dr. Diona Browner.  Signed,  Maud Deed. Byrd Rushlow, MD   Outpatient Encounter Medications as of 10/03/2020  Medication Sig  . alendronate (FOSAMAX) 70 MG tablet TAKE 1 TABLET BY MOUTH EVERY 7 DAYS WITH A FULL GLASS OF WATER ON AN EMPTY STOMACH  . aspirin EC 81 MG tablet Take 81 mg by mouth daily. Swallow whole.  . Calcium Carb-Cholecalciferol (CALCIUM-VITAMIN D) 500-200 MG-UNIT tablet Take 1 tablet by mouth daily.  . cetirizine (ZYRTEC) 10 MG tablet Take 10 mg by mouth  at bedtime as needed for allergies.  Marland Kitchen CINNAMON PO Take 1 tablet by mouth daily.  . fluticasone (FLONASE) 50 MCG/ACT nasal spray Place 2 sprays into both nostrils daily as needed for allergies.  . hydrochlorothiazide (HYDRODIURIL) 25 MG tablet Take 0.5 tablets (12.5 mg total) by mouth daily.  Marland Kitchen levothyroxine (SYNTHROID) 88 MCG tablet TAKE 1 TABLET BY MOUTH EVERY DAY  . losartan (COZAAR) 100 MG tablet TAKE 1 TABLET BY MOUTH EVERY DAY  . metFORMIN (GLUCOPHAGE) 500 MG tablet TAKE 1 TABLET BY MOUTH 2 TIMES DAILY WITH A MEAL.  Marland Kitchen Omega-3 Fatty Acids (FISH OIL OMEGA-3 PO) Take 1 capsule by mouth daily.  Marland Kitchen omeprazole (PRILOSEC) 20 MG capsule TAKE 1 CAPSULE BY MOUTH EVERY DAY  . potassium chloride (KLOR-CON) 10 MEQ tablet Take 1 tablet (10 mEq total) by mouth daily.  . rosuvastatin (CRESTOR) 20 MG tablet TAKE 1 TABLET BY MOUTH EVERY DAY  . tiZANidine (ZANAFLEX) 4 MG tablet TAKE 1 TABLET BY MOUTH AT BEDTIME AS NEEDED FOR MUSCLE SPASMS.  . Turmeric (QC TUMERIC COMPLEX PO) Take 1 tablet by mouth daily.  . [DISCONTINUED] fluticasone (FLONASE) 50 MCG/ACT nasal spray SPRAY 2 SPRAYS INTO EACH NOSTRIL EVERY DAY (Patient taking differently: Place 2 sprays into both nostrils daily as needed for allergies.)  . [DISCONTINUED] buPROPion (WELLBUTRIN XL) 150 MG 24 hr tablet TAKE 1 TABLET BY MOUTH EVERY DAY (Patient not taking: No sig reported)  . [DISCONTINUED] FLUoxetine (PROZAC) 40 MG capsule TAKE 1 CAPSULE BY MOUTH EVERY DAY (Patient not taking: No sig reported)   No facility-administered encounter medications on file as of 10/03/2020.

## 2020-10-03 NOTE — Patient Instructions (Signed)
Check your blood pressure once a day, and vary the time of day when you check it.

## 2020-10-06 ENCOUNTER — Other Ambulatory Visit: Payer: Self-pay

## 2020-10-06 ENCOUNTER — Ambulatory Visit (HOSPITAL_COMMUNITY)
Admission: RE | Admit: 2020-10-06 | Discharge: 2020-10-06 | Disposition: A | Discharge status: Home / Self Care | Payer: PPO | Source: Ambulatory Visit | Attending: Family Medicine | Admitting: Family Medicine

## 2020-10-06 ENCOUNTER — Ambulatory Visit (HOSPITAL_BASED_OUTPATIENT_CLINIC_OR_DEPARTMENT_OTHER)
Admission: RE | Admit: 2020-10-06 | Discharge: 2020-10-06 | Disposition: A | Discharge status: Home / Self Care | Payer: PPO | Source: Ambulatory Visit | Attending: Family Medicine | Admitting: Family Medicine

## 2020-10-06 DIAGNOSIS — I6523 Occlusion and stenosis of bilateral carotid arteries: Secondary | ICD-10-CM | POA: Diagnosis not present

## 2020-10-06 DIAGNOSIS — H5461 Unqualified visual loss, right eye, normal vision left eye: Secondary | ICD-10-CM | POA: Insufficient documentation

## 2020-10-06 DIAGNOSIS — G453 Amaurosis fugax: Secondary | ICD-10-CM | POA: Insufficient documentation

## 2020-10-06 LAB — ECHOCARDIOGRAM COMPLETE
Area-P 1/2: 2.97 cm2
S' Lateral: 2.6 cm

## 2020-10-06 IMAGING — US US CAROTID DUPLEX BILAT
1 series · 13 of 24 positions shown · non-contrast
Comparison: None.

CLINICAL DATA: 69-year-old female with history of vision loss in
right eye.

EXAM:
BILATERAL CAROTID DUPLEX ULTRASOUND
TECHNIQUE: Gray scale imaging, color Doppler and duplex ultrasound were
performed of bilateral carotid and vertebral arteries in the neck.

[Series 1: us carotid bilateral · 13 of 68 slices shown]
[im 1/68]
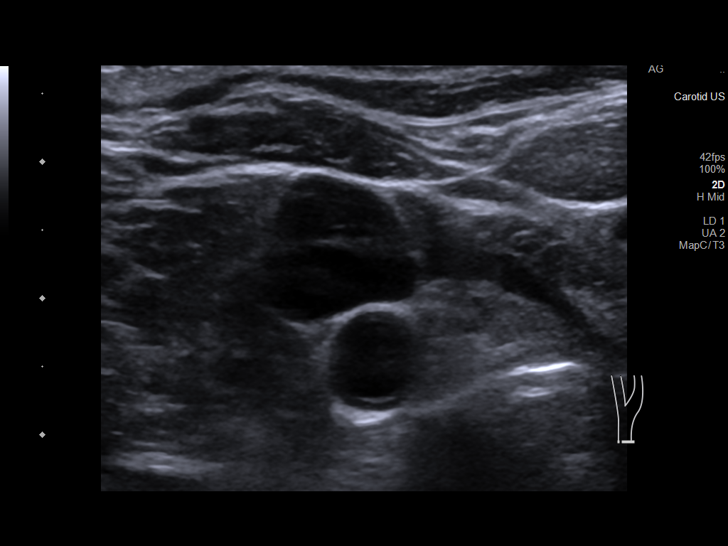
[im 6/68]
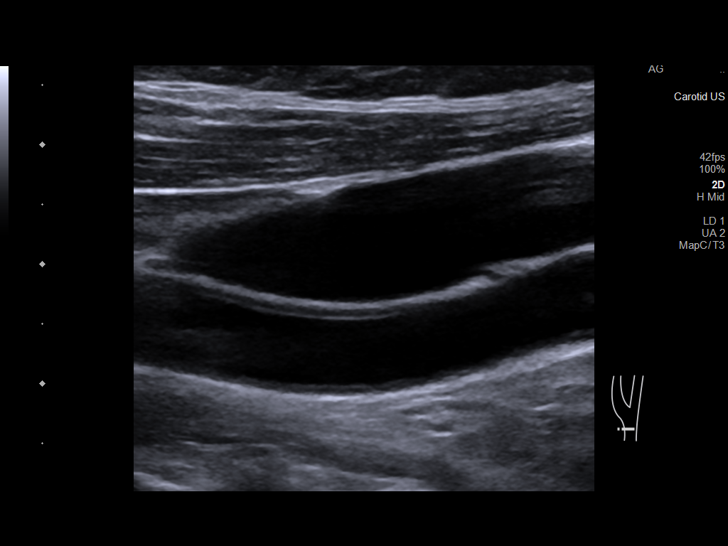
[im 12/68]
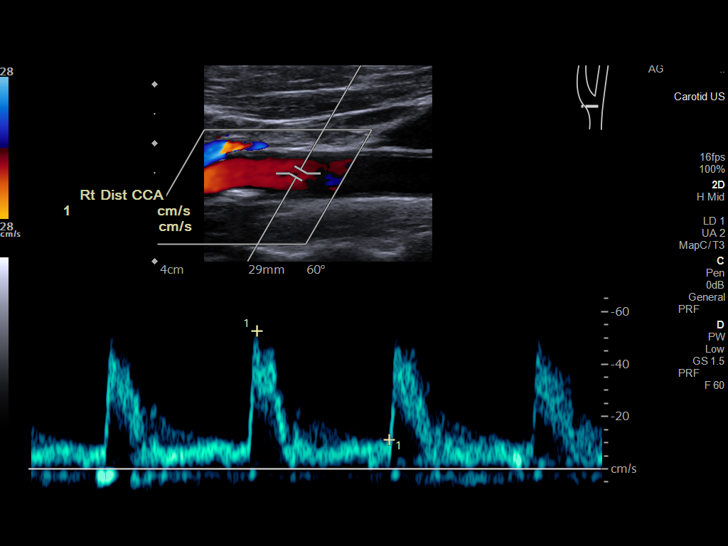
[im 18/68]
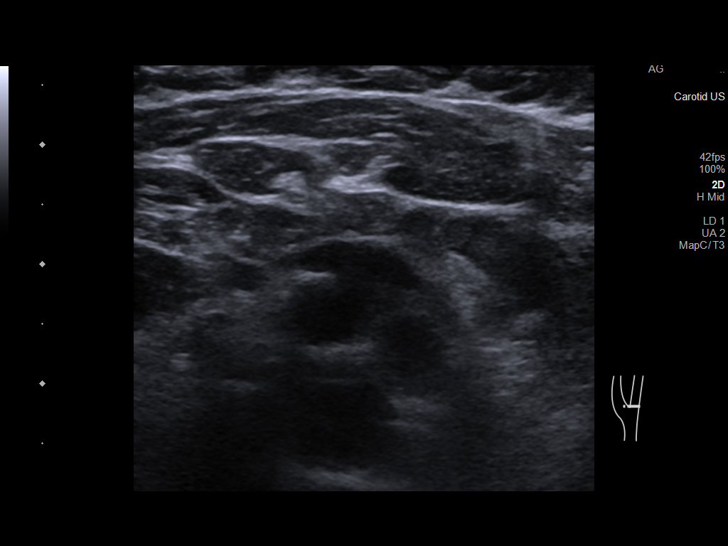
[im 24/68]
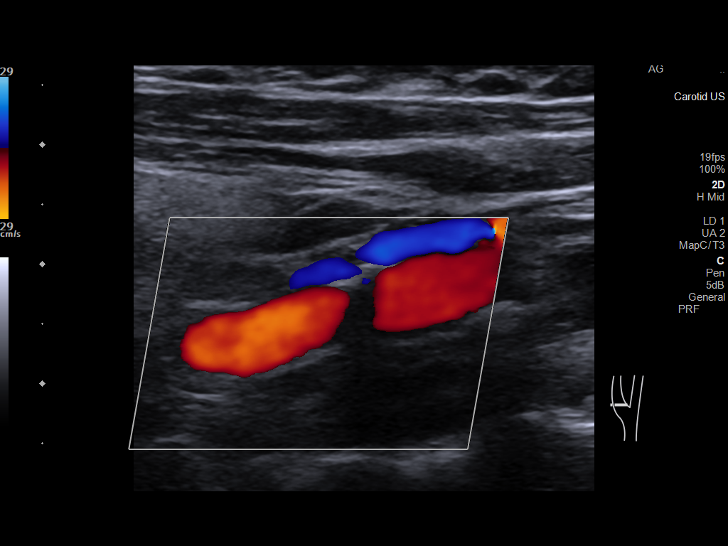
[im 30/68]
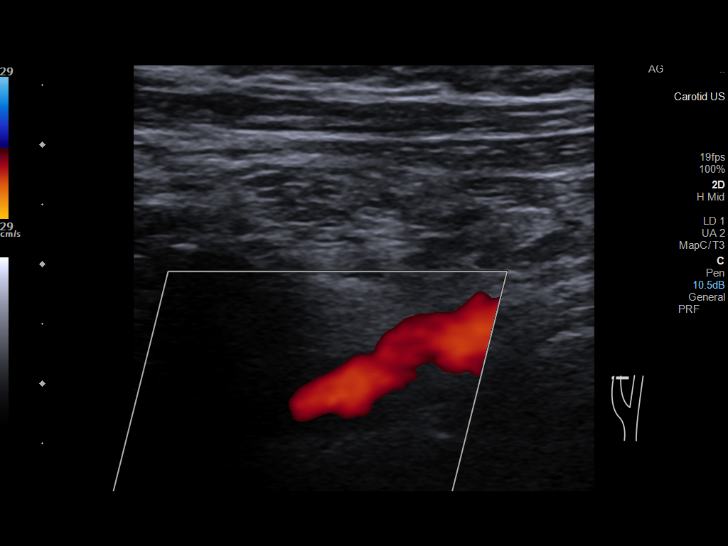
[im 35/68]
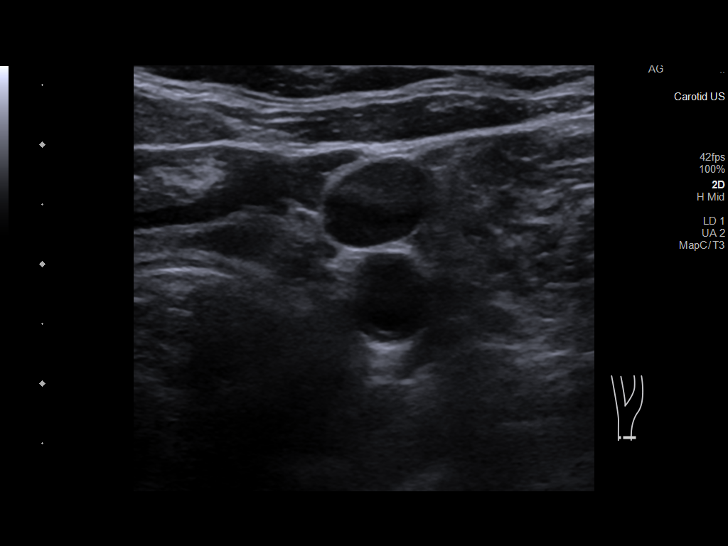
[im 38/68]
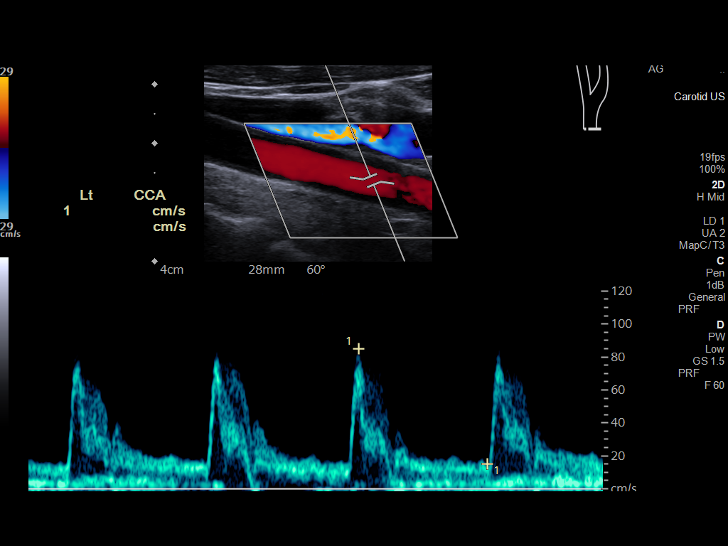
[im 44/68]
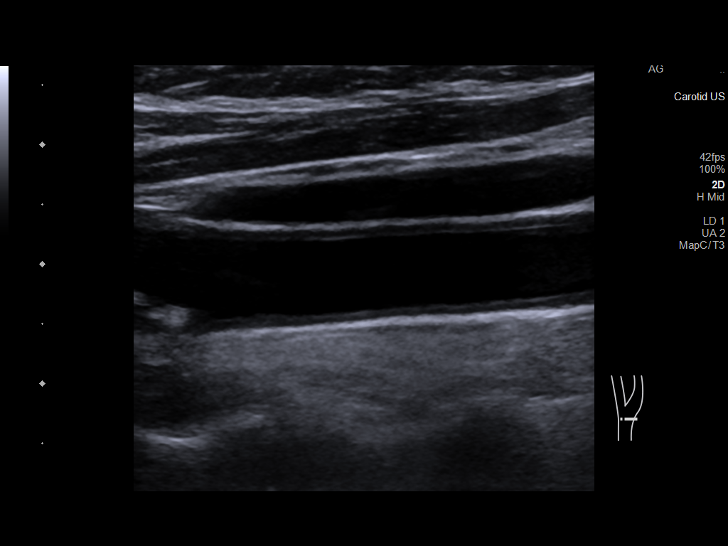
[im 50/68]
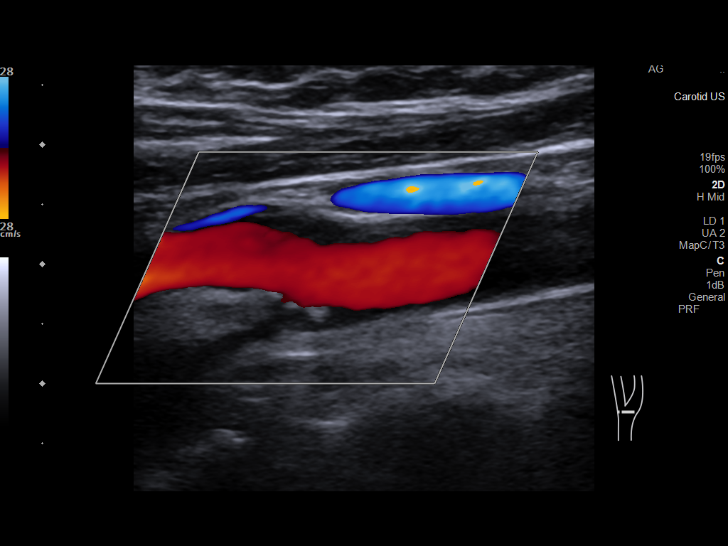
[im 56/68]
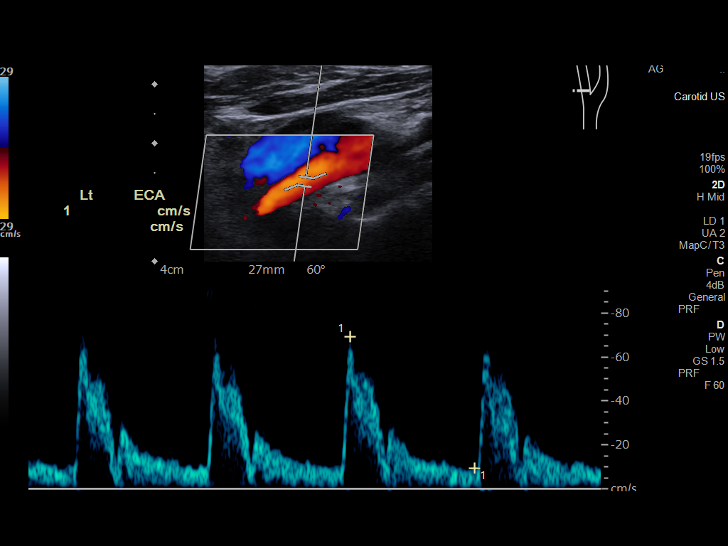
[im 62/68]
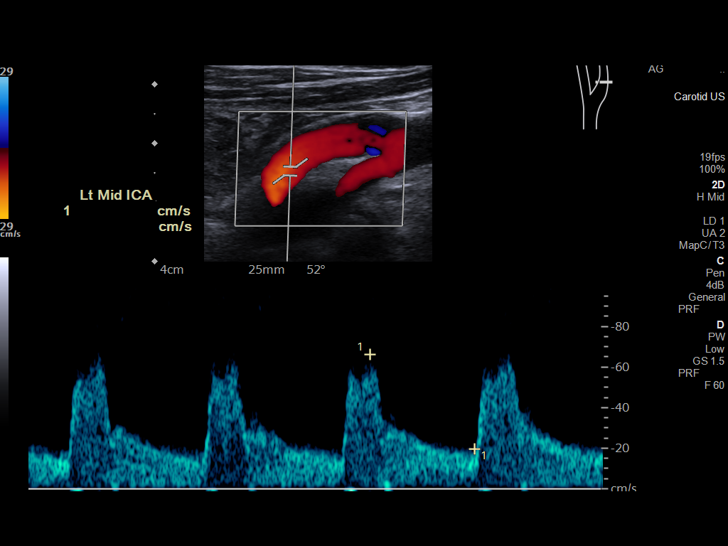
[im 68/68]
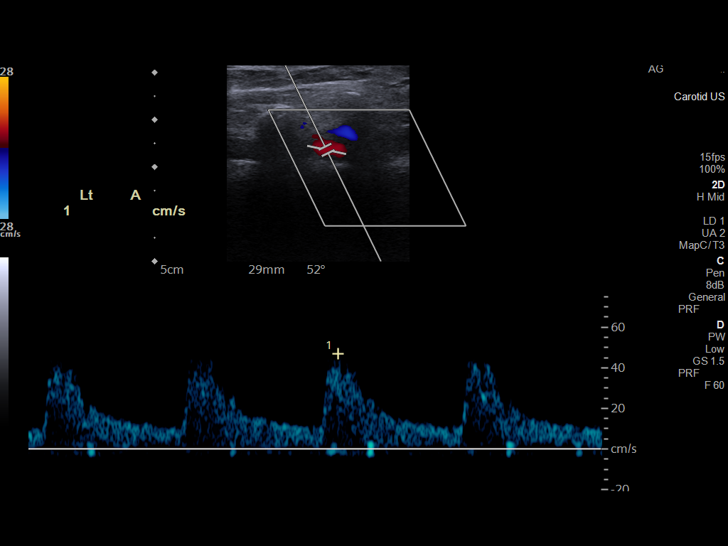

[13 of 24 positions shown; findings below may reference images not displayed]

FINDINGS: Criteria: Quantification of carotid stenosis is based on velocity
parameters that correlate the residual internal carotid diameter
with NASCET-based stenosis levels, using the diameter of the distal
internal carotid lumen as the denominator for stenosis measurement.

The following velocity measurements were obtained:

RIGHT

ICA: Peak systolic velocity 70 cm/sec, End diastolic velocity 20
cm/sec

CCA: Peak systolic velocity 88 cm/sec

SYSTOLIC ICA/CCA RATIO:

ECA: Peak systolic velocity 54 cm/sec

LEFT

ICA: Peak systolic velocity 74 cm/sec, End diastolic velocity 24
cm/sec

CCA: 81 cm/sec

SYSTOLIC ICA/CCA RATIO:

ECA: 69 cm/sec

RIGHT CAROTID ARTERY: Mild multifocal atherosclerotic plaque
formation. No significant tortuosity. Normal low resistance
waveforms.

RIGHT VERTEBRAL ARTERY:  Antegrade flow.

LEFT CAROTID ARTERY: Mild multifocal atherosclerotic plaque
formation. No significant tortuosity. Normal low resistance
waveforms.

LEFT VERTEBRAL ARTERY:  Antegrade flow.

Upper extremity non-invasive blood pressures:

Not obtained.
IMPRESSION: 1. Right carotid artery system: Less than 50% stenosis secondary to
mild multifocal atherosclerotic plaque formation.

2. Left carotid artery system: Less than 50% stenosis secondary to
mild multifocal atherosclerotic plaque formation.

3.  Vertebral artery system: Patent with antegrade flow bilaterally.

## 2020-10-06 NOTE — Progress Notes (Signed)
*  PRELIMINARY RESULTS* Echocardiogram 2D Echocardiogram has been performed.  [name] 10/06/2020, 2:45 PM

## 2020-10-12 ENCOUNTER — Other Ambulatory Visit: Payer: Self-pay | Admitting: Family Medicine

## 2020-10-13 ENCOUNTER — Encounter: Payer: PPO | Admitting: Family Medicine

## 2020-10-16 ENCOUNTER — Other Ambulatory Visit: Payer: Self-pay

## 2020-10-16 ENCOUNTER — Encounter: Payer: Self-pay | Admitting: Emergency Medicine

## 2020-10-16 ENCOUNTER — Ambulatory Visit
Admission: EM | Admit: 2020-10-16 | Discharge: 2020-10-16 | Disposition: A | Discharge status: Home / Self Care | Payer: PPO

## 2020-10-16 DIAGNOSIS — L600 Ingrowing nail: Secondary | ICD-10-CM

## 2020-10-16 DIAGNOSIS — M79675 Pain in left toe(s): Secondary | ICD-10-CM

## 2020-10-16 NOTE — Discharge Instructions (Addendum)
Follow up with podiatry  Follow up with primary care if symptoms are persisting.  Follow up in the ER for high fever, trouble swallowing, trouble breathing, other concerning symptoms.

## 2020-10-16 NOTE — ED Provider Notes (Signed)
[facility]    CSN: [id number] Arrival date & time: 10/16/20  1156      History   Chief Complaint No chief complaint on file.   HPI April Ross is a 70 y.o. female.   Reports that she thinks that she has an ingrown toenail to the medial aspect of her left great toe.  Reports that this been going on for the last 2 to 3 days.  Has not attempted treatment at home.  Denies symptoms in the past.  Denies headache, cough, shortness of breath, nausea, vomiting, diarrhea, [place], fever, other symptoms.  ROS per HPI  The history is provided by the patient.    Past Medical History:  Diagnosis Date  . Allergy   . Arthritis   . CRD (chronic renal disease)    stage 3  . Depression   . Diabetes mellitus without complication ([place])   . GERD (gastroesophageal reflux disease)   . Hyperlipidemia   . Hypertension   . LBBB (left bundle branch block)    a. diagnosed in 10/2017 - echo showed a preserved EF of 55-60% and NST was low-risk showing no evidence of ischemia.   . Osteoporosis   . polyps in colon   . Thyroid disease   . Urinary tract infection     Patient Active Problem List   Diagnosis Date Noted  . ETD (Eustachian tube dysfunction), right 08/04/2019  . Chronic bilateral thoracic back pain 11/21/2018  . Type 2 diabetes mellitus with nephropathy ([place]) 10/27/2017  . Tobacco abuse 10/27/2017  . Nonspecific chest pain   . Dizziness 05/16/2017  . Osteoporosis 05/24/2016  . CKD (chronic kidney disease) stage 3, GFR 30-59 ml/min (H[facility]) 05/08/2016  . Pure hypercholesterolemia 01/12/2016  . Hypothyroidism 01/12/2016  . Essential hypertension, benign 01/12/2016  . Allergic rhinitis 01/12/2016  . Moderate major depression, single episode ([place]) 01/12/2016  . Chronic insomnia 01/12/2016  . GERD (gastroesophageal reflux disease) 01/12/2016    Past Surgical History:  Procedure Laterality Date  . ABDOMINAL HYSTERECTOMY    . APPENDECTOMY      OB History   No  obstetric history on file.      Home Medications    Prior to Admission medications   Medication Sig Start Date End Date Taking? Authorizing Provider  alendronate (FOSAMAX) 70 MG tablet TAKE 1 TABLET BY MOUTH EVERY 7 DAYS WITH A FULL GLASS OF WATER ON AN EMPTY STOMACH 08/23/20   Bedsole, Amy E, MD  aspirin EC 81 MG tablet Take 81 mg by mouth daily. Swallow whole.    [provider]  Calcium Carb-Cholecalciferol (CALCIUM-VITAMIN D) 500-200 MG-UNIT tablet Take 1 tablet by mouth daily.    [provider]  cetirizine (ZYRTEC) 10 MG tablet Take 10 mg by mouth at bedtime as needed for allergies.    [provider]  CINNAMON PO Take 1 tablet by mouth daily.    [provider]  fluticasone (FLONASE) 50 MCG/ACT nasal spray Place 2 sprays into both nostrils daily as needed for allergies.    [provider]  hydrochlorothiazide (HYDRODIURIL) 25 MG tablet Take 0.5 tablets (12.5 mg total) by mouth daily. 09/28/20   Daleen Bo, MD  levothyroxine (SYNTHROID) 88 MCG tablet TAKE 1 TABLET BY MOUTH EVERY DAY 09/05/20   Bedsole, Amy E, MD  losartan (COZAAR) 100 MG tablet TAKE 1 TABLET BY MOUTH EVERY DAY 06/30/20   Bedsole, Amy E, MD  metFORMIN (GLUCOPHAGE) 500 MG tablet TAKE 1 TABLET BY MOUTH 2 TIMES  DAILY WITH A MEAL. 08/22/20   Bedsole, Amy E, MD  NEOMYCIN-POLYMYXIN-HYDROCORTISONE (CORTISPORIN) 1 % SOLN OTIC solution Apply 1-2 drops to toe BID after soaking 10/18/20   Hyatt, Max T, DPM  Omega-3 Fatty Acids (FISH OIL OMEGA-3 PO) Take 1 capsule by mouth daily.    [provider]  omeprazole (PRILOSEC) 20 MG capsule TAKE 1 CAPSULE BY MOUTH EVERY DAY 09/05/20   Bedsole, Amy E, MD  potassium chloride (KLOR-CON) 10 MEQ tablet Take 1 tablet (10 mEq total) by mouth daily. 09/28/20   Daleen Bo, MD  rosuvastatin (CRESTOR) 20 MG tablet TAKE 1 TABLET BY MOUTH EVERY DAY 10/12/20   Bedsole, Amy E, MD  tiZANidine (ZANAFLEX) 4 MG tablet TAKE 1 TABLET BY MOUTH AT  BEDTIME AS NEEDED FOR MUSCLE SPASMS. 08/18/19   Bedsole, Amy E, MD  Turmeric (QC TUMERIC COMPLEX PO) Take 1 tablet by mouth daily.    [provider]    Family History Family History  Problem Relation Age of Onset  . Alzheimer's disease Mother   . Hyperlipidemia Mother   . Hypertension Mother   . Asthma Maternal Grandmother   . Diabetes Maternal Grandfather   . Alcohol abuse Maternal Grandfather   . Hyperlipidemia Maternal Grandfather   . Stroke Maternal Grandfather   . Hypertension Maternal Grandfather   . Diabetes Paternal Grandmother   . Alcohol abuse Maternal Uncle   . Cholecystitis Maternal Uncle   . Arthritis Maternal Uncle     Social History Social History   Tobacco Use  . Smoking status: Current Every Day Smoker    Packs/day: 0.25    Types: Cigarettes  . Smokeless tobacco: Never Used  Vaping Use  . Vaping Use: Never used  Substance Use Topics  . Alcohol use: Yes    Alcohol/week: 0.0 standard drinks    Comment: occ glass of wine or mix drink  . Drug use: No     Allergies   Acetaminophen, Asa [aspirin], Ibuprofen, and Other   Review of Systems Review of Systems   Physical Exam Triage Vital Signs ED Triage Vitals  Enc Vitals Group     BP 10/16/20 1226 (!) 151/79     Pulse Rate 10/16/20 1226 (!) 101     Resp 10/16/20 1226 16     Temp 10/16/20 1226 99.2 F (37.3 C)     Temp Source 10/16/20 1226 Oral     SpO2 10/16/20 1226 96 %     Weight 10/16/20 1239 155 lb (70.3 kg)     Height 10/16/20 1239 5' 2.5" (1.588 m)     Head Circumference --      Peak Flow --      Pain Score 10/16/20 1239 2     Pain Loc --      Pain Edu? --      Excl. in [place]? --    No data found.  Updated Vital Signs BP (!) 151/79 (BP Location: Right Arm)   Pulse (!) 101   Temp 99.2 F (37.3 C) (Oral)   Resp 16   Ht 5' 2.5" (1.588 m)   Wt 155 lb (70.3 kg)   SpO2 96%   BMI 27.90 kg/m   Visual Acuity Right Eye Distance:   Left Eye Distance:   Bilateral Distance:     Right Eye Near:   Left Eye Near:    Bilateral Near:     Physical Exam Vitals and nursing note reviewed.  Constitutional:      General: She is  not in acute distress.    Appearance: Normal appearance. She is well-developed and well-nourished.  HENT:     Head: Normocephalic and atraumatic.  Eyes:     Conjunctiva/sclera: Conjunctivae normal.  Cardiovascular:     Rate and Rhythm: Normal rate and regular rhythm.     Heart sounds: No murmur heard.   Pulmonary:     Effort: Pulmonary effort is normal. No respiratory distress.     Breath sounds: Normal breath sounds.  Abdominal:     Palpations: Abdomen is soft.     Tenderness: There is no abdominal tenderness.  Musculoskeletal:        General: No edema. Normal range of motion.     Cervical back: Normal range of motion and neck supple.       Feet:  Feet:     Left foot:     Toenail Condition: Left toenails are abnormally thick and ingrown.     Comments: Area of ingrown toenail, erythema, tenderness Skin:    General: Skin is warm and dry.     Capillary Refill: Capillary refill takes less than 2 seconds.  Neurological:     General: No focal deficit present.     Mental Status: She is alert and oriented to person, place, and time.  Psychiatric:        Mood and Affect: Mood and affect and mood normal.        Behavior: Behavior normal.        Thought Content: Thought content normal.      UC Treatments / Results  Labs (all labs ordered are listed, but only abnormal results are displayed) Labs Reviewed - No data to display  EKG   Radiology No results found.  Procedures Procedures (including critical care time)  Medications Ordered in UC Medications - No data to display  Initial Impression / Assessment and Plan / UC Course  I have reviewed the triage vital signs and the nursing notes.  Pertinent labs & imaging results that were available during my care of the patient were reviewed by me and considered in my medical  decision making (see chart for details).     Ingrown left great toenail Left great toe pain  Attempted unsuccessfully to remove ingrown area of the toenail Information for podiatry given Patient states that she will follow up there   Final Clinical Impressions(s) / UC Diagnoses   Final diagnoses:  Ingrown left big toenail  Great toe pain, left     Discharge Instructions     Follow up with podiatry  Follow up with primary care if symptoms are persisting.  Follow up in the ER for high fever, trouble swallowing, trouble breathing, other concerning symptoms.     ED Prescriptions    None     PDMP not reviewed this encounter.   Faustino Congress, NP 10/19/20 [phone]

## 2020-10-16 NOTE — ED Triage Notes (Signed)
Ingrown toe nail on left great toe since a couple of days.

## 2020-10-18 ENCOUNTER — Other Ambulatory Visit: Payer: Self-pay

## 2020-10-18 ENCOUNTER — Encounter: Payer: Self-pay | Admitting: Podiatry

## 2020-10-18 ENCOUNTER — Ambulatory Visit (INDEPENDENT_AMBULATORY_CARE_PROVIDER_SITE_OTHER): Payer: PPO | Admitting: Podiatry

## 2020-10-18 DIAGNOSIS — L600 Ingrowing nail: Secondary | ICD-10-CM

## 2020-10-18 MED ORDER — NEOMYCIN-POLYMYXIN-HC 1 % OT SOLN: OTIC | 1 refills | Status: DC

## 2020-10-18 NOTE — Patient Instructions (Signed)

## 2020-10-19 NOTE — Progress Notes (Signed)
Subjective:  Patient ID: April Ross, female    DOB: May 22, 1951,  MRN: [id number] HPI Chief Complaint  Patient presents with  . Toe Pain    Hallux left - lateral border - tender x few weeks, severe throb over the last 3 days, went to Urgent Care and they soaked it and trimmed nail and skin-no help  . Diabetes    Last a1c was 6.6  . New Patient (Initial Visit)    70 y.o. female presents with the above complaint.   ROS: Denies fever chills nausea vomiting muscle aches pains calf pain back pain chest pain shortness of breath.  Past Medical History:  Diagnosis Date  . Allergy   . Arthritis   . CRD (chronic renal disease)    stage 3  . Depression   . Diabetes mellitus without complication ([place])   . GERD (gastroesophageal reflux disease)   . Hyperlipidemia   . Hypertension   . LBBB (left bundle branch block)    a. diagnosed in 10/2017 - echo showed a preserved EF of 55-60% and NST was low-risk showing no evidence of ischemia.   . Osteoporosis   . polyps in colon   . Thyroid disease   . Urinary tract infection    Past Surgical History:  Procedure Laterality Date  . ABDOMINAL HYSTERECTOMY    . APPENDECTOMY      Current Outpatient Medications:  .  NEOMYCIN-POLYMYXIN-HYDROCORTISONE (CORTISPORIN) 1 % SOLN OTIC solution, Apply 1-2 drops to toe BID after soaking, Disp: 10 mL, Rfl: 1 .  alendronate (FOSAMAX) 70 MG tablet, TAKE 1 TABLET BY MOUTH EVERY 7 DAYS WITH A FULL GLASS OF WATER ON AN EMPTY STOMACH, Disp: 12 tablet, Rfl: 0 .  aspirin EC 81 MG tablet, Take 81 mg by mouth daily. Swallow whole., Disp: , Rfl:  .  Calcium Carb-Cholecalciferol (CALCIUM-VITAMIN D) 500-200 MG-UNIT tablet, Take 1 tablet by mouth daily., Disp: , Rfl:  .  cetirizine (ZYRTEC) 10 MG tablet, Take 10 mg by mouth at bedtime as needed for allergies., Disp: , Rfl:  .  CINNAMON PO, Take 1 tablet by mouth daily., Disp: , Rfl:  .  fluticasone (FLONASE) 50 MCG/ACT nasal spray, Place 2 sprays into both nostrils  daily as needed for allergies., Disp: , Rfl:  .  hydrochlorothiazide (HYDRODIURIL) 25 MG tablet, Take 0.5 tablets (12.5 mg total) by mouth daily., Disp: 30 tablet, Rfl: 0 .  levothyroxine (SYNTHROID) 88 MCG tablet, TAKE 1 TABLET BY MOUTH EVERY DAY, Disp: 90 tablet, Rfl: 0 .  losartan (COZAAR) 100 MG tablet, TAKE 1 TABLET BY MOUTH EVERY DAY, Disp: 90 tablet, Rfl: 1 .  metFORMIN (GLUCOPHAGE) 500 MG tablet, TAKE 1 TABLET BY MOUTH 2 TIMES DAILY WITH A MEAL., Disp: 180 tablet, Rfl: 0 .  Omega-3 Fatty Acids (FISH OIL OMEGA-3 PO), Take 1 capsule by mouth daily., Disp: , Rfl:  .  omeprazole (PRILOSEC) 20 MG capsule, TAKE 1 CAPSULE BY MOUTH EVERY DAY, Disp: 90 capsule, Rfl: 0 .  potassium chloride (KLOR-CON) 10 MEQ tablet, Take 1 tablet (10 mEq total) by mouth daily., Disp: 30 tablet, Rfl: 0 .  rosuvastatin (CRESTOR) 20 MG tablet, TAKE 1 TABLET BY MOUTH EVERY DAY, Disp: 90 tablet, Rfl: 0 .  tiZANidine (ZANAFLEX) 4 MG tablet, TAKE 1 TABLET BY MOUTH AT BEDTIME AS NEEDED FOR MUSCLE SPASMS., Disp: 90 tablet, Rfl: 0 .  Turmeric (QC TUMERIC COMPLEX PO), Take 1 tablet by mouth daily., Disp: , Rfl:   Allergies  Allergen Reactions  .  Acetaminophen     Eduardo   . Asa [Aspirin]     [place] groin and axilla area  . Ibuprofen   . Other Jicha    Mycolog-II & HRT Hormone patch   Review of Systems Objective:  There were no vitals filed for this visit.  General: Well developed, nourished, in no acute distress, alert and oriented x3   Dermatological: Skin is warm, dry and supple bilateral. Nails x 10 are well maintained; remaining integument appears unremarkable at this time. There are no open sores, no preulcerative lesions, no [place] or signs of infection present.  Ingrown toenail fibular border hallux left no erythema purulence malodor  Vascular: Dorsalis Pedis artery and Posterior Tibial artery pedal pulses are 2/4 bilateral with immedate capillary fill time. Pedal hair growth present. No varicosities and no lower  extremity edema present bilateral.   Neruologic: Grossly intact via light touch bilateral. Vibratory intact via tuning fork bilateral. Protective threshold with Semmes Wienstein monofilament intact to all pedal sites bilateral. Patellar and Achilles deep tendon reflexes 2+ bilateral. No Babinski or clonus noted bilateral.   Musculoskeletal: No gross boney pedal deformities bilateral. No pain, crepitus, or limitation noted with foot and ankle range of motion bilateral. Muscular strength 5/5 in all groups tested bilateral.  Gait: Unassisted, Nonantalgic.    Radiographs:  None taken  Assessment & Plan:   Assessment: Ingrown nail hallux left fibular border  Plan: Chemical matricectomy was performed today after local anesthetic was administered.  She tolerated procedure well without complications.  She was given both oral and written home-going instructions for the care and soaking of the toe as well as a prescription for Cortisporin Otic to be applied twice daily after soaking.  And I will follow-up with her in 2 weeks.  Should she have questions or concerns she will notify us immediately.     Wendell Fiebig T. [place], [place]

## 2020-10-27 NOTE — Telephone Encounter (Signed)
No refill request from [facility] for Lasix has been sent to our office.

## 2020-11-01 ENCOUNTER — Ambulatory Visit: Payer: PPO | Admitting: Podiatry

## 2020-11-01 ENCOUNTER — Encounter: Payer: Self-pay | Admitting: Podiatry

## 2020-11-01 ENCOUNTER — Other Ambulatory Visit: Payer: Self-pay

## 2020-11-01 DIAGNOSIS — L03032 Cellulitis of left toe: Secondary | ICD-10-CM

## 2020-11-01 MED ORDER — CEPHALEXIN 500 MG PO CAPS: 500.0000 mg | ORAL_CAPSULE | Freq: Three times a day (TID) | ORAL | 0 refills | Status: DC

## 2020-11-01 NOTE — Progress Notes (Signed)
And presents today for follow-up of her matrixectomy hallux left fibular border.  States that this time has been killing me she states that only have 2-3 pairs of shoes that I can wear a narrow boot.  She states that she started out soaking Epson salts and warm water but it turned her foot orange and made it itch so she went to Epson salts 1 full cup of Epson salt with 6 cups of water.  Objective: Vital signs are stable she is alert and oriented x3.  Pulses are palpable.  Mild erythema just at the proximal nail fold and some very small mount of a serosanguineous drainage no purulence no malodor.  Tender to the touch.  Assessment: Mild paronychia proximal lateral nail fold hallux left.  Status post matrixectomy hallux left.  Plan: At this point_to start her on Keflex 500 mg 1 p.o. twice daily encouraged her to continue to soak Epson salts and warm water twice daily either Neosporin cream or Corticosporin otic but not both.  I will follow-up with her in 2 weeks should she have questions or concerns or feel that this is worsening she will notify us immediately.

## 2020-11-07 LAB — HM DIABETES FOOT EXAM

## 2020-11-11 ENCOUNTER — Other Ambulatory Visit: Payer: Self-pay | Admitting: Family Medicine

## 2020-11-14 ENCOUNTER — Telehealth: Payer: Self-pay | Admitting: Family Medicine

## 2020-11-14 DIAGNOSIS — E1121 Type 2 diabetes mellitus with diabetic nephropathy: Secondary | ICD-10-CM

## 2020-11-14 DIAGNOSIS — E039 Hypothyroidism, unspecified: Secondary | ICD-10-CM

## 2020-11-14 NOTE — Telephone Encounter (Signed)
-----   Message from Ellamae Sia sent at 11/10/2020  4:30 PM EST ----- Regarding: Lab orders for Friday, 2.11.22 Patient is scheduled for CPX labs, please order future labs, Thanks , Karna Christmas

## 2020-11-17 ENCOUNTER — Other Ambulatory Visit: Payer: Self-pay | Admitting: Family Medicine

## 2020-11-17 ENCOUNTER — Ambulatory Visit: Payer: PPO | Admitting: Podiatry

## 2020-11-17 ENCOUNTER — Encounter: Payer: Self-pay | Admitting: Podiatry

## 2020-11-17 ENCOUNTER — Other Ambulatory Visit: Payer: Self-pay

## 2020-11-17 DIAGNOSIS — Z9889 Other specified postprocedural states: Secondary | ICD-10-CM

## 2020-11-17 DIAGNOSIS — L03032 Cellulitis of left toe: Secondary | ICD-10-CM

## 2020-11-17 NOTE — Progress Notes (Signed)
She presents today for follow-up of her paronychia hallux left.  States that it seems to be doing much better.  She states a stop soaking because the scab developed.  She refers to the hallux left fibular border.  She denies fever chills nausea vomiting muscle aches and pains.  Objective: Vital signs are stable alert oriented x3.  Pulses are palpable.  There is some mild erythema to the proximal lateral nail fold.  There is no cellulitis or odor.  There is some dried drainage which I removed with a Q-tip and it does demonstrate that it is still trying to drain from the proximal lateral nail fold.  Assessment: Mild paronychia persists though it has much improved at this point.  Plan: I am going to recommend that she continue to soak every other day Epson salts and warm water continue to apply the Cortisporin otic or the Neosporin to the toe every day cover during the day every day and leave open at bedtime.  She will follow-up with me if needed.

## 2020-11-18 ENCOUNTER — Other Ambulatory Visit (INDEPENDENT_AMBULATORY_CARE_PROVIDER_SITE_OTHER): Payer: PPO

## 2020-11-18 ENCOUNTER — Other Ambulatory Visit: Payer: Self-pay

## 2020-11-18 DIAGNOSIS — E039 Hypothyroidism, unspecified: Secondary | ICD-10-CM | POA: Diagnosis not present

## 2020-11-18 DIAGNOSIS — E1121 Type 2 diabetes mellitus with diabetic nephropathy: Secondary | ICD-10-CM | POA: Diagnosis not present

## 2020-11-18 LAB — COMPREHENSIVE METABOLIC PANEL
ALT: 29 U/L (ref 0–35)
AST: 44 U/L — ABNORMAL HIGH (ref 0–37)
Albumin: 4.5 g/dL (ref 3.5–5.2)
Alkaline Phosphatase: 78 U/L (ref 39–117)
BUN: 29 mg/dL — ABNORMAL HIGH (ref 6–23)
CO2: 30 mEq/L (ref 19–32)
Calcium: 10.8 mg/dL — ABNORMAL HIGH (ref 8.4–10.5)
Chloride: 93 mEq/L — ABNORMAL LOW (ref 96–112)
Creatinine, Ser: 1.77 mg/dL — ABNORMAL HIGH (ref 0.40–1.20)
GFR: 28.52 mL/min — ABNORMAL LOW (ref >60.00)
Glucose, Bld: 454 mg/dL — ABNORMAL HIGH (ref 70–99)
Potassium: 4.6 mEq/L (ref 3.5–5.1)
Sodium: 133 mEq/L — ABNORMAL LOW (ref 135–145)
Total Bilirubin: 0.6 mg/dL (ref 0.2–1.2)
Total Protein: 7.2 g/dL (ref 6.0–8.3)

## 2020-11-18 LAB — T4, FREE: Free T4: 1.22 ng/dL (ref 0.60–1.60)

## 2020-11-18 LAB — LIPID PANEL
Cholesterol: 197 mg/dL (ref 0–200)
HDL: 28.8 mg/dL — ABNORMAL LOW (ref >39.00)
Total CHOL/HDL Ratio: 7
Triglycerides: 638 mg/dL — ABNORMAL HIGH (ref 0.0–149.0)

## 2020-11-18 LAB — T3, FREE: T3, Free: 3.1 pg/mL (ref 2.3–4.2)

## 2020-11-18 LAB — LDL CHOLESTEROL, DIRECT: Direct LDL: 92 mg/dL

## 2020-11-18 LAB — TSH: TSH: 2.89 u[IU]/mL (ref 0.35–4.50)

## 2020-11-21 LAB — HEMOGLOBIN A1C: Hgb A1c MFr Bld: 13.6 % — ABNORMAL HIGH (ref 4.6–6.5)

## 2020-11-22 NOTE — Progress Notes (Signed)
No critical labs need to be addressed urgently. We will discuss labs in detail at upcoming office visit.   

## 2020-11-24 ENCOUNTER — Other Ambulatory Visit: Payer: Self-pay

## 2020-11-24 ENCOUNTER — Other Ambulatory Visit: Payer: Self-pay | Admitting: Family Medicine

## 2020-11-24 ENCOUNTER — Ambulatory Visit (INDEPENDENT_AMBULATORY_CARE_PROVIDER_SITE_OTHER): Payer: PPO | Admitting: Family Medicine

## 2020-11-24 VITALS — BP 151/91 | HR 84 | Temp 97.4°F | Ht 62.0 in | Wt 151.8 lb

## 2020-11-24 DIAGNOSIS — G8929 Other chronic pain: Secondary | ICD-10-CM

## 2020-11-24 DIAGNOSIS — F321 Major depressive disorder, single episode, moderate: Secondary | ICD-10-CM | POA: Diagnosis not present

## 2020-11-24 DIAGNOSIS — N1831 Chronic kidney disease, stage 3a: Secondary | ICD-10-CM | POA: Diagnosis not present

## 2020-11-24 DIAGNOSIS — M810 Age-related osteoporosis without current pathological fracture: Secondary | ICD-10-CM

## 2020-11-24 DIAGNOSIS — E039 Hypothyroidism, unspecified: Secondary | ICD-10-CM | POA: Diagnosis not present

## 2020-11-24 DIAGNOSIS — M546 Pain in thoracic spine: Secondary | ICD-10-CM | POA: Diagnosis not present

## 2020-11-24 DIAGNOSIS — I152 Hypertension secondary to endocrine disorders: Secondary | ICD-10-CM

## 2020-11-24 DIAGNOSIS — Z720 Tobacco use: Secondary | ICD-10-CM | POA: Diagnosis not present

## 2020-11-24 DIAGNOSIS — E1169 Type 2 diabetes mellitus with other specified complication: Secondary | ICD-10-CM | POA: Diagnosis not present

## 2020-11-24 DIAGNOSIS — Z1231 Encounter for screening mammogram for malignant neoplasm of breast: Secondary | ICD-10-CM

## 2020-11-24 DIAGNOSIS — E11319 Type 2 diabetes mellitus with unspecified diabetic retinopathy without macular edema: Secondary | ICD-10-CM

## 2020-11-24 DIAGNOSIS — Z0000 Encounter for general adult medical examination without abnormal findings: Secondary | ICD-10-CM

## 2020-11-24 DIAGNOSIS — E1159 Type 2 diabetes mellitus with other circulatory complications: Secondary | ICD-10-CM

## 2020-11-24 DIAGNOSIS — E785 Hyperlipidemia, unspecified: Secondary | ICD-10-CM

## 2020-11-24 DIAGNOSIS — E1121 Type 2 diabetes mellitus with diabetic nephropathy: Secondary | ICD-10-CM | POA: Diagnosis not present

## 2020-11-24 MED ORDER — TIZANIDINE HCL 4 MG PO TABS: ORAL_TABLET | ORAL | 0 refills | Status: DC

## 2020-11-24 MED ORDER — AMLODIPINE BESYLATE 5 MG PO TABS: 5.0000 mg | ORAL_TABLET | Freq: Every day | ORAL | 11 refills | Status: DC

## 2020-11-24 NOTE — Patient Instructions (Addendum)
Can use tizanidine at night for back pain.  Start home stretching exercises as needed.  Can use voltaren gel or cream on hand arthritis as needed.  Quit smoking!  Stop HCTZ and change to amlodipine 5 mg daily. Call with BP measurement after 1 week. Goal < 140/90  Work on lower carbohydrates in diet.  Start walking 3-5 day a week. Call to set up mammogram and bone density.

## 2020-11-24 NOTE — Assessment & Plan Note (Signed)
Followed by pathobiology.

## 2020-11-24 NOTE — Assessment & Plan Note (Signed)
Working on cessation, counseled... not interested in med to treat.

## 2020-11-24 NOTE — Assessment & Plan Note (Signed)
Stable control on no medication. 

## 2020-11-24 NOTE — Progress Notes (Signed)
Patient ID: April Ross, female    DOB: 1950/10/11, 69 y.o.   MRN: [id number]  This visit was conducted in person.  BP (!) 200/110   Pulse 84   Temp (!) 97.4 F (36.3 C) (Temporal)   Ht 5\' 2"  (1.575 m)   Wt 151 lb 12 oz (68.8 kg)   SpO2 99%   BMI 27.76 kg/m    CC:  Chief Complaint  Patient presents with  . Medicare Wellness  . Back Pain    Mostly left side x "months"     Subjective:   HPI: April Ross is a 70 y.o. female presenting on 11/24/2020 for Medicare Wellness and Back Pain (Mostly left side x "months" )   I have personally reviewed the Medicare Annual Wellness questionnaire and have noted 1. The patient's medical and social history 2. Their use of alcohol, tobacco or illicit drugs 3. Their current medications and supplements 4. The patient's functional ability including ADL's, fall risks, home safety risks and hearing or visual             impairment. 5. Diet and physical activities 6. Evidence for depression or mood disorders 7.         Updated provider list Cognitive evaluation was performed and recorded on pt medicare questionnaire form. The patients weight, height, BMI and visual acuity have been recorded in the chart  I have made referrals, counseling and provided education to the patient based review of the above and I have provided the pt with a written personalized care plan for preventive services.   Documentation of this information was scanned into the electronic record under the media tab.  Flowsheet Row Clinical Support from 08/28/2019 in [facility] at [place]  PHQ-2 Total Score 0     Fall Risk  11/24/2020 08/28/2019 08/20/2018 05/09/2017 05/08/2016  Falls in the past year? 1 1 1  Yes Yes  Comment - - 2 falls due to tripping over small dog; denies injury - -  Number falls in past yr: 1 1 1 1 2  or more  Injury with Fall? 1 0 0 No No  Risk for fall due to : - History of fall(s) - Impaired vision -  Follow up - Falls evaluation  completed;Falls prevention discussed - Education provided;Falls prevention discussed -     Advance directives and end of life planning reviewed in detail with patient and documented in EMR. Patient given handout on advance care directives if needed. HCPOA and living will updated if needed.   Hearing Screening   125Hz  250Hz  500Hz  1000Hz  2000Hz  3000Hz  4000Hz  6000Hz  8000Hz   Right ear:  20 20 20 20  20     Left ear:  20 20 20 20  20       Visual Acuity Screening   Right eye Left eye Both eyes  Without correction:     With correction: 20/30 20/30 20/20     Chronic thoracic back pain, worse.Marland Kitchen keeping her up at night. Tizanadine helps a lot.  No new change related to falls in last year ( accidental or once unsteady due to trazodone)  No new new numbness or weakness.  occ using aleve during the day. 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   Hypertension:    She reports sometimes BP good at home.. sometime elevated. ON losartan 100    went to ER on 12/22//21 BP elevated.Marland Kitchen HCTZ  12.5 mg added  Recently home BPs 133/72-151/91 Using medication without problems or lightheadedness:  none Chest pain with exertion: none Edema:none Short of breath:none Average home BPs: Other issues:  Elevated Cholesterol:  Lab Results  Component Value Date   CHOL 197 11/18/2020   HDL 28.80 (L) 11/18/2020   LDLCALC 61 10/28/2017   LDLDIRECT 92.0 11/18/2020   TRIG (H) 11/18/2020    638.0 Triglyceride is over 400; calculations on Lipids are invalid.   CHOLHDL 7 11/18/2020  Using medications without problems: Muscle aches:  Diet compliance: poor Exercise:none Other complaints:  Hypothyroid  Stable control on levo  Wt Readings from Last 3 Encounters:  11/24/20 151 lb 12 oz (68.8 kg)  10/16/20 155 lb (70.3 kg)  10/03/20 153 lb 8 oz (69.6 kg)    Diabetes:   Much worse in last 6  months... had more Lattes each week. Mores potatos, beans Lab Results  Component Value Date   HGBA1C 13.6 Repeated and verified X2. (H) 11/18/2020  Using medications without difficulties:  Compliant with metformin. Hypoglycemic episodes: Hyperglycemic episodes: Feet problems: no  Blood Sugars averaging:  Had not been checking eye exam within last year: yes       Relevant past medical, surgical, family and social history reviewed and updated as indicated. Interim medical history since our last visit reviewed. Allergies and medications reviewed and updated. Outpatient Medications Prior to Visit  Medication Sig Dispense Refill  . alendronate (FOSAMAX) 70 MG tablet TAKE 1 TABLET BY MOUTH EVERY 7 DAYS WITH A FULL GLASS OF WATER ON AN EMPTY STOMACH 12 tablet 0  . aspirin EC 81 MG tablet Take 81 mg by mouth daily. Swallow whole.    . Calcium Carb-Cholecalciferol (CALCIUM-VITAMIN D) 500-200 MG-UNIT tablet Take 1 tablet by mouth daily.    . cetirizine (ZYRTEC) 10 MG tablet Take 10 mg by mouth at bedtime as needed for allergies.    Marland Kitchen CINNAMON PO Take 1 tablet by mouth daily.    . fluticasone (FLONASE) 50 MCG/ACT nasal spray Place 2 sprays into both nostrils daily as needed for allergies.    . hydrochlorothiazide (HYDRODIURIL) 25 MG tablet Take 0.5 tablets (12.5 mg total) by mouth daily. 30 tablet 0  . levothyroxine (SYNTHROID) 88 MCG tablet TAKE 1 TABLET BY MOUTH EVERY DAY 90 tablet 0  . losartan (COZAAR) 100 MG tablet TAKE 1 TABLET BY MOUTH EVERY DAY 90 tablet 1  . metFORMIN (GLUCOPHAGE) 500 MG tablet TAKE 1 TABLET BY MOUTH 2 TIMES DAILY WITH A MEAL. 180 tablet 0  . NEOMYCIN-POLYMYXIN-HYDROCORTISONE (CORTISPORIN) 1 % SOLN OTIC solution Apply 1-2 drops to toe BID after soaking 10 mL 1  . Omega-3 Fatty Acids (FISH OIL OMEGA-3 PO) Take 1 capsule by mouth daily.    Marland Kitchen omeprazole (PRILOSEC) 20 MG capsule TAKE 1 CAPSULE BY MOUTH EVERY DAY 90 capsule 0  . rosuvastatin (CRESTOR) 20 MG tablet TAKE 1  TABLET BY MOUTH EVERY DAY 90 tablet 0  . tiZANidine (ZANAFLEX) 4 MG tablet TAKE 1 TABLET BY MOUTH AT BEDTIME AS NEEDED FOR MUSCLE SPASMS. 90 tablet 0  . Turmeric (QC TUMERIC COMPLEX PO) Take 1 tablet by mouth daily.    . potassium chloride (KLOR-CON) 10 MEQ tablet Take 1 tablet (10 mEq total) by mouth daily. 30 tablet 0   No facility-administered medications prior to visit.     Per HPI unless specifically indicated in ROS section below Review of Systems  Constitutional: Negative for fatigue and fever.  HENT: Negative for congestion.  Eyes: Negative for pain.  Respiratory: Negative for cough and shortness of breath.   Cardiovascular: Negative for chest pain, palpitations and leg swelling.  Gastrointestinal: Negative for abdominal pain.  Genitourinary: Negative for dysuria and vaginal bleeding.  Musculoskeletal: Negative for back pain.  Neurological: Negative for syncope, light-headedness and headaches.  Psychiatric/Behavioral: Negative for dysphoric mood.   Objective:  BP (!) 200/110   Pulse 84   Temp (!) 97.4 F (36.3 C) (Temporal)   Ht 5\' 2"  (1.575 m)   Wt 151 lb 12 oz (68.8 kg)   SpO2 99%   BMI 27.76 kg/m   Wt Readings from Last 3 Encounters:  11/24/20 151 lb 12 oz (68.8 kg)  10/16/20 155 lb (70.3 kg)  10/03/20 153 lb 8 oz (69.6 kg)      Physical Exam Constitutional:      General: She is not in acute distress.Vital signs are normal.     Appearance: Normal appearance. She is well-developed and well-nourished. She is obese. She is not ill-appearing or toxic-appearing.  HENT:     Head: Normocephalic.     Right Ear: Hearing, tympanic membrane, ear canal and external ear normal.     Left Ear: Hearing, tympanic membrane, ear canal and external ear normal.     Nose: Nose normal.  Eyes:     General: Lids are normal. Lids are everted, no foreign bodies appreciated.     Extraocular Movements: EOM normal.     Conjunctiva/sclera: Conjunctivae normal.     Pupils: Pupils are  equal, round, and reactive to light.  Neck:     Thyroid: No thyroid mass or thyromegaly.     Vascular: No carotid bruit.     Trachea: Trachea normal.  Cardiovascular:     Rate and Rhythm: Normal rate and regular rhythm.     Pulses: Intact distal pulses.     Heart sounds: Normal heart sounds, S1 normal and S2 normal. No murmur heard. No gallop.   Pulmonary:     Effort: Pulmonary effort is normal. No respiratory distress.     Breath sounds: Normal breath sounds. No wheezing, rhonchi or rales.  Abdominal:     General: Bowel sounds are normal. There is no distension or abdominal bruit.     Palpations: Abdomen is soft. There is no fluid wave, hepatosplenomegaly or mass.     Tenderness: There is no abdominal tenderness. There is no CVA tenderness, guarding or rebound.     Hernia: No hernia is present.  Musculoskeletal:     Cervical back: Normal range of motion and neck supple.  Lymphadenopathy:     Cervical: No cervical adenopathy.     Upper Body:  No axillary adenopathy present. Skin:    General: Skin is warm, dry and intact.     Findings: No Didio.  Neurological:     Mental Status: She is alert.     Cranial Nerves: No cranial nerve deficit.     Sensory: No sensory deficit.     Deep Tendon Reflexes: Strength normal.  Psychiatric:        Mood and Affect: Mood is not anxious or depressed.        Speech: Speech normal.        Behavior: Behavior normal. Behavior is cooperative.        Cognition and Memory: Cognition and memory normal.        Judgment: Judgment normal.      Diabetic foot exam: Normal inspection No skin breakdown No calluses  Normal  DP pulses Normal sensation to light touch and monofilament Nails normal     Results for orders placed or performed in visit on 11/18/20  T3, free  Result Value Ref Range   T3, Free 3.1 2.3 - 4.2 pg/mL  T4, free  Result Value Ref Range   Free T4 1.22 0.60 - 1.60 ng/dL  TSH  Result Value Ref Range   TSH 2.89 0.35 - 4.50 uIU/mL   Comprehensive metabolic panel  Result Value Ref Range   Sodium 133 (L) 135 - 145 mEq/L   Potassium 4.6 3.5 - 5.1 mEq/L   Chloride 93 (L) 96 - 112 mEq/L   CO2 30 19 - 32 mEq/L   Glucose, Bld 454 (H) 70 - 99 mg/dL   BUN 29 (H) 6 - 23 mg/dL   Creatinine, Ser 1.77 (H) 0.40 - 1.20 mg/dL   Total Bilirubin 0.6 0.2 - 1.2 mg/dL   Alkaline Phosphatase 78 39 - 117 U/L   AST 44 (H) 0 - 37 U/L   ALT 29 0 - 35 U/L   Total Protein 7.2 6.0 - 8.3 g/dL   Albumin 4.5 3.5 - 5.2 g/dL   GFR 28.52 (L) >60.00 mL/min   Calcium 10.8 (H) 8.4 - 10.5 mg/dL  Lipid panel  Result Value Ref Range   Cholesterol 197 0 - 200 mg/dL   Triglycerides (H) 0.0 - 149.0 mg/dL    638.0 Triglyceride is over 400; calculations on Lipids are invalid.   HDL 28.80 (L) >39.00 mg/dL   Total CHOL/HDL Ratio 7   Hemoglobin A1c  Result Value Ref Range   Hgb A1c MFr Bld 13.6 Repeated and verified X2. (H) 4.6 - 6.5 %  LDL cholesterol, direct  Result Value Ref Range   Direct LDL 92.0 mg/dL    This visit occurred during the SARS-CoV-2 public health emergency.  Safety protocols were in place, including screening questions prior to the visit, additional usage of staff PPE, and extensive cleaning of exam room while observing appropriate contact time as indicated for disinfecting solutions.   COVID 19 screen:  No recent travel or known exposure to COVID19 The patient denies respiratory symptoms of COVID 19 at this time. The importance of social distancing was discussed today.   Assessment and Plan The patient's preventative maintenance and recommended screening tests for an annual wellness exam were reviewed in full today. Brought up to date unless services declined.  Counselled on the importance of diet, exercise, and its role in overall health and mortality. The patient's FH and SH was reviewed, including their home life, tobacco status, and drug and alcohol status.   Uptodate with PNA and flu vaccine, COVID x2  Current  smoker! S/P hysterectomy. Plans on setting up mammogram. Last mamog 10/2018 05/2019 DEXA..  Improved on fosamax. Will repeat 2022, plan to stop fosamax then   Problem List Items Addressed This Visit    Chronic bilateral thoracic back pain     Continue tizanidine and add home PT exercises.      Relevant Medications   tiZANidine (ZANAFLEX) 4 MG tablet   CKD (chronic kidney disease) stage 3, GFR 30-59 ml/min (HCC)    Need better DM and HTN control... worsened Estimated Creatinine Clearance: 26.5 mL/min (A) (by C-G formula based on SCr of 1.77 mg/dL (H)). ON ARB.      Diabetic retinopathy ([place])    Followed by pathobiology.      Hyperlipidemia associated with type 2 diabetes mellitus (HCC)    Stable, chronic.  Continue current medication.  Crestor 20 mg  Triglycerides are very high this time due to worsening of diet, increased carbs and poor control of DM. Recheck in 3 months.      Hypertension associated with diabetes ([place])     Very poor control... stop HCTZ as low sodium... change to amlodipine 5 mg daily. Continue losartan 100 mg daily.  She will send BPs from home in next few weeks.Marland Kitchen on new regimen.. likely will need increase amlodipine.      Relevant Medications   amLODipine (NORVASC) 5 MG tablet   Hypothyroidism    Stable, chronic.  Continue current medication.  Levothyroxine 88 mcg daily.       Moderate major depression, single episode (HCC)    Stable control on no medication.      Osteoporosis   Relevant Orders   DG Bone Density   Tobacco abuse    Working on cessation, counseled... not interested in med to treat.      Type 2 diabetes mellitus with nephropathy ([place])    Associated with nephropathy as well as new Dx retinopathy.  Very poor control, compliant with meds but not diet and lifestyle. Reviewed diet changes needed... follow CBGs at home. Re-eval with Z1C in 3 months... may need increase in metformin or additional med.       Other Visit Diagnoses     Medicare annual wellness visit, subsequent    -  Primary       Eliezer Lofts, MD

## 2020-11-24 NOTE — Assessment & Plan Note (Signed)
Stable, chronic.  Continue current medication.  Crestor 20 mg  Triglycerides are very high this time due to worsening of diet, increased carbs and poor control of DM. Recheck in 3 months.

## 2020-11-24 NOTE — Assessment & Plan Note (Signed)
Very poor control... stop HCTZ as low sodium... change to amlodipine 5 mg daily. Continue losartan 100 mg daily.  She will send BPs from home in next few weeks.Marland Kitchen on new regimen.. likely will need increase amlodipine.

## 2020-11-24 NOTE — Assessment & Plan Note (Signed)
Stable, chronic.  Continue current medication. ? ? ?Levothyroxine 88 mcg daily ?

## 2020-11-24 NOTE — Assessment & Plan Note (Signed)
Associated with nephropathy as well as new Dx retinopathy.  Very poor control, compliant with meds but not diet and lifestyle. Reviewed diet changes needed... follow CBGs at home. Re-eval with Z1C in 3 months... may need increase in metformin or additional med.

## 2020-11-24 NOTE — Assessment & Plan Note (Signed)
Continue tizanidine and add home PT exercises.

## 2020-11-24 NOTE — Assessment & Plan Note (Signed)
Need better DM and HTN control... worsened Estimated Creatinine Clearance: 26.5 mL/min (A) (by C-G formula based on SCr of 1.77 mg/dL (H)). ON ARB.

## 2020-11-29 ENCOUNTER — Other Ambulatory Visit: Payer: Self-pay | Admitting: Family Medicine

## 2020-12-01 ENCOUNTER — Encounter (HOSPITAL_COMMUNITY): Payer: Self-pay | Admitting: Emergency Medicine

## 2020-12-01 ENCOUNTER — Other Ambulatory Visit: Payer: Self-pay

## 2020-12-01 ENCOUNTER — Emergency Department (HOSPITAL_COMMUNITY)
Admission: EM | Admit: 2020-12-01 | Discharge: 2020-12-01 | Disposition: A | Discharge status: Home / Self Care | Payer: PPO | Attending: Emergency Medicine | Admitting: Emergency Medicine

## 2020-12-01 DIAGNOSIS — I129 Hypertensive chronic kidney disease with stage 1 through stage 4 chronic kidney disease, or unspecified chronic kidney disease: Secondary | ICD-10-CM | POA: Diagnosis not present

## 2020-12-01 DIAGNOSIS — Z7982 Long term (current) use of aspirin: Secondary | ICD-10-CM | POA: Insufficient documentation

## 2020-12-01 DIAGNOSIS — Z79899 Other long term (current) drug therapy: Secondary | ICD-10-CM | POA: Diagnosis not present

## 2020-12-01 DIAGNOSIS — E785 Hyperlipidemia, unspecified: Secondary | ICD-10-CM | POA: Diagnosis not present

## 2020-12-01 DIAGNOSIS — F17210 Nicotine dependence, cigarettes, uncomplicated: Secondary | ICD-10-CM | POA: Diagnosis not present

## 2020-12-01 DIAGNOSIS — E1165 Type 2 diabetes mellitus with hyperglycemia: Secondary | ICD-10-CM | POA: Diagnosis not present

## 2020-12-01 DIAGNOSIS — E039 Hypothyroidism, unspecified: Secondary | ICD-10-CM | POA: Diagnosis not present

## 2020-12-01 DIAGNOSIS — R739 Hyperglycemia, unspecified: Secondary | ICD-10-CM | POA: Diagnosis present

## 2020-12-01 DIAGNOSIS — N183 Chronic kidney disease, stage 3 unspecified: Secondary | ICD-10-CM | POA: Insufficient documentation

## 2020-12-01 DIAGNOSIS — E1122 Type 2 diabetes mellitus with diabetic chronic kidney disease: Secondary | ICD-10-CM | POA: Diagnosis not present

## 2020-12-01 DIAGNOSIS — Z7984 Long term (current) use of oral hypoglycemic drugs: Secondary | ICD-10-CM | POA: Insufficient documentation

## 2020-12-01 DIAGNOSIS — E1169 Type 2 diabetes mellitus with other specified complication: Secondary | ICD-10-CM | POA: Insufficient documentation

## 2020-12-01 DIAGNOSIS — E11319 Type 2 diabetes mellitus with unspecified diabetic retinopathy without macular edema: Secondary | ICD-10-CM | POA: Diagnosis not present

## 2020-12-01 LAB — URINALYSIS, ROUTINE W REFLEX MICROSCOPIC
Bilirubin Urine: NEGATIVE
Glucose, UA: >=500 mg/dL — AB
Hgb urine dipstick: NEGATIVE
Ketones, ur: NEGATIVE mg/dL
Leukocytes,Ua: NEGATIVE
Nitrite: NEGATIVE
Protein, ur: NEGATIVE mg/dL
Specific Gravity, Urine: 1.006 (ref 1.005–1.030)
pH: 6 (ref 5.0–8.0)

## 2020-12-01 LAB — CBC WITH DIFFERENTIAL/PLATELET
Abs Immature Granulocytes: 0.03 10*3/uL (ref 0.00–0.07)
Basophils Absolute: 0.1 10*3/uL (ref 0.0–0.1)
Basophils Relative: 1 %
Eosinophils Absolute: 1 10*3/uL — ABNORMAL HIGH (ref 0.0–0.5)
Eosinophils Relative: 12 %
HCT: 38.2 % (ref 36.0–46.0)
Hemoglobin: 12.8 g/dL (ref 12.0–15.0)
Immature Granulocytes: 0 %
Lymphocytes Relative: 27 %
Lymphs Abs: 2.3 10*3/uL (ref 0.7–4.0)
MCH: 28 pg (ref 26.0–34.0)
MCHC: 33.5 g/dL (ref 30.0–36.0)
MCV: 83.6 fL (ref 80.0–100.0)
Monocytes Absolute: 0.8 10*3/uL (ref 0.1–1.0)
Monocytes Relative: 9 %
Neutro Abs: 4.3 10*3/uL (ref 1.7–7.7)
Neutrophils Relative %: 51 %
Platelets: 240 10*3/uL (ref 150–400)
RBC: 4.57 MIL/uL (ref 3.87–5.11)
RDW: 14.3 % (ref 11.5–15.5)
WBC: 8.5 10*3/uL (ref 4.0–10.5)
nRBC: 0 % (ref 0.0–0.2)

## 2020-12-01 LAB — COMPREHENSIVE METABOLIC PANEL
ALT: 39 U/L (ref 0–44)
AST: 66 U/L — ABNORMAL HIGH (ref 15–41)
Albumin: 4.3 g/dL (ref 3.5–5.0)
Alkaline Phosphatase: 61 U/L (ref 38–126)
Anion gap: 11 (ref 5–15)
BUN: 26 mg/dL — ABNORMAL HIGH (ref 8–23)
CO2: 22 mmol/L (ref 22–32)
Calcium: 9.8 mg/dL (ref 8.9–10.3)
Chloride: 98 mmol/L (ref 98–111)
Creatinine, Ser: 1.41 mg/dL — ABNORMAL HIGH (ref 0.44–1.00)
GFR, Estimated: 40 mL/min — ABNORMAL LOW (ref >60)
Glucose, Bld: 367 mg/dL — ABNORMAL HIGH (ref 70–99)
Potassium: 4.3 mmol/L (ref 3.5–5.1)
Sodium: 131 mmol/L — ABNORMAL LOW (ref 135–145)
Total Bilirubin: 0.3 mg/dL (ref 0.3–1.2)
Total Protein: 7.2 g/dL (ref 6.5–8.1)

## 2020-12-01 LAB — BETA-HYDROXYBUTYRIC ACID: Beta-Hydroxybutyric Acid: 0.17 mmol/L (ref 0.05–0.27)

## 2020-12-01 LAB — CBG MONITORING, ED
Glucose-Capillary: 362 mg/dL — ABNORMAL HIGH (ref 70–99)
Glucose-Capillary: 249 mg/dL — ABNORMAL HIGH (ref 70–99)

## 2020-12-01 MED ORDER — GLYBURIDE 5 MG PO TABS: 5.0000 mg | ORAL_TABLET | Freq: Two times a day (BID) | ORAL | 0 refills | Status: DC

## 2020-12-01 MED ORDER — INSULIN ASPART 100 UNIT/ML IV SOLN
5.0000 [IU] | Freq: Once | INTRAVENOUS | Status: AC
  Administered 2020-12-01: 5 [IU] via INTRAVENOUS

## 2020-12-01 MED ORDER — SODIUM CHLORIDE 0.9 % IV BOLUS
1000.0000 mL | Freq: Once | INTRAVENOUS | Status: AC
  Administered 2020-12-01: 1000 mL via INTRAVENOUS

## 2020-12-01 NOTE — Discharge Instructions (Signed)
Your testing shows that your blood sugar has come down nicely. You should continue taking your Metformin You should continue on a strict diabetic diet I have added a medication called glyburide which you would take once in the morning and once in the evening.  I have given you a 30-day prescription You will need to follow-up with your family doctor within the next 3 weeks to be rechecked, have lab work repeated Please measure your blood sugar once a day sometime in the middle of the day after lunch, record this number in chart with your doctor If you are having severe or worsening symptoms or if your blood sugars are continually running higher and higher above 500 you should return to the emergency department for repeat evaluation

## 2020-12-01 NOTE — ED Notes (Signed)
Pt provided with ice chips, ice water and graham crackers. No other requests at this time. Will continue to monitor.

## 2020-12-01 NOTE — ED Provider Notes (Signed)
[facility] EMERGENCY DEPARTMENT Provider Note   CSN: [id number] Arrival date & time: 12/01/20  1802     History Chief Complaint  Patient presents with  . Hyperglycemia    April Ross is a 70 y.o. female.  HPI   This patient is a 70 year old female, she has stage III chronic kidney disease, she has a known history of diabetes for approximately 20 years and has been on Metformin only.  The patient reports that she had last seen her family doctor recently and review of the medical record shows that it was about 1 week ago.  During that time it was discussed that she had a hemoglobin A1c of 13.6, her most recent one was 6.65 months prior.  A comprehensive metabolic panel at that time showed a glucose of 450, no signs of acidosis, creatinine of 1.77  It was noted that the patient had not been very compliant with her diet, she has been compliant with medications.  They recommended that she immediately change her diet which she states that she has.  She had been eating anything that she wanted prior to that.  She has not been eating any refined sugars or significant carbs since that time.  She continues to have hyperglycemia measuring over 500 and sometimes 600 over the last couple of days.  Her only symptoms are generalized fatigue and increasing urinary frequency and thirst.  She has not lost weight, she has not had syncope, chest pain, palpitations, shortness of breath, swelling of the legs, dysuria, rectal bleeding, diarrhea or abdominal pain or chest pain.  At this time the patient feels unremarkably well    Past Medical History:  Diagnosis Date  . Allergy   . Arthritis   . CRD (chronic renal disease)    stage 3  . Depression   . Diabetes mellitus without complication ([place])   . GERD (gastroesophageal reflux disease)   . Hyperlipidemia   . Hypertension   . LBBB (left bundle branch block)    a. diagnosed in 10/2017 - echo showed a preserved EF of 55-60% and NST was low-risk  showing no evidence of ischemia.   . Osteoporosis   . polyps in colon   . Thyroid disease   . Urinary tract infection     Patient Active Problem List   Diagnosis Date Noted  . Diabetic retinopathy ([place]) 11/24/2020  . ETD (Eustachian tube dysfunction), right 08/04/2019  . Chronic bilateral thoracic back pain 11/21/2018  . Type 2 diabetes mellitus with nephropathy ([place]) 10/27/2017  . Tobacco abuse 10/27/2017  . Nonspecific chest pain   . Dizziness 05/16/2017  . Osteoporosis 05/24/2016  . CKD (chronic kidney disease) stage 3, GFR 30-59 ml/min (HCC) 05/08/2016  . Hyperlipidemia associated with type 2 diabetes mellitus ([place]) 01/12/2016  . Hypothyroidism 01/12/2016  . Hypertension associated with diabetes ([place]) 01/12/2016  . Allergic rhinitis 01/12/2016  . Moderate major depression, single episode ([place]) 01/12/2016  . Chronic insomnia 01/12/2016  . GERD (gastroesophageal reflux disease) 01/12/2016    Past Surgical History:  Procedure Laterality Date  . ABDOMINAL HYSTERECTOMY    . APPENDECTOMY       OB History   No obstetric history on file.     Family History  Problem Relation Age of Onset  . Alzheimer's disease Mother   . Hyperlipidemia Mother   . Hypertension Mother   . Asthma Maternal Grandmother   . Diabetes Maternal Grandfather   . Alcohol abuse Maternal Grandfather   . Hyperlipidemia Maternal Grandfather   .  Stroke Maternal Grandfather   . Hypertension Maternal Grandfather   . Diabetes Paternal Grandmother   . Alcohol abuse Maternal Uncle   . Cholecystitis Maternal Uncle   . Arthritis Maternal Uncle     Social History   Tobacco Use  . Smoking status: Current Every Day Smoker    Packs/day: 0.25    Types: Cigarettes  . Smokeless tobacco: Never Used  Vaping Use  . Vaping Use: Never used  Substance Use Topics  . Alcohol use: Yes    Alcohol/week: 0.0 standard drinks    Comment: occ glass of wine or mix drink  . Drug use: No    Home Medications Prior  to Admission medications   Medication Sig Start Date End Date Taking? Authorizing Provider  glyBURIDE (DIABETA) 5 MG tablet Take 1 tablet (5 mg total) by mouth 2 (two) times daily with a meal. 12/01/20  Yes Noemi Chapel, MD  alendronate (FOSAMAX) 70 MG tablet TAKE 1 TABLET BY MOUTH EVERY 7 DAYS WITH A FULL GLASS OF WATER ON AN EMPTY STOMACH 11/11/20   Bedsole, Amy E, MD  amLODipine (NORVASC) 5 MG tablet Take 1 tablet (5 mg total) by mouth daily. 11/24/20   Bedsole, Amy E, MD  aspirin EC 81 MG tablet Take 81 mg by mouth daily. Swallow whole.    [provider]  Calcium Carb-Cholecalciferol (CALCIUM-VITAMIN D) 500-200 MG-UNIT tablet Take 1 tablet by mouth daily.    [provider]  cetirizine (ZYRTEC) 10 MG tablet Take 10 mg by mouth at bedtime as needed for allergies.    [provider]  CINNAMON PO Take 1 tablet by mouth daily.    [provider]  fluticasone (FLONASE) 50 MCG/ACT nasal spray Place 2 sprays into both nostrils daily as needed for allergies.    [provider]  levothyroxine (SYNTHROID) 88 MCG tablet TAKE 1 TABLET BY MOUTH EVERY DAY 09/05/20   Bedsole, Amy E, MD  losartan (COZAAR) 100 MG tablet TAKE 1 TABLET BY MOUTH EVERY DAY 06/30/20   Bedsole, Amy E, MD  metFORMIN (GLUCOPHAGE) 500 MG tablet TAKE 1 TABLET BY MOUTH 2 TIMES DAILY WITH A MEAL. 11/17/20   Bedsole, Amy E, MD  NEOMYCIN-POLYMYXIN-HYDROCORTISONE (CORTISPORIN) 1 % SOLN OTIC solution Apply 1-2 drops to toe BID after soaking 10/18/20   Hyatt, Max T, DPM  Omega-3 Fatty Acids (FISH OIL OMEGA-3 PO) Take 1 capsule by mouth daily.    [provider]  omeprazole (PRILOSEC) 20 MG capsule TAKE 1 CAPSULE BY MOUTH EVERY DAY 11/29/20   Bedsole, Amy E, MD  rosuvastatin (CRESTOR) 20 MG tablet TAKE 1 TABLET BY MOUTH EVERY DAY 10/12/20   Bedsole, Amy E, MD  tiZANidine (ZANAFLEX) 4 MG tablet TAKE 1 TABLET BY MOUTH AT BEDTIME AS NEEDED FOR MUSCLE SPASMS. 11/24/20   Bedsole, Amy E, MD  Turmeric (QC  TUMERIC COMPLEX PO) Take 1 tablet by mouth daily.    [provider]    Allergies    Acetaminophen, Asa [aspirin], Ibuprofen, and Other  Review of Systems   Review of Systems  All other systems reviewed and are negative.   Physical Exam Updated Vital Signs BP (!) 175/70 (BP Location: Left Arm)   Pulse 64   Temp 98 F (36.7 C) (Oral)   Resp 16   Ht 1.575 m (5\' 2" )   Wt 68 kg   SpO2 100%   BMI 27.44 kg/m   Physical Exam Vitals and nursing note reviewed.  Constitutional:  General: She is not in acute distress.    Appearance: She is well-developed and well-nourished.  HENT:     Head: Normocephalic and atraumatic.     Mouth/Throat:     Mouth: Oropharynx is clear and moist. Mucous membranes are dry.     Pharynx: No oropharyngeal exudate.  Eyes:     General: No scleral icterus.       Right eye: No discharge.        Left eye: No discharge.     Extraocular Movements: EOM normal.     Conjunctiva/sclera: Conjunctivae normal.     Pupils: Pupils are equal, round, and reactive to light.  Neck:     Thyroid: No thyromegaly.     Vascular: No JVD.  Cardiovascular:     Rate and Rhythm: Normal rate and regular rhythm.     Pulses: Intact distal pulses.     Heart sounds: Normal heart sounds. No murmur heard. No friction rub. No gallop.   Pulmonary:     Effort: Pulmonary effort is normal. No respiratory distress.     Breath sounds: Normal breath sounds. No wheezing or rales.  Abdominal:     General: Bowel sounds are normal. There is no distension.     Palpations: Abdomen is soft. There is no mass.     Tenderness: There is no abdominal tenderness.  Musculoskeletal:        General: No tenderness or edema. Normal range of motion.     Cervical back: Normal range of motion and neck supple.  Lymphadenopathy:     Cervical: No cervical adenopathy.  Skin:    General: Skin is warm and dry.     Findings: No erythema or [place].  Neurological:     Mental Status: She is alert.      Coordination: Coordination normal.  Psychiatric:        Mood and Affect: Mood and affect normal.        Behavior: Behavior normal.     ED Results / Procedures / Treatments   Labs (all labs ordered are listed, but only abnormal results are displayed) Labs Reviewed  CBC WITH DIFFERENTIAL/PLATELET - Abnormal; Notable for the following components:      Result Value   Eosinophils Absolute 1.0 (*)    All other components within normal limits  URINALYSIS, ROUTINE W REFLEX MICROSCOPIC - Abnormal; Notable for the following components:   Color, Urine STRAW (*)    Glucose, UA >=500 (*)    Bacteria, UA RARE (*)    All other components within normal limits  COMPREHENSIVE METABOLIC PANEL - Abnormal; Notable for the following components:   Sodium 131 (*)    Glucose, Bld 367 (*)    BUN 26 (*)    Creatinine, Ser 1.41 (*)    AST 66 (*)    GFR, Estimated 40 (*)    All other components within normal limits  CBG MONITORING, ED - Abnormal; Notable for the following components:   Glucose-Capillary 362 (*)    All other components within normal limits  CBG MONITORING, ED - Abnormal; Notable for the following components:   Glucose-Capillary 249 (*)    All other components within normal limits  BETA-HYDROXYBUTYRIC ACID    EKG None  Radiology No results found.  Procedures Procedures   Medications Ordered in ED Medications  sodium chloride 0.9 % bolus 1,000 mL (0 mLs Intravenous Stopping Infusion hung by another clincian 12/01/20 1942)  insulin aspart (novoLOG) injection 5 Units (5 Units Intravenous Given  12/01/20 1847)    ED Course  I have reviewed the triage vital signs and the nursing notes.  Pertinent labs & imaging results that were available during my care of the patient were reviewed by me and considered in my medical decision making (see chart for details).    MDM Rules/Calculators/A&P                          The patient is been seen and examined, she does not appear to be  in any distress, her blood sugar has been elevated and here it is just under 400.  We will do labs to rule out other causes of hyperglycemia though I suspect this is a gradual progression of her illness.  She will get IV fluids and some insulin, she may need to have other medications added at this time albeit may be temporarily if she changes her lifestyle successfully.  The patient's blood sugar has reduced significantly, she has normal vital signs except for the hypertension, this has remained moderately and stably elevated.  I have rechecked her blood sugar and it has come down after IV fluids and insulin.  I will start glyburide twice a day in addition to her home Metformin, she will need to follow-up to have her levels rechecked and any augmentation to the medications given.  The patient is agreeable  Final Clinical Impression(s) / ED Diagnoses Final diagnoses:  Uncontrolled type 2 diabetes mellitus with hyperglycemia ([place])    Rx / DC Orders ED Discharge Orders         Ordered    glyBURIDE (DIABETA) 5 MG tablet  2 times daily with meals        12/01/20 2052           Noemi Chapel, MD 12/01/20 2055

## 2020-12-01 NOTE — ED Triage Notes (Signed)
Pt states she has had Hyperglycemia for the last several days ranging from 300-over 500.  Pt has a CBG of 362 now.

## 2020-12-08 ENCOUNTER — Other Ambulatory Visit: Payer: Self-pay | Admitting: Family Medicine

## 2020-12-21 DIAGNOSIS — E113291 Type 2 diabetes mellitus with mild nonproliferative diabetic retinopathy without macular edema, right eye: Secondary | ICD-10-CM | POA: Diagnosis not present

## 2020-12-21 DIAGNOSIS — G453 Amaurosis fugax: Secondary | ICD-10-CM | POA: Diagnosis not present

## 2020-12-21 DIAGNOSIS — H35033 Hypertensive retinopathy, bilateral: Secondary | ICD-10-CM | POA: Diagnosis not present

## 2020-12-21 DIAGNOSIS — H2513 Age-related nuclear cataract, bilateral: Secondary | ICD-10-CM | POA: Diagnosis not present

## 2020-12-21 LAB — HM DIABETES EYE EXAM

## 2020-12-22 ENCOUNTER — Other Ambulatory Visit: Payer: Self-pay | Admitting: Family Medicine

## 2020-12-22 NOTE — Progress Notes (Signed)
Spoke with April Ross.  She never received any Upper Back exercises in the mail and she would still like to have them.  Exercises mailed to patient.

## 2020-12-26 ENCOUNTER — Other Ambulatory Visit: Payer: Self-pay | Admitting: Emergency Medicine

## 2020-12-26 MED ORDER — GLYBURIDE 5 MG PO TABS: 5.0000 mg | ORAL_TABLET | Freq: Two times a day (BID) | ORAL | 5 refills | Status: DC

## 2021-01-04 ENCOUNTER — Other Ambulatory Visit: Payer: Self-pay | Admitting: Family Medicine

## 2021-01-28 ENCOUNTER — Other Ambulatory Visit: Payer: Self-pay | Admitting: Family Medicine

## 2021-02-11 ENCOUNTER — Other Ambulatory Visit: Payer: Self-pay | Admitting: Family Medicine

## 2021-02-14 ENCOUNTER — Telehealth: Payer: Self-pay | Admitting: Family Medicine

## 2021-02-14 ENCOUNTER — Other Ambulatory Visit: Payer: Self-pay | Admitting: Family Medicine

## 2021-02-14 DIAGNOSIS — E1121 Type 2 diabetes mellitus with diabetic nephropathy: Secondary | ICD-10-CM

## 2021-02-14 NOTE — Telephone Encounter (Signed)
-----   Message from Cloyd Stagers, RT sent at 01/30/2021  1:59 PM EDT ----- Regarding: Lab Orders for Friday 5.13.2022 Please place lab orders for Friday 5.13.2022, office visit for 3 month f/u on Friday 5.20.2022 Thank you, Dyke Maes RT(R)

## 2021-02-14 NOTE — Telephone Encounter (Signed)
Last office visit 11/24/2020 for [place].  Last refilled 11/24/2020 fr #90 with no refills.  Next Appt: 02/24/2021 for DM.

## 2021-02-17 ENCOUNTER — Other Ambulatory Visit (INDEPENDENT_AMBULATORY_CARE_PROVIDER_SITE_OTHER): Payer: PPO

## 2021-02-17 ENCOUNTER — Other Ambulatory Visit: Payer: Self-pay

## 2021-02-17 DIAGNOSIS — E1121 Type 2 diabetes mellitus with diabetic nephropathy: Secondary | ICD-10-CM

## 2021-02-17 LAB — LIPID PANEL
Cholesterol: 189 mg/dL (ref 0–200)
HDL: 31.4 mg/dL — ABNORMAL LOW (ref >39.00)
NonHDL: 157.72
Total CHOL/HDL Ratio: 6
Triglycerides: 340 mg/dL — ABNORMAL HIGH (ref 0.0–149.0)
VLDL: 68 mg/dL — ABNORMAL HIGH (ref 0.0–40.0)

## 2021-02-17 LAB — COMPREHENSIVE METABOLIC PANEL
ALT: 32 U/L (ref 0–35)
AST: 34 U/L (ref 0–37)
Albumin: 4.9 g/dL (ref 3.5–5.2)
Alkaline Phosphatase: 64 U/L (ref 39–117)
BUN: 31 mg/dL — ABNORMAL HIGH (ref 6–23)
CO2: 27 mEq/L (ref 19–32)
Calcium: 10.6 mg/dL — ABNORMAL HIGH (ref 8.4–10.5)
Chloride: 102 mEq/L (ref 96–112)
Creatinine, Ser: 1.63 mg/dL — ABNORMAL HIGH (ref 0.40–1.20)
GFR: 31.43 mL/min — ABNORMAL LOW (ref >60.00)
Glucose, Bld: 205 mg/dL — ABNORMAL HIGH (ref 70–99)
Potassium: 4.5 mEq/L (ref 3.5–5.1)
Sodium: 139 mEq/L (ref 135–145)
Total Bilirubin: 0.4 mg/dL (ref 0.2–1.2)
Total Protein: 7.6 g/dL (ref 6.0–8.3)

## 2021-02-17 LAB — LDL CHOLESTEROL, DIRECT: Direct LDL: 116 mg/dL

## 2021-02-17 LAB — HEMOGLOBIN A1C: Hgb A1c MFr Bld: 8.3 % — ABNORMAL HIGH (ref 4.6–6.5)

## 2021-02-17 NOTE — Progress Notes (Signed)
No critical labs need to be addressed urgently. We will discuss labs in detail at upcoming office visit.   

## 2021-02-24 ENCOUNTER — Other Ambulatory Visit: Payer: Self-pay

## 2021-02-24 ENCOUNTER — Ambulatory Visit (INDEPENDENT_AMBULATORY_CARE_PROVIDER_SITE_OTHER): Payer: PPO | Admitting: Family Medicine

## 2021-02-24 VITALS — BP 142/68 | HR 64 | Temp 97.7°F | Ht 62.0 in | Wt 156.0 lb

## 2021-02-24 DIAGNOSIS — E1121 Type 2 diabetes mellitus with diabetic nephropathy: Secondary | ICD-10-CM | POA: Diagnosis not present

## 2021-02-24 DIAGNOSIS — E1159 Type 2 diabetes mellitus with other circulatory complications: Secondary | ICD-10-CM | POA: Diagnosis not present

## 2021-02-24 DIAGNOSIS — E785 Hyperlipidemia, unspecified: Secondary | ICD-10-CM

## 2021-02-24 DIAGNOSIS — I152 Hypertension secondary to endocrine disorders: Secondary | ICD-10-CM

## 2021-02-24 DIAGNOSIS — E1169 Type 2 diabetes mellitus with other specified complication: Secondary | ICD-10-CM

## 2021-02-24 DIAGNOSIS — N1831 Chronic kidney disease, stage 3a: Secondary | ICD-10-CM | POA: Diagnosis not present

## 2021-02-24 DIAGNOSIS — E11319 Type 2 diabetes mellitus with unspecified diabetic retinopathy without macular edema: Secondary | ICD-10-CM

## 2021-02-24 MED ORDER — TRULICITY 0.75 MG/0.5ML ~~LOC~~ SOAJ: 0.7500 mg | SUBCUTANEOUS | 11 refills | Status: DC

## 2021-02-24 NOTE — Assessment & Plan Note (Signed)
Stable control.. needs better DMa nd HTN control.

## 2021-02-24 NOTE — Assessment & Plan Note (Signed)
Improving control with addition of amlodipine at last OV  To losartan 100 mg daily.  Not yet at goal. May need further increase in medication if does not improve with weight loss and Trulicity start.

## 2021-02-24 NOTE — Assessment & Plan Note (Signed)
Needs better DM control.

## 2021-02-24 NOTE — Progress Notes (Signed)
Patient ID: April Ross, female    DOB: 02-03-1951, 70 y.o.   MRN: [id number]  This visit was conducted in person.  BP (!) 142/68   Pulse 64   Temp 97.7 F (36.5 C) (Temporal)   Ht 5\' 2"  (1.575 m)   Wt 156 lb (70.8 kg)   SpO2 97%   BMI 28.53 kg/m    CC:  Chief Complaint  Patient presents with  . Follow-up    3 mos DM     Subjective:   HPI: April Ross is a 70 y.o. female presenting on 02/24/2021 for Follow-up (3 mos DM )    Diabetes:    Improved control ( down from 13.6  3 month ago) On metfomrin 500 mg BID  Glyburide 5 mg BID started at ER visit when CBGs 500. She is having trouble losing weight, she is avoiding sweets, avoiding carbs. Lab Results  Component Value Date   HGBA1C 8.3 (H) 02/17/2021  Using medications without difficulties: Hypoglycemic episodes: none Hyperglycemic episodes: once Feet problems: no ulcers Blood Sugars averaging: FBS 140-160 eye exam within last year: Wt Readings from Last 3 Encounters:  02/24/21 156 lb (70.8 kg)  12/01/20 150 lb (68 kg)  11/24/20 151 lb 12 oz (68.8 kg)     Elevated Cholesterol:  LDL  above goal on crestor 20 mg daily Lab Results  Component Value Date   CHOL 189 02/17/2021   HDL 31.40 (L) 02/17/2021   LDLCALC 61 10/28/2017   LDLDIRECT 116.0 02/17/2021   TRIG 340.0 (H) 02/17/2021   CHOLHDL 6 02/17/2021  Using medications without problems: Muscle aches:  Diet compliance: Exercise: minimal given no time Other complaints:  Hypertension:    Borderline control in office today on losartan 100 mg daily, amlodipine 5 mg daily... has not taken meds this AM. BP Readings from Last 3 Encounters:  02/24/21 (!) 142/68  12/01/20 (!) 159/72  11/24/20 (!) 151/91  Using medication without problems or lightheadedness:  none Chest pain with exertion: none Edema: none Short of breath: none Average home BPs: 140s/80s Other issues:  CKD Estimated Creatinine Clearance: 28.8 mL/min (A) (by C-G formula based on SCr  of 1.63 mg/dL (H)).   Relevant past medical, surgical, family and social history reviewed and updated as indicated. Interim medical history since our last visit reviewed. Allergies and medications reviewed and updated. Outpatient Medications Prior to Visit  Medication Sig Dispense Refill  . alendronate (FOSAMAX) 70 MG tablet TAKE 1 TABLET BY MOUTH EVERY 7 DAYS WITH A FULL GLASS OF WATER ON AN EMPTY STOMACH 12 tablet 0  . amLODipine (NORVASC) 5 MG tablet Take 1 tablet (5 mg total) by mouth daily. 90 tablet 11  . aspirin EC 81 MG tablet Take 81 mg by mouth daily. Swallow whole.    . Calcium Carb-Cholecalciferol (CALCIUM-VITAMIN D) 500-200 MG-UNIT tablet Take 1 tablet by mouth daily.    . cetirizine (ZYRTEC) 10 MG tablet Take 10 mg by mouth at bedtime as needed for allergies.    Marland Kitchen CINNAMON PO Take 1 tablet by mouth daily.    . fluticasone (FLONASE) 50 MCG/ACT nasal spray Place 2 sprays into both nostrils daily as needed for allergies.    Marland Kitchen glyBURIDE (DIABETA) 5 MG tablet Take 1 tablet (5 mg total) by mouth 2 (two) times daily with a meal. 60 tablet 5  . levothyroxine (SYNTHROID) 88 MCG tablet TAKE 1 TABLET BY MOUTH EVERY DAY 90 tablet 3  . losartan (COZAAR) 100 MG tablet  TAKE 1 TABLET BY MOUTH EVERY DAY 90 tablet 1  . metFORMIN (GLUCOPHAGE) 500 MG tablet TAKE 1 TABLET BY MOUTH 2 TIMES DAILY WITH A MEAL. 180 tablet 0  . Omega-3 Fatty Acids (FISH OIL OMEGA-3 PO) Take 1 capsule by mouth daily.    Marland Kitchen omeprazole (PRILOSEC) 20 MG capsule TAKE 1 CAPSULE BY MOUTH EVERY DAY 90 capsule 3  . rosuvastatin (CRESTOR) 20 MG tablet TAKE 1 TABLET BY MOUTH EVERY DAY 90 tablet 3  . tiZANidine (ZANAFLEX) 4 MG tablet TAKE 1 TABLET BY MOUTH AT BEDTIME AS NEEDED FOR MUSCLE SPASMS. 90 tablet 0  . Turmeric (QC TUMERIC COMPLEX PO) Take 1 tablet by mouth daily.    . NEOMYCIN-POLYMYXIN-HYDROCORTISONE (CORTISPORIN) 1 % SOLN OTIC solution Apply 1-2 drops to toe BID after soaking 10 mL 1   No facility-administered medications  prior to visit.     Per HPI unless specifically indicated in ROS section below Review of Systems  Constitutional: Negative for fatigue and fever.  HENT: Negative for ear pain.   Eyes: Negative for pain.  Respiratory: Negative for chest tightness and shortness of breath.   Cardiovascular: Negative for chest pain, palpitations and leg swelling.  Gastrointestinal: Negative for abdominal pain.  Genitourinary: Negative for dysuria.   Objective:  BP (!) 142/68   Pulse 64   Temp 97.7 F (36.5 C) (Temporal)   Ht 5\' 2"  (1.575 m)   Wt 156 lb (70.8 kg)   SpO2 97%   BMI 28.53 kg/m   Wt Readings from Last 3 Encounters:  02/24/21 156 lb (70.8 kg)  12/01/20 150 lb (68 kg)  11/24/20 151 lb 12 oz (68.8 kg)      Physical Exam Constitutional:      General: She is not in acute distress.    Appearance: Normal appearance. She is well-developed. She is not ill-appearing or toxic-appearing.  HENT:     Head: Normocephalic.     Right Ear: Hearing, tympanic membrane, ear canal and external ear normal. Tympanic membrane is not erythematous, retracted or bulging.     Left Ear: Hearing, tympanic membrane, ear canal and external ear normal. Tympanic membrane is not erythematous, retracted or bulging.     Nose: No mucosal edema or rhinorrhea.     Right Sinus: No maxillary sinus tenderness or frontal sinus tenderness.     Left Sinus: No maxillary sinus tenderness or frontal sinus tenderness.     Mouth/Throat:     Pharynx: Uvula midline.  Eyes:     General: Lids are normal. Lids are everted, no foreign bodies appreciated.     Conjunctiva/sclera: Conjunctivae normal.     Pupils: Pupils are equal, round, and reactive to light.  Neck:     Thyroid: No thyroid mass or thyromegaly.     Vascular: No carotid bruit.     Trachea: Trachea normal.  Cardiovascular:     Rate and Rhythm: Normal rate and regular rhythm.     Pulses: Normal pulses.     Heart sounds: Normal heart sounds, S1 normal and S2 normal. No  murmur heard. No friction rub. No gallop.   Pulmonary:     Effort: Pulmonary effort is normal. No tachypnea or respiratory distress.     Breath sounds: Normal breath sounds. No decreased breath sounds, wheezing, rhonchi or rales.  Abdominal:     General: Bowel sounds are normal.     Palpations: Abdomen is soft.     Tenderness: There is no abdominal tenderness.  Musculoskeletal:  Cervical back: Normal range of motion and neck supple.  Skin:    General: Skin is warm and dry.     Findings: No [place].  Neurological:     Mental Status: She is alert.  Psychiatric:        Mood and Affect: Mood is not anxious or depressed.        Speech: Speech normal.        Behavior: Behavior normal. Behavior is cooperative.        Thought Content: Thought content normal.        Judgment: Judgment normal.   Body mass index is 28.53 kg/m.     Results for orders placed or performed in visit on 02/17/21  Comprehensive metabolic panel  Result Value Ref Range   Sodium 139 135 - 145 mEq/L   Potassium 4.5 3.5 - 5.1 mEq/L   Chloride 102 96 - 112 mEq/L   CO2 27 19 - 32 mEq/L   Glucose, Bld 205 (H) 70 - 99 mg/dL   BUN 31 (H) 6 - 23 mg/dL   Creatinine, Ser 1.63 (H) 0.40 - 1.20 mg/dL   Total Bilirubin 0.4 0.2 - 1.2 mg/dL   Alkaline Phosphatase 64 39 - 117 U/L   AST 34 0 - 37 U/L   ALT 32 0 - 35 U/L   Total Protein 7.6 6.0 - 8.3 g/dL   Albumin 4.9 3.5 - 5.2 g/dL   GFR 31.43 (L) >60.00 mL/min   Calcium 10.6 (H) 8.4 - 10.5 mg/dL  Lipid panel  Result Value Ref Range   Cholesterol 189 0 - 200 mg/dL   Triglycerides 340.0 (H) 0.0 - 149.0 mg/dL   HDL 31.40 (L) >39.00 mg/dL   VLDL 68.0 (H) 0.0 - 40.0 mg/dL   Total CHOL/HDL Ratio 6    NonHDL 157.72   Hemoglobin A1c  Result Value Ref Range   Hgb A1c MFr Bld 8.3 (H) 4.6 - 6.5 %  LDL cholesterol, direct  Result Value Ref Range   Direct LDL 116.0 mg/dL    This visit occurred during the SARS-CoV-2 public health emergency.  Safety protocols were in place,  including screening questions prior to the visit, additional usage of staff PPE, and extensive cleaning of exam room while observing appropriate contact time as indicated for disinfecting solutions.   COVID 19 screen:  No recent travel or known exposure to COVID19 The patient denies respiratory symptoms of COVID 19 at this time. The importance of social distancing was discussed today.   Assessment and Plan Problem List Items Addressed This Visit    CKD (chronic kidney disease) stage 3, GFR 30-59 ml/min (HCC)    Stable control.. needs better DMa nd HTN control.      Diabetic retinopathy ([place]) - Primary    Needs better DM control.      Relevant Medications   Dulaglutide (TRULICITY) [id number] 0000000 SOPN   Hyperlipidemia associated with type 2 diabetes mellitus (HCC)    Previously at goal on crestor 20 mg daily.. now slightly above.. focus on diet , exercise and weight loss.. should improve.      Relevant Medications   Dulaglutide (TRULICITY) [id number] SOPN   Hypertension associated with diabetes ([place])    Improving control with addition of amlodipine at last OV  To losartan 100 mg daily.  Not yet at goal. May need further increase in medication if does not improve with weight loss and Trulicity start.      Relevant Medications   Dulaglutide (TRULICITY) [id number]  MG/0.5ML SOPN   Type 2 diabetes mellitus with nephropathy ([place])     Continue metformin ( not idea; to increase given IBS issues) and glyburide.  Add Trulicity for weight loss and DM control.Marland Kitchen goal if tolerating and CBGs decreasing...stop one other med.  Add exercise and continue excellent work on lifestyle changes.      Relevant Medications   Dulaglutide (TRULICITY) 2.42 AS/3.4HD SOPN         Eliezer Lofts, MD

## 2021-02-24 NOTE — Assessment & Plan Note (Signed)
Continue metformin ( not idea; to increase given IBS issues) and glyburide.  Add Trulicity for weight loss and DM control.Marland Kitchen goal if tolerating and CBGs decreasing...stop one other med.  Add exercise and continue excellent work on lifestyle changes.

## 2021-02-24 NOTE — Patient Instructions (Addendum)
Stop calcium supplement. Take vit D 1000 mg daily.  Continue glyburide and  Metformin.  Add Trulicity weekly.  Check fasting blood sugar and 2 hour after meal sugar and bring recordings to next OV. Add 10 min walking each day.

## 2021-02-24 NOTE — Assessment & Plan Note (Signed)
Previously at goal on crestor 20 mg daily.. now slightly above.. focus on diet , exercise and weight loss.. should improve.

## 2021-03-14 ENCOUNTER — Other Ambulatory Visit: Payer: Self-pay

## 2021-03-14 ENCOUNTER — Ambulatory Visit (INDEPENDENT_AMBULATORY_CARE_PROVIDER_SITE_OTHER): Payer: PPO | Admitting: Family Medicine

## 2021-03-14 ENCOUNTER — Encounter: Payer: Self-pay | Admitting: Family Medicine

## 2021-03-14 VITALS — BP 110/60 | HR 73 | Temp 97.9°F | Ht 62.0 in | Wt 155.2 lb

## 2021-03-14 DIAGNOSIS — E1169 Type 2 diabetes mellitus with other specified complication: Secondary | ICD-10-CM

## 2021-03-14 DIAGNOSIS — E1121 Type 2 diabetes mellitus with diabetic nephropathy: Secondary | ICD-10-CM | POA: Diagnosis not present

## 2021-03-14 DIAGNOSIS — E785 Hyperlipidemia, unspecified: Secondary | ICD-10-CM | POA: Diagnosis not present

## 2021-03-14 MED ORDER — TRULICITY 0.75 MG/0.5ML ~~LOC~~ SOAJ: 0.7500 mg | SUBCUTANEOUS | 3 refills | Status: DC

## 2021-03-14 NOTE — Progress Notes (Signed)
Patient ID: April Ross, female    DOB: August 31, 1951, 70 y.o.   MRN: [id number]  This visit was conducted in person.  BP 110/60   Pulse 73   Temp 97.9 F (36.6 C) (Temporal)   Ht 5\' 2"  (1.575 m)   Wt 155 lb 4 oz (70.4 kg)   SpO2 98%   BMI 28.40 kg/m    CC:  Chief Complaint  Patient presents with  . Diabetes    Trulicity Start    Subjective:   HPI: April Ross Much is a 70 y.o. female presenting on 03/14/2021 for Diabetes (Trulicity Start)  Diabetes:   Associated with retinopathy Started Trulicity at last OV on 5/20... she has 3 dose.  NO side effects on this med. Using medications without difficulties: none Hypoglycemic episodes: none measured but two days with shakiness when had to delay lunch. Hyperglycemic episodes: none Feet problems: none Blood Sugars averaging: 84- 126, post prandial 165-171 eye exam within last year: Wt Readings from Last 3 Encounters:  03/14/21 155 lb 4 oz (70.4 kg)  02/24/21 156 lb (70.8 kg)  12/01/20 150 lb (68 kg)   She has noted decreased appetite.   Also taking glyburide and metformin     Relevant past medical, surgical, family and social history reviewed and updated as indicated. Interim medical history since our last visit reviewed. Allergies and medications reviewed and updated. Outpatient Medications Prior to Visit  Medication Sig Dispense Refill  . alendronate (FOSAMAX) 70 MG tablet TAKE 1 TABLET BY MOUTH EVERY 7 DAYS WITH A FULL GLASS OF WATER ON AN EMPTY STOMACH 12 tablet 0  . amLODipine (NORVASC) 5 MG tablet Take 1 tablet (5 mg total) by mouth daily. 90 tablet 11  . aspirin EC 81 MG tablet Take 81 mg by mouth daily. Swallow whole.    . cetirizine (ZYRTEC) 10 MG tablet Take 10 mg by mouth at bedtime as needed for allergies.    Marland Kitchen CINNAMON PO Take 1 tablet by mouth daily.    . Dulaglutide (TRULICITY) 0.17 BL/3.9QZ SOPN Inject 0.75 mg into the skin once a week. 3 mL 11  . fluticasone (FLONASE) 50 MCG/ACT nasal spray Place 2  sprays into both nostrils daily as needed for allergies.    Marland Kitchen glyBURIDE (DIABETA) 5 MG tablet Take 1 tablet (5 mg total) by mouth 2 (two) times daily with a meal. 60 tablet 5  . levothyroxine (SYNTHROID) 88 MCG tablet TAKE 1 TABLET BY MOUTH EVERY DAY 90 tablet 3  . losartan (COZAAR) 100 MG tablet TAKE 1 TABLET BY MOUTH EVERY DAY 90 tablet 1  . metFORMIN (GLUCOPHAGE) 500 MG tablet TAKE 1 TABLET BY MOUTH 2 TIMES DAILY WITH A MEAL. 180 tablet 0  . Omega-3 Fatty Acids (FISH OIL OMEGA-3 PO) Take 1 capsule by mouth daily.    Marland Kitchen omeprazole (PRILOSEC) 20 MG capsule TAKE 1 CAPSULE BY MOUTH EVERY DAY 90 capsule 3  . rosuvastatin (CRESTOR) 20 MG tablet TAKE 1 TABLET BY MOUTH EVERY DAY 90 tablet 3  . tiZANidine (ZANAFLEX) 4 MG tablet TAKE 1 TABLET BY MOUTH AT BEDTIME AS NEEDED FOR MUSCLE SPASMS. 90 tablet 0  . Turmeric (QC TUMERIC COMPLEX PO) Take 1 tablet by mouth daily.    Marland Kitchen VITAMIN D PO Take 2 each by mouth daily.    . Calcium Carb-Cholecalciferol (CALCIUM-VITAMIN D) 500-200 MG-UNIT tablet Take 1 tablet by mouth daily.     No facility-administered medications prior to visit.     Per HPI  unless specifically indicated in ROS section below Review of Systems  Constitutional: Negative for fatigue and fever.       Occ pain in left thoracic back.. improved with muscle relaxant  HENT: Negative for congestion.   Eyes: Negative for pain.  Respiratory: Negative for cough and shortness of breath.   Cardiovascular: Negative for chest pain, palpitations and leg swelling.  Gastrointestinal: Negative for abdominal pain.  Genitourinary: Negative for dysuria and vaginal bleeding.  Musculoskeletal: Negative for back pain.  Neurological: Negative for syncope, light-headedness and headaches.  Psychiatric/Behavioral: Negative for dysphoric mood.   Objective:  BP 110/60   Pulse 73   Temp 97.9 F (36.6 C) (Temporal)   Ht 5\' 2"  (1.575 m)   Wt 155 lb 4 oz (70.4 kg)   SpO2 98%   BMI 28.40 kg/m   Wt Readings from  Last 3 Encounters:  03/14/21 155 lb 4 oz (70.4 kg)  02/24/21 156 lb (70.8 kg)  12/01/20 150 lb (68 kg)      Physical Exam    Results for orders placed or performed in visit on 02/17/21  Comprehensive metabolic panel  Result Value Ref Range   Sodium 139 135 - 145 mEq/L   Potassium 4.5 3.5 - 5.1 mEq/L   Chloride 102 96 - 112 mEq/L   CO2 27 19 - 32 mEq/L   Glucose, Bld 205 (H) 70 - 99 mg/dL   BUN 31 (H) 6 - 23 mg/dL   Creatinine, Ser 1.63 (H) 0.40 - 1.20 mg/dL   Total Bilirubin 0.4 0.2 - 1.2 mg/dL   Alkaline Phosphatase 64 39 - 117 U/L   AST 34 0 - 37 U/L   ALT 32 0 - 35 U/L   Total Protein 7.6 6.0 - 8.3 g/dL   Albumin 4.9 3.5 - 5.2 g/dL   GFR 31.43 (L) >60.00 mL/min   Calcium 10.6 (H) 8.4 - 10.5 mg/dL  Lipid panel  Result Value Ref Range   Cholesterol 189 0 - 200 mg/dL   Triglycerides 340.0 (H) 0.0 - 149.0 mg/dL   HDL 31.40 (L) >39.00 mg/dL   VLDL 68.0 (H) 0.0 - 40.0 mg/dL   Total CHOL/HDL Ratio 6    NonHDL 157.72   Hemoglobin A1c  Result Value Ref Range   Hgb A1c MFr Bld 8.3 (H) 4.6 - 6.5 %  LDL cholesterol, direct  Result Value Ref Range   Direct LDL 116.0 mg/dL    This visit occurred during the SARS-CoV-2 public health emergency.  Safety protocols were in place, including screening questions prior to the visit, additional usage of staff PPE, and extensive cleaning of exam room while observing appropriate contact time as indicated for disinfecting solutions.   COVID 19 screen:  No recent travel or known exposure to COVID19 The patient denies respiratory symptoms of COVID 19 at this time. The importance of social distancing was discussed today.   Assessment and Plan Problem List Items Addressed This Visit    Hyperlipidemia associated with type 2 diabetes mellitus ([place])    Re-eval in 3 months.   on crestor 20 mg daily      Relevant Medications   Dulaglutide (TRULICITY) 8.84 ZY/6.0YT SOPN   Type 2 diabetes mellitus with nephropathy ([place]) - Primary    Improving  control with Trulicity. No SE, tolerable cost, no concerns about administration.   Continue current dose.Marland Kitchen as CBGs dropping... can hold glipizide.   re-eval with A1c in 3 months.      Relevant Medications   Dulaglutide (  TRULICITY) 8.29 FA/2.1HY SOPN   Other Relevant Orders   Hemoglobin A1c   Lipid panel   Comprehensive metabolic panel         Eliezer Lofts, MD

## 2021-03-14 NOTE — Assessment & Plan Note (Signed)
Re-eval in 3 months.   on crestor 20 mg daily

## 2021-03-14 NOTE — Assessment & Plan Note (Signed)
Improving control with Trulicity. No SE, tolerable cost, no concerns about administration.   Continue current dose.Marland Kitchen as CBGs dropping... can hold glipizide.   re-eval with A1c in 3 months.

## 2021-03-14 NOTE — Patient Instructions (Signed)
If blood sugars continuing to drop.. hold glyburide.  Continue Trulicity at 9.62 weekly dose.

## 2021-03-29 DIAGNOSIS — E113291 Type 2 diabetes mellitus with mild nonproliferative diabetic retinopathy without macular edema, right eye: Secondary | ICD-10-CM | POA: Diagnosis not present

## 2021-03-29 DIAGNOSIS — H353121 Nonexudative age-related macular degeneration, left eye, early dry stage: Secondary | ICD-10-CM | POA: Diagnosis not present

## 2021-03-29 DIAGNOSIS — H2513 Age-related nuclear cataract, bilateral: Secondary | ICD-10-CM | POA: Diagnosis not present

## 2021-04-18 ENCOUNTER — Other Ambulatory Visit: Payer: Self-pay | Admitting: Family Medicine

## 2021-04-28 ENCOUNTER — Ambulatory Visit
Admission: RE | Admit: 2021-04-28 | Discharge: 2021-04-28 | Disposition: A | Discharge status: Home / Self Care | Payer: PPO | Source: Ambulatory Visit | Attending: Family Medicine | Admitting: Family Medicine

## 2021-04-28 ENCOUNTER — Other Ambulatory Visit: Payer: Self-pay

## 2021-04-28 DIAGNOSIS — Z1231 Encounter for screening mammogram for malignant neoplasm of breast: Secondary | ICD-10-CM

## 2021-04-28 DIAGNOSIS — M810 Age-related osteoporosis without current pathological fracture: Secondary | ICD-10-CM

## 2021-04-28 DIAGNOSIS — Z780 Asymptomatic menopausal state: Secondary | ICD-10-CM | POA: Diagnosis not present

## 2021-04-28 IMAGING — MG MM DIGITAL SCREENING BILAT W/ TOMO AND CAD
8 series · 9 of 24 positions shown · non-contrast
Comparison: Previous exam(s).

CLINICAL DATA: Screening.

EXAM:
DIGITAL SCREENING BILATERAL MAMMOGRAM WITH TOMOSYNTHESIS AND CAD
TECHNIQUE: Bilateral screening digital craniocaudal and mediolateral oblique
mammograms were obtained. Bilateral screening digital breast
tomosynthesis was performed. The images were evaluated with
computer-aided detection.

[R MLO synth-2D]
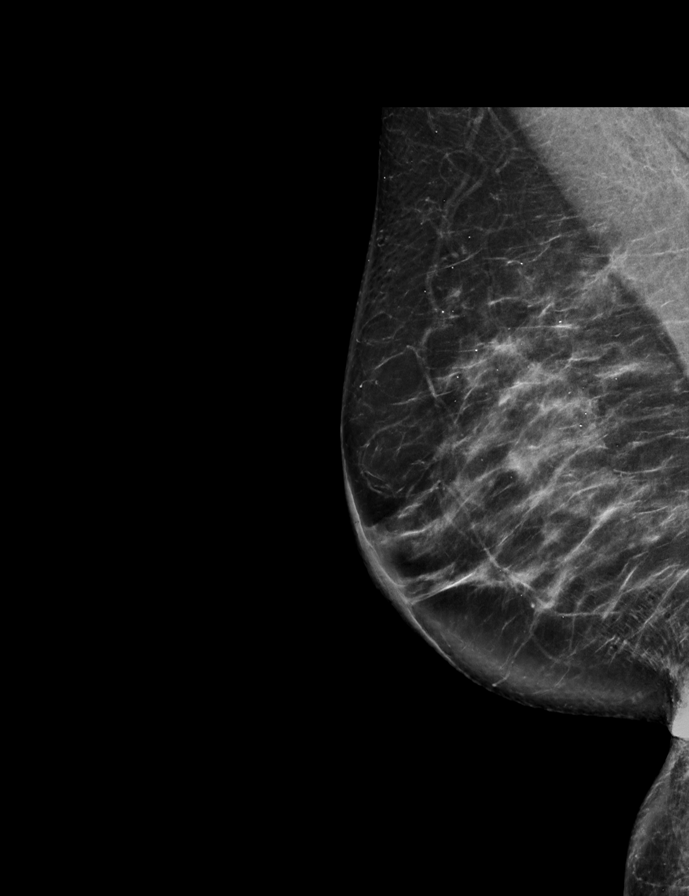

[R CC synth-2D]
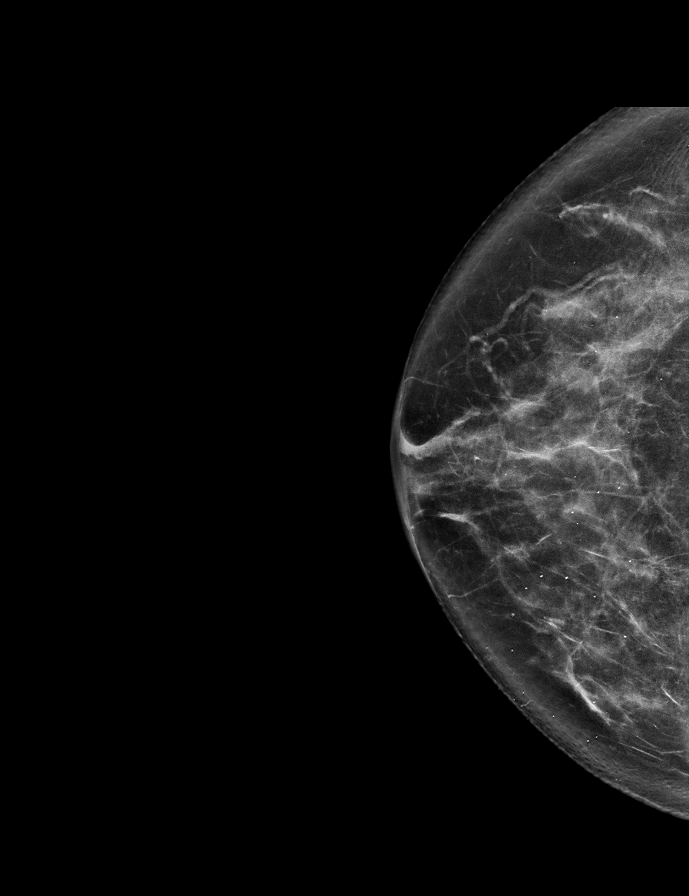

[L CC synth-2D]
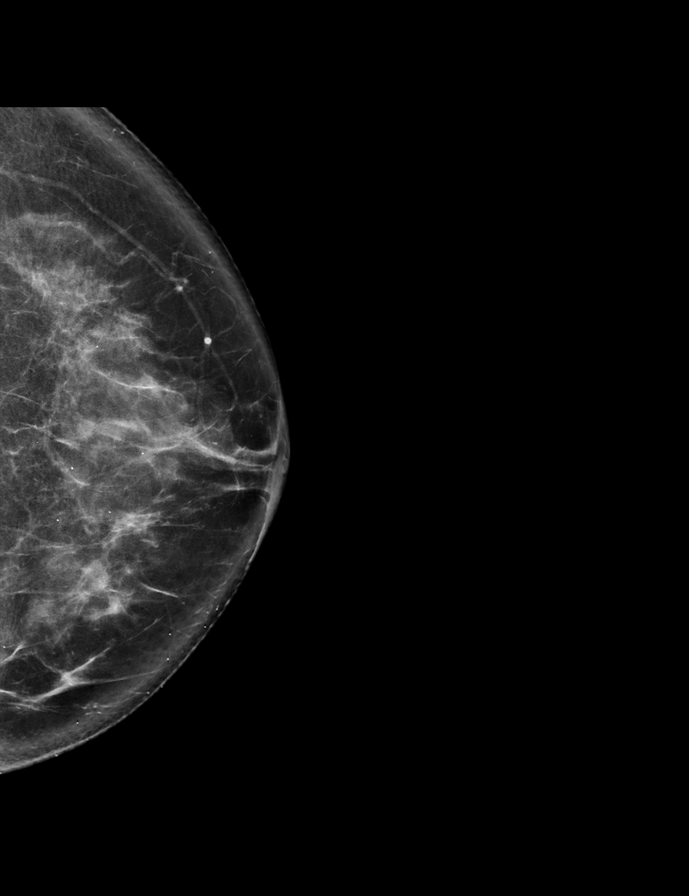

[L MLO synth-2D]
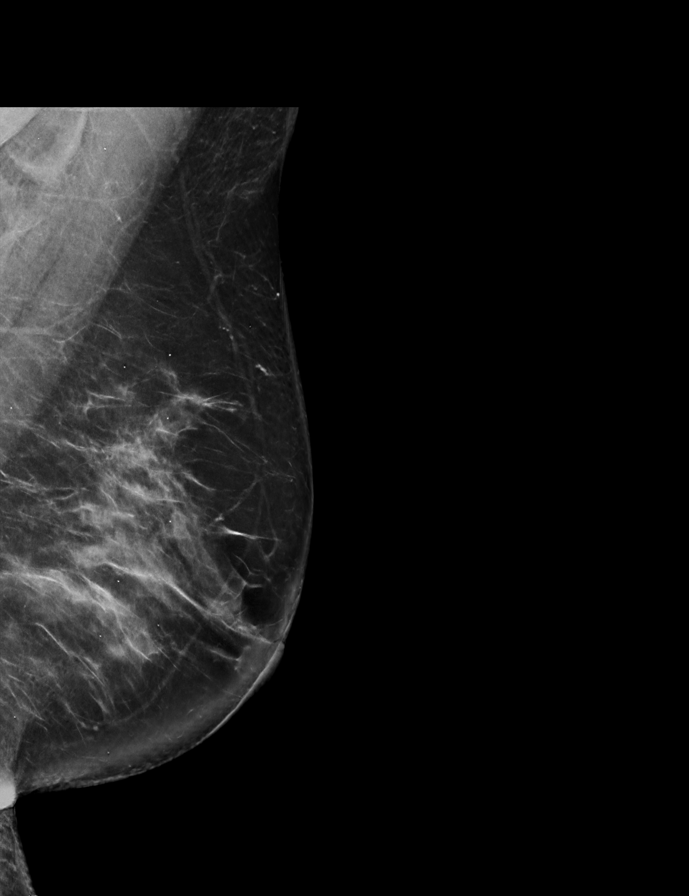

[L CC tomo · 2 of 77 frames shown]
[frame 25/77]
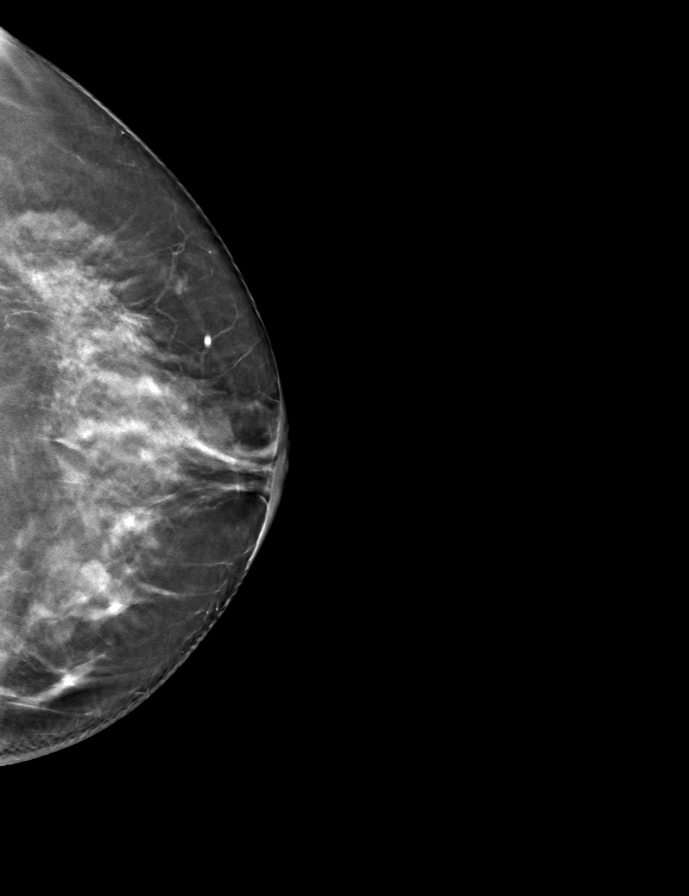
[frame 39/77]
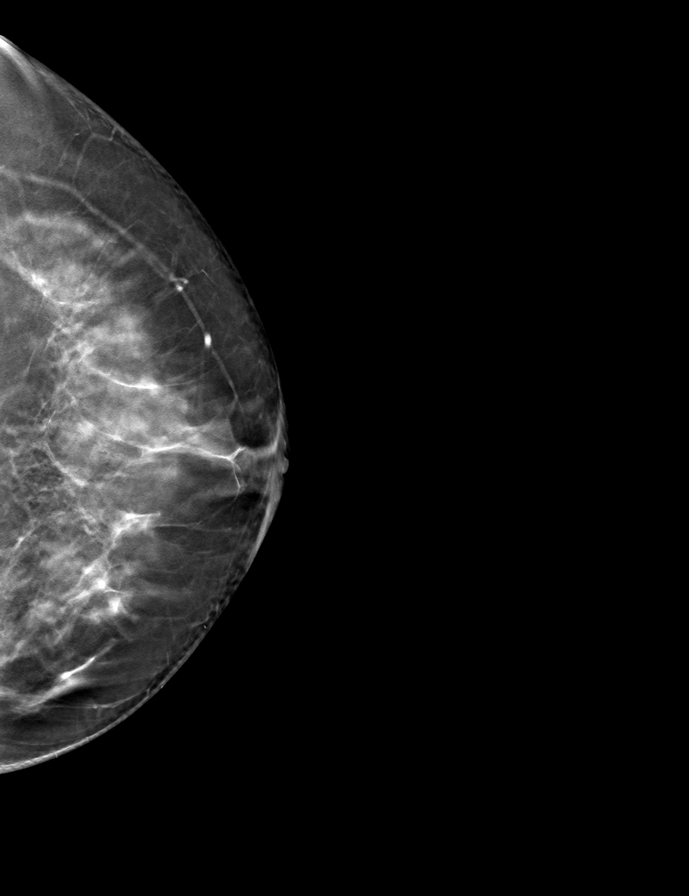

[R CC tomo · tomo slice 37/73.0]
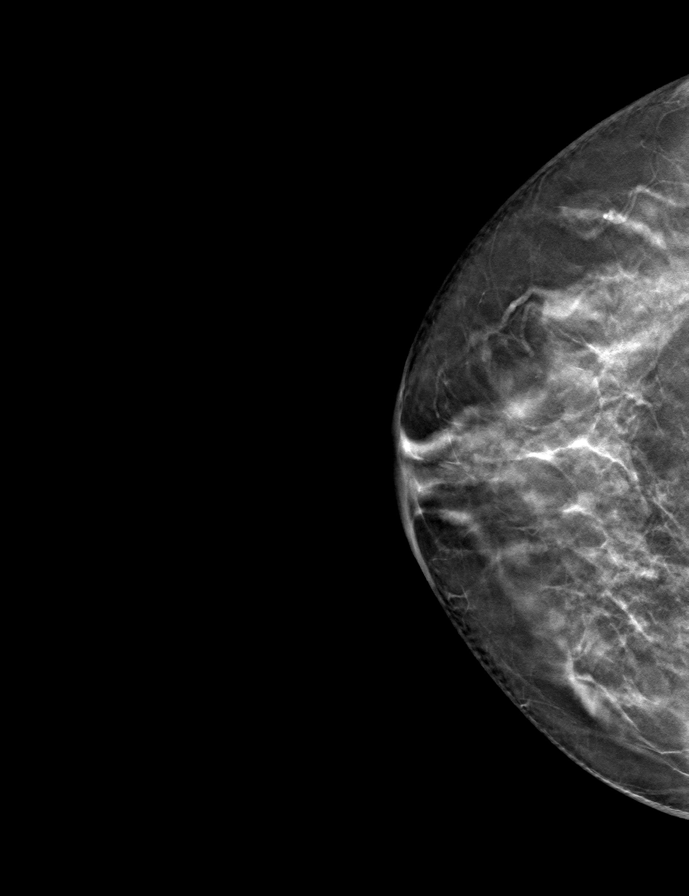

[L MLO tomo · tomo slice 43/86.0]
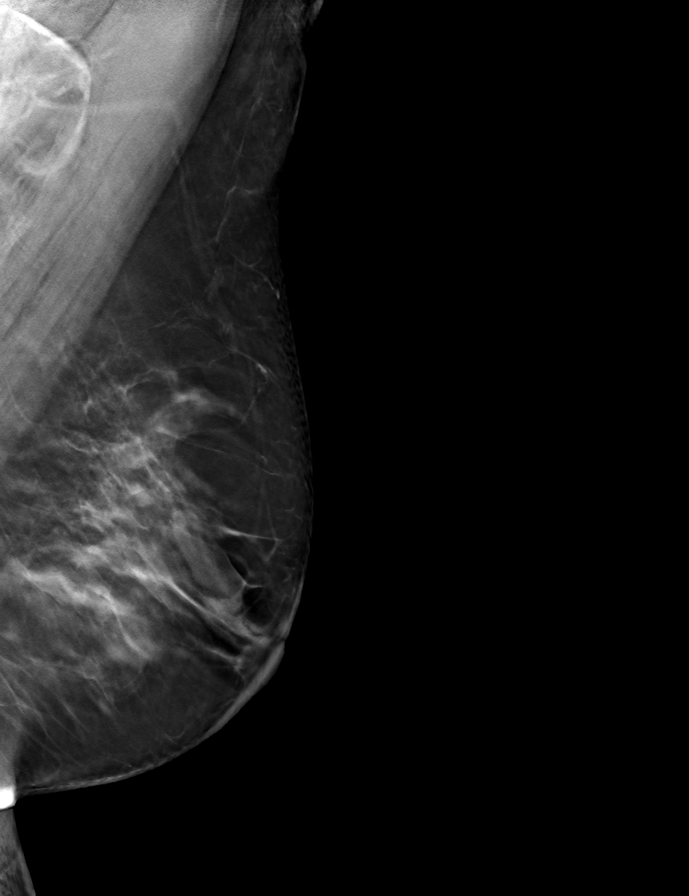

[R MLO tomo · tomo slice 42/83.0]
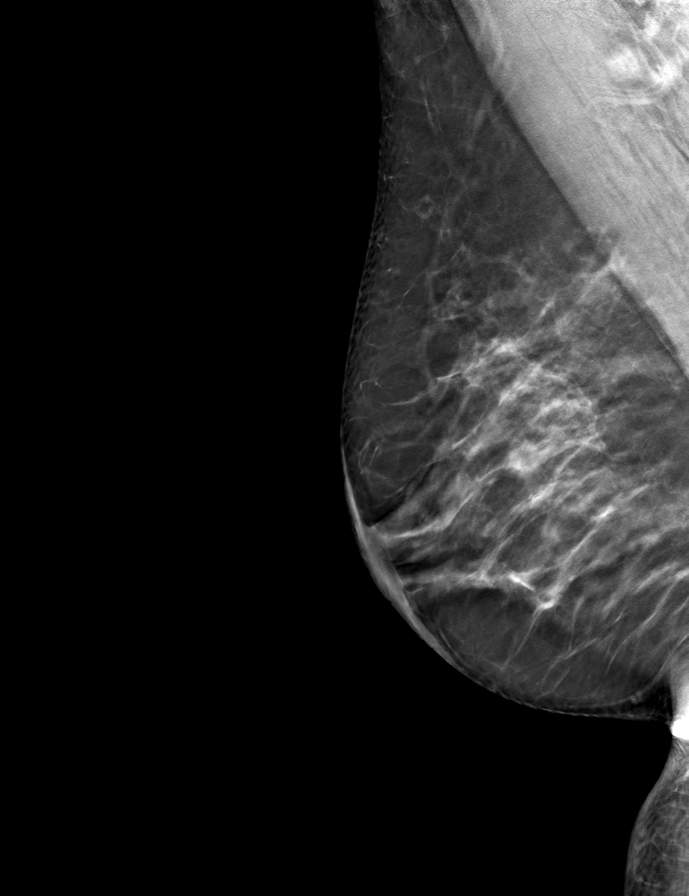

[9 of 24 positions shown; findings below may reference images not displayed]

ACR Breast Density Category c: The breast tissue is heterogeneously
dense, which may obscure small masses.
FINDINGS: In the right breast, a possible asymmetry warrants further
evaluation. In the left breast, no findings suspicious for
malignancy.
IMPRESSION: Further evaluation is suggested for possible asymmetry in the right
breast.

RECOMMENDATION:
Diagnostic mammogram and possibly ultrasound of the right breast.
(Code:[AN])

The patient will be contacted regarding the findings, and additional
imaging will be scheduled.

BI-RADS CATEGORY  0: Incomplete. Need additional imaging evaluation
and/or prior mammograms for comparison.

## 2021-05-02 ENCOUNTER — Other Ambulatory Visit: Payer: Self-pay | Admitting: Family Medicine

## 2021-05-02 DIAGNOSIS — R928 Other abnormal and inconclusive findings on diagnostic imaging of breast: Secondary | ICD-10-CM

## 2021-05-11 ENCOUNTER — Other Ambulatory Visit: Payer: Self-pay | Admitting: Family Medicine

## 2021-05-12 ENCOUNTER — Other Ambulatory Visit: Payer: Self-pay | Admitting: Family Medicine

## 2021-05-12 NOTE — Telephone Encounter (Signed)
Last office visit 03/14/21 for DM.  Last refilled 02/14/2021 for #90 with no refills.  Next Appt: 09/09/2022f or DM.

## 2021-05-22 ENCOUNTER — Other Ambulatory Visit: Payer: Self-pay | Admitting: Family Medicine

## 2021-05-22 ENCOUNTER — Ambulatory Visit
Admission: RE | Admit: 2021-05-22 | Discharge: 2021-05-22 | Disposition: A | Discharge status: Home / Self Care | Payer: PPO | Source: Ambulatory Visit | Attending: Family Medicine | Admitting: Family Medicine

## 2021-05-22 ENCOUNTER — Other Ambulatory Visit: Payer: Self-pay

## 2021-05-22 DIAGNOSIS — N6489 Other specified disorders of breast: Secondary | ICD-10-CM

## 2021-05-22 DIAGNOSIS — R928 Other abnormal and inconclusive findings on diagnostic imaging of breast: Secondary | ICD-10-CM

## 2021-05-22 DIAGNOSIS — R922 Inconclusive mammogram: Secondary | ICD-10-CM | POA: Diagnosis not present

## 2021-05-22 IMAGING — MG MM DIGITAL DIAGNOSTIC UNILAT*R* W/ TOMO W/ CAD
8 series · 8 of 24 positions shown · non-contrast
Comparison: Previous exam(s).

CLINICAL DATA: Right breast asymmetry.

EXAM:
DIGITAL DIAGNOSTIC UNILATERAL RIGHT MAMMOGRAM WITH TOMOSYNTHESIS AND
CAD; ULTRASOUND RIGHT BREAST LIMITED
TECHNIQUE: Right digital diagnostic mammography and breast tomosynthesis was
performed. The images were evaluated with computer-aided detection.;
Targeted ultrasound examination of the right breast was performed

[R MLO synth-2D (1 of 2)]
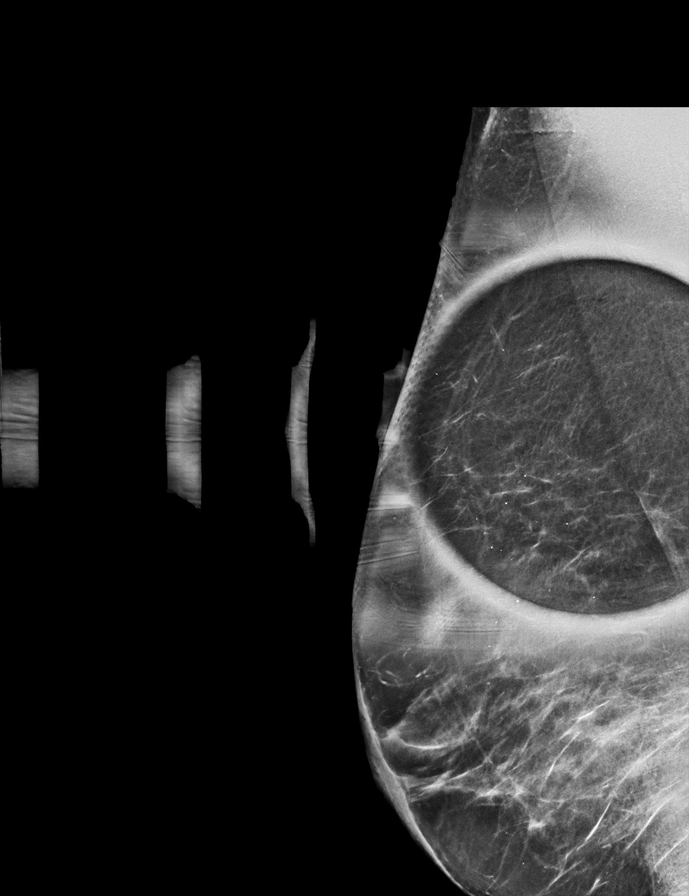

[R MLO synth-2D (2 of 2)]
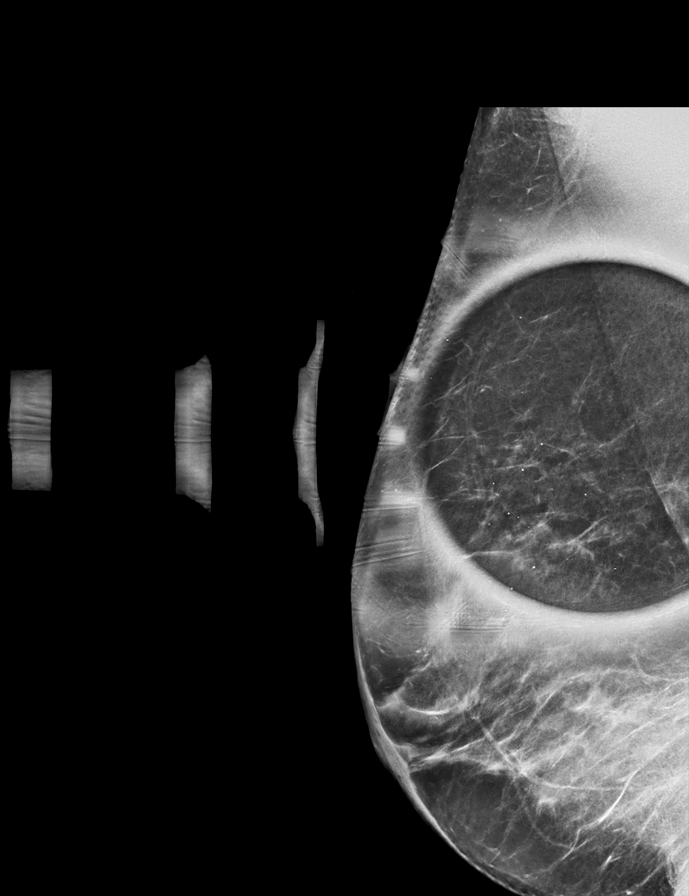

[R ML synth-2D]
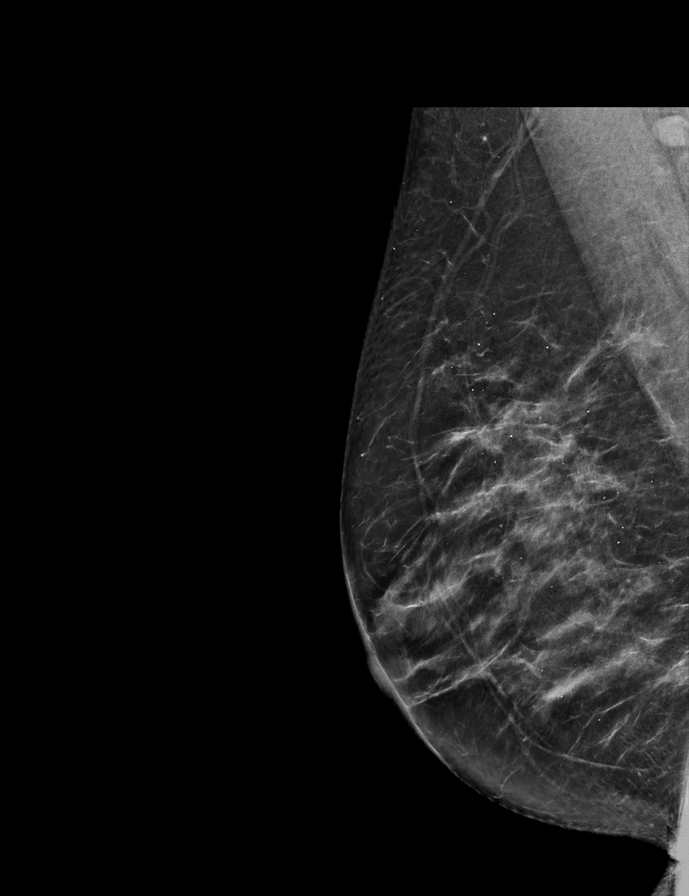

[R XCCL synth-2D]
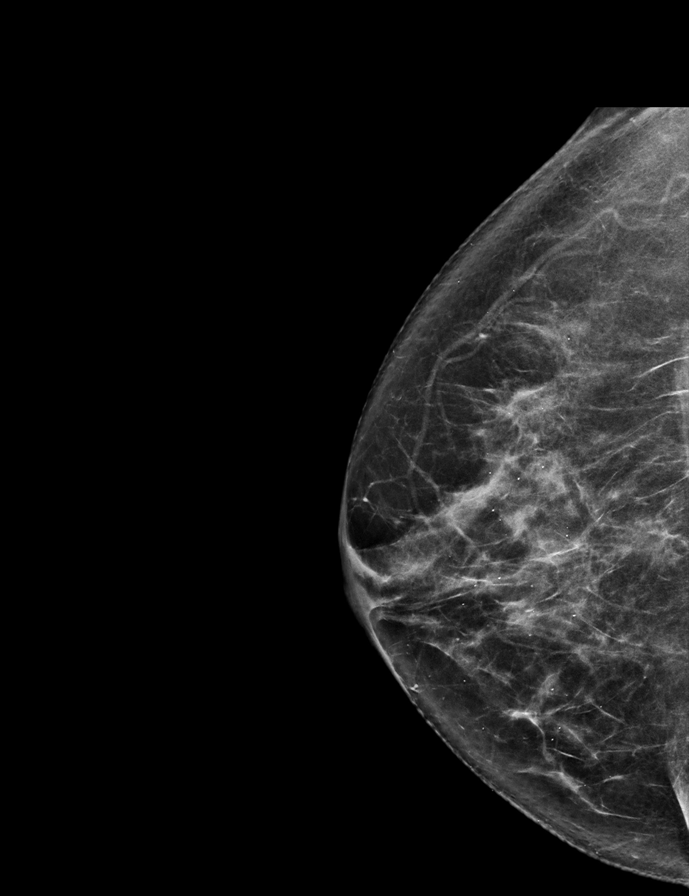

[R ML tomo · tomo slice 34/67.0]
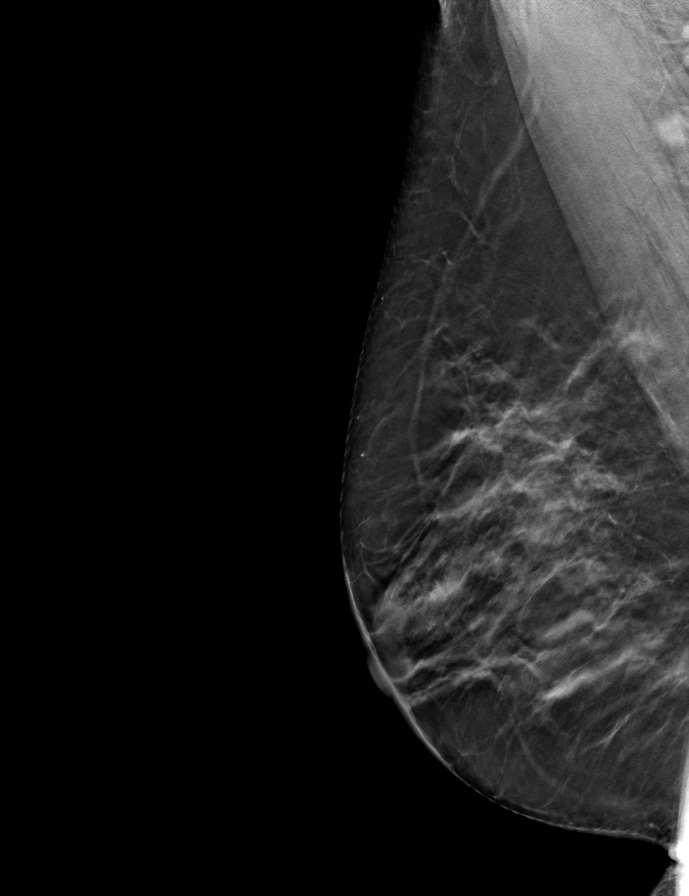

[R MLO tomo (1 of 2) · tomo slice 33/66.0]
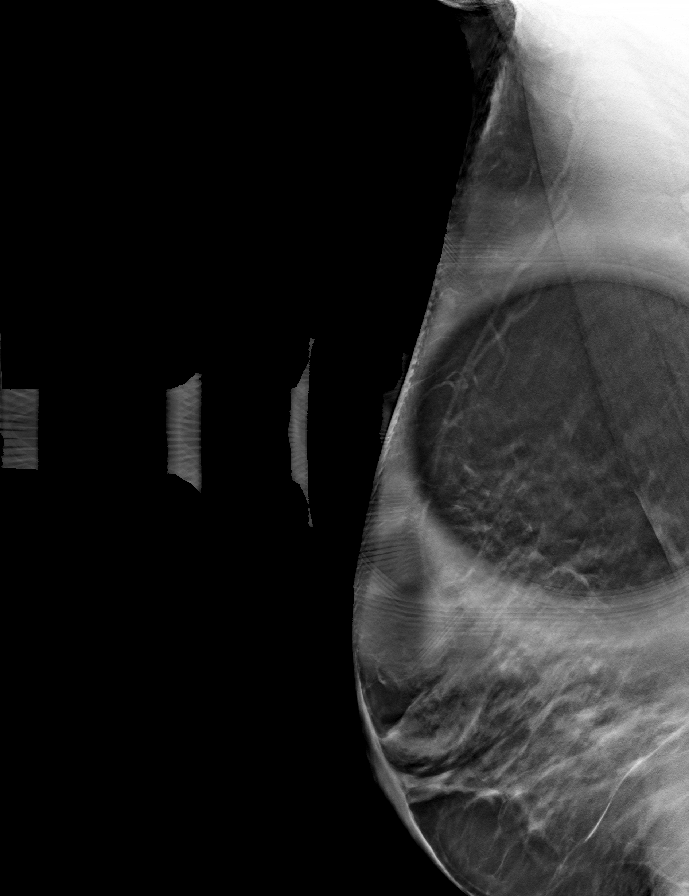

[R XCCL tomo · tomo slice 35/70.0]
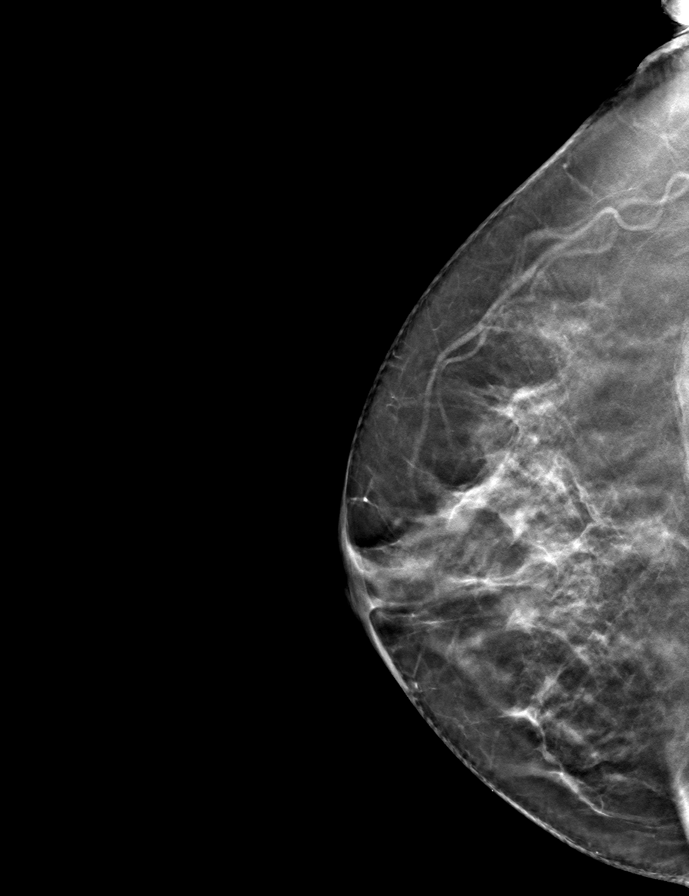

[R MLO tomo (2 of 2) · tomo slice 29/58.0]
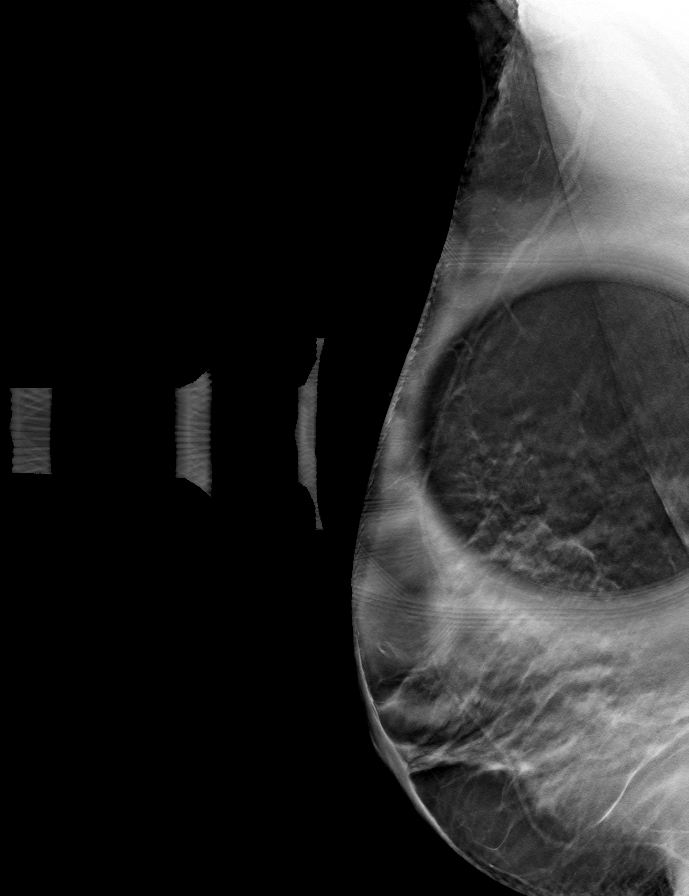

[8 of 24 positions shown; findings below may reference images not displayed]

ACR Breast Density Category c: The breast tissue is heterogeneously
dense, which may obscure small masses.
FINDINGS: The previously described asymmetry persists on additional views as
an area of architectural distortion with possible associated
asymmetry at the 12 o'clock position, far posterior depth.

Targeted ultrasound is performed by the physician from the 10
o'clock to 2 o'clock positions. No sonographic correlate or
suspicious solid or cystic mass identified.
IMPRESSION: Suspicious architectural distortion at the right breast 12 o'clock
position far posterior depth. Recommend stereotactic guided biopsy.

RECOMMENDATION:
Stereotactic guided biopsy of the right breast.

I have discussed the findings and recommendations with the patient.
If applicable, a reminder letter will be sent to the patient
regarding the next appointment.

BI-RADS CATEGORY  4: Suspicious.

## 2021-05-22 IMAGING — US US BREAST*R* LIMITED INC AXILLA
1 series · 5 of 5 positions shown · non-contrast
Comparison: Previous exam(s).

CLINICAL DATA: Right breast asymmetry.

EXAM:
DIGITAL DIAGNOSTIC UNILATERAL RIGHT MAMMOGRAM WITH TOMOSYNTHESIS AND
CAD; ULTRASOUND RIGHT BREAST LIMITED
TECHNIQUE: Right digital diagnostic mammography and breast tomosynthesis was
performed. The images were evaluated with computer-aided detection.;
Targeted ultrasound examination of the right breast was performed

[Series 1: us breast*right* limited inc axilla · 0.06mm/px · 5 of 5 slices shown]
[im 1/5]
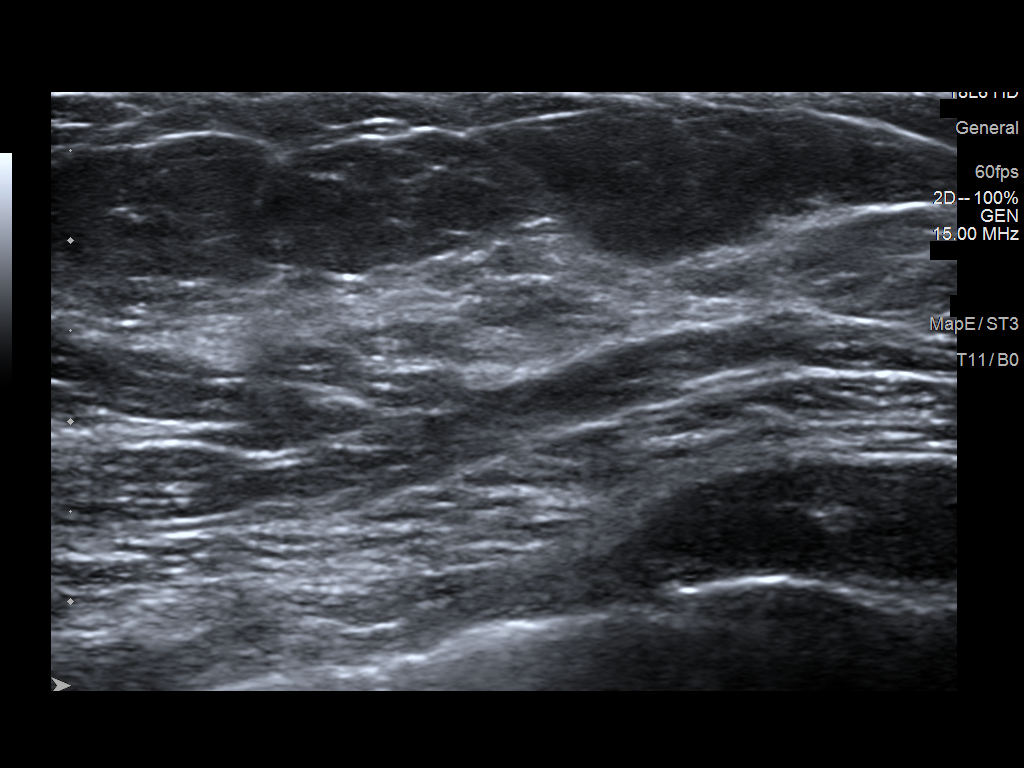
[im 2/5]
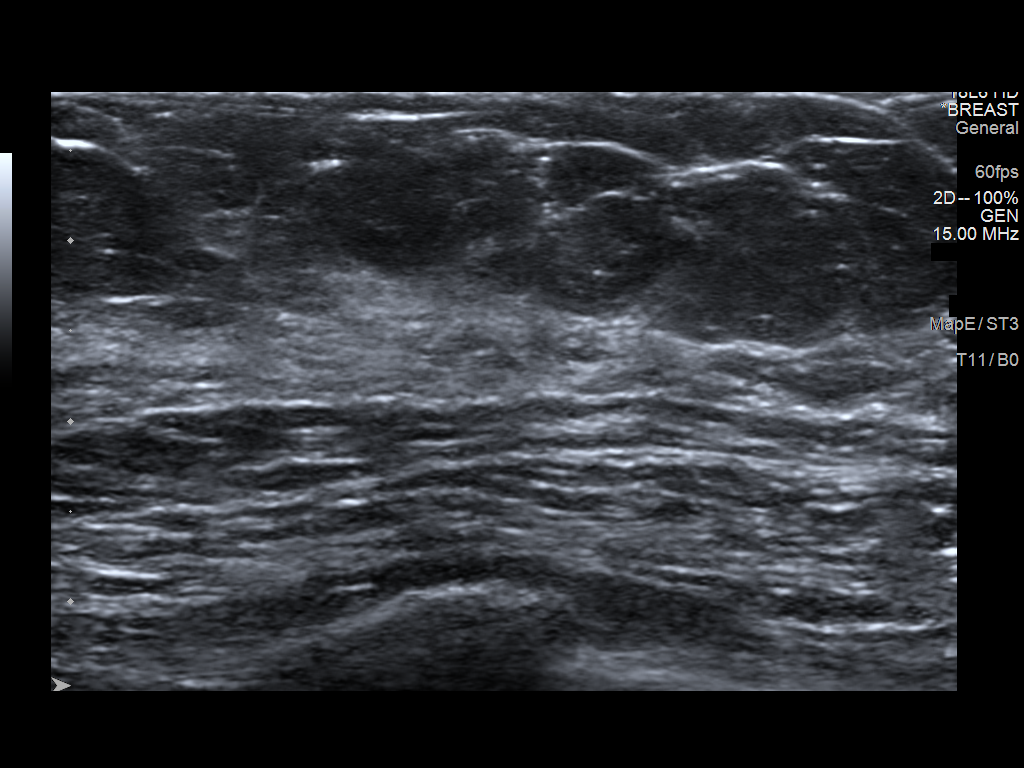
[im 3/5]
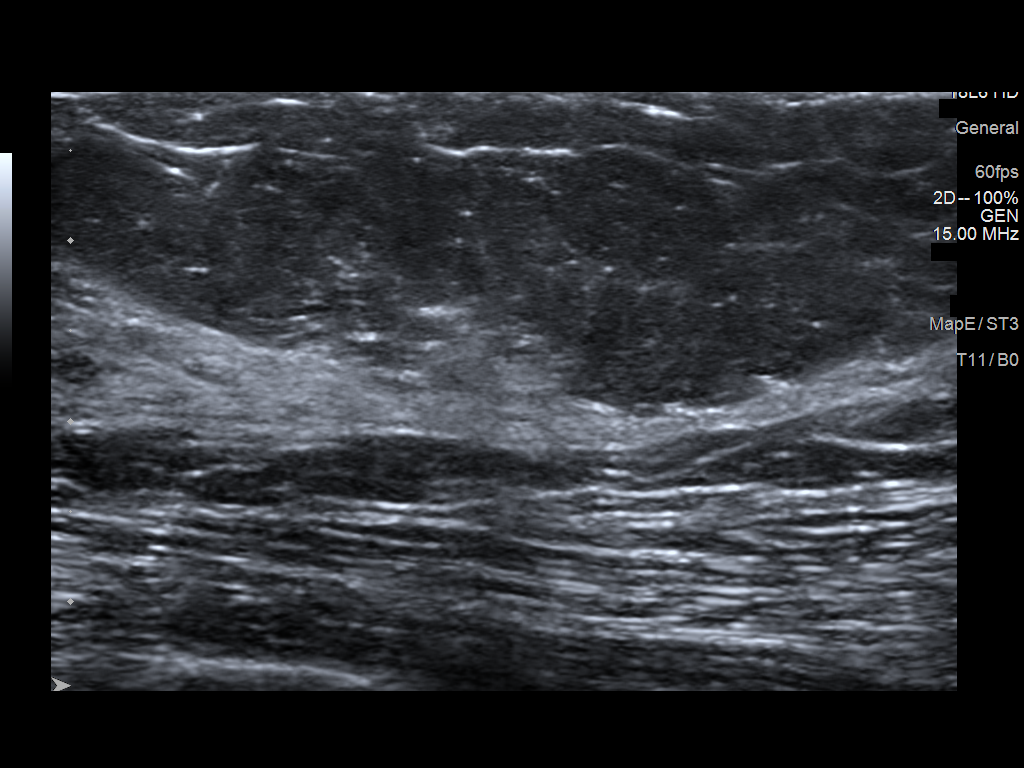
[im 4/5]
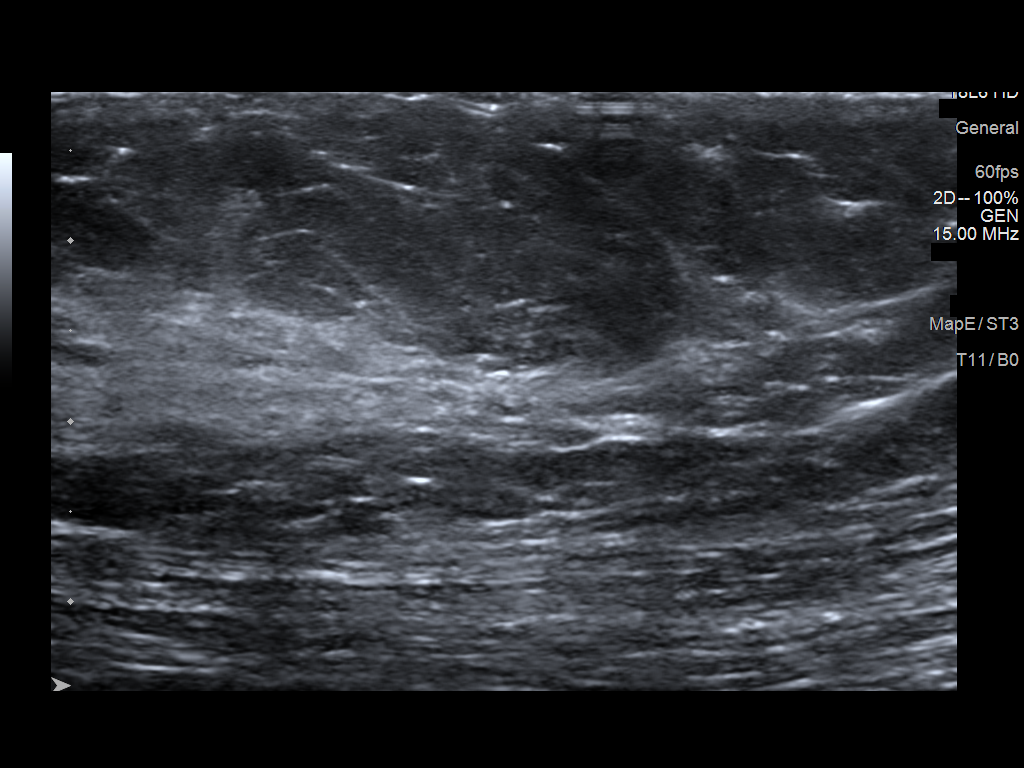
[im 5/5]
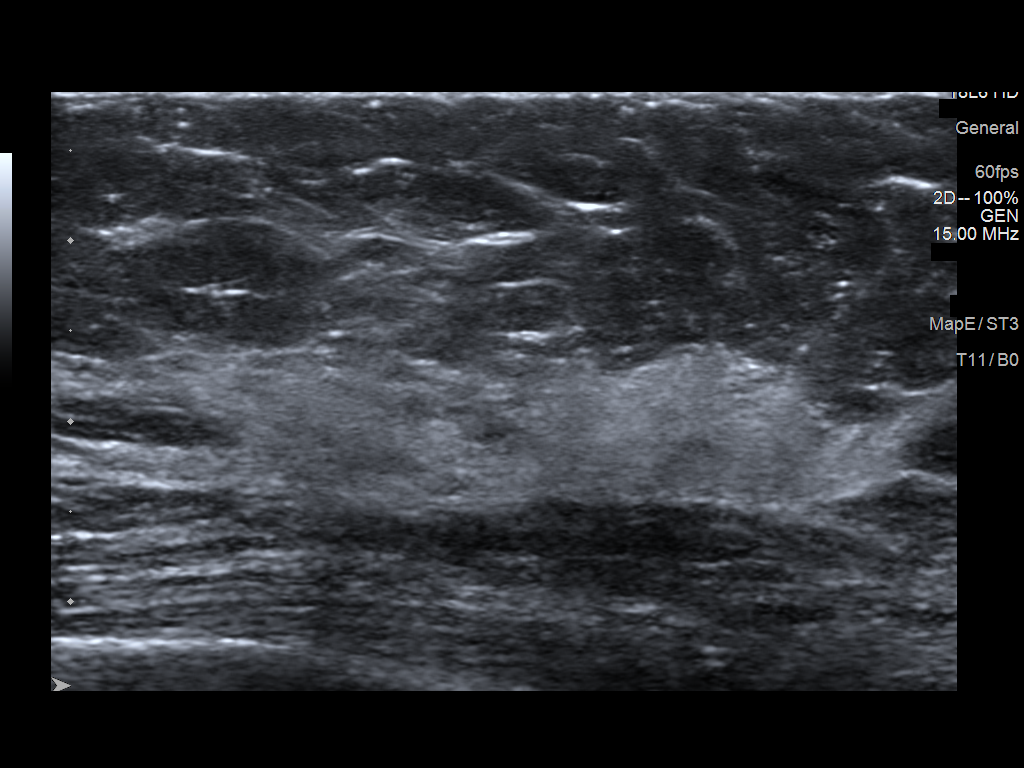

[5 of 5 positions shown; findings below may reference images not displayed]

ACR Breast Density Category c: The breast tissue is heterogeneously
dense, which may obscure small masses.
FINDINGS: The previously described asymmetry persists on additional views as
an area of architectural distortion with possible associated
asymmetry at the 12 o'clock position, far posterior depth.

Targeted ultrasound is performed by the physician from the 10
o'clock to 2 o'clock positions. No sonographic correlate or
suspicious solid or cystic mass identified.
IMPRESSION: Suspicious architectural distortion at the right breast 12 o'clock
position far posterior depth. Recommend stereotactic guided biopsy.

RECOMMENDATION:
Stereotactic guided biopsy of the right breast.

I have discussed the findings and recommendations with the patient.
If applicable, a reminder letter will be sent to the patient
regarding the next appointment.

BI-RADS CATEGORY  4: Suspicious.

## 2021-06-01 ENCOUNTER — Ambulatory Visit
Admission: RE | Admit: 2021-06-01 | Discharge: 2021-06-01 | Disposition: A | Discharge status: Home / Self Care | Payer: PPO | Source: Ambulatory Visit | Attending: Family Medicine | Admitting: Family Medicine

## 2021-06-01 ENCOUNTER — Other Ambulatory Visit: Payer: Self-pay

## 2021-06-01 DIAGNOSIS — R928 Other abnormal and inconclusive findings on diagnostic imaging of breast: Secondary | ICD-10-CM | POA: Diagnosis not present

## 2021-06-01 DIAGNOSIS — N6489 Other specified disorders of breast: Secondary | ICD-10-CM

## 2021-06-01 DIAGNOSIS — C50211 Malignant neoplasm of upper-inner quadrant of right female breast: Secondary | ICD-10-CM | POA: Diagnosis not present

## 2021-06-01 IMAGING — MG MM BREAST BX W/ LOC DEV 1ST LESION IMAGE BX SPEC STEREO GUIDE*R*
8 of 14 series · 8 of 30 positions shown · non-contrast
Comparison: Previous exams.
COMPARISON: Previous exams.

Addendum:
CLINICAL DATA: Patient with right breast distortion.

EXAM:
RIGHT BREAST STEREOTACTIC CORE NEEDLE BIOPSY

[R (1 of 8)]
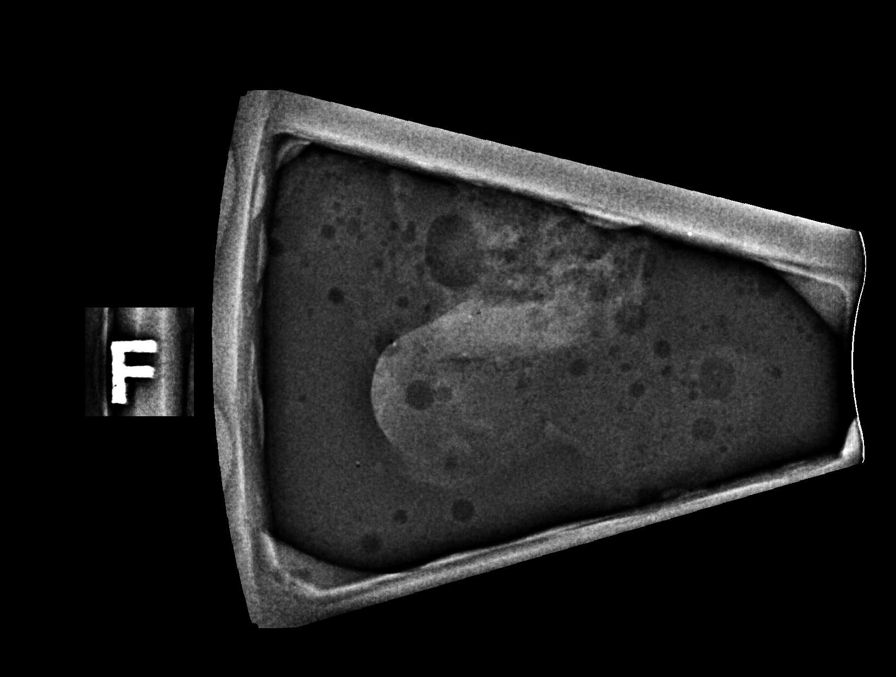

[R (2 of 8)]
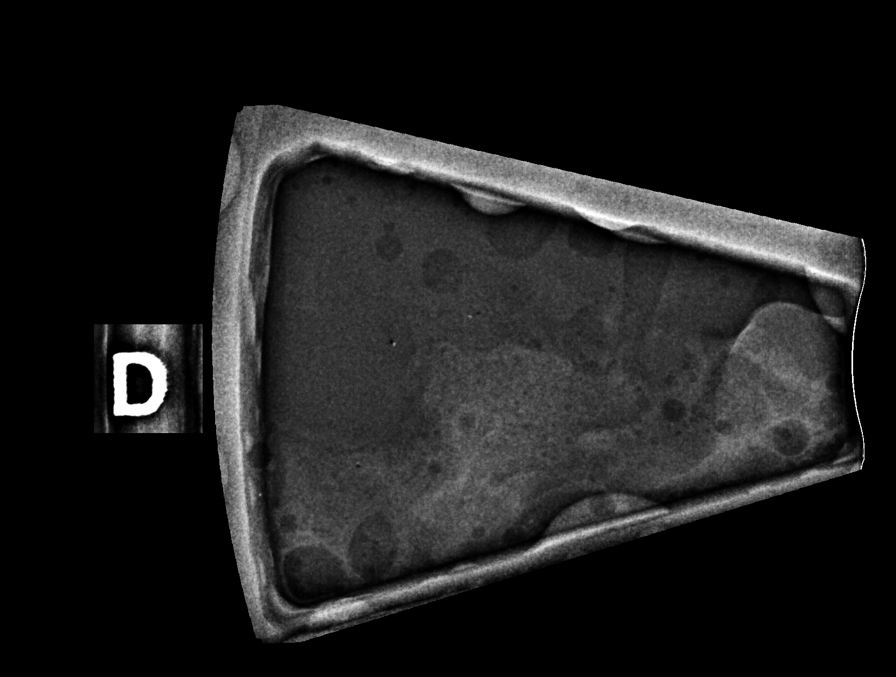

[R (3 of 8)]
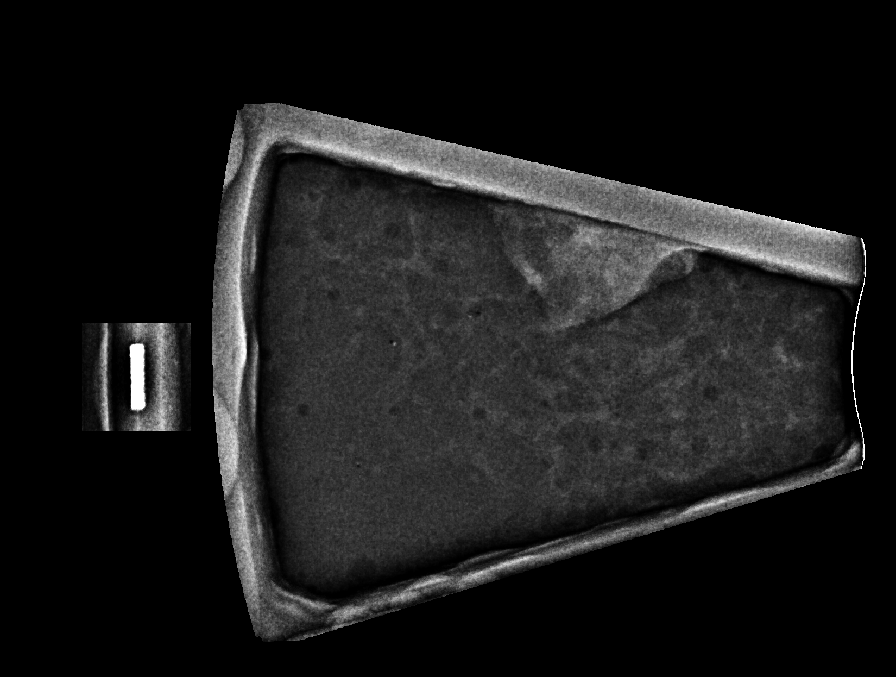

[R (4 of 8)]
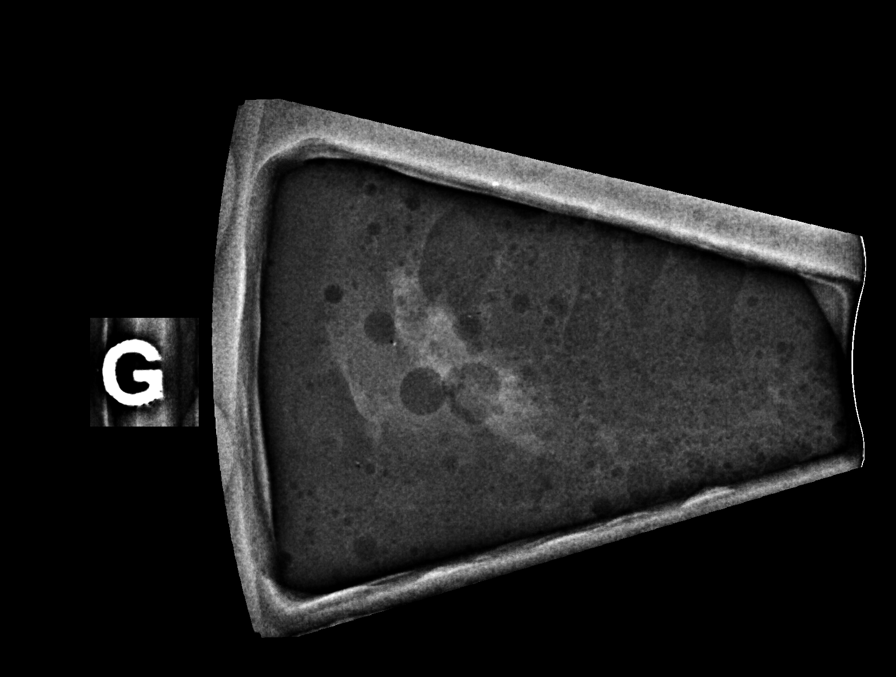

[R (5 of 8)]
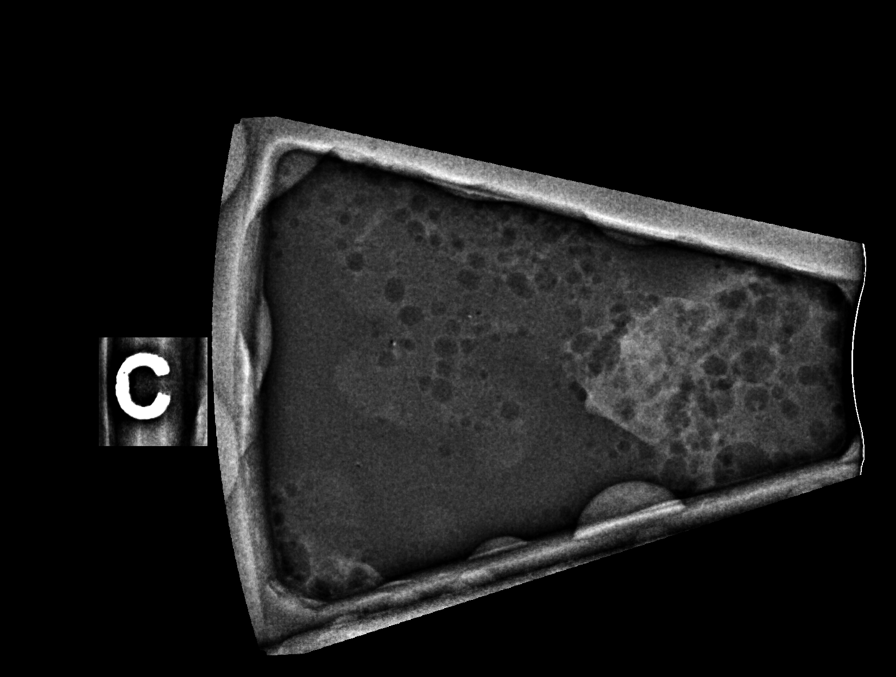

[R (6 of 8)]
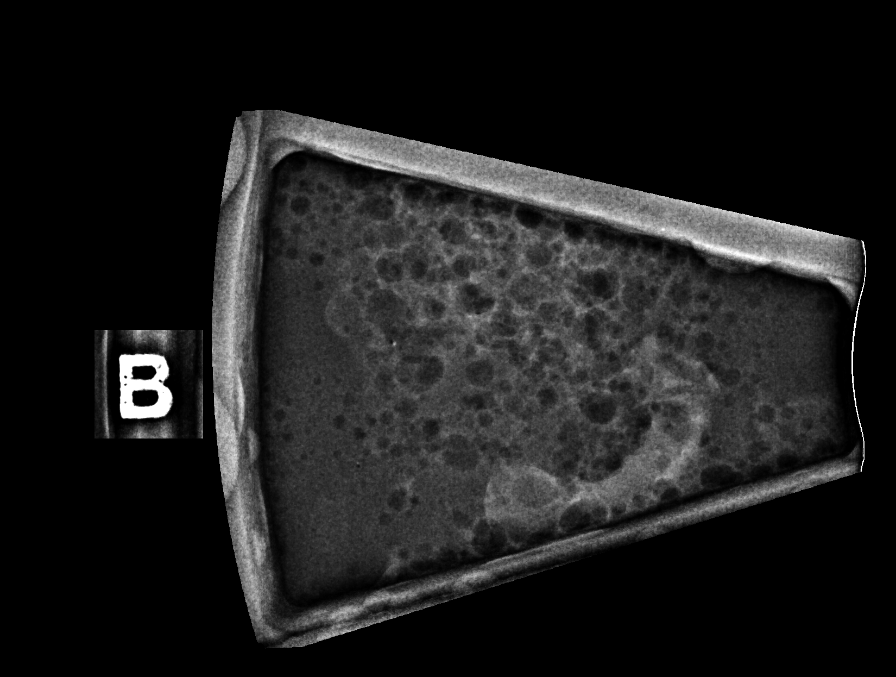

[R (7 of 8)]
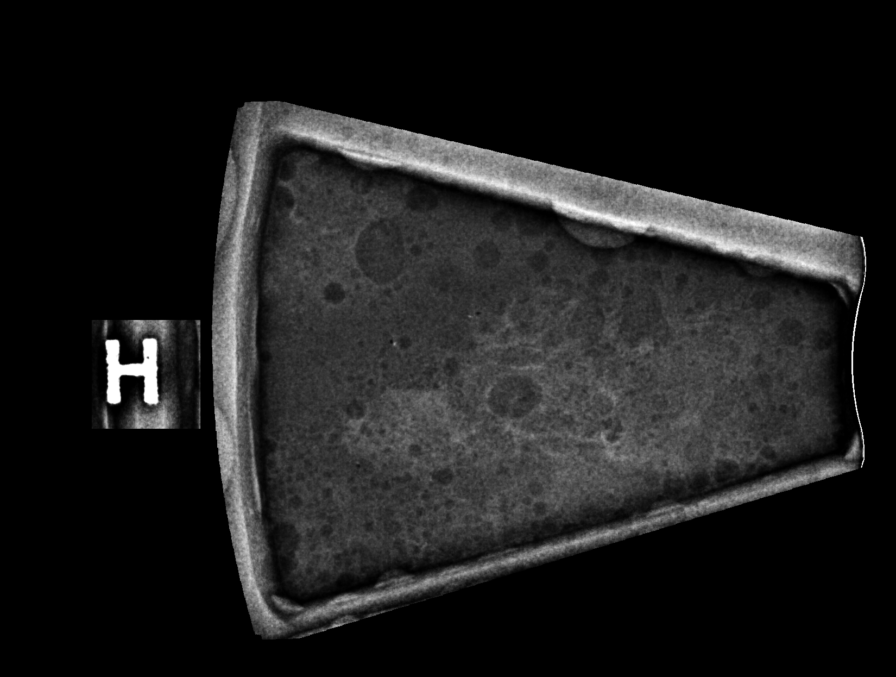

[R (8 of 8)]
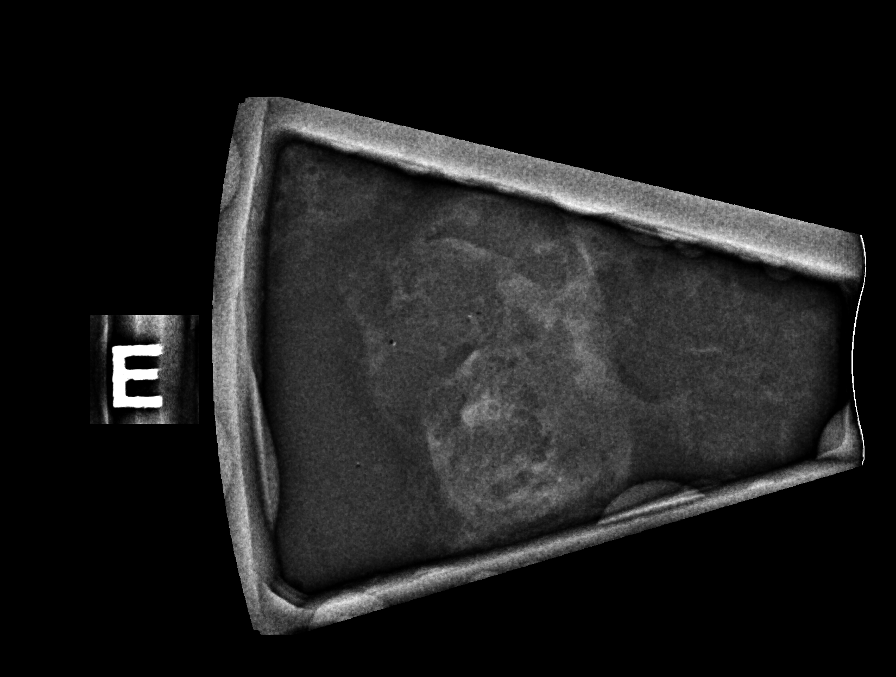

[8 of 30 positions shown; findings below may reference images not displayed]



Using sterile technique and 1% Lidocaine as local anesthetic, under
stereotactic guidance, a 9 gauge vacuum assisted device was used to
perform core needle biopsy of distortion within the posteromedial
right breast using a cranial approach.

Lesion quadrant: Upper inner quadrant

At the conclusion of the procedure, X shaped tissue marker clip was
deployed into the biopsy cavity. Follow-up 2-view mammogram was
performed and dictated separately.
IMPRESSION: Stereotactic-guided biopsy of right breast distortion. No apparent
complications.

ADDENDUM:
Pathology revealed GRADE II INVASIVE MAMMARY CARCINOMA, MAMMARY
CARCINOMA IN SITU of the RIGHT breast, upper inner, X clip. This was
found to be concordant by Dr. ROMMERT.

Pathology results were discussed with the patient by telephone. The
patient reported doing well after the biopsy with tenderness at the
site. Post biopsy instructions and care were reviewed and questions
were answered. The patient was encouraged to call The [REDACTED]

Surgical consultation has been arranged with Dr. ROMMERT at
[REDACTED] on [DATE].

The patient was asked to return for a right axillary ultrasound on
[DATE].

Pathology results reported by ROMMERT RN on [DATE].



Using sterile technique and 1% Lidocaine as local anesthetic, under
stereotactic guidance, a 9 gauge vacuum assisted device was used to
perform core needle biopsy of distortion within the posteromedial
right breast using a cranial approach.

Lesion quadrant: Upper inner quadrant

At the conclusion of the procedure, X shaped tissue marker clip was
deployed into the biopsy cavity. Follow-up 2-view mammogram was
performed and dictated separately.
IMPRESSION: Stereotactic-guided biopsy of right breast distortion. No apparent
complications.

## 2021-06-01 IMAGING — MG MM BREAST LOCALIZATION CLIP
6 series · 6 of 18 positions shown · non-contrast
Comparison: Previous exam(s).

CLINICAL DATA: Patient with right breast distortion.

EXAM:
3D DIAGNOSTIC RIGHT MAMMOGRAM POST STEREOTACTIC BIOPSY

[R ML synth-2D (1 of 2)]
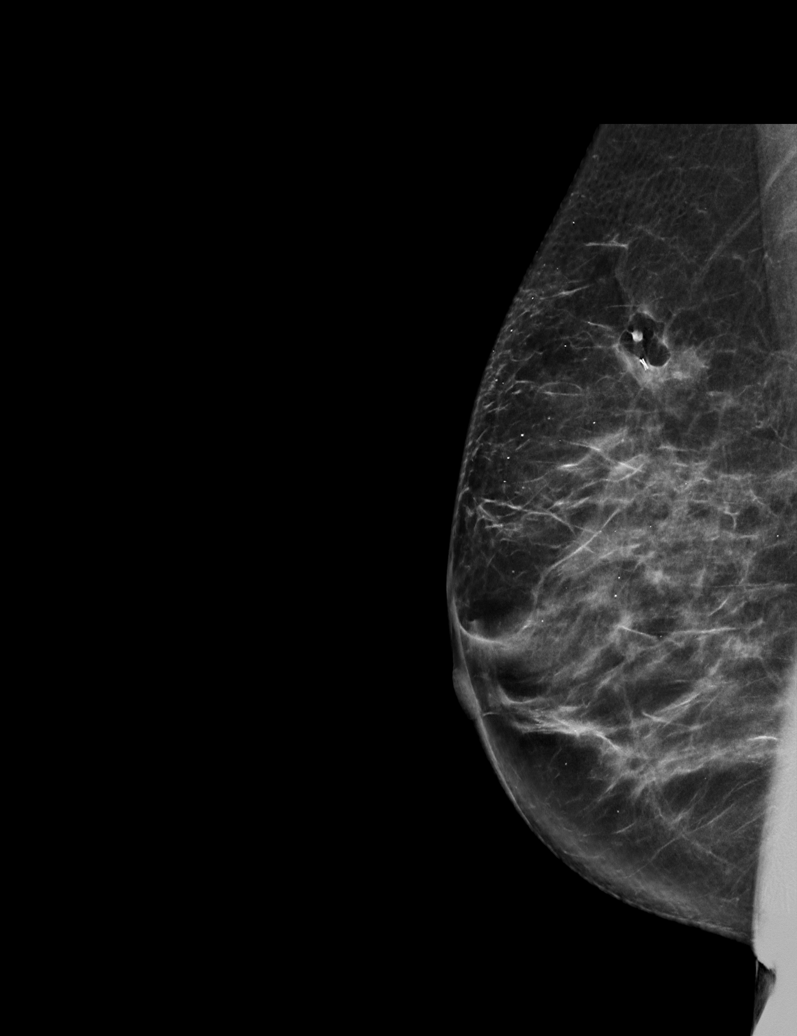

[R CC synth-2D]
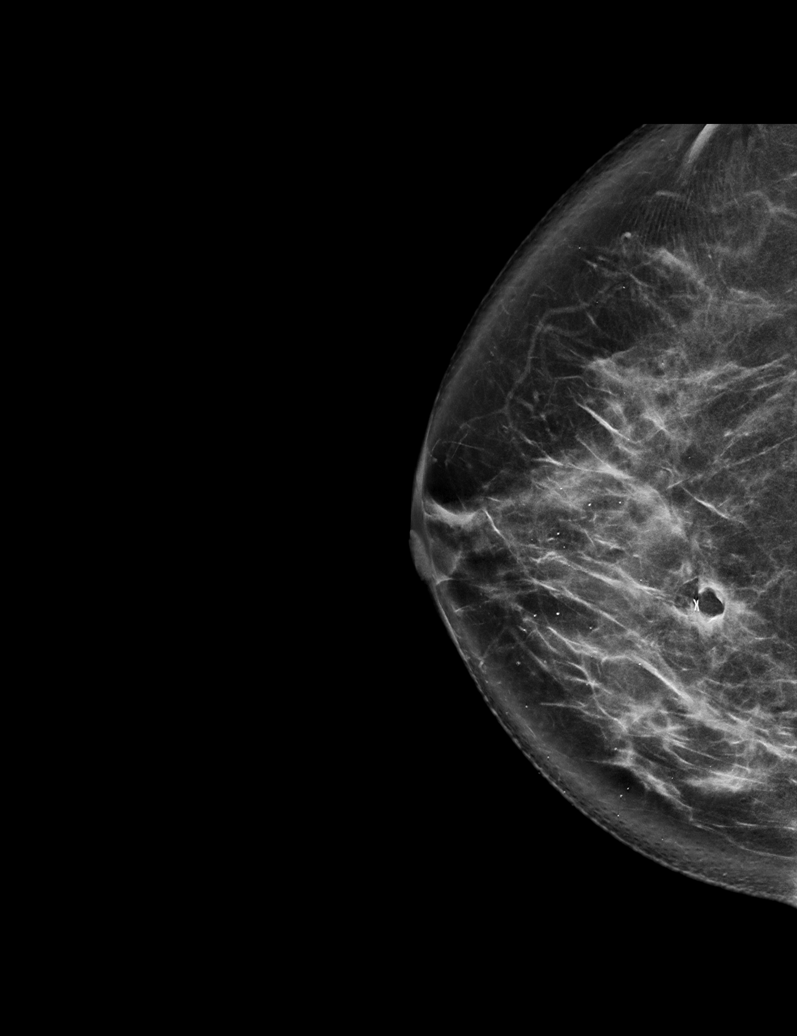

[R ML synth-2D (2 of 2)]
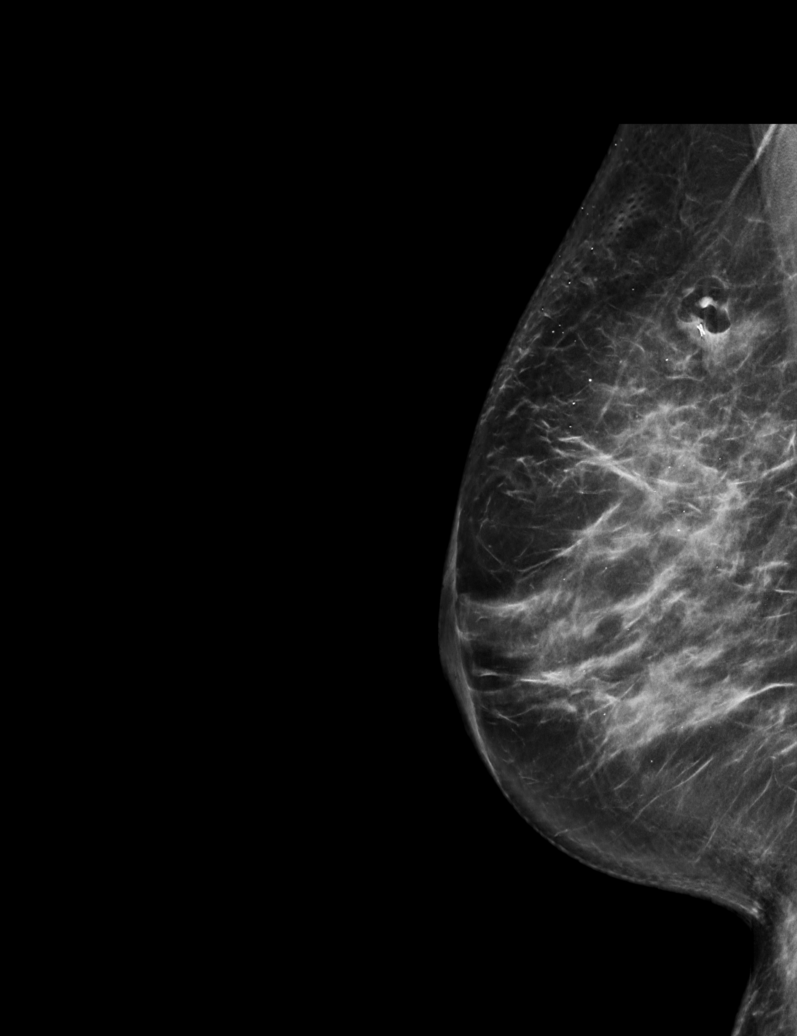

[R ML tomo (1 of 2) · tomo slice 33/66.0]
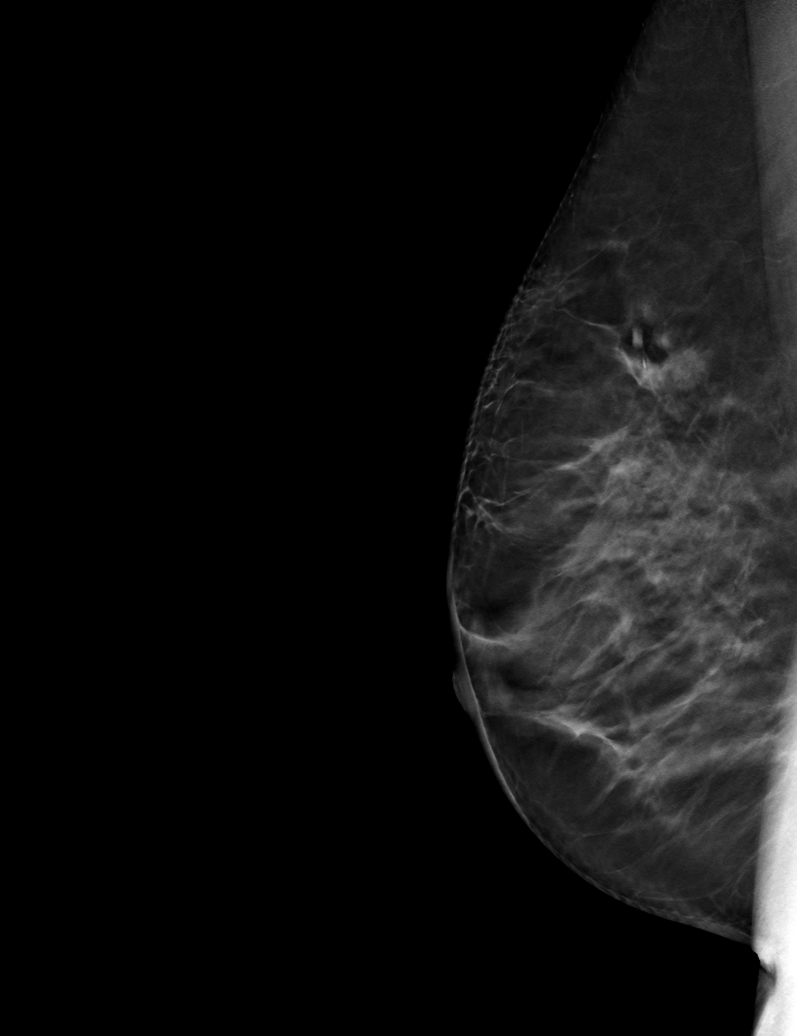

[R CC tomo · tomo slice 38/75.0]
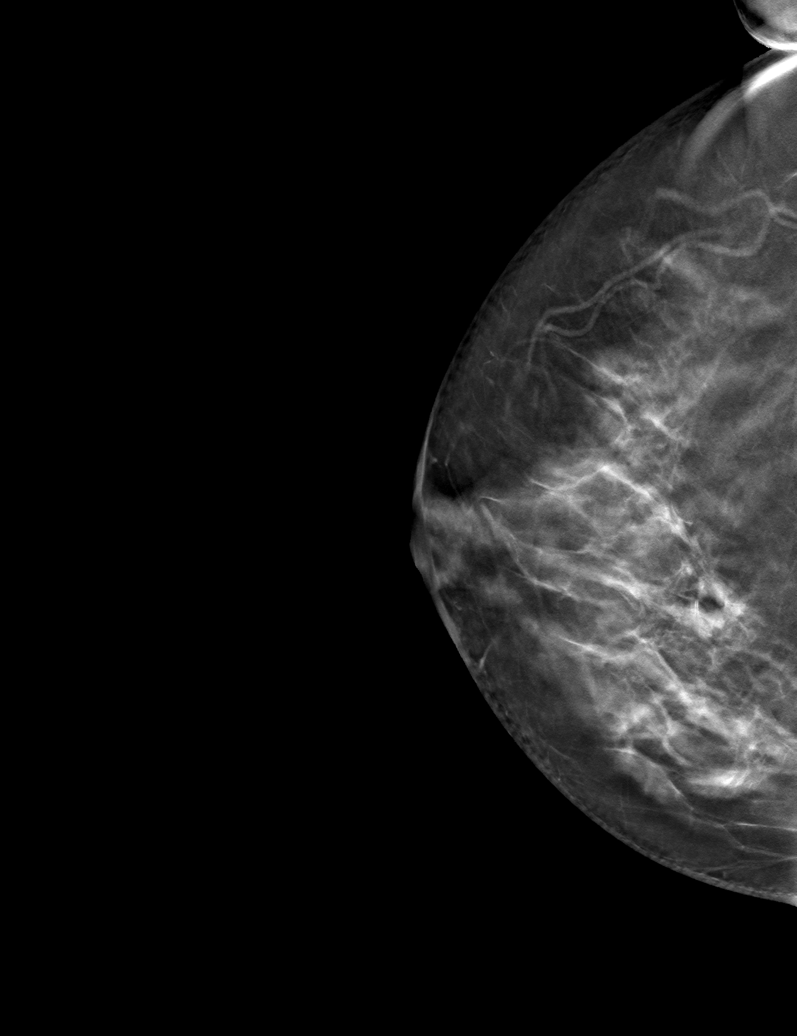

[R ML tomo (2 of 2) · tomo slice 37/74.0]
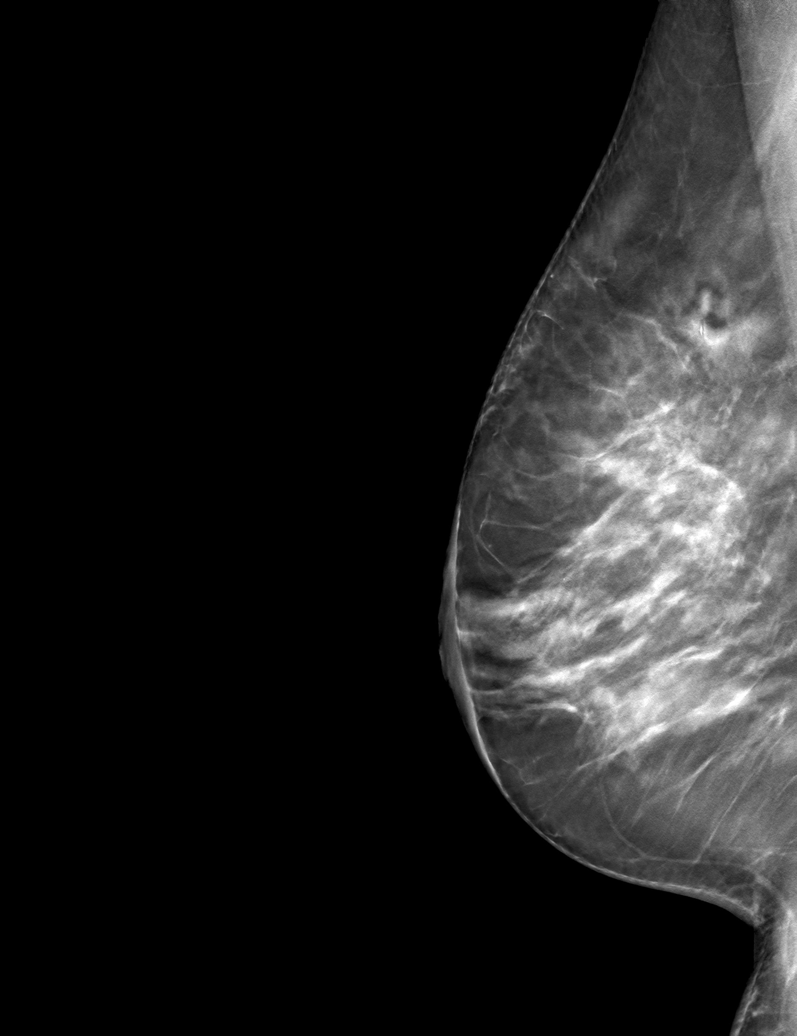

[6 of 18 positions shown; findings below may reference images not displayed]

FINDINGS: 3D Mammographic images were obtained following stereotactic guided
biopsy of right breast distortion. The biopsy marking clip is in
expected position at the site of biopsy.
IMPRESSION: Appropriate positioning of the X shaped biopsy marking clip at the
site of biopsy in the upper inner right breast posterior depth.

Final Assessment: Post Procedure Mammograms for Marker Placement

## 2021-06-05 ENCOUNTER — Other Ambulatory Visit: Payer: Self-pay

## 2021-06-05 ENCOUNTER — Other Ambulatory Visit: Payer: Self-pay | Admitting: Family Medicine

## 2021-06-05 ENCOUNTER — Ambulatory Visit
Admission: RE | Admit: 2021-06-05 | Discharge: 2021-06-05 | Disposition: A | Discharge status: Home / Self Care | Payer: PPO | Source: Ambulatory Visit | Attending: Family Medicine | Admitting: Family Medicine

## 2021-06-05 DIAGNOSIS — C50911 Malignant neoplasm of unspecified site of right female breast: Secondary | ICD-10-CM | POA: Diagnosis not present

## 2021-06-05 DIAGNOSIS — C50919 Malignant neoplasm of unspecified site of unspecified female breast: Secondary | ICD-10-CM

## 2021-06-05 DIAGNOSIS — R590 Localized enlarged lymph nodes: Secondary | ICD-10-CM | POA: Diagnosis not present

## 2021-06-05 IMAGING — US US BREAST*R* LIMITED INC AXILLA
1 series · 5 of 5 positions shown · non-contrast
Comparison: Previous exam(s).

CLINICAL DATA: Invasive mammary carcinoma and mammary carcinoma in
situ on a recent stereotactic guided core needle biopsy. The current
examination is to evaluate the right axilla for adenopathy.

EXAM:
ULTRASOUND OF THE RIGHT AXILLA

[Series 1: us breast*right* limited inc axilla · 0.09mm/px · 5 of 5 slices shown]
[im 1/5]
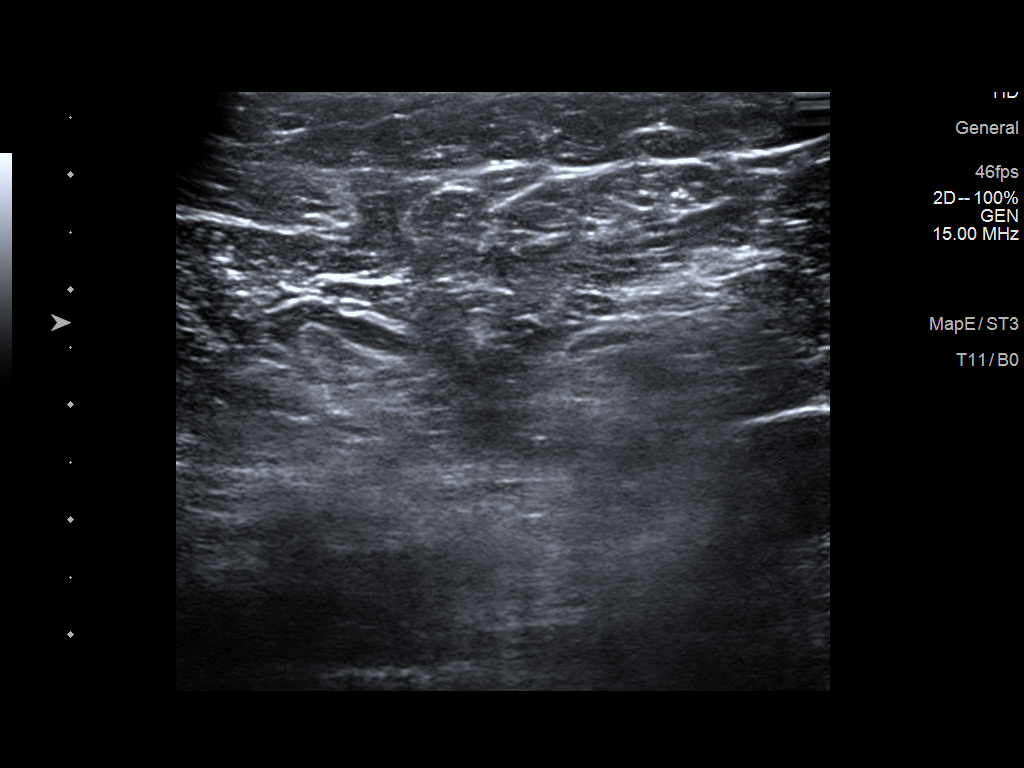
[im 2/5]
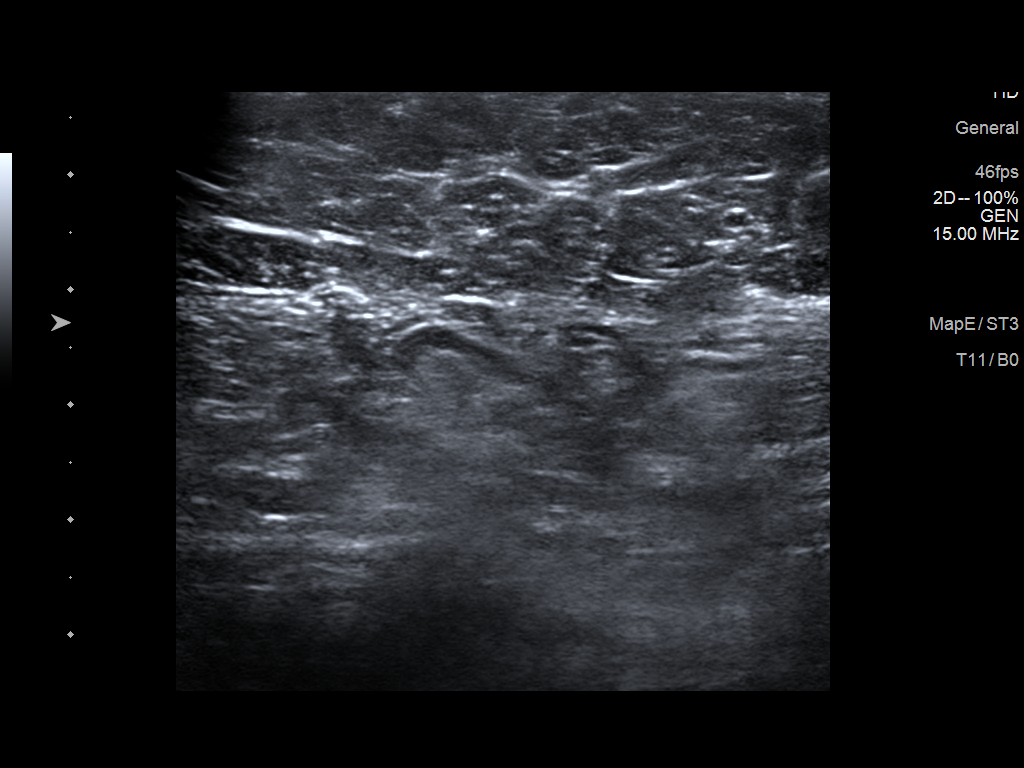
[im 3/5]
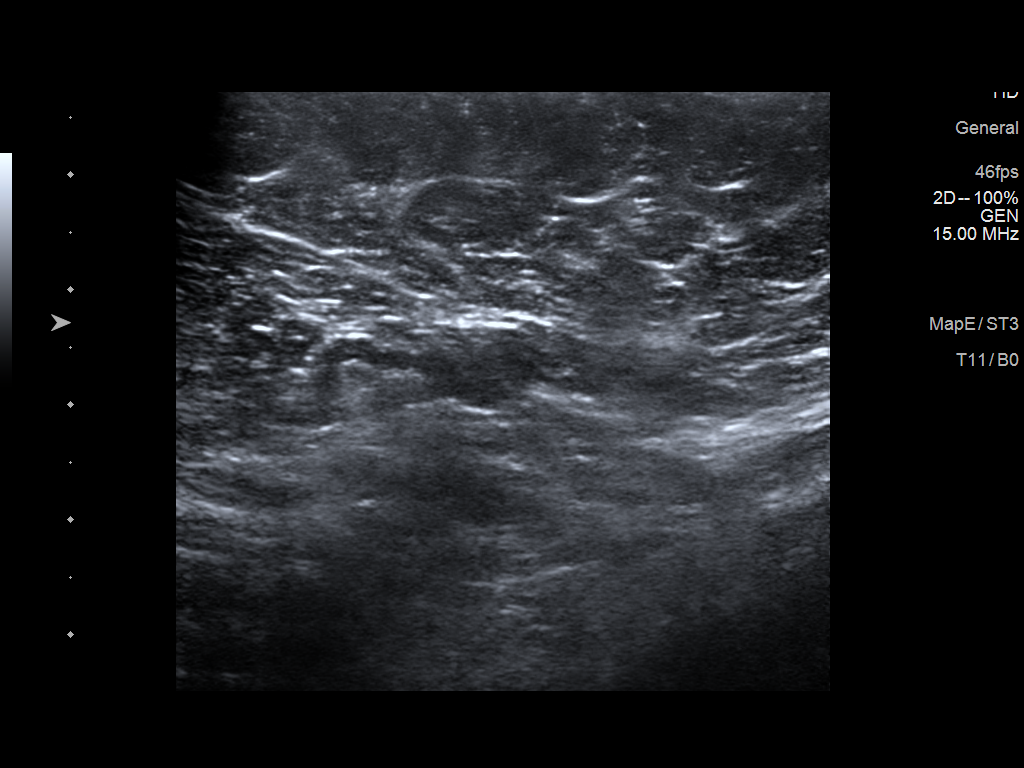
[im 4/5]
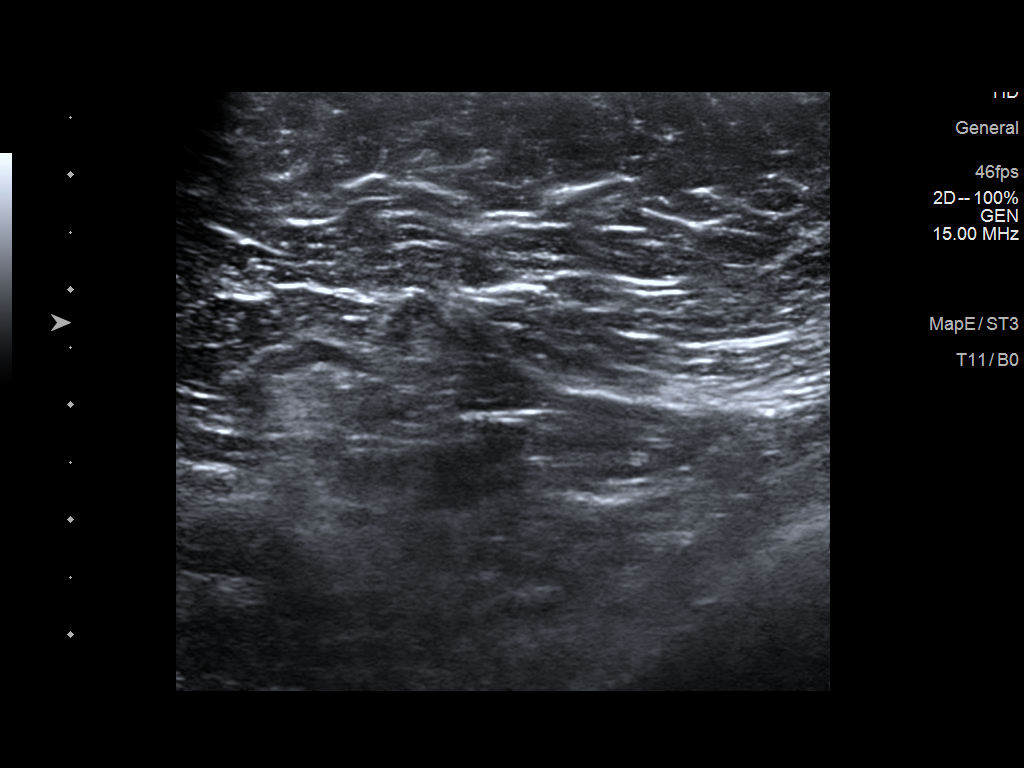
[im 5/5]
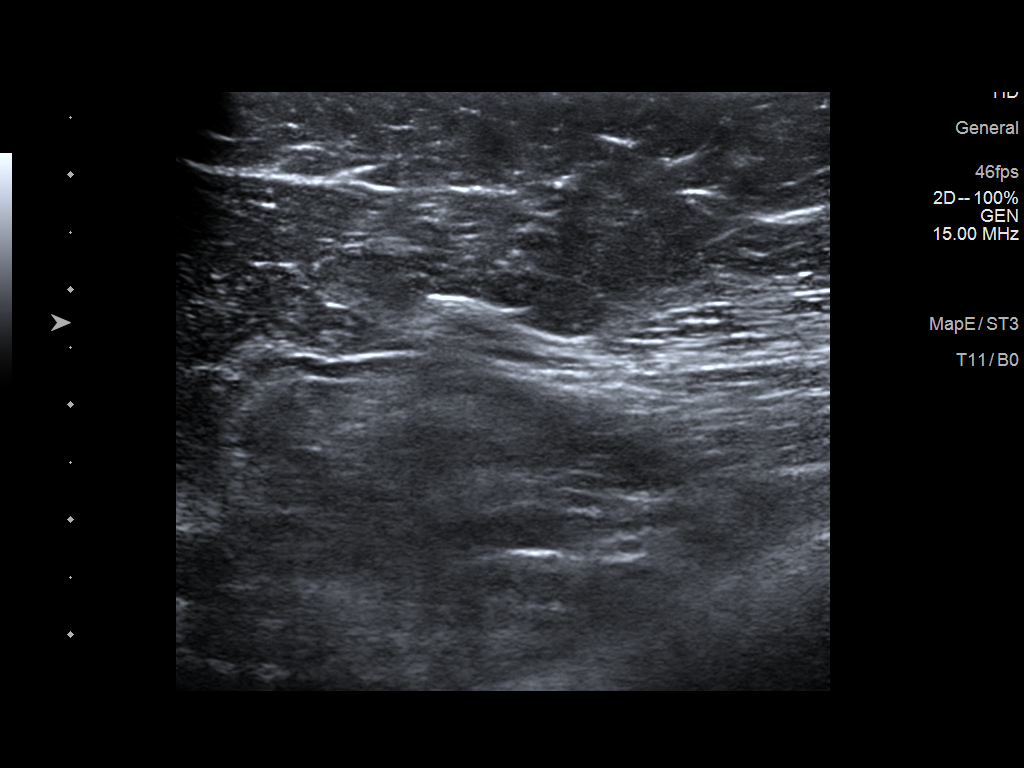

[5 of 5 positions shown; findings below may reference images not displayed]

FINDINGS: On physical exam, there are no palpable right axillary lymph nodes.
The patient has symmetrical dark red skin discoloration in both
axillae that started at the same time recently. She reports that
this has happened in the past and was less prominent in the past and
resolved on its own in the past.

Targeted ultrasound is performed, showing normal appearing right
axillary lymph nodes.
IMPRESSION: Normal right axillary lymph nodes. No evidence of metastatic
adenopathy.

RECOMMENDATION:
Treatment plan.

I have discussed the findings and recommendations with the patient.
If applicable, a reminder letter will be sent to the patient
regarding the next appointment.

BI-RADS CATEGORY  1: Negative.

## 2021-06-06 DIAGNOSIS — Z170 Estrogen receptor positive status [ER+]: Secondary | ICD-10-CM | POA: Diagnosis not present

## 2021-06-06 DIAGNOSIS — C50411 Malignant neoplasm of upper-outer quadrant of right female breast: Secondary | ICD-10-CM | POA: Diagnosis not present

## 2021-06-08 ENCOUNTER — Other Ambulatory Visit: Payer: Self-pay | Admitting: General Surgery

## 2021-06-08 DIAGNOSIS — C50411 Malignant neoplasm of upper-outer quadrant of right female breast: Secondary | ICD-10-CM

## 2021-06-09 ENCOUNTER — Other Ambulatory Visit (INDEPENDENT_AMBULATORY_CARE_PROVIDER_SITE_OTHER): Payer: PPO

## 2021-06-09 ENCOUNTER — Other Ambulatory Visit: Payer: Self-pay

## 2021-06-09 DIAGNOSIS — E1121 Type 2 diabetes mellitus with diabetic nephropathy: Secondary | ICD-10-CM

## 2021-06-09 LAB — COMPREHENSIVE METABOLIC PANEL WITH GFR
ALT: 22 U/L (ref 0–35)
AST: 23 U/L (ref 0–37)
Albumin: 4.8 g/dL (ref 3.5–5.2)
Alkaline Phosphatase: 48 U/L (ref 39–117)
BUN: 29 mg/dL — ABNORMAL HIGH (ref 6–23)
CO2: 24 meq/L (ref 19–32)
Calcium: 10.3 mg/dL (ref 8.4–10.5)
Chloride: 105 meq/L (ref 96–112)
Creatinine, Ser: 1.58 mg/dL — ABNORMAL HIGH (ref 0.40–1.20)
GFR: 32.56 mL/min — ABNORMAL LOW
Glucose, Bld: 125 mg/dL — ABNORMAL HIGH (ref 70–99)
Potassium: 4.7 meq/L (ref 3.5–5.1)
Sodium: 138 meq/L (ref 135–145)
Total Bilirubin: 0.4 mg/dL (ref 0.2–1.2)
Total Protein: 7.3 g/dL (ref 6.0–8.3)

## 2021-06-09 LAB — HEMOGLOBIN A1C: Hgb A1c MFr Bld: 6.4 % (ref 4.6–6.5)

## 2021-06-09 LAB — LIPID PANEL
Cholesterol: 153 mg/dL (ref 0–200)
HDL: 27.8 mg/dL — ABNORMAL LOW
NonHDL: 124.81
Total CHOL/HDL Ratio: 5
Triglycerides: 267 mg/dL — ABNORMAL HIGH (ref 0.0–149.0)
VLDL: 53.4 mg/dL — ABNORMAL HIGH (ref 0.0–40.0)

## 2021-06-09 LAB — LDL CHOLESTEROL, DIRECT: Direct LDL: 89 mg/dL

## 2021-06-09 NOTE — Progress Notes (Signed)
No critical labs need to be addressed urgently. We will discuss labs in detail at upcoming office visit.   

## 2021-06-15 ENCOUNTER — Inpatient Hospital Stay: Payer: PPO

## 2021-06-15 ENCOUNTER — Other Ambulatory Visit: Payer: Self-pay

## 2021-06-15 ENCOUNTER — Encounter: Payer: Self-pay | Admitting: *Deleted

## 2021-06-15 ENCOUNTER — Ambulatory Visit (INDEPENDENT_AMBULATORY_CARE_PROVIDER_SITE_OTHER): Payer: PPO | Admitting: Family Medicine

## 2021-06-15 ENCOUNTER — Inpatient Hospital Stay: Payer: PPO | Attending: Internal Medicine | Admitting: Internal Medicine

## 2021-06-15 ENCOUNTER — Encounter: Payer: Self-pay | Admitting: Internal Medicine

## 2021-06-15 VITALS — BP 140/78 | HR 67 | Temp 97.9°F | Wt 148.8 lb

## 2021-06-15 DIAGNOSIS — E11319 Type 2 diabetes mellitus with unspecified diabetic retinopathy without macular edema: Secondary | ICD-10-CM | POA: Diagnosis not present

## 2021-06-15 DIAGNOSIS — Z7982 Long term (current) use of aspirin: Secondary | ICD-10-CM | POA: Diagnosis not present

## 2021-06-15 DIAGNOSIS — E1122 Type 2 diabetes mellitus with diabetic chronic kidney disease: Secondary | ICD-10-CM | POA: Diagnosis not present

## 2021-06-15 DIAGNOSIS — F17210 Nicotine dependence, cigarettes, uncomplicated: Secondary | ICD-10-CM | POA: Insufficient documentation

## 2021-06-15 DIAGNOSIS — Z801 Family history of malignant neoplasm of trachea, bronchus and lung: Secondary | ICD-10-CM | POA: Insufficient documentation

## 2021-06-15 DIAGNOSIS — Z86010 Personal history of colonic polyps: Secondary | ICD-10-CM | POA: Diagnosis not present

## 2021-06-15 DIAGNOSIS — M810 Age-related osteoporosis without current pathological fracture: Secondary | ICD-10-CM | POA: Insufficient documentation

## 2021-06-15 DIAGNOSIS — G44229 Chronic tension-type headache, not intractable: Secondary | ICD-10-CM | POA: Diagnosis not present

## 2021-06-15 DIAGNOSIS — N390 Urinary tract infection, site not specified: Secondary | ICD-10-CM | POA: Diagnosis not present

## 2021-06-15 DIAGNOSIS — F321 Major depressive disorder, single episode, moderate: Secondary | ICD-10-CM | POA: Diagnosis not present

## 2021-06-15 DIAGNOSIS — Z90710 Acquired absence of both cervix and uterus: Secondary | ICD-10-CM | POA: Diagnosis not present

## 2021-06-15 DIAGNOSIS — Z803 Family history of malignant neoplasm of breast: Secondary | ICD-10-CM | POA: Insufficient documentation

## 2021-06-15 DIAGNOSIS — Z23 Encounter for immunization: Secondary | ICD-10-CM | POA: Diagnosis not present

## 2021-06-15 DIAGNOSIS — E785 Hyperlipidemia, unspecified: Secondary | ICD-10-CM | POA: Diagnosis not present

## 2021-06-15 DIAGNOSIS — Z79899 Other long term (current) drug therapy: Secondary | ICD-10-CM | POA: Diagnosis not present

## 2021-06-15 DIAGNOSIS — N189 Chronic kidney disease, unspecified: Secondary | ICD-10-CM | POA: Diagnosis not present

## 2021-06-15 DIAGNOSIS — I129 Hypertensive chronic kidney disease with stage 1 through stage 4 chronic kidney disease, or unspecified chronic kidney disease: Secondary | ICD-10-CM | POA: Insufficient documentation

## 2021-06-15 DIAGNOSIS — Z90722 Acquired absence of ovaries, bilateral: Secondary | ICD-10-CM | POA: Diagnosis not present

## 2021-06-15 DIAGNOSIS — Z170 Estrogen receptor positive status [ER+]: Secondary | ICD-10-CM | POA: Diagnosis not present

## 2021-06-15 DIAGNOSIS — E079 Disorder of thyroid, unspecified: Secondary | ICD-10-CM | POA: Insufficient documentation

## 2021-06-15 DIAGNOSIS — C50411 Malignant neoplasm of upper-outer quadrant of right female breast: Secondary | ICD-10-CM | POA: Diagnosis not present

## 2021-06-15 DIAGNOSIS — E1121 Type 2 diabetes mellitus with diabetic nephropathy: Secondary | ICD-10-CM

## 2021-06-15 DIAGNOSIS — K219 Gastro-esophageal reflux disease without esophagitis: Secondary | ICD-10-CM | POA: Diagnosis not present

## 2021-06-15 DIAGNOSIS — C50211 Malignant neoplasm of upper-inner quadrant of right female breast: Secondary | ICD-10-CM

## 2021-06-15 LAB — HM DIABETES FOOT EXAM

## 2021-06-15 MED ORDER — TRAMADOL HCL 50 MG PO TABS: 50.0000 mg | ORAL_TABLET | Freq: Three times a day (TID) | ORAL | 0 refills | Status: AC | PRN

## 2021-06-15 NOTE — Assessment & Plan Note (Addendum)
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  Awaiting breast MRI on 09/ 12.  # I had a long discussion with the patient in general regarding the treatment options of breast cancer including-surgery; adjuvant radiation; role of adjuvant systemic therapy including-chemotherapy antihormone therapy.   # Patient will likely need lumpectomy with sentinel lymph node evaluation; followed by radiation.   # Decision regarding chemotherapy based on final surgical pathology/gene assay.  Clinically based on current data available patient unlikely to receive chemotherapy.  Patient will benefit from antihormone therapy.  Clinically I discussed the potential benefits of each option; and also potential downsides in detail.   #Genetics: Discuss.  Patient has no siblings.  No biological children; adopted daughter.  Thank you Dr.Toth for allowing me to participate in the care of your pleasant patient. Please do not hesitate to contact me with questions or concerns in the interim.  # DISPOSITION: Patient was asked to call us back for an appointment with 2 to 3 weeks post surgery.   # no labs # follow up TBD-Dr.B

## 2021-06-15 NOTE — Patient Instructions (Signed)
#  Please call to make an appointment to follow-up with [place] 2 to 3 weeks postsurgery.

## 2021-06-15 NOTE — Assessment & Plan Note (Addendum)
Chronic,  Good control  Lab Results  Component Value Date   HGBA1C 6.4 06/09/2021   Stop Trulcity given cost issues.  Continue the lifestyle changes as well as glyburide and metformin.  Make sure keeping up with lean protein.  Call if fasting blood sugars > 150.

## 2021-06-15 NOTE — Progress Notes (Signed)
Patient ID: April Ross, female    DOB: 02-Sep-1951, 70 y.o.   MRN: [id number]  This visit was conducted in person.  BP 140/78   Pulse 67   Temp 97.9 F (36.6 C) (Temporal)   Wt 148 lb 12 oz (67.5 kg)   SpO2 98%   BMI 27.21 kg/m    CC: Chief Complaint  Patient presents with   Follow-up    3 month DM  Wants to stop trulicity due to cost now that [place] the [place]    Headache    Would like Tramadol PRN for her headaches. States that's the only thing that helps    Subjective:   HPI: April Ross is a 70 y.o. female presenting on 06/15/2021 for Follow-up (3 month DM /Wants to stop trulicity due to cost now that she is in the [place]ut [place]/) and Headache (Would like Tramadol PRN for her headaches. States that's the only thing that helps)  Recent new Dx of  right breast cancer.  Reviewed OV note by dr. Marlou Starks on 06/06/2021 she was found to have a 1 cm area of distortion in the upper portion of the right breast with clinically normal looking lymph nodes. 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 The HER2 has not been reported yet. Her only family history of cancer is lung cancer in a maternal aunt. She does smoke about a half a pack of cigarettes a day.  Appears she is undecided btw lumpectomy vs mastectomy.  Diabetes:  A1C now at goal down from 13.6 to 8.3 and now6.4 Lab Results  Component Value Date   HGBA1C 6.4 38/93/7342   ON Trulicity low dose ( cannot afford now in [place]) glyburide 5 mg BID and metformin  500 mg BID. Using medications without difficulties: Hypoglycemic episodes:  40 and 47 after early supper, no snack Hyperglycemic episodes: none Feet problems: no ulcers Blood Sugars averaging: FBS  92-108 eye exam within last year: 09/2020 DR   She has given up rice , past, sour dough bread, no chips, stopped lattes.  Has lost 7 lbs... no appetite lately. Wt Readings from Last 3  Encounters:  06/15/21 148 lb 12 oz (67.5 kg)  03/14/21 155 lb 4 oz (70.4 kg)  02/24/21 156 lb (70.8 kg)     Headache, off an on.. in past has used tramadol for pain  Ibuprofen and tylenol cause SE.  She would like a refill ( has 1 tablet left from 24 months ago     Relevant past medical, surgical, family and social history reviewed and updated as indicated. Interim medical history since our last visit reviewed. Allergies and medications reviewed and updated. Outpatient Medications Prior to Visit  Medication Sig Dispense Refill   alendronate (FOSAMAX) 70 MG tablet TAKE 1 TABLET BY MOUTH EVERY 7 DAYS WITH A FULL GLASS OF WATER ON AN EMPTY STOMACH 12 tablet 3   amLODipine (NORVASC) 5 MG tablet Take 1 tablet (5 mg total) by mouth daily. 90 tablet 11   aspirin EC 81 MG tablet Take 81 mg by mouth daily. Swallow whole.     cetirizine (ZYRTEC) 10 MG tablet Take 10 mg by mouth at bedtime as needed for allergies.     CINNAMON PO Take 1 tablet by mouth daily.     Dulaglutide (TRULICITY) 8.76 OT/1.5BW SOPN Inject 0.75 mg into the skin once a  week. 9 mL 3   fluticasone (FLONASE) 50 MCG/ACT nasal spray Place 2 sprays into both nostrils daily as needed for allergies.     glyBURIDE (DIABETA) 5 MG tablet Take 1 tablet (5 mg total) by mouth 2 (two) times daily with a meal. 60 tablet 5   levothyroxine (SYNTHROID) 88 MCG tablet TAKE 1 TABLET BY MOUTH EVERY DAY 90 tablet 3   losartan (COZAAR) 100 MG tablet Take 1 tablet (100 mg total) by mouth daily. 90 tablet 1   metFORMIN (GLUCOPHAGE) 500 MG tablet TAKE 1 TABLET BY MOUTH 2 TIMES DAILY WITH A MEAL. 180 tablet 1   Omega-3 Fatty Acids (FISH OIL OMEGA-3 PO) Take 1 capsule by mouth daily.     omeprazole (PRILOSEC) 20 MG capsule TAKE 1 CAPSULE BY MOUTH EVERY DAY 90 capsule 3   rosuvastatin (CRESTOR) 20 MG tablet TAKE 1 TABLET BY MOUTH EVERY DAY 90 tablet 3   tiZANidine (ZANAFLEX) 4 MG tablet TAKE 1 TABLET BY MOUTH AT BEDTIME AS NEEDED FOR MUSCLE SPASMS. 90  tablet 0   Turmeric (QC TUMERIC COMPLEX PO) Take 1 tablet by mouth daily.     VITAMIN D PO Take 2 each by mouth daily.     No facility-administered medications prior to visit.     Per HPI unless specifically indicated in ROS section below Review of Systems  Constitutional:  Negative for fatigue and fever.  HENT:  Negative for ear pain.   Eyes:  Negative for pain.  Respiratory:  Negative for chest tightness and shortness of breath.   Cardiovascular:  Negative for chest pain, palpitations and leg swelling.  Gastrointestinal:  Negative for abdominal pain.  Genitourinary:  Negative for dysuria.  Objective:  BP 140/78   Pulse 67   Temp 97.9 F (36.6 C) (Temporal)   Wt 148 lb 12 oz (67.5 kg)   SpO2 98%   BMI 27.21 kg/m   Wt Readings from Last 3 Encounters:  06/15/21 148 lb 12 oz (67.5 kg)  03/14/21 155 lb 4 oz (70.4 kg)  02/24/21 156 lb (70.8 kg)      Physical Exam Constitutional:      General: She is not in acute distress.    Appearance: Normal appearance. She is well-developed. She is not ill-appearing or toxic-appearing.  HENT:     Head: Normocephalic.     Right Ear: Hearing, tympanic membrane, ear canal and external ear normal. Tympanic membrane is not erythematous, retracted or bulging.     Left Ear: Hearing, tympanic membrane, ear canal and external ear normal. Tympanic membrane is not erythematous, retracted or bulging.     Nose: No mucosal edema or rhinorrhea.     Right Sinus: No maxillary sinus tenderness or frontal sinus tenderness.     Left Sinus: No maxillary sinus tenderness or frontal sinus tenderness.     Mouth/Throat:     Pharynx: Uvula midline.  Eyes:     General: Lids are normal. Lids are everted, no foreign bodies appreciated.     Conjunctiva/sclera: Conjunctivae normal.     Pupils: Pupils are equal, round, and reactive to light.  Neck:     Thyroid: No thyroid mass or thyromegaly.     Vascular: No carotid bruit.     Trachea: Trachea normal.   Cardiovascular:     Rate and Rhythm: Normal rate and regular rhythm.     Pulses: Normal pulses.     Heart sounds: Normal heart sounds, S1 normal and S2 normal. No murmur heard.  No friction rub. No gallop.  Pulmonary:     Effort: Pulmonary effort is normal. No tachypnea or respiratory distress.     Breath sounds: Normal breath sounds. No decreased breath sounds, wheezing, rhonchi or rales.  Abdominal:     General: Bowel sounds are normal.     Palpations: Abdomen is soft.     Tenderness: There is no abdominal tenderness.  Musculoskeletal:     Cervical back: Normal range of motion and neck supple.  Skin:    General: Skin is warm and dry.     Findings: No Salaz.  Neurological:     Mental Status: She is alert.  Psychiatric:        Mood and Affect: Mood is not anxious or depressed.        Speech: Speech normal.        Behavior: Behavior normal. Behavior is cooperative.        Thought Content: Thought content normal.        Judgment: Judgment normal.      Results for orders placed or performed in visit on 06/09/21  Comprehensive metabolic panel  Result Value Ref Range   Sodium 138 135 - 145 mEq/L   Potassium 4.7 3.5 - 5.1 mEq/L   Chloride 105 96 - 112 mEq/L   CO2 24 19 - 32 mEq/L   Glucose, Bld 125 (H) 70 - 99 mg/dL   BUN 29 (H) 6 - 23 mg/dL   Creatinine, Ser 1.58 (H) 0.40 - 1.20 mg/dL   Total Bilirubin 0.4 0.2 - 1.2 mg/dL   Alkaline Phosphatase 48 39 - 117 U/L   AST 23 0 - 37 U/L   ALT 22 0 - 35 U/L   Total Protein 7.3 6.0 - 8.3 g/dL   Albumin 4.8 3.5 - 5.2 g/dL   GFR 32.56 (L) >60.00 mL/min   Calcium 10.3 8.4 - 10.5 mg/dL  Lipid panel  Result Value Ref Range   Cholesterol 153 0 - 200 mg/dL   Triglycerides 267.0 (H) 0.0 - 149.0 mg/dL   HDL 27.80 (L) >39.00 mg/dL   VLDL 53.4 (H) 0.0 - 40.0 mg/dL   Total CHOL/HDL Ratio 5    NonHDL 124.81   Hemoglobin A1c  Result Value Ref Range   Hgb A1c MFr Bld 6.4 4.6 - 6.5 %  LDL cholesterol, direct  Result Value Ref Range    Direct LDL 89.0 mg/dL    This visit occurred during the SARS-CoV-2 public health emergency.  Safety protocols were in place, including screening questions prior to the visit, additional usage of staff PPE, and extensive cleaning of exam room while observing appropriate contact time as indicated for disinfecting solutions.   COVID 19 screen:  No recent travel or known exposure to COVID19 The patient denies respiratory symptoms of COVID 19 at this time. The importance of social distancing was discussed today.   Assessment and Plan Problem List Items Addressed This Visit     Chronic tension-type headache, not intractable    Worsened, chronic  Using tramadol prn headache. Discussed limiting medication.  Stress reduction, relaxation, massage, keep up with fluids.       Diabetic retinopathy ([place])    Followed by opthamology.      Moderate major depression, single episode (HCC)    Moderate control, worsened with dx of breast cancer  Not interested in medication at this time.      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  Continue the lifestyle changes as well as glyburide and metformin.  Make sure keeping up with lean protein.  Call if fasting blood sugars > 150.       Other Visit Diagnoses     Need for influenza vaccination       Relevant Orders   Flu Vaccine QUAD High Dose(Fluad) (Completed)      Meds ordered this encounter  Medications   traMADol (ULTRAM) 50 MG tablet    Sig: Take 1 tablet (50 mg total) by mouth every 8 (eight) hours as needed for up to 5 days.    Dispense:  20 tablet    Refill:  0       Eliezer Lofts, MD

## 2021-06-15 NOTE — Progress Notes (Signed)
Informed by Eric Form, RN of patient's new diagnosis of invasive lobular carcinoma.  Called patient to establish navigation services.  She is scheduled for a breast MRI on 06/19/21.  Surgery date is pending.  She is to call me or the office back with her surgery date and we can arrange a follow up appointment with Dr. Rogue Bussing at that time.  Offered Neurosurgeon, but patient declined at this time.  She was encouraged to call with any questions or needs.

## 2021-06-15 NOTE — Patient Instructions (Addendum)
Stop Trulcity given cost issues.  Continue the lifestyle changes as well as glyburide and metformin.  Make sure keeping up with lean protein.  Call if fasting blood sugars > 150.  Call if mood not doing well.  Stop fosamax given completed 4 year course.

## 2021-06-15 NOTE — Progress Notes (Signed)
[facility] NOTE  Patient Care Team: Jinny Sanders, MD as PCP - General (Family Medicine) Richard Miu, DMD as Consulting Physician (Dentistry)  CHIEF COMPLAINTS/PURPOSE OF CONSULTATION: Breast cancer  # Targeted ultrasound is performed by the physician from the 10 o'clock to 2 o'clock positions. No sonographic correlate or suspicious solid or cystic mass identified.   IMPRESSION: Suspicious architectural distortion at the right breast 12 o'clock position far posterior depth. Recommend stereotactic guided biopsy.  Breast, right, needle core biopsy, upper inner -LOBULAR  INVASIVE MAMMARY CARCINOMA; - MAMMARY CARCINOMA IN SITU; ER/PR POSITIVE; her- 2 NEGATIVE; ki-67- Oncology History   No history exists.     HISTORY OF PRESENTING ILLNESS:  April Ross 70 y.o.  female female with no prior history of breast cancer/or malignancies has been referred to [place] for further evaluation recommendations for new diagnosis of breast cancer.   Patient states she was found to have an abnormal screening mammogram which led to diagnostic mammogram/ultrasound/followed by biopsy-as summarized above.  Family history of breast cancer: none Family history of other cancers:aunt- lung cancer/no-smoker & aunt -brain tumor [in 1990s-] [ mom side]; dad's side- family Hx unknown Used OCP: yes;   Used estrogen and progesterone therapy: TAH & BSO [endometriosis] 1987  x3 years  History of Radiation to the chest:none  Number of pregnancies: no biological children; adopted daughter.  Previous biopsy: first  Review of Systems  Constitutional:  Negative for chills, diaphoresis, fever, malaise/fatigue and weight loss.  HENT:  Negative for nosebleeds and sore throat.   Eyes:  Negative for double vision.  Respiratory:  Negative for cough, hemoptysis, sputum production, shortness of breath and wheezing.   Cardiovascular:  Negative for chest pain, palpitations, orthopnea and leg swelling.   Gastrointestinal:  Negative for abdominal pain, blood in stool, constipation, diarrhea, heartburn, melena, nausea and vomiting.  Genitourinary:  Negative for dysuria, frequency and urgency.  Musculoskeletal:  Positive for joint pain. Negative for back pain.  Skin: Negative.  Negative for itching and Oestreich.  Neurological:  Negative for dizziness, tingling, focal weakness, weakness and headaches.  Endo/Heme/Allergies:  Does not bruise/bleed easily.  Psychiatric/Behavioral:  Negative for depression. The patient is not nervous/anxious and does not have insomnia.     MEDICAL HISTORY:  Past Medical History:  Diagnosis Date   Allergy    Arthritis    CRD (chronic renal disease)    stage 3   Depression    Diabetes mellitus without complication (HCC)    GERD (gastroesophageal reflux disease)    Hyperlipidemia    Hypertension    LBBB (left bundle branch block)    a. diagnosed in 10/2017 - echo showed a preserved EF of 55-60% and NST was low-risk showing no evidence of ischemia.    Osteoporosis    polyps in colon    Thyroid disease    Urinary tract infection     SURGICAL HISTORY: Past Surgical History:  Procedure Laterality Date   ABDOMINAL HYSTERECTOMY     APPENDECTOMY      SOCIAL HISTORY: Social History   Socioeconomic History   Marital status: Widowed    Spouse name: Not on file   Number of children: Not on file   Years of education: Not on file   Highest education level: Not on file  Occupational History   Not on file  Tobacco Use   Smoking status: Every Day    Packs/day: 0.25    Types: Cigarettes   Smokeless tobacco: Never  Vaping Use  Vaping Use: Never used  Substance and Sexual Activity   Alcohol use: Yes    Alcohol/week: 0.0 standard drinks    Comment: occ glass of wine or mix drink   Drug use: No   Sexual activity: Not Currently  Other Topics Concern   Not on file  Social History Narrative   Moved from [place] 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    Social Determinants of Health   Financial Resource Strain: Not on file  Food Insecurity: Not on file  Transportation Needs: Not on file  Physical Activity: Not on file  Stress: Not on file  Social Connections: Not on file  Intimate Partner Violence: Not on file    FAMILY HISTORY: Family History  Problem Relation Age of Onset   Alzheimer's disease Mother    Hyperlipidemia Mother    Hypertension Mother    Asthma Maternal Grandmother    Diabetes Maternal Grandfather    Alcohol abuse Maternal Grandfather    Hyperlipidemia Maternal Grandfather    Stroke Maternal Grandfather    Hypertension Maternal Grandfather    Diabetes Paternal Grandmother    Alcohol abuse Maternal Uncle    Cholecystitis Maternal Uncle    Arthritis Maternal Uncle     ALLERGIES:  is allergic to acetaminophen, asa [aspirin], ibuprofen, and other.  MEDICATIONS:  Current Outpatient Medications  Medication Sig Dispense Refill   amLODipine (NORVASC) 5 MG tablet Take 1 tablet (5 mg total) by mouth daily. 90 tablet 11   aspirin EC 81 MG tablet Take 81 mg by mouth daily. Swallow whole.     cetirizine (ZYRTEC) 10 MG tablet Take 10 mg by mouth at bedtime as needed for allergies.     CINNAMON PO Take 1 tablet by mouth daily.     fluticasone (FLONASE) 50 MCG/ACT nasal spray Place 2 sprays into both nostrils daily as needed for allergies.     glyBURIDE (DIABETA) 5 MG tablet Take 1 tablet (5 mg total) by mouth 2 (two) times daily with a meal. 60 tablet 5   levothyroxine (SYNTHROID) 88 MCG tablet TAKE 1 TABLET BY MOUTH EVERY DAY 90 tablet 3   losartan (COZAAR) 100 MG tablet Take 1 tablet (100 mg total) by mouth daily. 90 tablet 1   metFORMIN (GLUCOPHAGE) 500 MG tablet TAKE 1 TABLET BY MOUTH 2 TIMES DAILY WITH A MEAL. 180 tablet 1   Omega-3 Fatty Acids (FISH OIL OMEGA-3 PO) Take 1 capsule by mouth daily.     omeprazole (PRILOSEC) 20 MG capsule TAKE 1 CAPSULE BY  MOUTH EVERY DAY 90 capsule 3   rosuvastatin (CRESTOR) 20 MG tablet TAKE 1 TABLET BY MOUTH EVERY DAY 90 tablet 3   tiZANidine (ZANAFLEX) 4 MG tablet TAKE 1 TABLET BY MOUTH AT BEDTIME AS NEEDED FOR MUSCLE SPASMS. 90 tablet 0   traMADol (ULTRAM) 50 MG tablet Take 1 tablet (50 mg total) by mouth every 8 (eight) hours as needed for up to 5 days. 20 tablet 0   Turmeric (QC TUMERIC COMPLEX PO) Take 1 tablet by mouth daily.     VITAMIN D PO Take 2 each by mouth daily.     No current facility-administered medications for this visit.      Marland Kitchen  PHYSICAL EXAMINATION: ECOG PERFORMANCE STATUS: 0 - Asymptomatic  Vitals:   06/15/21 1117  BP: (!) 140/55  Pulse: 69  Resp: 20  Temp: 98 F (36.7 C)  SpO2: 100%   Filed Weights   06/15/21 1117  Weight: 148 lb 9.6 oz (67.4 kg)    Physical Exam Vitals and nursing note reviewed.  HENT:     Head: Normocephalic and atraumatic.     Mouth/Throat:     Pharynx: Oropharynx is clear.  Eyes:     Extraocular Movements: Extraocular movements intact.     Pupils: Pupils are equal, round, and reactive to light.  Cardiovascular:     Rate and Rhythm: Normal rate and regular rhythm.  Pulmonary:     Effort: Pulmonary effort is normal.     Breath sounds: Normal breath sounds.  Abdominal:     Palpations: Abdomen is soft.  Musculoskeletal:        General: Normal range of motion.     Cervical back: Normal range of motion.  Skin:    General: Skin is warm.     Comments: Approximately 1 cm vague right upper inner quadrant mass noted.  No nipple changes.  Examination presence of chaperone.  Neurological:     General: No focal deficit present.     Mental Status: She is alert and oriented to person, place, and time.  Psychiatric:        Behavior: Behavior normal.        Judgment: Judgment normal.     LABORATORY DATA:  I have reviewed the data as listed Lab Results  Component Value Date   WBC 8.5 12/01/2020   HGB 12.8 12/01/2020   HCT 38.2 12/01/2020    MCV 83.6 12/01/2020   PLT 240 12/01/2020   Recent Labs    09/26/20 0822 09/28/20 2031 11/18/20 0813 12/01/20 1835 02/17/21 0741 06/09/21 0803  NA 136 137   < > 131* 139 138  K 5.1 4.0   < > 4.3 4.5 4.7  CL 104 102   < > 98 102 105  CO2 24 24   < > _0 GLUCOSE 251* 207*   < > 367* 205* 125*  BUN 24* 25*   < > 26* 31* 29*  CREATININE 1.36* 1.42*   < > 1.41* 1.63* 1.58*  CALCIUM 9.5 9.7   < > 9.8 10.6* 10.3  GFRNONAA 42* 40*  --  40*  --   --   PROT 7.0 6.9   < > 7.2 7.6 7.3  ALBUMIN 4.1 4.1   < > 4.3 4.9 4.8  AST 75* 55*   < > 66* 34 23  ALT 43 37   < > 39 32 22  ALKPHOS 62 66   < > 61 64 48  BILITOT 0.6 0.4   < > 0.3 0.4 0.4   < > = values in this interval not displayed.    RADIOGRAPHIC STUDIES: I have personally reviewed the radiological images as listed and agreed with the findings in the report. US BREAST LTD UNI RIGHT INC AXILLA  Result Date: 06/05/2021 CLINICAL DATA:  Invasive mammary carcinoma and mammary carcinoma in situ on a recent stereotactic guided core needle biopsy. The current examination is to evaluate the right axilla for adenopathy. EXAM: ULTRASOUND OF THE RIGHT AXILLA COMPARISON:  Previous exam(s). FINDINGS: On physical exam, there are no palpable right axillary lymph nodes. The patient has symmetrical dark red skin discoloration in both axillae that started at the same time recently. She reports that this has happened in the past and was less prominent in the past and resolved on its own in the past. Targeted ultrasound is performed, showing normal appearing right axillary lymph nodes. IMPRESSION: Normal right axillary  lymph nodes. No evidence of metastatic adenopathy. RECOMMENDATION: Treatment plan. I have discussed the findings and recommendations with the patient. If applicable, a reminder letter will be sent to the patient regarding the next appointment. BI-RADS CATEGORY  1: Negative. Electronically Signed   By: Claudie Revering M.D.   On: 06/05/2021  12:55  US BREAST LTD UNI RIGHT INC AXILLA  Result Date: 05/22/2021 CLINICAL DATA:  Right breast asymmetry. EXAM: DIGITAL DIAGNOSTIC UNILATERAL RIGHT MAMMOGRAM WITH TOMOSYNTHESIS AND CAD; ULTRASOUND RIGHT BREAST LIMITED TECHNIQUE: Right digital diagnostic mammography and breast tomosynthesis was performed. The images were evaluated with computer-aided detection.; Targeted ultrasound examination of the right breast was performed COMPARISON:  Previous exam(s). ACR Breast Density Category c: The breast tissue is heterogeneously dense, which may obscure small masses. FINDINGS: The previously described asymmetry persists on additional views as an area of architectural distortion with possible associated asymmetry at the 12 o'clock position, far posterior depth. Targeted ultrasound is performed by the physician from the 10 o'clock to 2 o'clock positions. No sonographic correlate or suspicious solid or cystic mass identified. IMPRESSION: Suspicious architectural distortion at the right breast 12 o'clock position far posterior depth. Recommend stereotactic guided biopsy. RECOMMENDATION: Stereotactic guided biopsy of the right breast. I have discussed the findings and recommendations with the patient. If applicable, a reminder letter will be sent to the patient regarding the next appointment. BI-RADS CATEGORY  4: Suspicious. Electronically Signed   By: Ileana Roup M.D.   On: 05/22/2021 09:08  MM DIAG BREAST TOMO UNI RIGHT  Result Date: 05/22/2021 CLINICAL DATA:  Right breast asymmetry. EXAM: DIGITAL DIAGNOSTIC UNILATERAL RIGHT MAMMOGRAM WITH TOMOSYNTHESIS AND CAD; ULTRASOUND RIGHT BREAST LIMITED TECHNIQUE: Right digital diagnostic mammography and breast tomosynthesis was performed. The images were evaluated with computer-aided detection.; Targeted ultrasound examination of the right breast was performed COMPARISON:  Previous exam(s). ACR Breast Density Category c: The breast tissue is heterogeneously dense, which may  obscure small masses. FINDINGS: The previously described asymmetry persists on additional views as an area of architectural distortion with possible associated asymmetry at the 12 o'clock position, far posterior depth. Targeted ultrasound is performed by the physician from the 10 o'clock to 2 o'clock positions. No sonographic correlate or suspicious solid or cystic mass identified. IMPRESSION: Suspicious architectural distortion at the right breast 12 o'clock position far posterior depth. Recommend stereotactic guided biopsy. RECOMMENDATION: Stereotactic guided biopsy of the right breast. I have discussed the findings and recommendations with the patient. If applicable, a reminder letter will be sent to the patient regarding the next appointment. BI-RADS CATEGORY  4: Suspicious. Electronically Signed   By: Ileana Roup M.D.   On: 05/22/2021 09:08  MM CLIP PLACEMENT RIGHT  Result Date: 06/01/2021 CLINICAL DATA:  Patient with right breast distortion. EXAM: 3D DIAGNOSTIC RIGHT MAMMOGRAM POST STEREOTACTIC BIOPSY COMPARISON:  Previous exam(s). FINDINGS: 3D Mammographic images were obtained following stereotactic guided biopsy of right breast distortion. The biopsy marking clip is in expected position at the site of biopsy. IMPRESSION: Appropriate positioning of the X shaped biopsy marking clip at the site of biopsy in the upper inner right breast posterior depth. Final Assessment: Post Procedure Mammograms for Marker Placement Electronically Signed   By: Lovey Newcomer M.D.   On: 06/01/2021 09:31  MM RT BREAST BX W LOC DEV 1ST LESION IMAGE BX SPEC STEREO GUIDE  Addendum Date: 06/06/2021   ADDENDUM REPORT: 06/05/2021 12:32 ADDENDUM: Pathology revealed GRADE II INVASIVE MAMMARY CARCINOMA, MAMMARY CARCINOMA IN SITU of the RIGHT breast, upper inner, X  clip. This was found to be concordant by Dr. Lovey Newcomer. Pathology results were discussed with the patient by telephone. The patient reported doing well after the biopsy  with tenderness at the site. Post biopsy instructions and care were reviewed and questions were answered. The patient was encouraged to call [facility] for any additional concerns. Surgical consultation has been arranged with Dr. Autumn Messing at [facility] on June 06, 2021. The patient was asked to return for a right axillary ultrasound on June 05, 2021. Pathology results reported by Stacie Acres RN on 06/05/2021. Electronically Signed   By: Lovey Newcomer M.D.   On: 06/05/2021 12:32   Result Date: 06/06/2021 CLINICAL DATA:  Patient with right breast distortion. EXAM: RIGHT BREAST STEREOTACTIC CORE NEEDLE BIOPSY COMPARISON:  Previous exams. FINDINGS: The patient and I discussed the procedure of stereotactic-guided biopsy including benefits and alternatives. We discussed the high likelihood of a successful procedure. We discussed the risks of the procedure including infection, bleeding, tissue injury, clip migration, and inadequate sampling. Informed written consent was given. The usual time out protocol was performed immediately prior to the procedure. Using sterile technique and 1% Lidocaine as local anesthetic, under stereotactic guidance, a 9 gauge vacuum assisted device was used to perform core needle biopsy of distortion within the posteromedial right breast using a cranial approach. Lesion quadrant: Upper inner quadrant At the conclusion of the procedure, X shaped tissue marker clip was deployed into the biopsy cavity. Follow-up 2-view mammogram was performed and dictated separately. IMPRESSION: Stereotactic-guided biopsy of right breast distortion. No apparent complications. Electronically Signed: By: Lovey Newcomer M.D. On: 06/01/2021 09:28   ASSESSMENT & PLAN:   Carcinoma of upper-inner quadrant of right breast in female, estrogen receptor positive ([place]) #Clinical early stage breast cancer [TxN0]-lobular carcinoma s/p biopsy.  Awaiting breast MRI on 09/ 12.  # I  had a long discussion with the patient in general regarding the treatment options of breast cancer including-surgery; adjuvant radiation; role of adjuvant systemic therapy including-chemotherapy antihormone therapy.   # Patient will likely need lumpectomy with sentinel lymph node evaluation; followed by radiation.   # Decision regarding chemotherapy based on final surgical pathology/gene assay.  Clinically based on current data available patient unlikely to receive chemotherapy.  Patient will benefit from antihormone therapy.  Clinically I discussed the potential benefits of each option; and also potential downsides in detail.   #Genetics: Discuss.  Patient has no siblings.  No biological children; adopted daughter.  Thank you Dr.Toth for allowing me to participate in the care of your pleasant patient. Please do not hesitate to contact me with questions or concerns in the interim.  # DISPOSITION: Patient was asked to call us back for an appointment with 2 to 3 weeks post surgery.   # no labs # follow up TBD-Dr.B   All questions were answered. The patient/family knows to call the clinic with any problems, questions or concerns.    Cammie Sickle, MD 06/15/2021 5:10 PM

## 2021-06-15 NOTE — Progress Notes (Signed)
RN Chaperoned provider with Breast Exam.   

## 2021-06-16 ENCOUNTER — Ambulatory Visit: Payer: PPO | Admitting: Family Medicine

## 2021-06-18 ENCOUNTER — Other Ambulatory Visit: Payer: Self-pay | Admitting: Family Medicine

## 2021-06-19 ENCOUNTER — Ambulatory Visit
Admission: RE | Admit: 2021-06-19 | Discharge: 2021-06-19 | Disposition: A | Discharge status: Home / Self Care | Payer: PPO | Source: Ambulatory Visit | Attending: General Surgery | Admitting: General Surgery

## 2021-06-19 DIAGNOSIS — C50211 Malignant neoplasm of upper-inner quadrant of right female breast: Secondary | ICD-10-CM | POA: Diagnosis not present

## 2021-06-19 DIAGNOSIS — C50411 Malignant neoplasm of upper-outer quadrant of right female breast: Secondary | ICD-10-CM

## 2021-06-19 IMAGING — MR MR BREAST BILAT WO/W CM
8 of 12 series · 33 of 48 positions shown · IV contrast (7 ml gadavist)
Comparison: Previous exam(s).

CLINICAL DATA: Recently diagnosed right breast cancer.

LABS:  None
EXAM:
BILATERAL BREAST MRI WITH AND WITHOUT CONTRAST
TECHNIQUE: Multiplanar, multisequence MR images of both breasts were obtained
prior to and following the intravenous administration of 7 ml of
Gadavist

[Series 2: t2_tirm_tra ipat (a-p) · axial · 3.0mm · 0.70mm/px · 1 of 50 slices shown]
[im 1/50]
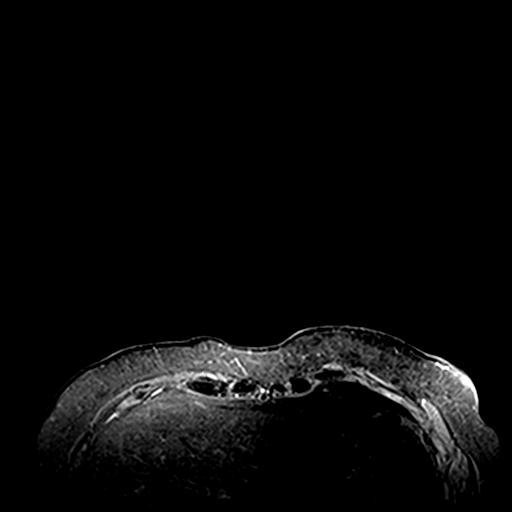

[Series 3: fl3d pre-cm no · axial · non-contrast · 1.2mm · 0.94mm/px · z∈[-49,+103]mm · 4 of 128 slices shown]
[im 1/128]
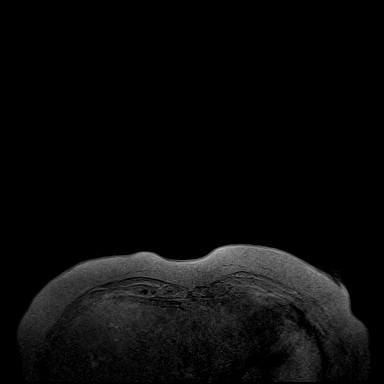
[im 43/128]
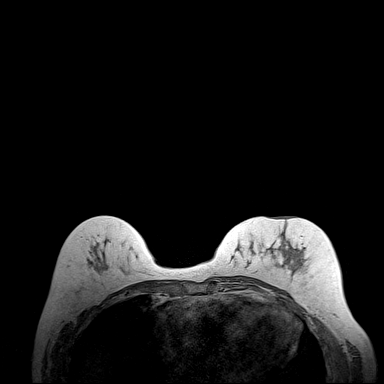
[im 85/128]
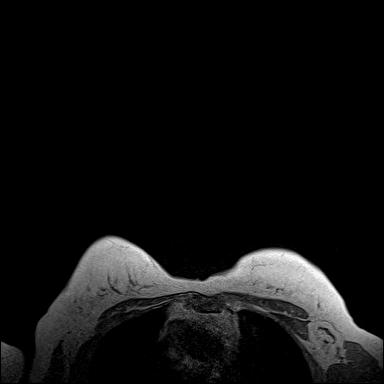
[im 128/128]
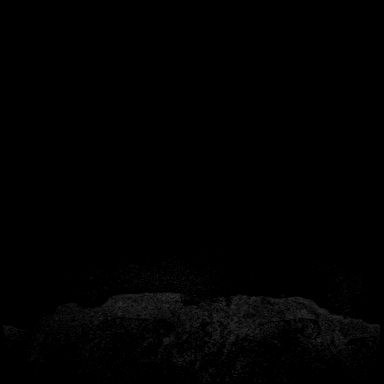

[Series 4: fl3d pre-cm · axial · non-contrast · 1.2mm · 0.94mm/px · z∈[-59,+113]mm · 5 of 144 slices shown]
[im 1/144]
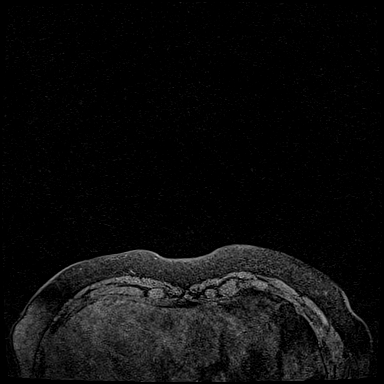
[im 36/144]
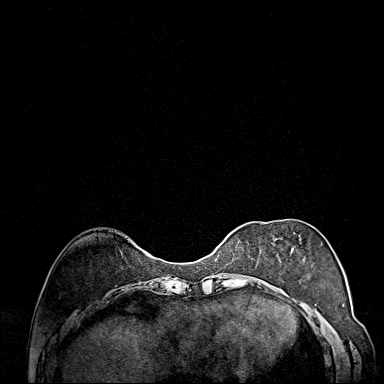
[im 72/144]
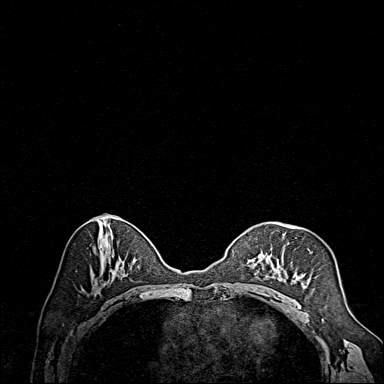
[im 108/144]
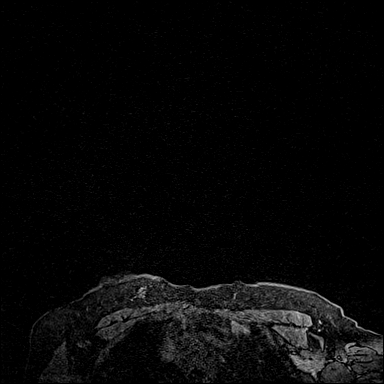
[im 144/144]
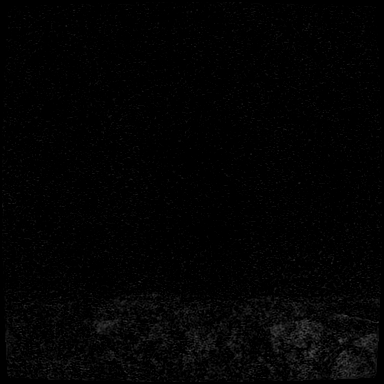

[Series 5: fl3d post immediate · axial · 1.2mm · 0.94mm/px · z∈[-59,+113]mm · 5 of 144 slices shown (1 of 3)]
[im 1/144]
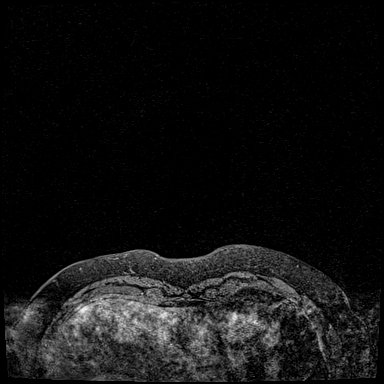
[im 36/144]
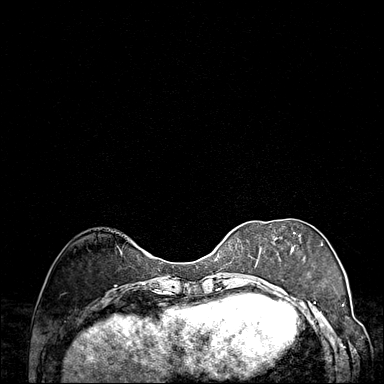
[im 72/144]
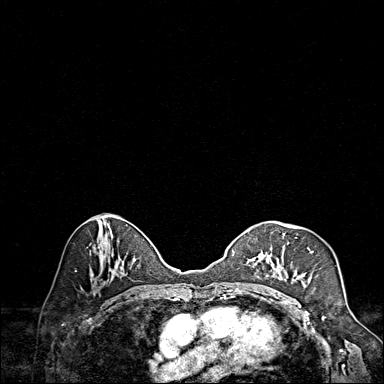
[im 108/144]
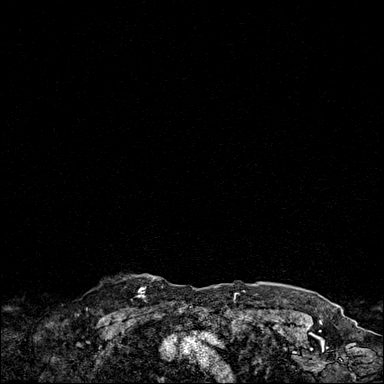
[im 144/144]
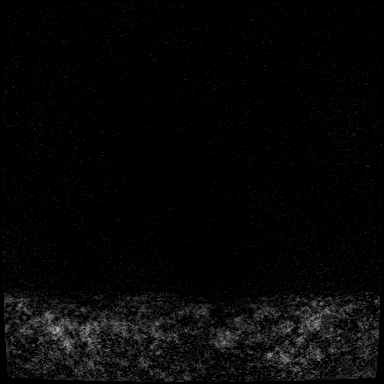

[Series 6: fl3d post immediate · axial · 1.2mm · 0.94mm/px · z∈[-59,+113]mm · 6 of 144 slices shown (2 of 3)]
[im 1/144]
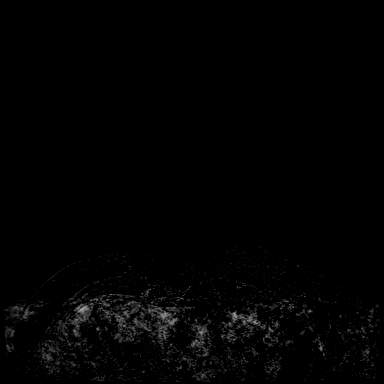
[im 29/144]
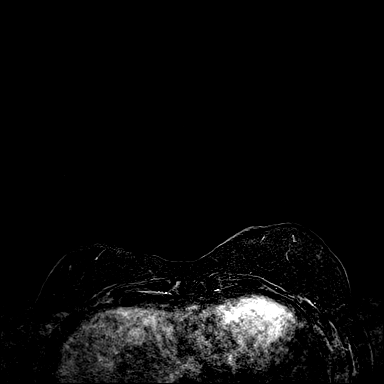
[im 58/144]
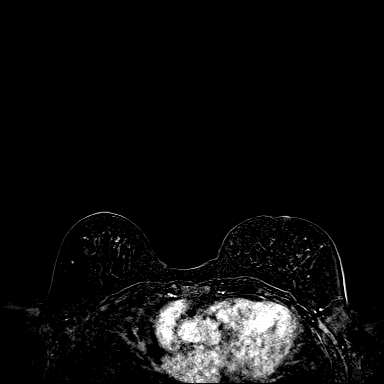
[im 86/144]
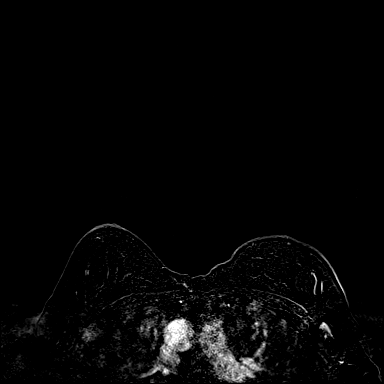
[im 115/144]
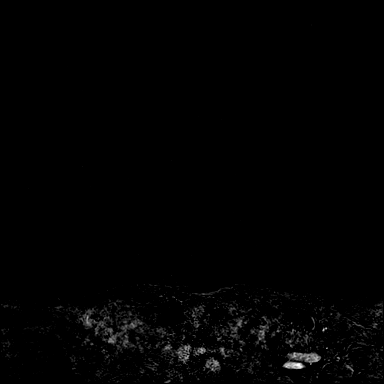
[im 144/144]
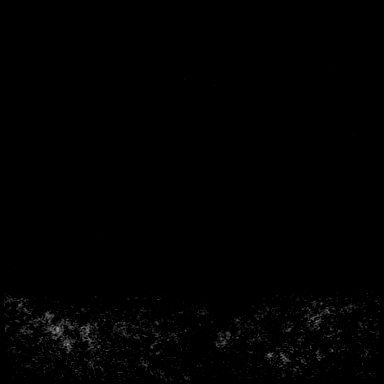

[Series 7: fl3d post immediate · axial · 172.8mm · 0.94mm/px · 1 of 1 slices shown (3 of 3)]
[im 1/1]
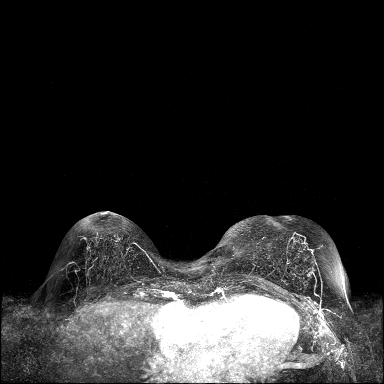

[Series 8: fl3d post 3min · axial · 1.2mm · 0.94mm/px · z∈[-59,+113]mm · 6 of 144 slices shown]
[im 1/144]
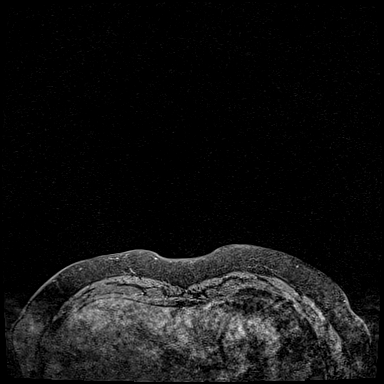
[im 29/144]
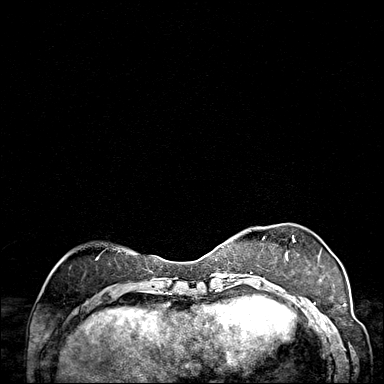
[im 58/144]
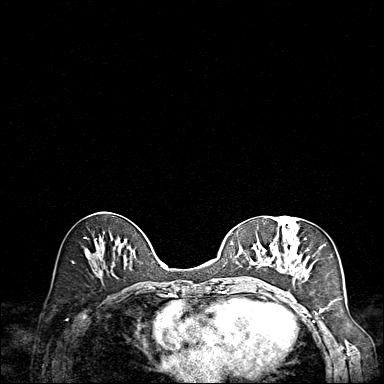
[im 86/144]
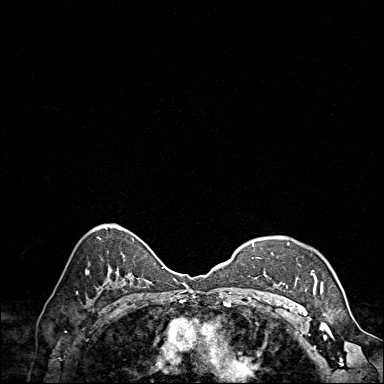
[im 115/144]
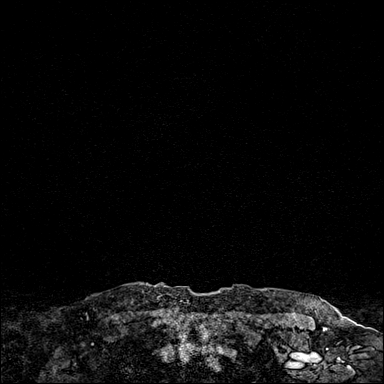
[im 144/144]
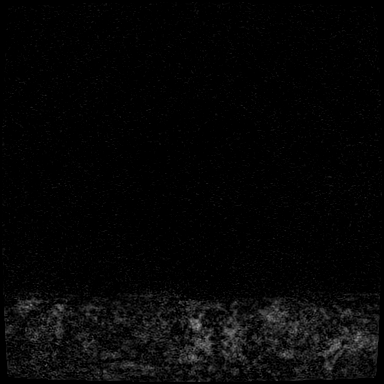

[Series 9: fl3d post 3min_sub · axial · 1.2mm · 0.94mm/px · z∈[-59,+78]mm · 5 of 144 slices shown]
[im 1/144]
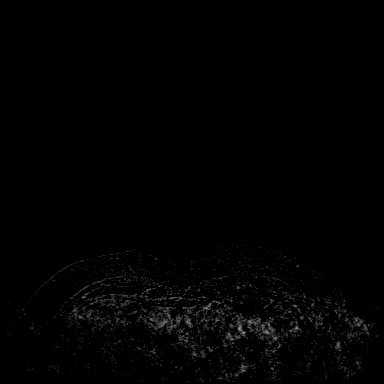
[im 29/144]
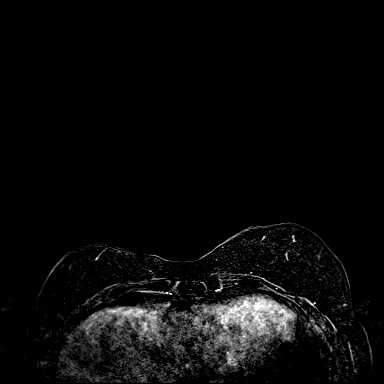
[im 58/144]
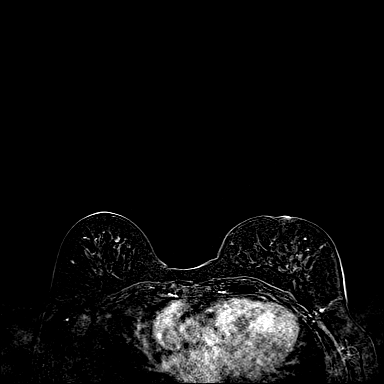
[im 86/144]
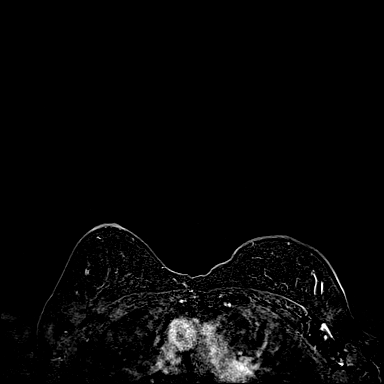
[im 115/144]
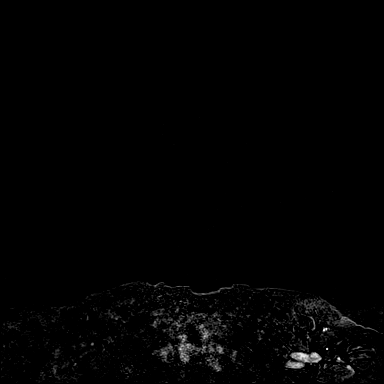

[33 of 48 positions shown; findings below may reference images not displayed]

Three-dimensional MR images were rendered by post-processing of the
original MR data on an independent workstation. The
three-dimensional MR images were interpreted, and findings are
reported in the following complete MRI report for this study. Three
dimensional images were evaluated at the independent interpreting
workstation using the DynaCAD thin client.
FINDINGS: Breast composition: c. Heterogeneous fibroglandular tissue.

Background parenchymal enhancement: Mild

Right breast: The known malignancy is seen within the upper inner
right breast at a posterior depth, containing a biopsy clip. The
malignancy measures 1.8 by 1.4 by 1.2 cm in craniocaudal, AP, and
transverse dimensions. No other abnormalities in the right breast.

Left breast: No mass or abnormal enhancement.

Lymph nodes: No abnormal appearing lymph nodes.

Ancillary findings:  None.
IMPRESSION: Known malignancy in the upper inner right breast measuring 1.8 x
x 1.2 cm. No other suspicious findings.

RECOMMENDATION:
Recommend continued surgical and oncologic follow up.

BI-RADS CATEGORY  6: Known biopsy-proven malignancy.

## 2021-06-19 MED ORDER — GADOPENTETATE DIMEGLUMINE 469.01 MG/ML IV SOLN
7.0000 mL | Freq: Once | INTRAVENOUS | Status: AC | PRN
  Administered 2021-06-19: 7 mL via INTRAVENOUS

## 2021-06-27 ENCOUNTER — Ambulatory Visit: Payer: Self-pay | Admitting: General Surgery

## 2021-06-27 DIAGNOSIS — Z170 Estrogen receptor positive status [ER+]: Secondary | ICD-10-CM

## 2021-06-28 ENCOUNTER — Other Ambulatory Visit: Payer: Self-pay | Admitting: General Surgery

## 2021-06-28 DIAGNOSIS — C50211 Malignant neoplasm of upper-inner quadrant of right female breast: Secondary | ICD-10-CM

## 2021-06-28 DIAGNOSIS — Z170 Estrogen receptor positive status [ER+]: Secondary | ICD-10-CM

## 2021-07-06 ENCOUNTER — Ambulatory Visit: Payer: PPO | Attending: General Surgery | Admitting: Rehabilitation

## 2021-07-06 ENCOUNTER — Other Ambulatory Visit: Payer: Self-pay

## 2021-07-06 ENCOUNTER — Encounter: Payer: Self-pay | Admitting: Rehabilitation

## 2021-07-06 DIAGNOSIS — C50211 Malignant neoplasm of upper-inner quadrant of right female breast: Secondary | ICD-10-CM

## 2021-07-06 DIAGNOSIS — R293 Abnormal posture: Secondary | ICD-10-CM

## 2021-07-06 DIAGNOSIS — Z170 Estrogen receptor positive status [ER+]: Secondary | ICD-10-CM | POA: Diagnosis not present

## 2021-07-06 NOTE — Therapy (Signed)
[place], [place], [address] Phone: [phone]   Fax:  [phone]  Physical Therapy Evaluation  Patient Details  Name: April Ross MRN: [id number] Date of Birth: 1950-10-28 Referring Provider (PT): Dr. Marlou Starks   Encounter Date: 07/06/2021   PT End of Session - 07/06/21 1513     Visit Number 1    Number of Visits 2    Date for PT Re-Evaluation 08/17/21    PT Start Time 6433    PT Stop Time 1300    PT Time Calculation (min) 30 min    Activity Tolerance Patient tolerated treatment well    Behavior During Therapy [facility] for tasks assessed/performed             Past Medical History:  Diagnosis Date   Allergy    Arthritis    CRD (chronic renal disease)    stage 3   Depression    Diabetes mellitus without complication (HCC)    GERD (gastroesophageal reflux disease)    Hyperlipidemia    Hypertension    LBBB (left bundle branch block)    a. diagnosed in 10/2017 - echo showed a preserved EF of 55-60% and NST was low-risk showing no evidence of ischemia.    Osteoporosis    polyps in colon    Thyroid disease    Urinary tract infection     Past Surgical History:  Procedure Laterality Date   ABDOMINAL HYSTERECTOMY     APPENDECTOMY      There were no vitals filed for this visit.    Subjective Assessment - 07/06/21 1220     Subjective No troubles    Pertinent History Pt will be having a Rt lumpectomy and SLNB with Dr. Marlou Starks on 07/24/21 with radiation.   other history includes: DM, stage 3 CKD, osteoporosis    Patient Stated Goals get information from providers    Currently in Pain? No/denies                [facility] PT Assessment - 07/06/21 0001       Assessment   Medical Diagnosis Rt breast cancer    Referring Provider (PT) Dr. Marlou Starks    Onset Date/Surgical Date 07/06/21    Hand Dominance Right    Prior Therapy no      Precautions   Precaution Comments active cancer       Restrictions   Weight Bearing Restrictions No      Balance Screen   Has the patient fallen in the past 6 months No    Has the patient had a decrease in activity level because of a fear of falling?  No    Is the patient reluctant to leave their home because of a fear of falling?  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    Leisure walking      Cognition   Overall Cognitive Status Within Functional Limits for tasks assessed      Posture/Postural Control   Posture/Postural Control Postural limitations    Postural Limitations Rounded Shoulders;Forward head      ROM / Strength   AROM / PROM / Strength  AROM      AROM   AROM Assessment Site Shoulder    Right/Left Shoulder Right;Left    Right Shoulder Extension 45 Degrees    Right Shoulder Flexion 155 Degrees    Right Shoulder ABduction 170 Degrees    Right Shoulder External Rotation 90 Degrees    Left Shoulder Extension 55 Degrees    Left Shoulder Flexion 155 Degrees    Left Shoulder ABduction 170 Degrees    Left Shoulder External Rotation 90 Degrees               LYMPHEDEMA/ONCOLOGY QUESTIONNAIRE - 07/06/21 0001       Type   Cancer Type Rt breast      Lymphedema Assessments   Lymphedema Assessments Upper extremities      Right Upper Extremity Lymphedema   10 cm Proximal to Olecranon Process 25.8 cm    Olecranon Process 24 cm    10 cm Proximal to Ulnar Styloid Process 20.1 cm    Just Proximal to Ulnar Styloid Process 14.5 cm    Across Hand at [employer] 18.4 cm    At [place] of 2nd Digit 6 cm      Left Upper Extremity Lymphedema   10 cm Proximal to Olecranon Process 24.9 cm    Olecranon Process 24 cm    10 cm Proximal to Ulnar  Styloid Process 20.8 cm    Just Proximal to Ulnar Styloid Process 15 cm    Across Hand at [employer] 18.6 cm    At [place] of 2nd Digit 6.3 cm             L-DEX FLOWSHEETS - 07/06/21 1500       L-DEX LYMPHEDEMA SCREENING   Measurement Type Unilateral    L-DEX MEASUREMENT EXTREMITY Upper Extremity    POSITION  Standing    DOMINANT SIDE Right    At Risk Side Right                    Objective measurements completed on examination: See above findings.       [facility] [place] [facility] Adult PT Treatment/Exercise - 07/06/21 0001       Self-Care   Self-Care Other Self-Care Comments    Other Self-Care Comments  education briefly on lymphedema, post op exercises to start, SOZO and reasons for lymphedema surveillance, and when to return for follow up                     PT Education - 07/06/21 1512     Education Details education briefly on lymphedema, post op exercises to start, SOZO and reasons for lymphedema surveillance, and when to return for follow up    Person(s) Educated Patient    Methods Explanation;Demonstration;Handout    Comprehension Verbalized understanding;Returned demonstration                 PT Long Term Goals - 07/06/21 1519       PT LONG TERM GOAL #1   Title Pt will return to baseline AROM    Time [facility] Goals - 07/06/21 1519       Patient will be able to verbalize understanding of pertinent lymphedema risk reduction practices relevant to her diagnosis specifically related to skin care.   Status Achieved      Patient will be able to  return demonstrate and/or verbalize understanding of the post-op home exercise program related to regaining shoulder range of motion.   Status Achieved      Patient will be able to verbalize understanding of the importance of attending the postoperative After Breast Cancer Class for further lymphedema risk reduction education and therapeutic  exercise.   Status Achieved                   Plan - 07/06/21 1514     Clinical Impression Statement Pt presents for pre surgical baselines and education on post op stretches and lymphedema surveillance.  Pt will return 3 weeks post lumpectomy for reassessment and will start SOZO screening every 3 months.    Personal Factors and Comorbidities Age;Comorbidity 3+    Comorbidities DM, osteoporosis, CKD    Stability/Clinical Decision Making Stable/Uncomplicated    Clinical Decision Making Low    Rehab Potential Excellent    PT Frequency --   post op check   PT Duration 8 weeks    PT Treatment/Interventions ADLs/Self Care Home Management;Patient/family education;Therapeutic exercise;Manual techniques    PT Next Visit Plan post op check, schedule SOZO and ABC    PT Home Exercise Plan post op    Consulted and Agree with Plan of Care Patient             Patient will benefit from skilled therapeutic intervention in order to improve the following deficits and impairments:  Postural dysfunction, Decreased knowledge of precautions  Visit Diagnosis: Carcinoma of upper-inner quadrant of right breast in female, estrogen receptor positive ([place])  Abnormal posture     Problem List Patient Active Problem List   Diagnosis Date Noted   Carcinoma of upper-inner quadrant of right breast in female, estrogen receptor positive ([place]) 06/15/2021   Diabetic retinopathy ([place]) 11/24/2020   ETD (Eustachian tube dysfunction), right 08/04/2019   Chronic bilateral thoracic back pain 11/21/2018   Type 2 diabetes mellitus with nephropathy ([place]) 10/27/2017   Tobacco abuse 10/27/2017   Nonspecific chest pain    Dizziness 05/16/2017   Osteoporosis 05/24/2016   CKD (chronic kidney disease) stage 3, GFR 30-59 ml/min ([place]) 05/08/2016   Hyperlipidemia associated with type 2 diabetes mellitus ([place]) 01/12/2016   Hypothyroidism 01/12/2016   Hypertension associated with diabetes ([place]) 01/12/2016    Allergic rhinitis 01/12/2016   Moderate major depression, single episode ([place]) 01/12/2016   Chronic insomnia 01/12/2016   GERD (gastroesophageal reflux disease) 01/12/2016    Stark Bray, PT 07/06/2021, 3:20 PM  [place], [place], [address] Phone: [phone]   Fax:  [phone]  Name: Tiajuana Leppanen Bartow MRN: [id number] Date of Birth: 1950-12-18

## 2021-07-06 NOTE — Patient Instructions (Signed)

## 2021-07-08 HISTORY — PX: BREAST LUMPECTOMY: SHX2

## 2021-07-14 ENCOUNTER — Encounter (HOSPITAL_BASED_OUTPATIENT_CLINIC_OR_DEPARTMENT_OTHER): Payer: Self-pay | Admitting: General Surgery

## 2021-07-21 ENCOUNTER — Encounter (HOSPITAL_BASED_OUTPATIENT_CLINIC_OR_DEPARTMENT_OTHER)
Admission: RE | Admit: 2021-07-21 | Discharge: 2021-07-21 | Disposition: A | Discharge status: Home / Self Care | Payer: PPO | Source: Ambulatory Visit | Attending: General Surgery | Admitting: General Surgery

## 2021-07-21 ENCOUNTER — Ambulatory Visit
Admission: RE | Admit: 2021-07-21 | Discharge: 2021-07-21 | Disposition: A | Discharge status: Home / Self Care | Payer: PPO | Source: Ambulatory Visit | Attending: General Surgery | Admitting: General Surgery

## 2021-07-21 ENCOUNTER — Other Ambulatory Visit: Payer: Self-pay

## 2021-07-21 DIAGNOSIS — C50211 Malignant neoplasm of upper-inner quadrant of right female breast: Secondary | ICD-10-CM

## 2021-07-21 DIAGNOSIS — F17210 Nicotine dependence, cigarettes, uncomplicated: Secondary | ICD-10-CM | POA: Diagnosis not present

## 2021-07-21 DIAGNOSIS — N189 Chronic kidney disease, unspecified: Secondary | ICD-10-CM | POA: Diagnosis not present

## 2021-07-21 DIAGNOSIS — E785 Hyperlipidemia, unspecified: Secondary | ICD-10-CM | POA: Diagnosis not present

## 2021-07-21 DIAGNOSIS — E079 Disorder of thyroid, unspecified: Secondary | ICD-10-CM | POA: Diagnosis not present

## 2021-07-21 DIAGNOSIS — C773 Secondary and unspecified malignant neoplasm of axilla and upper limb lymph nodes: Secondary | ICD-10-CM | POA: Diagnosis not present

## 2021-07-21 DIAGNOSIS — R928 Other abnormal and inconclusive findings on diagnostic imaging of breast: Secondary | ICD-10-CM | POA: Diagnosis not present

## 2021-07-21 DIAGNOSIS — Z79890 Hormone replacement therapy (postmenopausal): Secondary | ICD-10-CM | POA: Diagnosis not present

## 2021-07-21 DIAGNOSIS — Z886 Allergy status to analgesic agent status: Secondary | ICD-10-CM | POA: Diagnosis not present

## 2021-07-21 DIAGNOSIS — Z7984 Long term (current) use of oral hypoglycemic drugs: Secondary | ICD-10-CM | POA: Diagnosis not present

## 2021-07-21 DIAGNOSIS — Z7985 Long-term (current) use of injectable non-insulin antidiabetic drugs: Secondary | ICD-10-CM | POA: Diagnosis not present

## 2021-07-21 DIAGNOSIS — C50411 Malignant neoplasm of upper-outer quadrant of right female breast: Secondary | ICD-10-CM | POA: Diagnosis not present

## 2021-07-21 DIAGNOSIS — Z79899 Other long term (current) drug therapy: Secondary | ICD-10-CM | POA: Diagnosis not present

## 2021-07-21 DIAGNOSIS — I129 Hypertensive chronic kidney disease with stage 1 through stage 4 chronic kidney disease, or unspecified chronic kidney disease: Secondary | ICD-10-CM | POA: Diagnosis not present

## 2021-07-21 DIAGNOSIS — Z170 Estrogen receptor positive status [ER+]: Secondary | ICD-10-CM

## 2021-07-21 DIAGNOSIS — E1122 Type 2 diabetes mellitus with diabetic chronic kidney disease: Secondary | ICD-10-CM | POA: Diagnosis not present

## 2021-07-21 LAB — BASIC METABOLIC PANEL
Anion gap: 8 (ref 5–15)
BUN: 22 mg/dL (ref 8–23)
CO2: 24 mmol/L (ref 22–32)
Calcium: 10 mg/dL (ref 8.9–10.3)
Chloride: 107 mmol/L (ref 98–111)
Creatinine, Ser: 1.45 mg/dL — ABNORMAL HIGH (ref 0.44–1.00)
GFR, Estimated: 38 mL/min — ABNORMAL LOW (ref >60)
Glucose, Bld: 211 mg/dL — ABNORMAL HIGH (ref 70–99)
Potassium: 4.3 mmol/L (ref 3.5–5.1)
Sodium: 139 mmol/L (ref 135–145)

## 2021-07-21 NOTE — Progress Notes (Signed)
Message left as a reminder for patient to have preoperative lab work completed today. Patient informed to arrive by 8:45am day of surgery if unable to complete labs today.

## 2021-07-24 ENCOUNTER — Ambulatory Visit (HOSPITAL_BASED_OUTPATIENT_CLINIC_OR_DEPARTMENT_OTHER): Payer: PPO | Admitting: Anesthesiology

## 2021-07-24 ENCOUNTER — Ambulatory Visit (HOSPITAL_COMMUNITY)
Admission: RE | Admit: 2021-07-24 | Discharge: 2021-07-24 | Disposition: A | Discharge status: Home / Self Care | Payer: PPO | Source: Ambulatory Visit | Attending: General Surgery | Admitting: General Surgery

## 2021-07-24 ENCOUNTER — Ambulatory Visit (HOSPITAL_BASED_OUTPATIENT_CLINIC_OR_DEPARTMENT_OTHER)
Admission: RE | Admit: 2021-07-24 | Discharge: 2021-07-24 | Disposition: A | Discharge status: Home / Self Care | Payer: PPO | Attending: General Surgery | Admitting: General Surgery

## 2021-07-24 ENCOUNTER — Other Ambulatory Visit: Payer: Self-pay

## 2021-07-24 ENCOUNTER — Encounter (HOSPITAL_BASED_OUTPATIENT_CLINIC_OR_DEPARTMENT_OTHER): Payer: Self-pay | Admitting: General Surgery

## 2021-07-24 ENCOUNTER — Ambulatory Visit
Admission: RE | Admit: 2021-07-24 | Discharge: 2021-07-24 | Disposition: A | Discharge status: Home / Self Care | Payer: PPO | Source: Ambulatory Visit | Attending: General Surgery | Admitting: General Surgery

## 2021-07-24 ENCOUNTER — Encounter (HOSPITAL_BASED_OUTPATIENT_CLINIC_OR_DEPARTMENT_OTHER)
Admission: RE | Disposition: A | Discharge status: Home / Self Care | Payer: Self-pay | Source: Home / Self Care | Attending: General Surgery

## 2021-07-24 ENCOUNTER — Encounter: Payer: Self-pay | Admitting: Family Medicine

## 2021-07-24 DIAGNOSIS — Z7985 Long-term (current) use of injectable non-insulin antidiabetic drugs: Secondary | ICD-10-CM | POA: Insufficient documentation

## 2021-07-24 DIAGNOSIS — E1122 Type 2 diabetes mellitus with diabetic chronic kidney disease: Secondary | ICD-10-CM | POA: Diagnosis not present

## 2021-07-24 DIAGNOSIS — C773 Secondary and unspecified malignant neoplasm of axilla and upper limb lymph nodes: Secondary | ICD-10-CM | POA: Insufficient documentation

## 2021-07-24 DIAGNOSIS — C50211 Malignant neoplasm of upper-inner quadrant of right female breast: Secondary | ICD-10-CM | POA: Diagnosis not present

## 2021-07-24 DIAGNOSIS — E079 Disorder of thyroid, unspecified: Secondary | ICD-10-CM | POA: Insufficient documentation

## 2021-07-24 DIAGNOSIS — F17210 Nicotine dependence, cigarettes, uncomplicated: Secondary | ICD-10-CM | POA: Diagnosis not present

## 2021-07-24 DIAGNOSIS — G8918 Other acute postprocedural pain: Secondary | ICD-10-CM | POA: Diagnosis not present

## 2021-07-24 DIAGNOSIS — Z79890 Hormone replacement therapy (postmenopausal): Secondary | ICD-10-CM | POA: Insufficient documentation

## 2021-07-24 DIAGNOSIS — N189 Chronic kidney disease, unspecified: Secondary | ICD-10-CM | POA: Insufficient documentation

## 2021-07-24 DIAGNOSIS — Z886 Allergy status to analgesic agent status: Secondary | ICD-10-CM | POA: Insufficient documentation

## 2021-07-24 DIAGNOSIS — Z7984 Long term (current) use of oral hypoglycemic drugs: Secondary | ICD-10-CM | POA: Insufficient documentation

## 2021-07-24 DIAGNOSIS — C50911 Malignant neoplasm of unspecified site of right female breast: Secondary | ICD-10-CM | POA: Diagnosis not present

## 2021-07-24 DIAGNOSIS — Z170 Estrogen receptor positive status [ER+]: Secondary | ICD-10-CM

## 2021-07-24 DIAGNOSIS — E039 Hypothyroidism, unspecified: Secondary | ICD-10-CM | POA: Diagnosis not present

## 2021-07-24 DIAGNOSIS — E785 Hyperlipidemia, unspecified: Secondary | ICD-10-CM | POA: Insufficient documentation

## 2021-07-24 DIAGNOSIS — Z79899 Other long term (current) drug therapy: Secondary | ICD-10-CM | POA: Insufficient documentation

## 2021-07-24 DIAGNOSIS — C50411 Malignant neoplasm of upper-outer quadrant of right female breast: Secondary | ICD-10-CM | POA: Diagnosis not present

## 2021-07-24 DIAGNOSIS — I129 Hypertensive chronic kidney disease with stage 1 through stage 4 chronic kidney disease, or unspecified chronic kidney disease: Secondary | ICD-10-CM | POA: Diagnosis not present

## 2021-07-24 DIAGNOSIS — K219 Gastro-esophageal reflux disease without esophagitis: Secondary | ICD-10-CM | POA: Diagnosis not present

## 2021-07-24 HISTORY — PX: BREAST LUMPECTOMY WITH RADIOACTIVE SEED AND SENTINEL LYMPH NODE BIOPSY: SHX6550

## 2021-07-24 LAB — GLUCOSE, CAPILLARY
Glucose-Capillary: 133 mg/dL — ABNORMAL HIGH (ref 70–99)
Glucose-Capillary: 144 mg/dL — ABNORMAL HIGH (ref 70–99)

## 2021-07-24 IMAGING — MG MM BREAST SURGICAL SPECIMEN
1 series · 2 of 2 positions shown · non-contrast
Comparison: Previous exam(s).

CLINICAL DATA: Evaluate surgical specimen following lumpectomy for
RIGHT breast cancer.

EXAM:
SPECIMEN RADIOGRAPH OF THE RIGHT BREAST

[Series 1: R · right · 0.07mm/px · 2 of 2 slices shown]
[im 1/2]
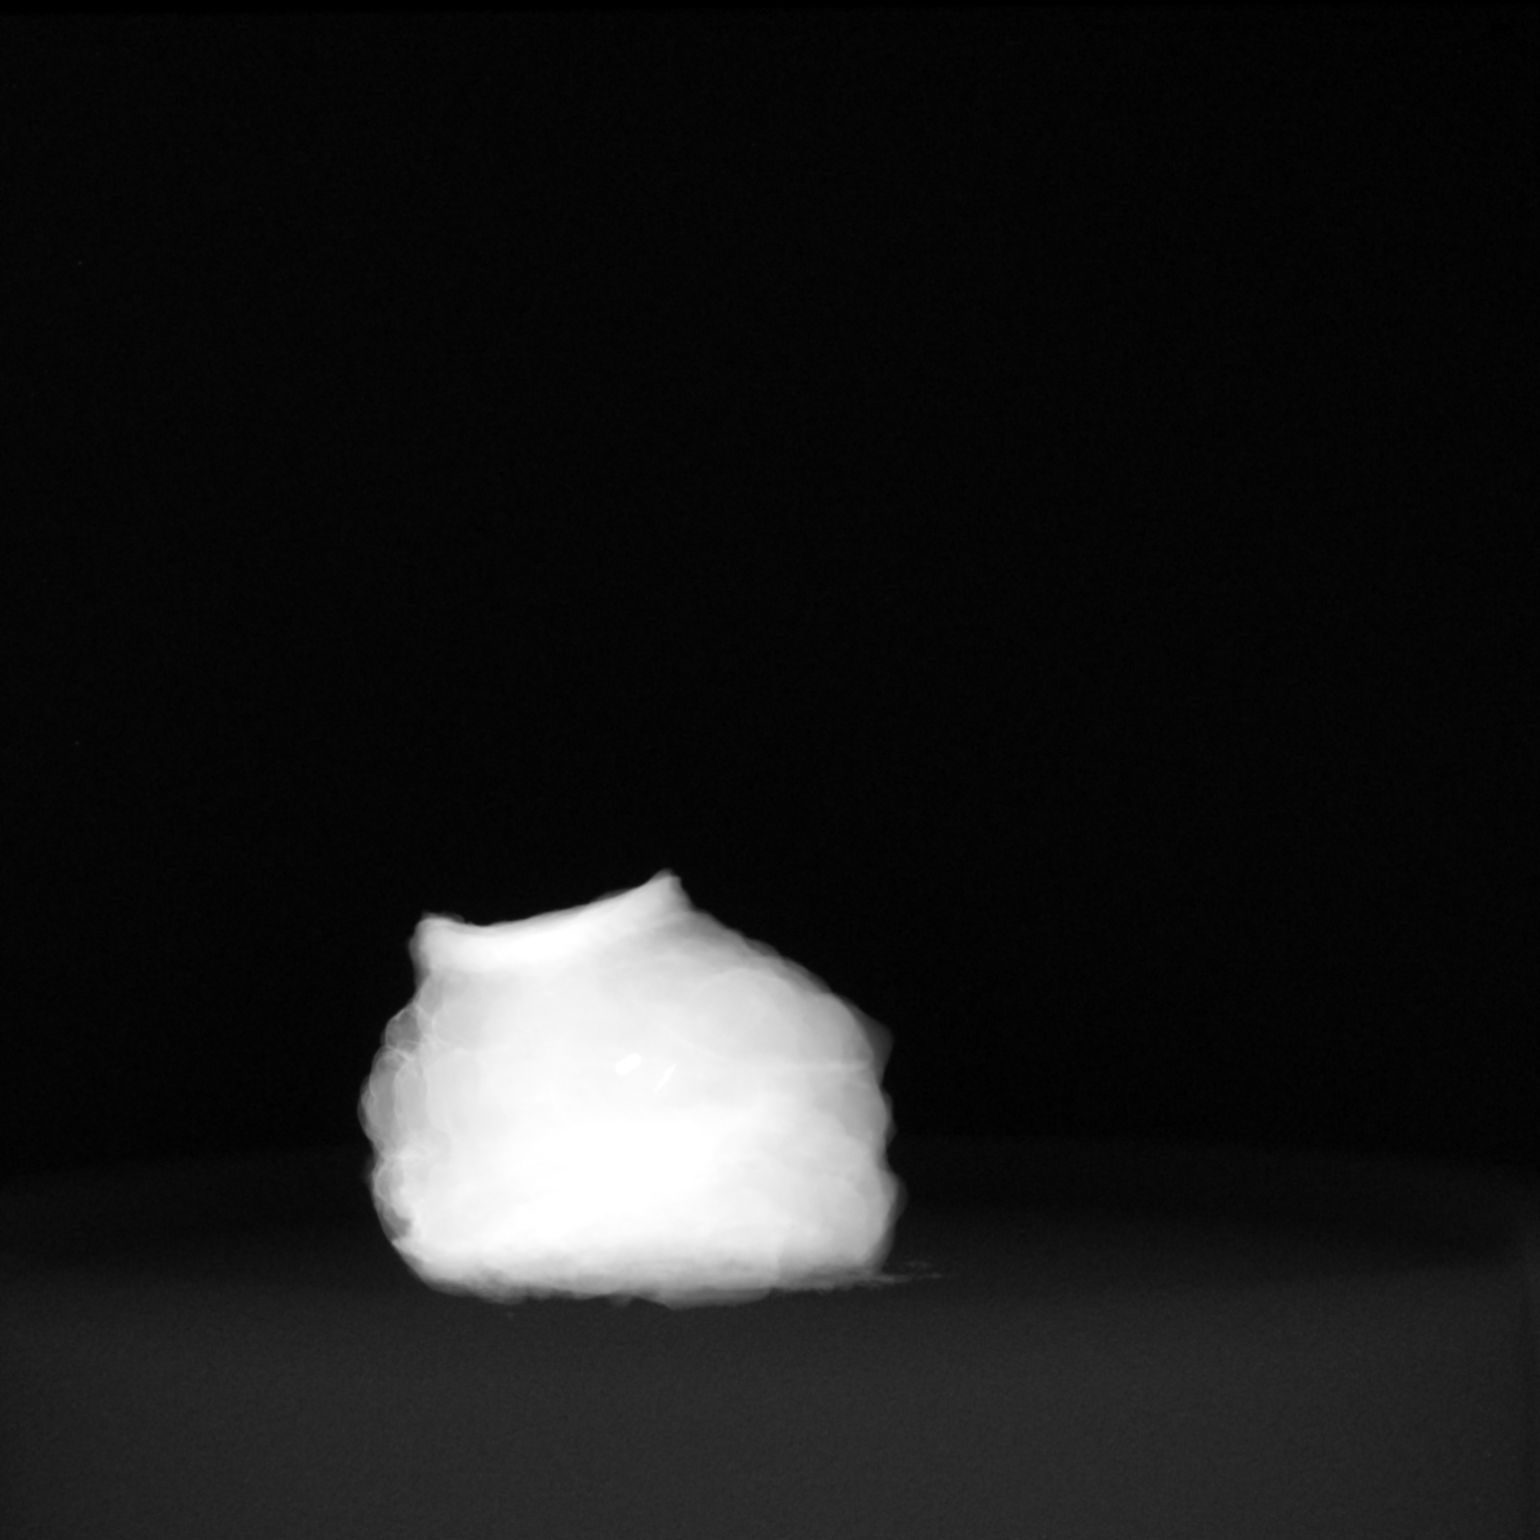
[im 2/2]
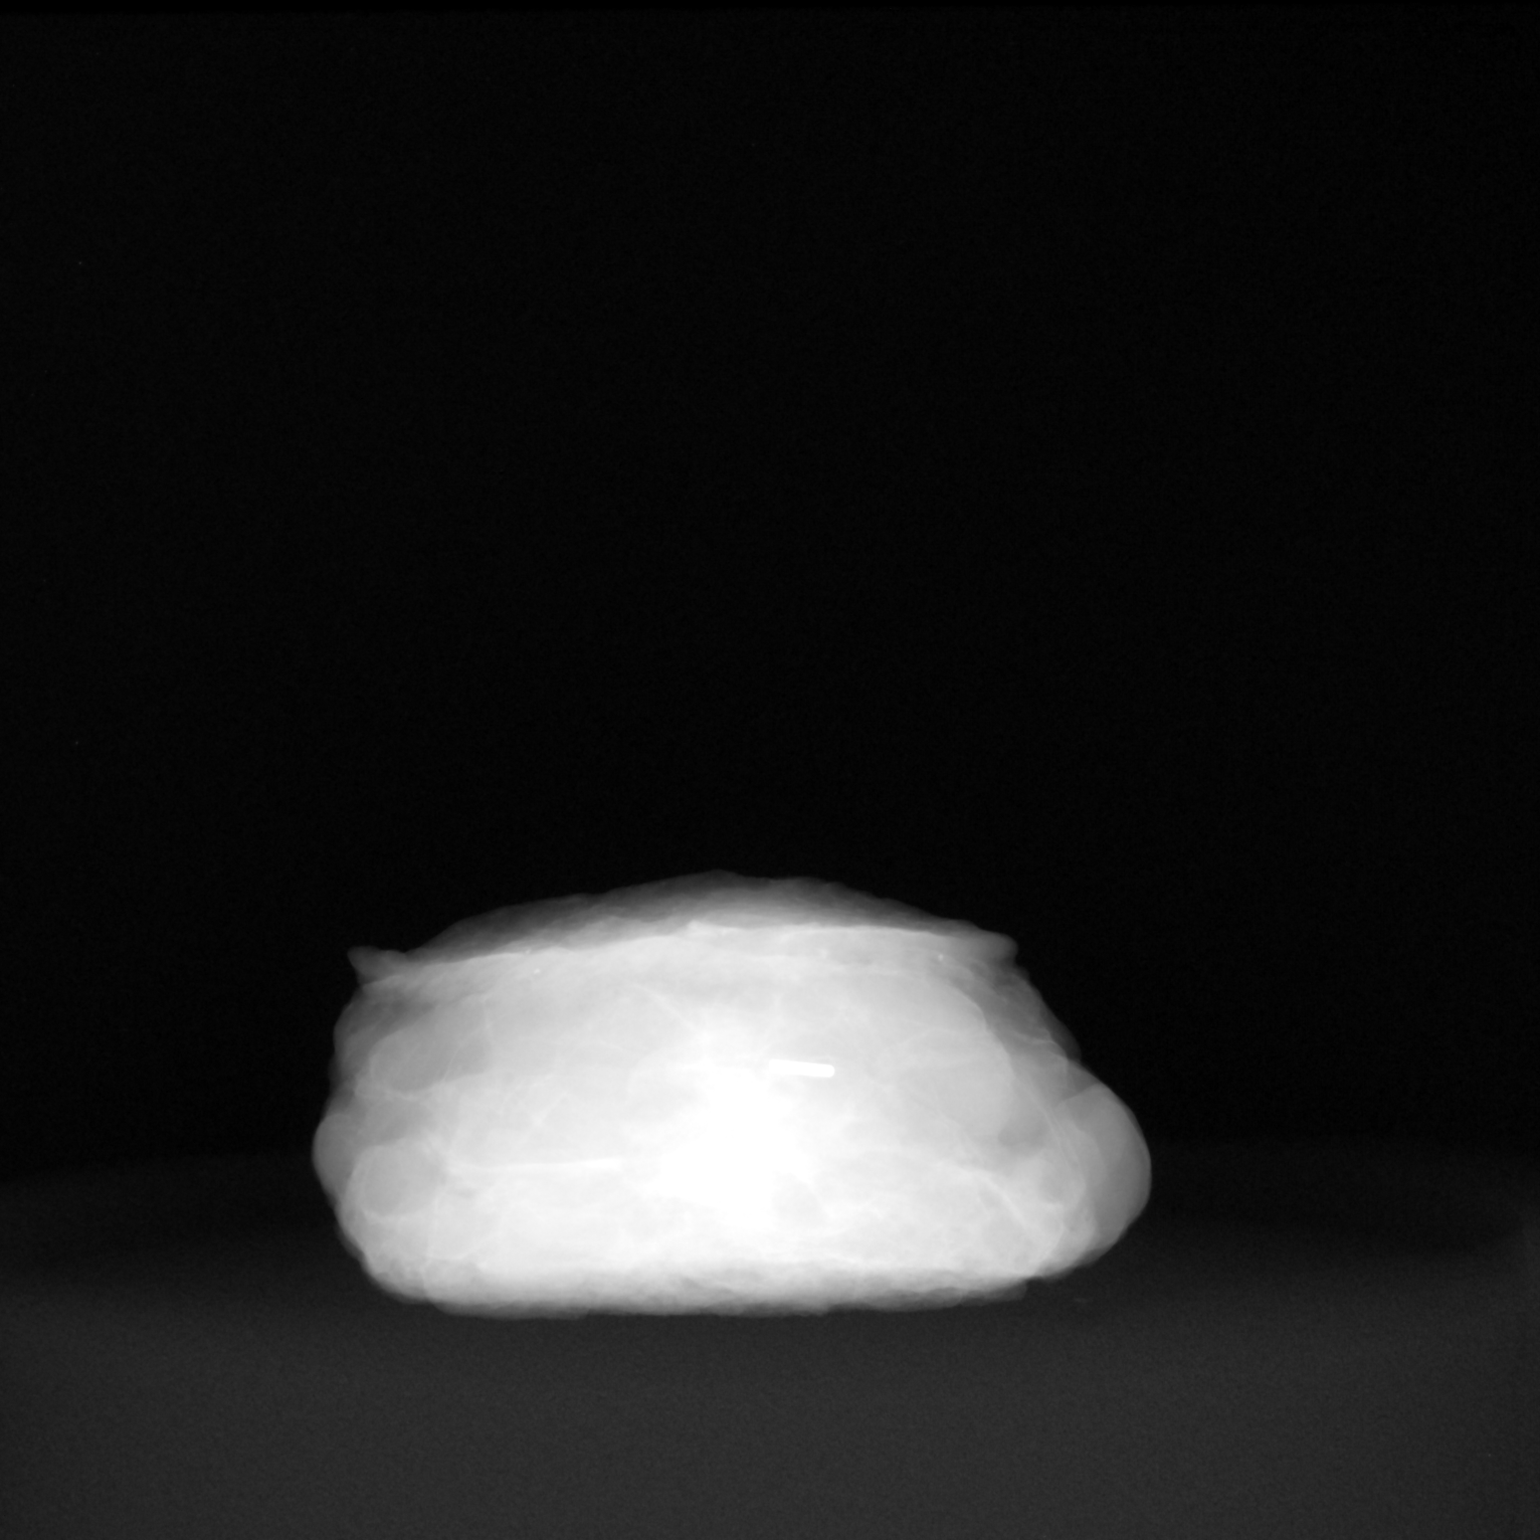

[2 of 2 positions shown; findings below may reference images not displayed]

FINDINGS: Status post excision of the RIGHT breast. The radioactive seed and X
biopsy marker clip are present, completely intact, and were marked
for pathology.
IMPRESSION: Specimen radiograph of the RIGHT breast.

## 2021-07-24 SURGERY — BREAST LUMPECTOMY WITH RADIOACTIVE SEED AND SENTINEL LYMPH NODE BIOPSY
Anesthesia: General | Site: Breast | Laterality: Right

## 2021-07-24 MED ORDER — BUPIVACAINE-EPINEPHRINE 0.25% -1:200000 IJ SOLN
INTRAMUSCULAR | Status: DC | PRN
  Administered 2021-07-24: 20 mL

## 2021-07-24 MED ORDER — OXYCODONE HCL 5 MG PO TABS
5.0000 mg | ORAL_TABLET | Freq: Once | ORAL | Status: AC | PRN
  Administered 2021-07-24: 5 mg via ORAL

## 2021-07-24 MED ORDER — CHLORHEXIDINE GLUCONATE CLOTH 2 % EX PADS: 6.0000 | MEDICATED_PAD | Freq: Once | CUTANEOUS | Status: DC

## 2021-07-24 MED ORDER — LIDOCAINE 2% (20 MG/ML) 5 ML SYRINGE
INTRAMUSCULAR | Status: DC | PRN
  Administered 2021-07-24: 40 mg via INTRAVENOUS

## 2021-07-24 MED ORDER — FENTANYL CITRATE (PF) 100 MCG/2ML IJ SOLN
INTRAMUSCULAR | Status: AC
  Filled 2021-07-24: qty 2

## 2021-07-24 MED ORDER — ONDANSETRON HCL 4 MG/2ML IJ SOLN
INTRAMUSCULAR | Status: AC
  Filled 2021-07-24: qty 2

## 2021-07-24 MED ORDER — CHLORHEXIDINE GLUCONATE CLOTH 2 % EX PADS
6.0000 | MEDICATED_PAD | Freq: Once | CUTANEOUS | Status: DC
Start: 2021-07-24 — End: 2021-07-24

## 2021-07-24 MED ORDER — ONDANSETRON HCL 4 MG/2ML IJ SOLN
INTRAMUSCULAR | Status: DC | PRN
Start: 2021-07-24 — End: 2021-07-24
  Administered 2021-07-24: 4 mg via INTRAVENOUS

## 2021-07-24 MED ORDER — HYDROMORPHONE HCL 1 MG/ML IJ SOLN
0.2500 mg | INTRAMUSCULAR | Status: DC | PRN
  Administered 2021-07-24 (×4): 0.5 mg via INTRAVENOUS

## 2021-07-24 MED ORDER — HYDROMORPHONE HCL 1 MG/ML IJ SOLN
INTRAMUSCULAR | Status: AC
  Filled 2021-07-24: qty 0.5

## 2021-07-24 MED ORDER — FENTANYL CITRATE (PF) 100 MCG/2ML IJ SOLN
INTRAMUSCULAR | Status: DC | PRN
  Administered 2021-07-24: 25 ug via INTRAVENOUS

## 2021-07-24 MED ORDER — LIDOCAINE 2% (20 MG/ML) 5 ML SYRINGE
INTRAMUSCULAR | Status: AC
  Filled 2021-07-24: qty 5

## 2021-07-24 MED ORDER — OXYCODONE HCL 5 MG/5ML PO SOLN: 5.0000 mg | Freq: Once | ORAL | Status: AC | PRN

## 2021-07-24 MED ORDER — MIDAZOLAM HCL 2 MG/2ML IJ SOLN
INTRAMUSCULAR | Status: AC
  Filled 2021-07-24: qty 2

## 2021-07-24 MED ORDER — PROMETHAZINE HCL 25 MG/ML IJ SOLN: 6.2500 mg | INTRAMUSCULAR | Status: DC | PRN

## 2021-07-24 MED ORDER — OXYCODONE HCL 5 MG PO TABS
ORAL_TABLET | ORAL | Status: AC
  Filled 2021-07-24: qty 1

## 2021-07-24 MED ORDER — PROPOFOL 10 MG/ML IV BOLUS
INTRAVENOUS | Status: DC | PRN
  Administered 2021-07-24: 120 mg via INTRAVENOUS

## 2021-07-24 MED ORDER — DEXAMETHASONE SODIUM PHOSPHATE 10 MG/ML IJ SOLN
INTRAMUSCULAR | Status: AC
  Filled 2021-07-24: qty 1

## 2021-07-24 MED ORDER — DEXAMETHASONE SODIUM PHOSPHATE 4 MG/ML IJ SOLN
INTRAMUSCULAR | Status: DC | PRN
  Administered 2021-07-24: 4 mg via INTRAVENOUS

## 2021-07-24 MED ORDER — GABAPENTIN 300 MG PO CAPS
300.0000 mg | ORAL_CAPSULE | ORAL | Status: AC
  Administered 2021-07-24: 300 mg via ORAL

## 2021-07-24 MED ORDER — 0.9 % SODIUM CHLORIDE (POUR BTL) OPTIME
TOPICAL | Status: DC | PRN
  Administered 2021-07-24: 50 mL

## 2021-07-24 MED ORDER — MIDAZOLAM HCL 2 MG/2ML IJ SOLN
2.0000 mg | Freq: Once | INTRAMUSCULAR | Status: AC
  Administered 2021-07-24: 2 mg via INTRAVENOUS

## 2021-07-24 MED ORDER — EPHEDRINE 5 MG/ML INJ
INTRAVENOUS | Status: AC
  Filled 2021-07-24: qty 5

## 2021-07-24 MED ORDER — CEFAZOLIN SODIUM-DEXTROSE 2-4 GM/100ML-% IV SOLN
INTRAVENOUS | Status: AC
  Filled 2021-07-24: qty 100

## 2021-07-24 MED ORDER — CEFAZOLIN SODIUM-DEXTROSE 2-4 GM/100ML-% IV SOLN
2.0000 g | INTRAVENOUS | Status: AC
  Administered 2021-07-24: 2 g via INTRAVENOUS

## 2021-07-24 MED ORDER — TECHNETIUM TC 99M TILMANOCEPT KIT
1.1000 | PACK | Freq: Once | INTRAVENOUS | Status: AC | PRN
  Administered 2021-07-24: 1.1 via INTRADERMAL

## 2021-07-24 MED ORDER — EPHEDRINE SULFATE 50 MG/ML IJ SOLN
INTRAMUSCULAR | Status: DC | PRN
  Administered 2021-07-24: 10 mg via INTRAVENOUS
  Administered 2021-07-24 (×3): 5 mg via INTRAVENOUS

## 2021-07-24 MED ORDER — TRAMADOL HCL 50 MG PO TABS: 50.0000 mg | ORAL_TABLET | Freq: Four times a day (QID) | ORAL | 0 refills | Status: DC | PRN

## 2021-07-24 MED ORDER — AMISULPRIDE (ANTIEMETIC) 5 MG/2ML IV SOLN: 10.0000 mg | Freq: Once | INTRAVENOUS | Status: DC | PRN

## 2021-07-24 MED ORDER — PROPOFOL 10 MG/ML IV BOLUS
INTRAVENOUS | Status: AC
  Filled 2021-07-24: qty 20

## 2021-07-24 MED ORDER — GABAPENTIN 300 MG PO CAPS
ORAL_CAPSULE | ORAL | Status: AC
  Filled 2021-07-24: qty 1

## 2021-07-24 MED ORDER — ROPIVACAINE HCL 5 MG/ML IJ SOLN
INTRAMUSCULAR | Status: DC | PRN
  Administered 2021-07-24: 30 mL via PERINEURAL

## 2021-07-24 MED ORDER — FENTANYL CITRATE (PF) 100 MCG/2ML IJ SOLN
50.0000 ug | Freq: Once | INTRAMUSCULAR | Status: AC
  Administered 2021-07-24: 50 ug via INTRAVENOUS

## 2021-07-24 MED ORDER — LACTATED RINGERS IV SOLN: INTRAVENOUS | Status: DC

## 2021-07-24 SURGICAL SUPPLY — 46 items
ADH SKN CLS APL DERMABOND .7 (GAUZE/BANDAGES/DRESSINGS) ×1
APL PRP STRL LF DISP 70% ISPRP (MISCELLANEOUS) ×1
APPLIER CLIP 9.375 MED OPEN (MISCELLANEOUS) ×4
APR CLP MED 9.3 20 MLT OPN (MISCELLANEOUS) ×2
BLADE SURG 15 STRL LF DISP TIS (BLADE) ×1 IMPLANT
BLADE SURG 15 STRL SS (BLADE) ×2
CANISTER SUC SOCK COL 7IN (MISCELLANEOUS) IMPLANT
CANISTER SUCT 1200ML W/VALVE (MISCELLANEOUS) ×2 IMPLANT
CHLORAPREP W/TINT 26 (MISCELLANEOUS) ×2 IMPLANT
CLIP APPLIE 9.375 MED OPEN (MISCELLANEOUS) ×2 IMPLANT
COVER BACK TABLE 60X90IN (DRAPES) ×2 IMPLANT
COVER MAYO STAND STRL (DRAPES) ×2 IMPLANT
COVER PROBE W GEL 5X96 (DRAPES) ×2 IMPLANT
DECANTER SPIKE VIAL GLASS SM (MISCELLANEOUS) IMPLANT
DERMABOND ADVANCED (GAUZE/BANDAGES/DRESSINGS) ×1
DERMABOND ADVANCED .7 DNX12 (GAUZE/BANDAGES/DRESSINGS) ×1 IMPLANT
DRAPE LAPAROSCOPIC ABDOMINAL (DRAPES) ×2 IMPLANT
DRAPE UTILITY XL STRL (DRAPES) ×2 IMPLANT
ELECT COATED BLADE 2.86 ST (ELECTRODE) ×2 IMPLANT
ELECT REM PT RETURN 9FT ADLT (ELECTROSURGICAL) ×2
ELECTRODE REM PT RTRN 9FT ADLT (ELECTROSURGICAL) ×1 IMPLANT
GLOVE SURG ENC MOIS LTX SZ7.5 (GLOVE) ×2 IMPLANT
GLOVE SURG POLYISO LF SZ6.5 (GLOVE) ×2 IMPLANT
GLOVE SURG UNDER POLY LF SZ7 (GLOVE) ×2 IMPLANT
GOWN STRL REUS W/ TWL LRG LVL3 (GOWN DISPOSABLE) ×3 IMPLANT
GOWN STRL REUS W/TWL LRG LVL3 (GOWN DISPOSABLE) ×6
ILLUMINATOR WAVEGUIDE N/F (MISCELLANEOUS) IMPLANT
KIT MARKER MARGIN INK (KITS) ×2 IMPLANT
LIGHT WAVEGUIDE WIDE FLAT (MISCELLANEOUS) IMPLANT
NDL SAFETY ECLIPSE 18X1.5 (NEEDLE) IMPLANT
NEEDLE HYPO 18GX1.5 SHARP (NEEDLE)
NEEDLE HYPO 25X1 1.5 SAFETY (NEEDLE) ×2 IMPLANT
NS IRRIG 1000ML POUR BTL (IV SOLUTION) ×2 IMPLANT
PACK BASIN DAY SURGERY FS (CUSTOM PROCEDURE TRAY) ×2 IMPLANT
PENCIL SMOKE EVACUATOR (MISCELLANEOUS) ×2 IMPLANT
SLEEVE SCD COMPRESS KNEE MED (STOCKING) ×2 IMPLANT
SPONGE T-LAP 18X18 ~~LOC~~+RFID (SPONGE) ×2 IMPLANT
SUT MON AB 4-0 PC3 18 (SUTURE) ×4 IMPLANT
SUT SILK 2 0 SH (SUTURE) IMPLANT
SUT VICRYL 3-0 CR8 SH (SUTURE) ×2 IMPLANT
SYR CONTROL 10ML LL (SYRINGE) ×2 IMPLANT
TOWEL GREEN STERILE FF (TOWEL DISPOSABLE) ×2 IMPLANT
TRACER MAGTRACE VIAL (MISCELLANEOUS) IMPLANT
TRAY FAXITRON CT DISP (TRAY / TRAY PROCEDURE) ×2 IMPLANT
TUBE CONNECTING 20X1/4 (TUBING) ×2 IMPLANT
YANKAUER SUCT BULB TIP NO VENT (SUCTIONS) ×2 IMPLANT

## 2021-07-24 NOTE — Transfer of Care (Signed)
Immediate Anesthesia Transfer of Care Note  Patient: April Ross  Procedure(s) Performed: RIGHT BREAST LUMPECTOMY WITH RADIOACTIVE SEED AND SENTINEL LYMPH NODE BIOPSY (Right: Breast)  Patient Location: PACU  Anesthesia Type:General and Regional  Level of Consciousness: drowsy  Airway & Oxygen Therapy: Patient Spontanous Breathing and Patient connected to face mask oxygen  Post-op Assessment: Report given to RN and Post -op Vital signs reviewed and stable  Post vital signs: Reviewed and stable  Last Vitals:  Vitals Value Taken Time  BP 139/62 07/24/21 1253  Temp    Pulse 75 07/24/21 1253  Resp 16 07/24/21 1253  SpO2 98 % 07/24/21 1253  Vitals shown include unvalidated device data.  Last Pain:  Vitals:   07/24/21 0952  TempSrc: Oral  PainSc: 4       Patients Stated Pain Goal: 3 (65/82/60 8883)  Complications: No notable events documented.

## 2021-07-24 NOTE — Progress Notes (Signed)
Assisted Dr. Miller with right, ultrasound guided, pectoralis block. Side rails up, monitors on throughout procedure. See vital signs in flow sheet. Tolerated Procedure well. 

## 2021-07-24 NOTE — Interval H&P Note (Signed)
History and Physical Interval Note:  07/24/2021 11:12 AM  April Ross  has presented today for surgery, with the diagnosis of RIGHT BREAST CANCER.  The various methods of treatment have been discussed with the patient and family. After consideration of risks, benefits and other options for treatment, the patient has consented to  Procedure(s): RIGHT BREAST LUMPECTOMY WITH RADIOACTIVE SEED AND SENTINEL LYMPH NODE BIOPSY (Right) as a surgical intervention.  The patient's history has been reviewed, patient examined, no change in status, stable for surgery.  I have reviewed the patient's chart and labs.  Questions were answered to the patient's satisfaction.     [name]

## 2021-07-24 NOTE — Anesthesia Preprocedure Evaluation (Signed)
Anesthesia Evaluation  Patient identified by MRN, date of birth, ID band Patient awake    Reviewed: Allergy & Precautions, NPO status , Patient's Chart, lab work & pertinent test results  Airway Mallampati: II  TM Distance: >3 FB Neck ROM: Full    Dental no notable dental hx.    Pulmonary neg pulmonary ROS, Current Smoker and Patient abstained from smoking.,    Pulmonary exam normal breath sounds clear to auscultation       Cardiovascular hypertension, Pt. on medications negative cardio ROS Normal cardiovascular exam Rhythm:Regular Rate:Normal     Neuro/Psych Depression negative neurological ROS  negative psych ROS   GI/Hepatic Neg liver ROS, GERD  ,  Endo/Other  diabetes, Type 2Hypothyroidism   Renal/GU Renal InsufficiencyRenal disease  negative genitourinary   Musculoskeletal  (+) Arthritis , Osteoarthritis,    Abdominal   Peds negative pediatric ROS (+)  Hematology negative hematology ROS (+)   Anesthesia Other Findings   Reproductive/Obstetrics negative OB ROS                             Anesthesia Physical Anesthesia Plan  ASA: 3  Anesthesia Plan: General   Post-op Pain Management:  Regional for Post-op pain   Induction: Intravenous  PONV Risk Score and Plan: 2 and Ondansetron, Midazolam and Treatment may vary due to age or medical condition  Airway Management Planned: LMA  Additional Equipment:   Intra-op Plan:   Post-operative Plan: Extubation in OR  Informed Consent: I have reviewed the patients History and Physical, chart, labs and discussed the procedure including the risks, benefits and alternatives for the proposed anesthesia with the patient or authorized representative who has indicated his/her understanding and acceptance.     Dental advisory given  Plan Discussed with: CRNA  Anesthesia Plan Comments:         Anesthesia Quick Evaluation

## 2021-07-24 NOTE — Discharge Instructions (Signed)

## 2021-07-24 NOTE — Anesthesia Postprocedure Evaluation (Signed)
Anesthesia Post Note  Patient: April Ross  Procedure(s) Performed: RIGHT BREAST LUMPECTOMY WITH RADIOACTIVE SEED AND SENTINEL LYMPH NODE BIOPSY (Right: Breast)     Patient location during evaluation: PACU Anesthesia Type: General Level of consciousness: awake and alert Pain management: pain level controlled Vital Signs Assessment: post-procedure vital signs reviewed and stable Respiratory status: spontaneous breathing, nonlabored ventilation and respiratory function stable Cardiovascular status: blood pressure returned to baseline and stable Postop Assessment: no apparent nausea or vomiting Anesthetic complications: no   No notable events documented.  Last Vitals:  Vitals:   07/24/21 1400 07/24/21 1410  BP: (!) 143/63   Pulse: 86 71  Resp: 14 12  Temp:    SpO2: 94% 97%    Last Pain:  Vitals:   07/24/21 1420  TempSrc:   PainSc: [place] Evelen Vazguez

## 2021-07-24 NOTE — Anesthesia Procedure Notes (Signed)
Procedure Name: LMA Insertion Date/Time: 07/24/2021 11:35 AM Performed by: Ezequiel Kayser, CRNA Pre-anesthesia Checklist: Patient identified, Emergency Drugs available, Suction available and Patient being monitored Patient Re-evaluated:Patient Re-evaluated prior to induction Oxygen Delivery Method: Circle System Utilized Preoxygenation: Pre-oxygenation with 100% oxygen Induction Type: IV induction Ventilation: Mask ventilation without difficulty LMA: LMA inserted LMA Size: 3.0 Number of attempts: 1 Airway Equipment and Method: Bite block Placement Confirmation: positive ETCO2 Tube secured with: Tape Dental Injury: Teeth and Oropharynx as per pre-operative assessment

## 2021-07-24 NOTE — H&P (Signed)
REFERRING PHYSICIAN: Aneta Mins,*  PROVIDER: Landry Corporal, MD  MRN: [id number] DOB: 03/21/1951  Subjective   Chief Complaint: No chief complaint on file.   History of Present Illness: April Ross is a 70 y.o. female who is seen today as an office consultation at the request of Dr. Diona Browner for evaluation of No chief complaint on file. .   We are asked to see the patient in consultation by Dr. Diona Browner to evaluate her for a new right breast cancer. The patient is a 70 year old white female who recently went for a routine screening mammogram. At that time she was found to have a 1 cm area of distortion in the upper portion of the right breast with clinically normal looking lymph nodes. 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 The HER2 has not been reported yet. Her only family history of cancer is lung cancer in a maternal aunt. She does smoke about a half a pack of cigarettes a day.  Review of Systems: A complete review of systems was obtained from the patient. I have reviewed this information and discussed as appropriate with the patient. See HPI as well for other ROS.  ROS   Medical History: Past Medical History:  Diagnosis Date   Arthritis   Chronic kidney disease   Diabetes mellitus without complication (CMS-HCC)   GERD (gastroesophageal reflux disease)   Hyperlipidemia   Hypertension   Liver disease   Thyroid disease   Patient Active Problem List  Diagnosis   Malignant neoplasm of upper-outer quadrant of right breast in female, estrogen receptor positive (CMS-HCC)   Past Surgical History:  Procedure Laterality Date   APPENDECTOMY   HYSTERECTOMY    Allergies  Allergen Reactions   Acetaminophen [place]  Stolze   Aspirin [place]  [place] groin and axilla area   Ibuprofen [place]   Current Outpatient Medications on File Prior to Visit  Medication Sig Dispense Refill   alendronate  (FOSAMAX) 70 MG tablet TAKE 1 TABLET BY MOUTH EVERY 7 DAYS WITH A FULL GLASS OF WATER ON AN EMPTY STOMACH   amLODIPine (NORVASC) 5 MG tablet Take 5 mg by mouth once daily   cetirizine (ZYRTEC) 10 MG tablet Take by mouth   cholecalciferol, vitamin D3, (CHOLECALCIFEROL, VIT D3,,BULK,) 100,000 unit/gram Powd Take by mouth   cinnamon bark, bulk, Powd Take 1 tablet by mouth once daily   dulaglutide (TRULICITY) 0.34 VQ/2.5 mL subcutaneous pen injector Inject subcutaneously   fluticasone propionate (FLONASE) 50 mcg/actuation nasal spray Place into one nostril   levothyroxine (SYNTHROID) 88 MCG tablet Take 88 mcg by mouth once daily   losartan (COZAAR) 100 MG tablet Take 100 mg by mouth once daily   metFORMIN (GLUCOPHAGE) 500 MG tablet TAKE 1 TABLET BY MOUTH 2 TIMES DAILY WITH A MEAL.   omega-3/epa/fish oil (OMEGA-3 FATTY ACIDS-EPA ORAL) Take 1 capsule by mouth once daily   omeprazole (PRILOSEC) 20 MG DR capsule Take 20 mg by mouth once daily   rosuvastatin (CRESTOR) 20 MG tablet Take 20 mg by mouth once daily   tiZANidine (ZANAFLEX) 4 MG tablet TAKE 1 TABLET BY MOUTH AT BEDTIME AS NEEDED FOR MUSCLE SPASMS.   TURMERIC ORAL Take 1 tablet by mouth once daily   No current facility-administered medications on file prior to visit.   Family History  Problem Relation Age of Onset   Hyperlipidemia (Elevated cholesterol) Mother   High blood pressure (  Hypertension) Mother   Diabetes Mother    Social History   Tobacco Use  Smoking Status Current Every Day Smoker  Smokeless Tobacco Never Used    Social History   Socioeconomic History   Marital status: Widowed  Tobacco Use   Smoking status: Current Every Day Smoker   Smokeless tobacco: Never Used  Substance and Sexual Activity   Alcohol use: Defer   Drug use: Defer   Objective:   Vitals:   BP: (!) 142/82  Pulse: 81  SpO2: 96%  Weight: 67.6 kg (149 lb)  Height: 157.5 cm (5' 2" )   Body mass index is 27.25 kg/m.  Physical Exam Vitals  reviewed.  Constitutional:  General: She is not in acute distress. Appearance: Normal appearance.  HENT:  Head: Normocephalic and atraumatic.  Right Ear: External ear normal.  Left Ear: External ear normal.  Nose: Nose normal.  Mouth/Throat:  Mouth: Mucous membranes are moist.  Pharynx: Oropharynx is clear.  Eyes:  General: No scleral icterus. Extraocular Movements: Extraocular movements intact.  Conjunctiva/sclera: Conjunctivae normal.  Pupils: Pupils are equal, round, and reactive to light.  Cardiovascular:  Rate and Rhythm: Normal rate and regular rhythm.  Pulses: Normal pulses.  Heart sounds: Normal heart sounds.  Pulmonary:  Effort: Pulmonary effort is normal. No respiratory distress.  Breath sounds: Normal breath sounds.  Abdominal:  General: Bowel sounds are normal.  Palpations: Abdomen is soft.  Tenderness: There is no abdominal tenderness.  Musculoskeletal:  General: No swelling, tenderness or deformity. Normal range of motion.  Cervical back: Normal range of motion and neck supple.  Skin: General: Skin is warm and dry.  Coloration: Skin is not jaundiced.  Neurological:  General: No focal deficit present.  Mental Status: She is alert and oriented to person, place, and time.  Psychiatric:  Mood and Affect: Mood normal.  Behavior: Behavior normal.   Breast: There is a small palpable bruise in the upper portion of the right breast. Other than this there is no palpable mass in either breast. There is no palpable axillary, supraclavicular, or cervical lymphadenopathy.  Labs, Imaging and Diagnostic Testing:  Assessment and Plan:  Diagnoses and all orders for this visit:  Malignant neoplasm of upper-outer quadrant of right breast in female, estrogen receptor positive (CMS-HCC)    The patient appears to have a small stage I lobular cancer in the upper portion of the right breast. I have discussed with her in detail the different options for treatment. At this  point she is undecided between lumpectomy versus mastectomy. I will go ahead and refer her to plastic surgery to talk about the options for reconstruction. I will also make a referral to medical and radiation oncology to discuss adjuvant therapy. Because the cancer is lobular nature we will also evaluate her with MRI. We will call her with the results of the study and then she will let us know how she would like to proceed. I have discussed with her in detail the risks and benefits of the operations as well as some of the technical aspects and she understands. She has decided on breast conservation

## 2021-07-24 NOTE — Anesthesia Procedure Notes (Signed)
Anesthesia Regional Block: Pectoralis block   Pre-Anesthetic Checklist: , timeout performed,  Correct Patient, Correct Site, Correct Laterality,  Correct Procedure, Correct Position, site marked,  Risks and benefits discussed,  Surgical consent,  Pre-op evaluation,  At surgeon's request and post-op pain management  Laterality: Right  Prep: chloraprep       Needles:  Injection technique: Single-shot  Needle Type: Stimiplex     Needle Length: 9cm  Needle Gauge: 21     Additional Needles:   Procedures:,,,, ultrasound used (permanent image in chart),,    Narrative:  Start time: 07/24/2021 10:40 AM End time: 07/24/2021 10:45 AM Injection made incrementally with aspirations every 5 mL.  Performed by: Personally  Anesthesiologist: Lynda Rainwater, MD

## 2021-07-24 NOTE — Progress Notes (Signed)
Nuc Med present at bedside injecting for sentinel node biopsy. Patient tolerated well, VSS.

## 2021-07-24 NOTE — Op Note (Signed)
07/24/2021  12:49 PM  PATIENT:  April Ross  70 y.o. female  PRE-OPERATIVE DIAGNOSIS:  RIGHT BREAST CANCER  POST-OPERATIVE DIAGNOSIS:  RIGHT BREAST CANCER  PROCEDURE:  Procedure(s): RIGHT BREAST LUMPECTOMY WITH RADIOACTIVE SEED LOCALIZATION AND DEEP RIGHT AXILLARY SENTINEL LYMPH NODE BIOPSY (Right)  SURGEON:  Surgeon(s) and Role:    * Jovita Kussmaul, MD - Primary  PHYSICIAN ASSISTANT:   ASSISTANTS: none   ANESTHESIA:   local and general  EBL:  5 mL   BLOOD ADMINISTERED:none  DRAINS: none   LOCAL MEDICATIONS USED:  MARCAINE     SPECIMEN:  Source of Specimen:  right breast tissue and sentinel nodes x 3  DISPOSITION OF SPECIMEN:  PATHOLOGY  COUNTS:  YES  TOURNIQUET:  * No tourniquets in log *  DICTATION: .Dragon Dictation  After informed consent was obtained the patient was brought to the operating room and placed in the supine position on the operating table.  After adequate induction of general anesthesia the patient's right chest, breast, and axillary area were prepped with ChloraPrep, allowed to dry, and draped in usual sterile manner.  An appropriate timeout was performed.  Previously an I-125 seed was placed in the upper portion of the right breast to mark an area of invasive breast cancer.  Also earlier in the day the patient underwent injection of 1 mCi of technetium sulfur colloid in the subareolar position on the right.  The neoprobe was initially set to technetium and there was a good signal in the right axilla.  This area was infiltrated with quarter percent Marcaine.  A small transversely oriented incision was made with a 15 blade knife overlying the area of radioactivity.  The incision was carried through the skin and subcutaneous tissue sharply with the electrocautery until the deep right axillary space was entered.  The neoprobe was then used to direct blunt hemostat dissection.  I was able to identify 3 hot lymph nodes.  Each of these nodes was excised  sharply with the electrocautery and the surrounding small vessels and lymphatics were controlled with clips.  Ex vivo counts on these nodes ranged from 200 to 2000.  No other hot or palpable nodes were identified in the right axilla.  Hemostasis was achieved using the Bovie electrocautery.  The deep layer of the incision was closed with interrupted 3-0 Vicryl stitches.  The skin was closed with a running 4-0 Monocryl subcuticular stitch.  Attention was then turned to the right breast.  The neoprobe was set to I-125 in the area of radioactivity was readily identified very high in the upper right breast.  Because of its location and because of the superficial nature of the seed to the skin I elected to make an elliptical incision in the skin overlying the area of radioactivity with a 15 blade knife.  The incision was carried through the skin and subcutaneous tissue sharply with the electrocautery.  Dissection was then carried widely around the radioactive seed with the electrocautery while checking the area of radioactivity frequently.  This dissection was carried all the way to the muscle of the chest wall.  Once the specimen was removed it was oriented with the appropriate paint colors.  A specimen radiograph was obtained that showed the clip and seed to be near the center of the specimen.  The specimen was then sent to pathology for further evaluation.  Hemostasis was achieved using the Bovie electrocautery.  The wound was irrigated with saline and infiltrated with more quarter percent Marcaine.  The deep layer of the wound was closed with interrupted 3-0 Vicryl stitches.  The skin was then closed with a running 4-0 Monocryl subcuticular stitch.  Dermabond dressings were applied.  The patient tolerated the procedure well.  At the end of the case all needle sponge and instrument counts were correct.  The patient was then awakened and taken recovery in stable condition.  PLAN OF CARE: Discharge to home after  PACU  PATIENT DISPOSITION:  PACU - hemodynamically stable.   Delay start of Pharmacological VTE agent (>24hrs) due to surgical blood loss or risk of bleeding: not applicable

## 2021-07-25 ENCOUNTER — Encounter (HOSPITAL_BASED_OUTPATIENT_CLINIC_OR_DEPARTMENT_OTHER): Payer: Self-pay | Admitting: General Surgery

## 2021-07-25 DIAGNOSIS — G44229 Chronic tension-type headache, not intractable: Secondary | ICD-10-CM | POA: Insufficient documentation

## 2021-07-25 NOTE — Assessment & Plan Note (Signed)
Worsened, chronic  Using tramadol prn headache. Discussed limiting medication.  Stress reduction, relaxation, massage, keep up with fluids.

## 2021-07-25 NOTE — Assessment & Plan Note (Addendum)
Moderate control, worsened with dx of breast cancer  Not interested in medication at this time.

## 2021-07-25 NOTE — Assessment & Plan Note (Signed)
Followed by opthamology.

## 2021-07-26 LAB — SURGICAL PATHOLOGY

## 2021-08-10 ENCOUNTER — Telehealth: Payer: Self-pay | Admitting: *Deleted

## 2021-08-10 ENCOUNTER — Other Ambulatory Visit: Payer: Self-pay | Admitting: Family Medicine

## 2021-08-10 NOTE — Telephone Encounter (Signed)
RN received msg from [employer]. Pt being referred back s/p surgery - mastectomy with lymph node disection. Pt will need follow-up in cancer center with Dr. B    Dr. B wants to see patient on Monday 11/7 at 1pm.  I reached out to the patient. 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 She has to have early am apts- prefers 8 am apts. I didn't have any apts available until 11/18. She stated that she had to be back in [place] every day by 1015 to care for her uncle.  I told the patient that if we had any other openings come available, I would let her know. She insisted that Dr. Marlou Starks told her that she wouldn't need any chemo, just radiation.

## 2021-08-10 NOTE — Telephone Encounter (Signed)
Last office visit 06/15/21 for DM & HA.  Last refilled 08/09/20222 for #90 with no refills.  CPE scheduled 12/15/2021.

## 2021-08-16 ENCOUNTER — Encounter: Payer: Self-pay | Admitting: Rehabilitation

## 2021-08-16 ENCOUNTER — Ambulatory Visit: Payer: PPO | Attending: General Surgery | Admitting: Rehabilitation

## 2021-08-16 ENCOUNTER — Other Ambulatory Visit: Payer: Self-pay

## 2021-08-16 DIAGNOSIS — C50211 Malignant neoplasm of upper-inner quadrant of right female breast: Secondary | ICD-10-CM | POA: Insufficient documentation

## 2021-08-16 DIAGNOSIS — Z170 Estrogen receptor positive status [ER+]: Secondary | ICD-10-CM | POA: Insufficient documentation

## 2021-08-16 DIAGNOSIS — R293 Abnormal posture: Secondary | ICD-10-CM | POA: Insufficient documentation

## 2021-08-16 DIAGNOSIS — Z483 Aftercare following surgery for neoplasm: Secondary | ICD-10-CM | POA: Insufficient documentation

## 2021-08-16 NOTE — Patient Instructions (Addendum)
     [facility]  [address]  [place] [address]  [phone]   Scar massage - start around 4 weeks or after glue is gone   Compression garment - bras at [employer].  We will give you a compression sleeve if needed during SOZO screening.    Home exercise Program - per pictures   Follow up PT: It is recommended you return every 3 months for the first 3 years following surgery to be assessed on the SOZO machine for an L-Dex score. This helps prevent clinically significant lymphedema in 95% of patients. These follow up screens are 10 minute appointments that you are not billed for.

## 2021-08-16 NOTE — Therapy (Signed)
[facility] @ [place], [place], [address] Phone: [phone]   Fax:  [phone]  Physical Therapy Treatment  Patient Details  Name: April Ross MRN: [id number] Date of Birth: 02/03/1951 Referring Provider (PT): Dr. Marlou Starks   Encounter Date: 08/16/2021   PT End of Session - 08/16/21 0943     Visit Number 2    Number of Visits 6    Date for PT Re-Evaluation 10/11/21    PT Start Time 0850    PT Stop Time 0930    PT Time Calculation (min) 40 min    Activity Tolerance Patient tolerated treatment well    Behavior During Therapy [facility] for tasks assessed/performed             Past Medical History:  Diagnosis Date   Allergy    Arthritis    CRD (chronic renal disease)    stage 3   Depression    Diabetes mellitus without complication (HCC)    GERD (gastroesophageal reflux disease)    Hyperlipidemia    Hypertension    LBBB (left bundle branch block)    a. diagnosed in 10/2017 - echo showed a preserved EF of 55-60% and NST was low-risk showing no evidence of ischemia.    Osteoporosis    polyps in colon    Thyroid disease    Urinary tract infection     Past Surgical History:  Procedure Laterality Date   ABDOMINAL HYSTERECTOMY     APPENDECTOMY     BREAST LUMPECTOMY WITH RADIOACTIVE SEED AND SENTINEL LYMPH NODE BIOPSY Right 07/24/2021   Procedure: RIGHT BREAST LUMPECTOMY WITH RADIOACTIVE SEED AND SENTINEL LYMPH NODE BIOPSY;  Surgeon: Jovita Kussmaul, MD;  Location: [facility];  Service: General;  Laterality: Right;    There were no vitals filed for this visit.   Subjective Assessment - 08/16/21 0848     Subjective It is really sore after lymph node removal.  It is burning and tingling and numb. I maybe have a small swelling pocket.    Pertinent History Pt will be having a Rt lumpectomy with 2/11 lymph nodes positive. Will see oncologist tomorrow for treatment plan.    other history  includes: DM, stage 3 CKD, osteoporosis    Currently in Pain? Yes    Pain Score 2     Pain Location Axilla    Pain Orientation Right    Pain Descriptors / Indicators Aching;Burning    Pain Type Surgical pain    Pain Onset 1 to 4 weeks ago    Pain Frequency Constant    Aggravating Factors  movement    Pain Relieving Factors ice, medication, rest    Effect of Pain on Daily Activities It is improving daily                [facility] PT Assessment - 08/16/21 0001       Assessment   Medical Diagnosis Rt breast cancer    Referring Provider (PT) Dr. Marlou Starks    Onset Date/Surgical Date 07/24/21    Hand Dominance Right    Prior Therapy no      Precautions   Precaution Comments active cancer      Restrictions   Weight Bearing Restrictions No      Balance Screen   Has the patient fallen in the past 6 months No    Has the patient had a decrease in activity level because of a fear of falling?  No    Is the patient reluctant to leave their home because of a fear of falling?  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  Well healing axillray and breast incision with glue still present      AROM   Right Shoulder Flexion 147 Degrees   small cord tender and noticeable in the axilla   Right Shoulder ABduction 150 Degrees   pull in axilla     Palpation   Palpation comment 2 cords noted in axilla with overhead flexion + abduction, small sermona in axilla                           [facility] Adult PT Treatment/Exercise - 08/16/21 0001        Self-Care   Other Self-Care Comments  education on lymphedema risk reduction as pt is unable to do ABC class.  Gave info on second to nature and pt will get prescription tomorrow at oncology appt.  updated HEP to include supine flexion AAROM and behind the head and median nerve cording stretch and Y position with handout, information on scar massage, and use of small pillow for axillary compression                     PT Education - 08/16/21 0943     Education Details education on lymphedema risk reduction as pt is unable to do ABC class.  Gave info on second to nature and pt will get prescription tomorrow at oncology appt.  updated HEP to include supine flexion AAROM and behind the head and median nerve cording stretch and Y position with handout, information on scar massage, and use of small pillow for axillary compression    Person(s) Educated Patient    Methods Explanation;Demonstration;Verbal cues;Handout    Comprehension Verbalized understanding                 PT Long Term Goals - 08/16/21 0946       PT LONG TERM GOAL #1   Title Pt will return to baseline AROM    Status Partially Met      PT LONG TERM GOAL #2   Title Pt will decrease resting axillary pain to 0/10    Baseline 2-3/10    Time 8    Period Weeks    Status New                   Plan - 08/16/21 0943     Clinical Impression Statement Pt returns 3 weeks post Rt lumpectomy and removal of 11 LNs.  Further treatment will be decided tomorrow.  Pt is doing well regaining AROM but is limited by nerve like pain in the axilla and tricep region.  Also has 2 axillary cords that are present and seem to be limiting the end range motion.  Education on lymphedema risk reduction as pt is unable to do ABC class.  Gave info on second to nature and pt will get prescription tomorrow at oncology appt.  updated HEP to include supine flexion AAROM and behind the head and median nerve cording stretch and Y  position with handout, information on scar massage, and use of small pillow for axillary compression.  Pt will return for a few sessions for cording release, Rt axillary decongestion/MLD, and SOZO surveillance    PT Duration 8 weeks    PT Treatment/Interventions ADLs/Self Care Home Management;Patient/family education;Therapeutic exercise;Manual techniques    PT Next Visit Plan recheck AROM, how are cords and new stretches, PROM/pulleys/cording release    PT Home Exercise Plan Access Code: [email or web address] and post op supine    Recommended Other Services bras    Consulted and Agree with Plan of Care Patient             Patient will benefit from skilled therapeutic intervention in order to improve the following deficits and impairments:     Visit Diagnosis: Carcinoma of upper-inner quadrant of right breast in female, estrogen receptor positive ([place])  Aftercare following surgery for neoplasm     Problem List Patient Active Problem List   Diagnosis Date Noted   Chronic tension-type headache, not intractable 07/25/2021   Carcinoma of upper-inner quadrant of right breast in female, estrogen receptor positive ([facility]) 06/15/2021   Diabetic retinopathy ([facility]) 11/24/2020   ETD (Eustachian tube dysfunction), right 08/04/2019   Chronic bilateral thoracic back pain 11/21/2018   Type 2 diabetes mellitus with nephropathy ([facility] [place]) 10/27/2017   Tobacco abuse 10/27/2017   Nonspecific chest pain    Dizziness 05/16/2017   Osteoporosis 05/24/2016   CKD (chronic kidney disease) stage 3, GFR 30-59 ml/min ([facility]) 05/08/2016   Hyperlipidemia associated with type 2 diabetes mellitus ([facility]) 01/12/2016   Hypothyroidism 01/12/2016   Hypertension associated with diabetes ([facility]) 01/12/2016   Allergic rhinitis 01/12/2016   Moderate major depression, single episode ([facility]) 01/12/2016   Chronic insomnia 01/12/2016   GERD (gastroesophageal reflux disease) 01/12/2016    Stark Bray, PT 08/16/2021, 9:47 AM  [facility] @ [place], [place], [address] Phone: [phone]   Fax:  [phone]  Name: April Ross MRN: [id number] Date of Birth: 08/25/1951

## 2021-08-17 ENCOUNTER — Ambulatory Visit
Admission: RE | Admit: 2021-08-17 | Discharge: 2021-08-17 | Disposition: A | Discharge status: Home / Self Care | Payer: PPO | Source: Ambulatory Visit | Attending: Radiation Oncology | Admitting: Radiation Oncology

## 2021-08-17 ENCOUNTER — Encounter: Payer: Self-pay | Admitting: Radiation Oncology

## 2021-08-17 VITALS — BP 158/76 | HR 70 | Resp 18 | Ht 62.0 in | Wt 150.5 lb

## 2021-08-17 DIAGNOSIS — M129 Arthropathy, unspecified: Secondary | ICD-10-CM | POA: Diagnosis not present

## 2021-08-17 DIAGNOSIS — E785 Hyperlipidemia, unspecified: Secondary | ICD-10-CM | POA: Insufficient documentation

## 2021-08-17 DIAGNOSIS — N189 Chronic kidney disease, unspecified: Secondary | ICD-10-CM | POA: Diagnosis not present

## 2021-08-17 DIAGNOSIS — E079 Disorder of thyroid, unspecified: Secondary | ICD-10-CM | POA: Insufficient documentation

## 2021-08-17 DIAGNOSIS — Z7984 Long term (current) use of oral hypoglycemic drugs: Secondary | ICD-10-CM | POA: Insufficient documentation

## 2021-08-17 DIAGNOSIS — I129 Hypertensive chronic kidney disease with stage 1 through stage 4 chronic kidney disease, or unspecified chronic kidney disease: Secondary | ICD-10-CM | POA: Diagnosis not present

## 2021-08-17 DIAGNOSIS — C50211 Malignant neoplasm of upper-inner quadrant of right female breast: Secondary | ICD-10-CM | POA: Insufficient documentation

## 2021-08-17 DIAGNOSIS — F17210 Nicotine dependence, cigarettes, uncomplicated: Secondary | ICD-10-CM | POA: Insufficient documentation

## 2021-08-17 DIAGNOSIS — M810 Age-related osteoporosis without current pathological fracture: Secondary | ICD-10-CM | POA: Diagnosis not present

## 2021-08-17 DIAGNOSIS — Z86010 Personal history of colonic polyps: Secondary | ICD-10-CM | POA: Insufficient documentation

## 2021-08-17 DIAGNOSIS — Z79899 Other long term (current) drug therapy: Secondary | ICD-10-CM | POA: Insufficient documentation

## 2021-08-17 DIAGNOSIS — K219 Gastro-esophageal reflux disease without esophagitis: Secondary | ICD-10-CM | POA: Insufficient documentation

## 2021-08-17 DIAGNOSIS — E119 Type 2 diabetes mellitus without complications: Secondary | ICD-10-CM | POA: Insufficient documentation

## 2021-08-17 DIAGNOSIS — Z170 Estrogen receptor positive status [ER+]: Secondary | ICD-10-CM

## 2021-08-17 NOTE — Progress Notes (Signed)
New Breast Cancer Diagnosis: Right Breast UOQ  Did patient present with symptoms (if so, please note symptoms) or screening mammography?:Screening Mass    Location and Extent of disease :right breast. Located in the upper outer quadrant, measured  1 cm in greatest dimension. Adenopathy no.  Histology per Pathology Report: grade 1, Invasive Lobular Carcinoma 07/24/2021  Receptor Status: ER(positive), PR (positive), Her2-neu (negative), Ki-(10%)  Surgeon and surgical plan, if any:  Dr. Marlou Starks  -Right Breast lumpectomy with radioactive seed and SLN biopsy 07/24/2021   Medical oncologist, treatment if any:   Dr. Rogue Bussing 08/25/2021   Family History of Breast/Ovarian/Prostate Cancer:   Lymphedema issues, if any:    Some   Pain issues, if any:   States that she has pain of a 2 in breast area. States that the pain is also under her rt arm.  SAFETY ISSUES: Prior radiation? No Pacemaker/ICD? No Possible current pregnancy? Hysterectomy Is the patient on methotrexate? No  Current Complaints / other details:   Patient states that she is a diabetic and that she takes metformin. Vitals:   08/17/21 0737  BP: (!) 158/76  Pulse: 70  Resp: 18  SpO2: 100%  Weight: 68.3 kg  Height: 5' 2"  (1.575 m)

## 2021-08-18 ENCOUNTER — Encounter: Payer: Self-pay | Admitting: General Practice

## 2021-08-18 NOTE — Progress Notes (Signed)
[facility] Psychosocial Distress Screening Clinical Social Work  Clinical Social Work was referred by distress screening protocol.  The patient scored a 5 on the Psychosocial Distress Thermometer which indicates moderate distress. Clinical Social Worker contacted patient by phone to assess for distress and other psychosocial needs.  She reports no major sources of stress related to her diagnosis, she will meet w medical oncologist next week and determine if she will need chemotherapy - she has already met w radiation oncology and knows the plan.  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  She has no concerns about housing at this time.  Briefly described [facility], will mail information packet so she is aware of all our virtual offerings - she cannot attend in person classes.    [facility] DISTRESS SCREENING 08/17/2021  Screening Type Initial Screening  Distress experienced in past week (1-10) 5  Practical problem type Housing    Clinical Social Worker follow up needed: No.  If yes, follow up plan:  Beverely Pace, [place], LCSW Clinical Social Worker Phone:  [phone]

## 2021-08-22 NOTE — Progress Notes (Signed)
Radiation Oncology         ([phone]) [phone] ________________________________  Name: April Ross MRN: [id number]  Date: 08/17/2021  DOB: Mar 25, 1951  SA:[id number], Mervyn Gay, MD  Jovita Kussmaul, MD     REFERRING PHYSICIAN: [name], MD   DIAGNOSIS: The encounter diagnosis was Carcinoma of upper-inner quadrant of right breast in female, estrogen receptor positive ([place]).   HISTORY OF PRESENT ILLNESS::April Ross is a 70 y.o. female who is seen for an initial consultation visit regarding the patient's diagnosis of right-sided breast cancer.  The patient had suspicious findings within the right breast which has prompted proceeding with a right sided lumpectomy on 07/24/2021.  Surgery appears to have gone very well.  Final pathology revealed a grade 1 invasive lobular carcinoma measuring 2.1 cm.  Margins were negative with the closest margin being 2 mm superiorly.  A total of 2 out of 11 lymph nodes were positive for carcinoma.  Receptor studies indicated that the tumor is estrogen receptor positive, progesterone receptor positive, and HER2/neu negative.  The Ki-67 staining was 10%.  The patient states that she has done very well in terms of recovery since surgery.  She does see medical oncology next week to discuss potential systemic treatment.    PREVIOUS RADIATION THERAPY: No   PAST MEDICAL HISTORY:  has a past medical history of Allergy, Arthritis, CRD (chronic renal disease), Depression, Diabetes mellitus without complication ([place]), GERD (gastroesophageal reflux disease), Hyperlipidemia, Hypertension, LBBB (left bundle branch block), Osteoporosis, polyps in colon, Thyroid disease, and Urinary tract infection.     PAST SURGICAL HISTORY: Past Surgical History:  Procedure Laterality Date   ABDOMINAL HYSTERECTOMY     APPENDECTOMY     BREAST LUMPECTOMY WITH RADIOACTIVE SEED AND SENTINEL LYMPH NODE BIOPSY Right 07/24/2021   Procedure: RIGHT BREAST LUMPECTOMY WITH RADIOACTIVE SEED AND  SENTINEL LYMPH NODE BIOPSY;  Surgeon: Jovita Kussmaul, MD;  Location: [facility];  Service: General;  Laterality: Right;     FAMILY HISTORY: family history includes Alcohol abuse in her maternal grandfather and maternal uncle; Alzheimer's disease in her mother; Arthritis in her maternal uncle; Asthma in her maternal grandmother; Cholecystitis in her maternal uncle; Diabetes in her maternal grandfather and paternal grandmother; Hyperlipidemia in her maternal grandfather and mother; Hypertension in her maternal grandfather and mother; Stroke in her maternal grandfather.   SOCIAL HISTORY:  reports that she has been smoking cigarettes. She has been smoking an average of .5 packs per day. She has never used smokeless tobacco. She reports current alcohol use. She reports that she does not use drugs.   ALLERGIES: Acetaminophen, Asa [aspirin], Ibuprofen, and Other   MEDICATIONS:  Current Outpatient Medications  Medication Sig Dispense Refill   amLODipine (NORVASC) 5 MG tablet Take 1 tablet (5 mg total) by mouth daily. 90 tablet 11   cetirizine (ZYRTEC) 10 MG tablet Take 10 mg by mouth at bedtime as needed for allergies.     CINNAMON PO Take 1 tablet by mouth daily.     fluticasone (FLONASE) 50 MCG/ACT nasal spray Place 2 sprays into both nostrils daily as needed for allergies.     gabapentin (NEURONTIN) 100 MG capsule Take 100 mg by mouth 3 (three) times daily as needed.     glyBURIDE (DIABETA) 5 MG tablet TAKE 1 TABLET (5 MG TOTAL) BY MOUTH 2 (TWO) TIMES DAILY WITH A MEAL. 180 tablet 1   levothyroxine (SYNTHROID) 88 MCG tablet TAKE 1 TABLET BY MOUTH EVERY DAY 90 tablet  3   losartan (COZAAR) 100 MG tablet Take 1 tablet (100 mg total) by mouth daily. 90 tablet 1   metFORMIN (GLUCOPHAGE) 500 MG tablet TAKE 1 TABLET BY MOUTH 2 TIMES DAILY WITH A MEAL. 180 tablet 1   Omega-3 Fatty Acids (FISH OIL OMEGA-3 PO) Take 1 capsule by mouth daily.     omeprazole (PRILOSEC) 20 MG capsule TAKE 1  CAPSULE BY MOUTH EVERY DAY 90 capsule 3   rosuvastatin (CRESTOR) 20 MG tablet TAKE 1 TABLET BY MOUTH EVERY DAY 90 tablet 3   tiZANidine (ZANAFLEX) 4 MG tablet TAKE 1 TABLET BY MOUTH AT BEDTIME AS NEEDED FOR MUSCLE SPASMS. 90 tablet 0   traMADol (ULTRAM) 50 MG tablet Take 1 tablet (50 mg total) by mouth every 6 (six) hours as needed. 15 tablet 0   Turmeric (QC TUMERIC COMPLEX PO) Take 1 tablet by mouth daily.     VITAMIN D PO Take 2 each by mouth daily.     aspirin EC 81 MG tablet Take 81 mg by mouth daily. Swallow whole. (Patient not taking: Reported on 08/17/2021)     No current facility-administered medications for this encounter.     REVIEW OF SYSTEMS:  A 15 point review of systems is documented in the electronic medical record. This was obtained by the nursing staff. However, I reviewed this with the patient to discuss relevant findings and make appropriate changes.  Pertinent items are noted in HPI.    PHYSICAL EXAM:  height is 5' 2"  (1.575 m) and weight is 150 lb 8 oz (68.3 kg). Her blood pressure is 158/76 (abnormal) and her pulse is 70. Her respiration is 18 and oxygen saturation is 100%.   ECOG = 0  0 - Asymptomatic (Fully active, able to carry on all predisease activities without restriction)  1 - Symptomatic but completely ambulatory (Restricted in physically strenuous activity but ambulatory and able to carry out work of a light or sedentary nature. For example, light housework, office work)  2 - Symptomatic, <50% in bed during the day (Ambulatory and capable of all self care but unable to carry out any work activities. Up and about more than 50% of waking hours)  3 - Symptomatic, >50% in bed, but not bedbound (Capable of only limited self-care, confined to bed or chair 50% or more of waking hours)  4 - Bedbound (Completely disabled. Cannot carry on any self-care. Totally confined to bed or chair)  5 - Death   Eustace Pen MM, Creech RH, Tormey DC, et al. [phone]). "Toxicity and  response criteria of the [facility] Group". [place] Oncol. 5 (6): 649-55  Alert, no distress   LABORATORY DATA:  Lab Results  Component Value Date   WBC 8.5 12/01/2020   HGB 12.8 12/01/2020   HCT 38.2 12/01/2020   MCV 83.6 12/01/2020   PLT 240 12/01/2020   Lab Results  Component Value Date   NA 139 07/21/2021   K 4.3 07/21/2021   CL 107 07/21/2021   CO2 24 07/21/2021   Lab Results  Component Value Date   ALT 22 06/09/2021   AST 23 06/09/2021   ALKPHOS 48 06/09/2021   BILITOT 0.4 06/09/2021      RADIOGRAPHY: NM Sentinel Node Inj-No Rpt (Breast)  Result Date: 07/24/2021 Sulfur Colloid was injected by the Nuclear Medicine Technologist for sentinel lymph node localization.   MM Breast Surgical Specimen  Result Date: 07/24/2021 CLINICAL DATA:  Evaluate surgical specimen following lumpectomy for RIGHT breast cancer.  EXAM: SPECIMEN RADIOGRAPH OF THE RIGHT BREAST COMPARISON:  Previous exam(s). FINDINGS: Status post excision of the RIGHT breast. The radioactive seed and X biopsy marker clip are present, completely intact, and were marked for pathology. IMPRESSION: Specimen radiograph of the RIGHT breast. Electronically Signed   By: Margarette Canada M.D.   On: 07/24/2021 12:36      IMPRESSION/ PLAN:  The patient is status post right-sided lumpectomy for a T2 N1 M0 invasive lobular carcinoma of the right breast.  She did have 2 out of 11 lymph nodes positive for carcinoma.  Based on the patient's final information, the patient is appropriate to proceed with adjuvant radiation treatment at the appropriate time.  We would target the right breast as well as the right supraclavicular region.  I discussed this recommendation with the patient.  We discussed the rationale for such a treatment as well as possible side effects and risks.  All of the patient's questions were answered and she does wish to proceed with this treatment at the appropriate time.  The patient sees  medical oncology next week to make a final decision in terms of systemic treatment.  We discussed the timing of radiation in relation to potential chemotherapy.  We will follow-up on this upcoming visit and then proceed accordingly.   The patient was seen in person today in clinic.  The total time spent on the patient's visit today was 50 minutes, including chart review, direct discussion/evaluation with the patient, and coordination of care.        ________________________________   Jodelle Gross, MD, PhD   **Disclaimer: This note was dictated with voice recognition software. Similar sounding words can inadvertently be transcribed and this note may contain transcription errors which may not have been corrected upon publication of note.**

## 2021-08-23 ENCOUNTER — Telehealth: Payer: Self-pay | Admitting: Radiation Oncology

## 2021-08-23 NOTE — Telephone Encounter (Signed)
The patient called back and we discussed she desires treatment closer to home.  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

## 2021-08-25 ENCOUNTER — Other Ambulatory Visit: Payer: Self-pay

## 2021-08-25 ENCOUNTER — Encounter: Payer: Self-pay | Admitting: Internal Medicine

## 2021-08-25 ENCOUNTER — Telehealth: Payer: Self-pay

## 2021-08-25 ENCOUNTER — Inpatient Hospital Stay: Payer: PPO | Attending: Internal Medicine | Admitting: Internal Medicine

## 2021-08-25 DIAGNOSIS — M810 Age-related osteoporosis without current pathological fracture: Secondary | ICD-10-CM | POA: Insufficient documentation

## 2021-08-25 DIAGNOSIS — I129 Hypertensive chronic kidney disease with stage 1 through stage 4 chronic kidney disease, or unspecified chronic kidney disease: Secondary | ICD-10-CM | POA: Insufficient documentation

## 2021-08-25 DIAGNOSIS — F17210 Nicotine dependence, cigarettes, uncomplicated: Secondary | ICD-10-CM | POA: Diagnosis not present

## 2021-08-25 DIAGNOSIS — K219 Gastro-esophageal reflux disease without esophagitis: Secondary | ICD-10-CM | POA: Insufficient documentation

## 2021-08-25 DIAGNOSIS — C773 Secondary and unspecified malignant neoplasm of axilla and upper limb lymph nodes: Secondary | ICD-10-CM | POA: Insufficient documentation

## 2021-08-25 DIAGNOSIS — Z86010 Personal history of colonic polyps: Secondary | ICD-10-CM | POA: Diagnosis not present

## 2021-08-25 DIAGNOSIS — Z7984 Long term (current) use of oral hypoglycemic drugs: Secondary | ICD-10-CM | POA: Diagnosis not present

## 2021-08-25 DIAGNOSIS — Z79899 Other long term (current) drug therapy: Secondary | ICD-10-CM | POA: Diagnosis not present

## 2021-08-25 DIAGNOSIS — Z7982 Long term (current) use of aspirin: Secondary | ICD-10-CM | POA: Insufficient documentation

## 2021-08-25 DIAGNOSIS — E119 Type 2 diabetes mellitus without complications: Secondary | ICD-10-CM | POA: Diagnosis not present

## 2021-08-25 DIAGNOSIS — C50211 Malignant neoplasm of upper-inner quadrant of right female breast: Secondary | ICD-10-CM | POA: Diagnosis not present

## 2021-08-25 DIAGNOSIS — E785 Hyperlipidemia, unspecified: Secondary | ICD-10-CM | POA: Insufficient documentation

## 2021-08-25 DIAGNOSIS — N183 Chronic kidney disease, stage 3 unspecified: Secondary | ICD-10-CM | POA: Insufficient documentation

## 2021-08-25 DIAGNOSIS — Z170 Estrogen receptor positive status [ER+]: Secondary | ICD-10-CM | POA: Diagnosis not present

## 2021-08-25 NOTE — Progress Notes (Signed)
[facility] NOTE  Patient Care Team: Jinny Sanders, MD as PCP - General (Family Medicine) Richard Miu, DMD as Consulting Physician (Dentistry)  CHIEF COMPLAINTS/PURPOSE OF CONSULTATION: Breast cancer  # Targeted ultrasound is performed by the physician from the 10 o'clock to 2 o'clock positions. No sonographic correlate or suspicious solid or cystic mass identified.   IMPRESSION: Suspicious architectural distortion at the right breast 12 o'clock position far posterior depth. Recommend stereotactic guided biopsy.  Breast, right, needle core biopsy, upper inner -LOBULAR  INVASIVE MAMMARY CARCINOMA; - MAMMARY CARCINOMA IN SITU; ER/PR POSITIVE; her- 2 NEGATIVE; ki-67-  FINAL MICROSCOPIC DIAGNOSIS:   A. LYMPH NODE, RIGHT AXILLARY #1, SENTINEL, EXCISION:  - One lymph node negative for metastatic carcinoma (0/1).   B. LYMPH NODE, RIGHT AXILLARY, SENTINEL, EXCISION:  - Metastatic carcinoma in 1 lymph node (1/1).  - Metastasis is 0.4 cm.  - No extranodal extension.   C. LYMPH NODE, RIGHT AXILLARY, SENTINEL, EXCISION:  - Benign nerve.  - No lymph node tissue.   D. LYMPH NODE, RIGHT AXILLARY #2, SENTINEL, EXCISION:  - One lymph node negative for metastatic carcinoma (0/1).   E. LYMPH NODE, RIGHT AXILLARY, SENTINEL, EXCISION:  - One lymph node negative for metastatic carcinoma (0/1).   F. LYMPH NODE, RIGHT AXILLARY, SENTINEL, EXCISION:  - Metastatic carcinoma in 1 lymph node (1/1).  - Metastasis is 0.8 cm.  - No extranodal extension.   G. LYMPH NODE, RIGHT AXILLARY, SENTINEL, EXCISION:  - One lymph node negative for metastatic carcinoma (0/1).   H. LYMPH NODE, RIGHT AXILLARY, SENTINEL, EXCISION:  - One lymph node negative for metastatic carcinoma (0/1).   I. LYMPH NODE, RIGHT AXILLARY #3, SENTINEL, EXCISION:  - One lymph node negative for metastatic carcinoma (0/1).   J. LYMPH NODE, RIGHT AXILLARY, SENTINEL, EXCISION:  - One lymph node negative for  metastatic carcinoma (0/1).   K. LYMPH NODE, RIGHT AXILLARY, SENTINEL, EXCISION:  - One lymph node negative for metastatic carcinoma (0/1).   L. LYMPH NODE, RIGHT AXILLARY, SENTINEL, EXCISION:  - One lymph node negative for metastatic carcinoma (0/1).   M. BREAST, RIGHT, LUMPECTOMY:  - Invasive lobular carcinoma, 2.1 cm.  - Margins not involved.  - Invasive carcinoma 0.2 cm from superior margin and 0.6 cm from medial  margin.  - Biopsy site and biopsy clip.  - See oncology table.   ONCOLOGY TABLE:  INVASIVE CARCINOMA OF THE BREAST:  Resection  Procedure: Right breast lumpectomy and eleven sentinel lymph nodes.  Specimen Laterality: Right breast.  Histologic Type: Invasive lobular carcinoma.  Histologic Grade:       Glandular ([place])/Tubular Differentiation: 3       Nuclear Pleomorphism: 1       Mitotic Rate: 1       Overall Grade: 1  Tumor Size: 2.1 x 1.4 x 1.1 cm.  Treatment Effect in the Breast: No known presurgical therapy  Margins: All surgical margins negative for invasive carcinoma.       Distance from Closest Margin (mm): 2 mm       Specify Closest Margin (required only if <51m): Superior  Regional Lymph Nodes:       Number of Lymph Nodes Examined: 11       Number of Sentinel Nodes Examined: 11       Number of Lymph Nodes with Macrometastases (>2 mm): 2       Number of Lymph Nodes with Micrometastases: 0       Number of Lymph  Nodes with Isolated Tumor Cells (=0.2 mm or =200  cells): 0       Size of Largest Metastatic Deposit (mm): 8 mm       Extranodal Extension: Not identified  Breast Biomarker Testing Performed on Previous Biopsy:       Testing Performed on Case Number: [id number]             Estrogen Receptor: 90%, positive, strong staining.             Progesterone Receptor: 10%, positive, moderate staining.             HER2: Equivocal with IHC.  Negative with FISH, ratio 1.15.             Ki-67: 10%  Pathologic Stage Classification (pTNM, AJCC 8th Edition):  pT2, pN1a  Representative Tumor Block: M3-M6  Comment(s): Immunohistochemistry for cytokeratin AE1/AE3 is performed on  the sentinel lymph nodes.  (v4.5.0.0)  Oncology History   No history exists.    HISTORY OF PRESENTING ILLNESS:  April Ross 70 y.o.  female recent diagnosis of breast cancer is here to review the results of her pathology/treatment plan.  In the interim patient was evaluated by radiation oncology in [facility] for consideration of radiation.  However since Oncotype was not done-currently on hold.  Patient is quite upset that Oncotype was not ordered.  Patient because of her social obligations-had reschedule appointment to later date after her surgery.  Review of Systems  Constitutional:  Negative for chills, diaphoresis, fever, malaise/fatigue and weight loss.  HENT:  Negative for nosebleeds and sore throat.   Eyes:  Negative for double vision.  Respiratory:  Negative for cough, hemoptysis, sputum production, shortness of breath and wheezing.   Cardiovascular:  Negative for chest pain, palpitations, orthopnea and leg swelling.  Gastrointestinal:  Negative for abdominal pain, blood in stool, constipation, diarrhea, heartburn, melena, nausea and vomiting.  Genitourinary:  Negative for dysuria, frequency and urgency.  Musculoskeletal:  Positive for joint pain. Negative for back pain.  Skin: Negative.  Negative for itching and Schiavo.  Neurological:  Negative for dizziness, tingling, focal weakness, weakness and headaches.  Endo/Heme/Allergies:  Does not bruise/bleed easily.  Psychiatric/Behavioral:  Negative for depression. The patient is not nervous/anxious and does not have insomnia.     MEDICAL HISTORY:  Past Medical History:  Diagnosis Date   Allergy    Arthritis    CRD (chronic renal disease)    stage 3   Depression    Diabetes mellitus without complication ([facility])    GERD (gastroesophageal reflux disease)    Hyperlipidemia    Hypertension    LBBB (left  bundle branch block)    a. diagnosed in 10/2017 - echo showed a preserved EF of 55-60% and NST was low-risk showing no evidence of ischemia.    Osteoporosis    polyps in colon    Thyroid disease    Urinary tract infection     SURGICAL HISTORY: Past Surgical History:  Procedure Laterality Date   ABDOMINAL HYSTERECTOMY     APPENDECTOMY     BREAST LUMPECTOMY WITH RADIOACTIVE SEED AND SENTINEL LYMPH NODE BIOPSY Right 07/24/2021   Procedure: RIGHT BREAST LUMPECTOMY WITH RADIOACTIVE SEED AND SENTINEL LYMPH NODE BIOPSY;  Surgeon: Jovita Kussmaul, MD;  Location: [facility];  Service: General;  Laterality: Right;    SOCIAL HISTORY: Social History   Socioeconomic History   Marital status: Widowed    Spouse name: Not on file   Number of children:  Not on file   Years of education: Not on file   Highest education level: Not on file  Occupational History   Not on file  Tobacco Use   Smoking status: Every Day    Packs/day: 0.50    Types: Cigarettes   Smokeless tobacco: Never  Vaping Use   Vaping Use: Never used  Substance and Sexual Activity   Alcohol use: Yes    Alcohol/week: 0.0 standard drinks    Comment: occ glass of wine or mix drink   Drug use: No   Sexual activity: Not Currently  Other Topics Concern   Not on file  Social History Narrative   Moved from [place] 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    Social Determinants of Health   Financial Resource Strain: Low Risk    Difficulty of Paying Living Expenses: Not very hard  Food Insecurity: No Food Insecurity   Worried About Charity fundraiser in the Last Year: Never true   Ran Out of Food in the Last Year: Never true  Transportation Needs: No Transportation Needs   Lack of Transportation (Medical): No   Lack of Transportation (Non-Medical): No  Physical Activity: Not on file  Stress: Not on file  Social Connections: Not on file  Intimate Partner  Violence: Not on file    FAMILY HISTORY: Family History  Problem Relation Age of Onset   Alzheimer's disease Mother    Hyperlipidemia Mother    Hypertension Mother    Asthma Maternal Grandmother    Diabetes Maternal Grandfather    Alcohol abuse Maternal Grandfather    Hyperlipidemia Maternal Grandfather    Stroke Maternal Grandfather    Hypertension Maternal Grandfather    Diabetes Paternal Grandmother    Alcohol abuse Maternal Uncle    Cholecystitis Maternal Uncle    Arthritis Maternal Uncle     ALLERGIES:  is allergic to acetaminophen, asa [aspirin], ibuprofen, and other.  MEDICATIONS:  Current Outpatient Medications  Medication Sig Dispense Refill   amLODipine (NORVASC) 5 MG tablet Take 1 tablet (5 mg total) by mouth daily. 90 tablet 11   cetirizine (ZYRTEC) 10 MG tablet Take 10 mg by mouth at bedtime as needed for allergies.     CINNAMON PO Take 1 tablet by mouth daily.     fluticasone (FLONASE) 50 MCG/ACT nasal spray Place 2 sprays into both nostrils daily as needed for allergies.     gabapentin (NEURONTIN) 100 MG capsule Take 100 mg by mouth 3 (three) times daily as needed.     glyBURIDE (DIABETA) 5 MG tablet TAKE 1 TABLET (5 MG TOTAL) BY MOUTH 2 (TWO) TIMES DAILY WITH A MEAL. 180 tablet 1   levothyroxine (SYNTHROID) 88 MCG tablet TAKE 1 TABLET BY MOUTH EVERY DAY 90 tablet 3   losartan (COZAAR) 100 MG tablet Take 1 tablet (100 mg total) by mouth daily. 90 tablet 1   metFORMIN (GLUCOPHAGE) 500 MG tablet TAKE 1 TABLET BY MOUTH 2 TIMES DAILY WITH A MEAL. 180 tablet 1   Omega-3 Fatty Acids (FISH OIL OMEGA-3 PO) Take 1 capsule by mouth daily.     omeprazole (PRILOSEC) 20 MG capsule TAKE 1 CAPSULE BY MOUTH EVERY DAY 90 capsule 3   rosuvastatin (CRESTOR) 20 MG tablet TAKE 1 TABLET BY MOUTH EVERY DAY 90 tablet 3   tiZANidine (ZANAFLEX) 4 MG tablet TAKE 1 TABLET BY MOUTH AT BEDTIME AS NEEDED FOR MUSCLE SPASMS. 90 tablet 0   traMADol (ULTRAM) 50 MG  tablet Take 1 tablet (50 mg  total) by mouth every 6 (six) hours as needed. 15 tablet 0   Turmeric (QC TUMERIC COMPLEX PO) Take 1 tablet by mouth daily.     VITAMIN D PO Take 2 each by mouth daily.     aspirin EC 81 MG tablet Take 81 mg by mouth daily. Swallow whole. (Patient not taking: Reported on 08/17/2021)     No current facility-administered medications for this visit.      Marland Kitchen  PHYSICAL EXAMINATION: ECOG PERFORMANCE STATUS: 0 - Asymptomatic  Vitals:   08/25/21 0841  BP: (!) 175/78  Pulse: 70  Resp: 17  Temp: (!) 97.1 F (36.2 C)  SpO2: 100%   Filed Weights   08/25/21 0841  Weight: 154 lb (69.9 kg)       LABORATORY DATA:  I have reviewed the data as listed Lab Results  Component Value Date   WBC 8.5 12/01/2020   HGB 12.8 12/01/2020   HCT 38.2 12/01/2020   MCV 83.6 12/01/2020   PLT 240 12/01/2020   Recent Labs    09/28/20 2031 11/18/20 0813 12/01/20 1835 02/17/21 0741 06/09/21 0803 07/21/21 1223  NA 137   < > 131* 139 138 139  K 4.0   < > 4.3 4.5 4.7 4.3  CL 102   < > 98 102 105 107  CO2 24   < > 22 27 24 24   GLUCOSE 207*   < > 367* 205* 125* 211*  BUN 25*   < > 26* 31* 29* 22  CREATININE 1.42*   < > 1.41* 1.63* 1.58* 1.45*  CALCIUM 9.7   < > 9.8 10.6* 10.3 10.0  GFRNONAA 40*  --  40*  --   --  38*  PROT 6.9   < > 7.2 7.6 7.3  --   ALBUMIN 4.1   < > 4.3 4.9 4.8  --   AST 55*   < > 66* 34 23  --   ALT 37   < > 39 32 22  --   ALKPHOS 66   < > 61 64 48  --   BILITOT 0.4   < > 0.3 0.4 0.4  --    < > = values in this interval not displayed.    RADIOGRAPHIC STUDIES: I have personally reviewed the radiological images as listed and agreed with the findings in the report. No results found.  ASSESSMENT & PLAN:   Carcinoma of upper-inner quadrant of right breast in female, estrogen receptor positive ([place]) #Invasive lobular cancer -pathologic stage I B- T2N1 [2/11 LN positive]-ER/PR positive HER2 negative grade 1. Ki-67-10%.  Recommend Oncotype for further to help decide on  patient's need for chemotherapy.  #Based on Oncotype testing/recurrent score-we will discuss if patient needs chemotherapy.  For now recommend holding radiation until we get the results of the Oncotype.  Given the ER/PR positive disease-I discussed the role of adjuvant endocrine therapy for 5 to 10 years.  Endocrine therapy to be started after radiation. Will inform Dr.Toth/Dr.Moody.   #Genetics: Discuss.  Patient has no siblings.  No biological children; adopted daughter.  #Patient extremely upset that the Oncotype was not ordered after the surgery. Patient had rescheduled appointments to a later date [as she is her uncle caregiver].  Patient understands that it could take up to 2 weeks for the results of the testing come back.  Oncotype testing ordered today.  Send a message to Ulice Dash re: patient's questions for out-of-pocket expenses.  Patient  has a cancer policy [precision medicine]-she will reach out to the company with further information as requested in the paperwork.  # DISPOSITION: # follow up TBD-Dr.B  # 25 minutes face-to-face with the patient discussing the above plan of care; more than 50% of time spent on prognosis/ natural history; counseling and coordination.     All questions were answered. The patient/family knows to call the clinic with any problems, questions or concerns.    Cammie Sickle, MD 08/25/2021 12:09 PM

## 2021-08-25 NOTE — Assessment & Plan Note (Addendum)
#  Invasive lobular cancer -pathologic stage I B- T2N1 [2/11 LN positive]-ER/PR positive HER2 negative grade 1. Ki-67-10%.  Recommend Oncotype for further to help decide on patient's need for chemotherapy.  #Based on Oncotype testing/recurrent score-we will discuss if patient needs chemotherapy.  For now recommend holding radiation until we get the results of the Oncotype.  Given the ER/PR positive disease-I discussed the role of adjuvant endocrine therapy for 5 to 10 years.  Endocrine therapy to be started after radiation. Will inform Dr.Toth/Dr.Moody.   #Genetics: Discuss.  Patient has no siblings.  No biological children; adopted daughter.  #Patient extremely upset that the Oncotype was not ordered after the surgery. Patient had rescheduled appointments to a later date [as she is her uncle caregiver].  Patient understands that it could take up to 2 weeks for the results of the testing come back.  Oncotype testing ordered today.  Send a message to Ulice Dash re: patient's questions for out-of-pocket expenses.  Patient  has a cancer policy [precision medicine]-she will reach out to the company with further information as requested in the paperwork.  # DISPOSITION: # follow up TBD-Dr.B  # 25 minutes face-to-face with the patient discussing the above plan of care; more than 50% of time spent on prognosis/ natural history; counseling and coordination.

## 2021-08-25 NOTE — Telephone Encounter (Signed)
Oncotype ordered for [facility] [id number] via fax.  Notification faxed to [facility] pathology.

## 2021-08-25 NOTE — Progress Notes (Signed)
Patient here for oncology follow-up appointment, expresses frustration of why testing hasn't been issued and complaints of neuropathy.

## 2021-08-29 ENCOUNTER — Ambulatory Visit: Payer: PPO | Admitting: Internal Medicine

## 2021-09-04 NOTE — Telephone Encounter (Signed)
Confirmed with [employer] (oncotype) that specimen has been received and is in process, should result in 10-14 days.

## 2021-09-05 ENCOUNTER — Other Ambulatory Visit: Payer: Self-pay

## 2021-09-05 ENCOUNTER — Ambulatory Visit: Payer: PPO

## 2021-09-05 DIAGNOSIS — Z483 Aftercare following surgery for neoplasm: Secondary | ICD-10-CM

## 2021-09-05 DIAGNOSIS — C50211 Malignant neoplasm of upper-inner quadrant of right female breast: Secondary | ICD-10-CM | POA: Diagnosis not present

## 2021-09-05 DIAGNOSIS — R293 Abnormal posture: Secondary | ICD-10-CM

## 2021-09-05 DIAGNOSIS — Z170 Estrogen receptor positive status [ER+]: Secondary | ICD-10-CM | POA: Diagnosis not present

## 2021-09-05 NOTE — Therapy (Addendum)
[facility] @ [address] [place], [place], [address] Phone: [phone]   Fax:  [phone]  Physical Therapy Treatment  Patient Details  Name: April Ross MRN: [id number] Date of Birth: 29-Nov-1950 Referring Provider (PT): Dr. Marlou Starks   Encounter Date: 09/05/2021   PT End of Session - 09/05/21 0955     Visit Number 3    Number of Visits 18    Date for PT Re-Evaluation 10/11/21    PT Start Time 0904    PT Stop Time 0958    PT Time Calculation (min) 54 min    Activity Tolerance Patient tolerated treatment well    Behavior During Therapy [facility] for tasks assessed/performed             Past Medical History:  Diagnosis Date   Allergy    Arthritis    CRD (chronic renal disease)    stage 3   Depression    Diabetes mellitus without complication (HCC)    GERD (gastroesophageal reflux disease)    Hyperlipidemia    Hypertension    LBBB (left bundle branch block)    a. diagnosed in 10/2017 - echo showed a preserved EF of 55-60% and NST was low-risk showing no evidence of ischemia.    Osteoporosis    polyps in colon    Thyroid disease    Urinary tract infection     Past Surgical History:  Procedure Laterality Date   ABDOMINAL HYSTERECTOMY     APPENDECTOMY     BREAST LUMPECTOMY WITH RADIOACTIVE SEED AND SENTINEL LYMPH NODE BIOPSY Right 07/24/2021   Procedure: RIGHT BREAST LUMPECTOMY WITH RADIOACTIVE SEED AND SENTINEL LYMPH NODE BIOPSY;  Surgeon: Jovita Kussmaul, MD;  Location: [place];  Service: General;  Laterality: Right;    There were no vitals filed for this visit.   Subjective Assessment - 09/05/21 0906     Subjective I'm just feeling really sore in my Rt armpit into my upper arm. It comes and goes.    Pertinent History Pt will be having a Rt lumpectomy with 2/11 lymph nodes positive. Will see oncologist tomorrow for treatment plan.    other history includes: DM, stage 3 CKD, osteoporosis     Patient Stated Goals get information from providers    Currently in Pain? Yes    Pain Score 2     Pain Location Axilla    Pain Orientation Right    Pain Descriptors / Indicators Sore    Pain Type Surgical pain    Pain Radiating Towards upper arm    Pain Onset 1 to 4 weeks ago    Pain Frequency Constant    Aggravating Factors  increased activity    Pain Relieving Factors rest                               [facility] Adult PT Treatment/Exercise - 09/05/21 0001       Manual Therapy   Manual Therapy Myofascial release;Scapular mobilization;Passive ROM    Myofascial Release To Rt axilla into medial upper arm    Scapular Mobilization In Lt S/L to Rt scapula into protraction and retraction with scapular depression throughout P/ROM    Passive ROM In Supine to Rt shoulder into flexion, abduction and D2                          PT  Long Term Goals - 08/16/21 0946       PT LONG TERM GOAL #1   Title Pt will return to baseline AROM    Status Partially Met      PT LONG TERM GOAL #2   Title Pt will decrease resting axillary pain to 0/10    Baseline 2-3/10    Time 8    Period Weeks    Status New                   Plan - 09/05/21 4098     Clinical Impression Statement Began first session of treatment for Rt axillary tightness. Manual therapy to Rt axilla to medial upper arm where cording adnd tightness palpable. Also trigger points noted at Rt medial border of scapula and to the Rt of T-Spine where pt c/o tenderness there since surgery. Good softening noted by end of session and pt reports feeling looser thorughout her Rt upper quadrant.    Personal Factors and Comorbidities Age;Comorbidity 3+    Comorbidities DM, osteoporosis, CKD    Stability/Clinical Decision Making Stable/Uncomplicated    Rehab Potential Excellent    PT Frequency 2x / week    PT Duration 8 weeks    PT Treatment/Interventions ADLs/Self Care Home Management;Patient/family  education;Therapeutic exercise;Manual techniques    PT Next Visit Plan recheck AROM, how are cords and new stretches, PROM/pulleys/cording release    PT Home Exercise Plan Access Code: [email or web address] and post op supine    Consulted and Agree with Plan of Care Patient             Patient will benefit from skilled therapeutic intervention in order to improve the following deficits and impairments:  Postural dysfunction, Decreased knowledge of precautions  Visit Diagnosis: Carcinoma of upper-inner quadrant of right breast in female, estrogen receptor positive ([place])  Aftercare following surgery for neoplasm  Abnormal posture     Problem List Patient Active Problem List   Diagnosis Date Noted   Chronic tension-type headache, not intractable 07/25/2021   Carcinoma of upper-inner quadrant of right breast in female, estrogen receptor positive ([place]) 06/15/2021   Diabetic retinopathy ([place]) 11/24/2020   ETD (Eustachian tube dysfunction), right 08/04/2019   Chronic bilateral thoracic back pain 11/21/2018   Type 2 diabetes mellitus with nephropathy ([place]) 10/27/2017   Tobacco abuse 10/27/2017   Nonspecific chest pain    Dizziness 05/16/2017   Osteoporosis 05/24/2016   CKD (chronic kidney disease) stage 3, GFR 30-59 ml/min ([place]) 05/08/2016   Hyperlipidemia associated with type 2 diabetes mellitus ([place]) 01/12/2016   Hypothyroidism 01/12/2016   Hypertension associated with diabetes ([place]) 01/12/2016   Allergic rhinitis 01/12/2016   Moderate major depression, single episode ([place]) 01/12/2016   Chronic insomnia 01/12/2016   GERD (gastroesophageal reflux disease) 01/12/2016    Otelia Limes, PTA 09/05/2021, 11:08 AM  [place] @ [place], [place], [address] Phone: [phone]   Fax:  [phone]  Name: April Ross MRN: [id number] Date of Birth: 09-04-1951

## 2021-09-07 ENCOUNTER — Ambulatory Visit: Payer: PPO | Attending: General Surgery

## 2021-09-07 ENCOUNTER — Other Ambulatory Visit: Payer: Self-pay

## 2021-09-07 DIAGNOSIS — R293 Abnormal posture: Secondary | ICD-10-CM | POA: Diagnosis present

## 2021-09-07 DIAGNOSIS — C50211 Malignant neoplasm of upper-inner quadrant of right female breast: Secondary | ICD-10-CM | POA: Insufficient documentation

## 2021-09-07 DIAGNOSIS — Z170 Estrogen receptor positive status [ER+]: Secondary | ICD-10-CM | POA: Insufficient documentation

## 2021-09-07 DIAGNOSIS — Z483 Aftercare following surgery for neoplasm: Secondary | ICD-10-CM | POA: Diagnosis present

## 2021-09-07 NOTE — Therapy (Signed)
[facility] @ [address] [place], [place], [address] Phone: [phone]   Fax:  [phone]  Physical Therapy Treatment  Patient Details  Name: April Ross MRN: [id number] Date of Birth: [birth year] Referring Provider (PT): Dr. Marlou Starks   Encounter Date: 09/07/2021   PT End of Session - 09/07/21 0900     Visit Number 4    Number of Visits 18    Date for PT Re-Evaluation 10/11/21    PT Start Time 0804    PT Stop Time 0900    PT Time Calculation (min) 56 min    Activity Tolerance Patient tolerated treatment well    Behavior During Therapy [facility] for tasks assessed/performed             Past Medical History:  Diagnosis Date   Allergy    Arthritis    CRD (chronic renal disease)    stage 3   Depression    Diabetes mellitus without complication (HCC)    GERD (gastroesophageal reflux disease)    Hyperlipidemia    Hypertension    LBBB (left bundle branch block)    a. diagnosed in 10/2017 - echo showed a preserved EF of 55-60% and NST was low-risk showing no evidence of ischemia.    Osteoporosis    polyps in colon    Thyroid disease    Urinary tract infection     Past Surgical History:  Procedure Laterality Date   ABDOMINAL HYSTERECTOMY     APPENDECTOMY     BREAST LUMPECTOMY WITH RADIOACTIVE SEED AND SENTINEL LYMPH NODE BIOPSY Right 07/24/2021   Procedure: RIGHT BREAST LUMPECTOMY WITH RADIOACTIVE SEED AND SENTINEL LYMPH NODE BIOPSY;  Surgeon: Jovita Kussmaul, MD;  Location: [place];  Service: General;  Laterality: Right;    There were no vitals filed for this visit.   Subjective Assessment - 09/07/21 0804     Subjective I was sore after our last visit, mostly where you were working on those trigger points at my shoulder blade. But notmore than I expected to be.    Pertinent History Pt will be having a Rt lumpectomy with 2/11 lymph nodes positive. Will see oncologist tomorrow for treatment plan.     other history includes: DM, stage 3 CKD, osteoporosis    Patient Stated Goals get information from providers    Currently in Pain? Yes    Pain Score 2     Pain Location Axilla    Pain Orientation Right    Pain Type Surgical pain    Pain Radiating Towards upper arm    Pain Onset 1 to 4 weeks ago    Pain Frequency Constant    Aggravating Factors  increased activity    Pain Relieving Factors gabapentin is seeming to help some                [facility] PT Assessment - 09/07/21 0001       AROM   Right Shoulder Flexion 152 Degrees    Right Shoulder ABduction 169 Degrees                           [facility] Adult PT Treatment/Exercise - 09/07/21 0001       Manual Therapy   Manual Therapy Myofascial release;Scapular mobilization;Passive ROM    Myofascial Release To Rt axilla into medial upper arm    Scapular Mobilization In Lt S/L to Rt scapula into protraction and retraction  with scapular depression throughout P/ROM    Passive ROM In Supine to Rt shoulder into flexion, abduction and D2                          PT Long Term Goals - 08/16/21 0946       PT LONG TERM GOAL #1   Title Pt will return to baseline AROM    Status Partially Met      PT LONG TERM GOAL #2   Title Pt will decrease resting axillary pain to 0/10    Baseline 2-3/10    Time 8    Period Weeks    Status New                   Plan - 09/07/21 0900     Clinical Impression Statement Continued with manual therapy as done at last session. Much improvement noted with trigger point and tenderness at Rt T-Spine trigger point by end of session. Her P/ROM and A/ROM is much improved as well. Pt will benefit from contiunud therapy at this time to work toweards progressing her HEP to include postural strength as she will be ready for this at next session.    Personal Factors and Comorbidities Age;Comorbidity 3+    Comorbidities DM, osteoporosis, CKD    Stability/Clinical Decision  Making Stable/Uncomplicated    Rehab Potential Excellent    PT Frequency 2x / week    PT Duration 8 weeks    PT Treatment/Interventions ADLs/Self Care Home Management;Patient/family education;Therapeutic exercise;Manual techniques    PT Next Visit Plan Cont manual therapy as done today; add pulleys and supine scapular series next    PT Home Exercise Plan Access Code: [email or web address] and post op supine    Consulted and Agree with Plan of Care Patient             Patient will benefit from skilled therapeutic intervention in order to improve the following deficits and impairments:  Postural dysfunction, Decreased knowledge of precautions  Visit Diagnosis: Carcinoma of upper-inner quadrant of right breast in female, estrogen receptor positive ([place])  Aftercare following surgery for neoplasm  Abnormal posture     Problem List Patient Active Problem List   Diagnosis Date Noted   Chronic tension-type headache, not intractable 07/25/2021   Carcinoma of upper-inner quadrant of right breast in female, estrogen receptor positive ([place]) 06/15/2021   Diabetic retinopathy ([place]) 11/24/2020   ETD (Eustachian tube dysfunction), right 08/04/2019   Chronic bilateral thoracic back pain 11/21/2018   Type 2 diabetes mellitus with nephropathy ([place]) 10/27/2017   Tobacco abuse 10/27/2017   Nonspecific chest pain    Dizziness 05/16/2017   Osteoporosis 05/24/2016   CKD (chronic kidney disease) stage 3, GFR 30-59 ml/min ([place]) 05/08/2016   Hyperlipidemia associated with type 2 diabetes mellitus ([place]) 01/12/2016   Hypothyroidism 01/12/2016   Hypertension associated with diabetes ([place]) 01/12/2016   Allergic rhinitis 01/12/2016   Moderate major depression, single episode ([place]) 01/12/2016   Chronic insomnia 01/12/2016   GERD (gastroesophageal reflux disease) 01/12/2016    Otelia Limes, PTA 09/07/2021, 9:03 AM  [place] @ [address] [place], [place], [address] Phone: [phone]   Fax:  [phone]  Name: Lucella Pommier Luhmann MRN: [id number] Date of Birth: 11/20/1950

## 2021-09-12 ENCOUNTER — Ambulatory Visit: Payer: PPO | Admitting: Rehabilitation

## 2021-09-12 ENCOUNTER — Encounter: Payer: Self-pay | Admitting: Rehabilitation

## 2021-09-12 ENCOUNTER — Other Ambulatory Visit: Payer: Self-pay

## 2021-09-12 DIAGNOSIS — C50211 Malignant neoplasm of upper-inner quadrant of right female breast: Secondary | ICD-10-CM | POA: Diagnosis not present

## 2021-09-12 DIAGNOSIS — Z483 Aftercare following surgery for neoplasm: Secondary | ICD-10-CM

## 2021-09-12 NOTE — Therapy (Signed)
[place] [facility] @ [address] [place], [place], [address] Phone: [phone]   Fax:  [phone]  Physical Therapy Treatment  Patient Details  Name: April Ross MRN: [id number] Date of Birth: [birth year] Referring Provider (PT): Dr. Marlou Starks   Encounter Date: 09/12/2021   PT End of Session - 09/12/21 0848     Visit Number 5    Number of Visits 18    Date for PT Re-Evaluation 10/11/21    PT Start Time 0803    PT Stop Time 0848    PT Time Calculation (min) 45 min    Activity Tolerance Patient tolerated treatment well    Behavior During Therapy [facility] for tasks assessed/performed             Past Medical History:  Diagnosis Date   Allergy    Arthritis    CRD (chronic renal disease)    stage 3   Depression    Diabetes mellitus without complication (HCC)    GERD (gastroesophageal reflux disease)    Hyperlipidemia    Hypertension    LBBB (left bundle branch block)    a. diagnosed in 10/2017 - echo showed a preserved EF of 55-60% and NST was low-risk showing no evidence of ischemia.    Osteoporosis    polyps in colon    Thyroid disease    Urinary tract infection     Past Surgical History:  Procedure Laterality Date   ABDOMINAL HYSTERECTOMY     APPENDECTOMY     BREAST LUMPECTOMY WITH RADIOACTIVE SEED AND SENTINEL LYMPH NODE BIOPSY Right 07/24/2021   Procedure: RIGHT BREAST LUMPECTOMY WITH RADIOACTIVE SEED AND SENTINEL LYMPH NODE BIOPSY;  Surgeon: Jovita Kussmaul, MD;  Location: [facility];  Service: General;  Laterality: Right;    There were no vitals filed for this visit.   Subjective Assessment - 09/12/21 0806     Subjective nothing new.  I am feeling about 80% and much better    Pertinent History Pt will be having a Rt lumpectomy with 2/11 lymph nodes positive. Will see oncologist tomorrow for treatment plan.    other history includes: DM, stage 3 CKD, osteoporosis    Currently in Pain? No/denies                                [facility] Adult PT Treatment/Exercise - 09/12/21 0001       Exercises   Exercises Shoulder      Shoulder Exercises: Supine   Horizontal ABduction Both;10 reps    Theraband Level (Shoulder Horizontal ABduction) Level 1 (Yellow)    Horizontal ABduction Limitations with initial instruction    External Rotation Both;10 reps    Theraband Level (Shoulder External Rotation) Level 1 (Yellow)    External Rotation Limitations with initial instruction    Flexion Both;10 reps    Theraband Level (Shoulder Flexion) Level 1 (Yellow)    Flexion Limitations with initial instruction    Diagonals Both;10 reps    Theraband Level (Shoulder Diagonals) Level 1 (Yellow)    Diagonals Limitations with  initial instruction      Shoulder Exercises: Pulleys   Flexion 2 minutes    Flexion Limitations with initial instruction    ABduction 2 minutes    ABduction Limitations with initial instruction      Shoulder Exercises: Therapy Ball   Flexion Both;5 reps      Manual Therapy  Myofascial Release To Rt axilla into medial upper arm    Passive ROM In Supine to Rt shoulder into flexion, abduction and D2                          PT Long Term Goals - 08/16/21 0946       PT LONG TERM GOAL #1   Title Pt will return to baseline AROM    Status Partially Met      PT LONG TERM GOAL #2   Title Pt will decrease resting axillary pain to 0/10    Baseline 2-3/10    Time 8    Period Weeks    Status New                   Plan - 09/12/21 0849     Clinical Impression Statement Pt is doing much better already. cording is palpable in the axilla but feeling less pull with it mainly at end range.  Added postural TE wtihout pain or real pull.  Told pt she could let us know when she would like to be done with visits as she is nearing not needing much in clinic.    PT Frequency 2x / week    PT Duration 8 weeks    PT Treatment/Interventions  ADLs/Self Care Home Management;Patient/family education;Therapeutic exercise;Manual techniques    PT Next Visit Plan Cont pulleys/ball//supine scap and MT to Rt axilla/cording until pt feels ready for DC.    Try doorway or wall stretch    Consulted and Agree with Plan of Care Patient             Patient will benefit from skilled therapeutic intervention in order to improve the following deficits and impairments:     Visit Diagnosis: Aftercare following surgery for neoplasm     Problem List Patient Active Problem List   Diagnosis Date Noted   Chronic tension-type headache, not intractable 07/25/2021   Carcinoma of upper-inner quadrant of right breast in female, estrogen receptor positive ([place]) 06/15/2021   Diabetic retinopathy ([place]) 11/24/2020   ETD (Eustachian tube dysfunction), right 08/04/2019   Chronic bilateral thoracic back pain 11/21/2018   Type 2 diabetes mellitus with nephropathy ([place]) 10/27/2017   Tobacco abuse 10/27/2017   Nonspecific chest pain    Dizziness 05/16/2017   Osteoporosis 05/24/2016   CKD (chronic kidney disease) stage 3, GFR 30-59 ml/min ([place]) 05/08/2016   Hyperlipidemia associated with type 2 diabetes mellitus ([place]) 01/12/2016   Hypothyroidism 01/12/2016   Hypertension associated with diabetes ([place]) 01/12/2016   Allergic rhinitis 01/12/2016   Moderate major depression, single episode ([place]) 01/12/2016   Chronic insomnia 01/12/2016   GERD (gastroesophageal reflux disease) 01/12/2016    Stark Bray, PT 09/12/2021, 8:51 AM  [facility] @ [address] [place], [place], [address] Phone: [phone]   Fax:  [phone]  Name: April Ross MRN: [id number] Date of Birth: 1951/10/05

## 2021-09-12 NOTE — Patient Instructions (Signed)

## 2021-09-13 NOTE — Telephone Encounter (Signed)
Called [employer] to check the status of Oncotype result.  As of today there is an estimated result date of 12/15

## 2021-09-14 ENCOUNTER — Other Ambulatory Visit: Payer: Self-pay

## 2021-09-14 ENCOUNTER — Ambulatory Visit: Payer: PPO

## 2021-09-14 DIAGNOSIS — C50211 Malignant neoplasm of upper-inner quadrant of right female breast: Secondary | ICD-10-CM | POA: Diagnosis not present

## 2021-09-14 DIAGNOSIS — Z483 Aftercare following surgery for neoplasm: Secondary | ICD-10-CM

## 2021-09-14 DIAGNOSIS — R293 Abnormal posture: Secondary | ICD-10-CM

## 2021-09-14 DIAGNOSIS — Z170 Estrogen receptor positive status [ER+]: Secondary | ICD-10-CM

## 2021-09-14 NOTE — Therapy (Signed)
[facility] @ [address] [place], [place], [address] Phone: [phone]   Fax:  [phone]  Physical Therapy Treatment  Patient Details  Name: April Ross MRN: [id number] Date of Birth: 09-06-1951 Referring Provider (PT): Dr. Marlou Starks   Encounter Date: 09/14/2021   PT End of Session - 09/14/21 0907     Visit Number 6    Number of Visits 18    Date for PT Re-Evaluation 10/11/21    PT Start Time 0805    PT Stop Time 0906    PT Time Calculation (min) 61 min    Behavior During Therapy [facility] for tasks assessed/performed             Past Medical History:  Diagnosis Date   Allergy    Arthritis    CRD (chronic renal disease)    stage 3   Depression    Diabetes mellitus without complication (HCC)    GERD (gastroesophageal reflux disease)    Hyperlipidemia    Hypertension    LBBB (left bundle branch block)    a. diagnosed in 10/2017 - echo showed a preserved EF of 55-60% and NST was low-risk showing no evidence of ischemia.    Osteoporosis    polyps in colon    Thyroid disease    Urinary tract infection     Past Surgical History:  Procedure Laterality Date   ABDOMINAL HYSTERECTOMY     APPENDECTOMY     BREAST LUMPECTOMY WITH RADIOACTIVE SEED AND SENTINEL LYMPH NODE BIOPSY Right 07/24/2021   Procedure: RIGHT BREAST LUMPECTOMY WITH RADIOACTIVE SEED AND SENTINEL LYMPH NODE BIOPSY;  Surgeon: Jovita Kussmaul, MD;  Location: [facility];  Service: General;  Laterality: Right;    There were no vitals filed for this visit.   Subjective Assessment - 09/14/21 0807     Subjective I'm feeling so much better. The numbness in my Rt axilla is still there but improvng and the same for the tingling in my arm. I didn't do the new exs at home yet bcause Marcene Brawn said to only do them a few times a week.    Pertinent History Pt will be having a Rt lumpectomy with 2/11 lymph nodes positive. Will see oncologist tomorrow for  treatment plan.    other history includes: DM, stage 3 CKD, osteoporosis    Patient Stated Goals get information from providers    Currently in Pain? No/denies                [facility] PT Assessment - 09/14/21 0001       AROM   Right Shoulder Flexion 155 Degrees    Right Shoulder ABduction 169 Degrees                           [facility] Adult PT Treatment/Exercise - 09/14/21 0001       Manual Therapy   Myofascial Release To Rt axilla into medial upper arm    Passive ROM In Supine to Rt shoulder into flexion, abduction and D2      Prosthetics   Prosthetic Care Comments  Measured pt for a compression sleeve and she fit into a Medi Harmony size II, regular and was issued info on how to order this online.                          PT Long Term Goals - 09/14/21  0812       PT LONG TERM GOAL #1   Title Pt will return to baseline AROM    Baseline Flex 155 and abd 169 - 09/14/21    Status Achieved      PT LONG TERM GOAL #2   Title Pt will decrease resting axillary pain to 0/10    Baseline 2-3/10; 0/10 at rest now - 09/14/21    Status Achieved                   Plan - 09/14/21 1239     Clinical Impression Statement Pt continues to note feeling greatly improved as her cording is much less restrictive and her Rt shoulder A/ROM is much improved. She has met her goals at this time and is considering coming for one more visit next week for final assess after working on HEP over next week. Her cording is still palpable so focused on MFR working to decrease this during session today. This does still run into her upper arm some as well so if pt does return for one more visit it will be beneficial to cont with MFR to work on decreasing final restrictions here.    Personal Factors and Comorbidities Age;Comorbidity 3+    Comorbidities DM, osteoporosis, CKD    Stability/Clinical Decision Making Stable/Uncomplicated    Rehab Potential Excellent    PT  Frequency 2x / week    PT Duration 8 weeks    PT Treatment/Interventions ADLs/Self Care Home Management;Patient/family education;Therapeutic exercise;Manual techniques    PT Next Visit Plan Probable D/C next session. Cont MFR working to further decrease in Rt axilla to upper arm.    PT Home Exercise Plan Access Code: [email or web address] and post op supine    Consulted and Agree with Plan of Care Patient             Patient will benefit from skilled therapeutic intervention in order to improve the following deficits and impairments:  Postural dysfunction, Decreased knowledge of precautions  Visit Diagnosis: Aftercare following surgery for neoplasm  Carcinoma of upper-inner quadrant of right breast in female, estrogen receptor positive ([place])  Abnormal posture     Problem List Patient Active Problem List   Diagnosis Date Noted   Chronic tension-type headache, not intractable 07/25/2021   Carcinoma of upper-inner quadrant of right breast in female, estrogen receptor positive ([place]) 06/15/2021   Diabetic retinopathy ([place]) 11/24/2020   ETD (Eustachian tube dysfunction), right 08/04/2019   Chronic bilateral thoracic back pain 11/21/2018   Type 2 diabetes mellitus with nephropathy ([place]) 10/27/2017   Tobacco abuse 10/27/2017   Nonspecific chest pain    Dizziness 05/16/2017   Osteoporosis 05/24/2016   CKD (chronic kidney disease) stage 3, GFR 30-59 ml/min ([place]) 05/08/2016   Hyperlipidemia associated with type 2 diabetes mellitus ([place]) 01/12/2016   Hypothyroidism 01/12/2016   Hypertension associated with diabetes ([place]) 01/12/2016   Allergic rhinitis 01/12/2016   Moderate major depression, single episode ([place]) 01/12/2016   Chronic insomnia 01/12/2016   GERD (gastroesophageal reflux disease) 01/12/2016    Otelia Limes, PTA 09/14/2021, 12:45 PM  [place] @ [place], [place], [address] Phone: [phone]   Fax:   [phone]  Name: April Ross MRN: [id number] Date of Birth: 05-17-1951

## 2021-09-19 ENCOUNTER — Ambulatory Visit
Admission: RE | Admit: 2021-09-19 | Discharge: 2021-09-19 | Disposition: A | Discharge status: Home / Self Care | Payer: PPO | Source: Ambulatory Visit | Attending: Radiation Oncology | Admitting: Radiation Oncology

## 2021-09-19 ENCOUNTER — Other Ambulatory Visit: Payer: Self-pay

## 2021-09-19 DIAGNOSIS — Z170 Estrogen receptor positive status [ER+]: Secondary | ICD-10-CM | POA: Diagnosis not present

## 2021-09-19 DIAGNOSIS — Z510 Encounter for antineoplastic radiation therapy: Secondary | ICD-10-CM | POA: Insufficient documentation

## 2021-09-19 DIAGNOSIS — C50211 Malignant neoplasm of upper-inner quadrant of right female breast: Secondary | ICD-10-CM | POA: Insufficient documentation

## 2021-09-20 NOTE — Telephone Encounter (Signed)
Called [employer] on 09/19/21 for an update of Oncotype test results.  They are waiting on insurance prior authorization before releasing results.  The test does cost $3,000 and is unknown what will be patient's responsibility without PA.

## 2021-09-20 NOTE — Telephone Encounter (Signed)
MyChart message sent to patient with an update of Oncotype.

## 2021-09-21 ENCOUNTER — Other Ambulatory Visit: Payer: Self-pay

## 2021-09-21 ENCOUNTER — Ambulatory Visit: Payer: PPO

## 2021-09-21 ENCOUNTER — Encounter: Payer: Self-pay | Admitting: Radiation Oncology

## 2021-09-21 DIAGNOSIS — Z170 Estrogen receptor positive status [ER+]: Secondary | ICD-10-CM

## 2021-09-21 DIAGNOSIS — C50211 Malignant neoplasm of upper-inner quadrant of right female breast: Secondary | ICD-10-CM

## 2021-09-21 DIAGNOSIS — R293 Abnormal posture: Secondary | ICD-10-CM

## 2021-09-21 DIAGNOSIS — Z483 Aftercare following surgery for neoplasm: Secondary | ICD-10-CM

## 2021-09-21 NOTE — Therapy (Signed)
[facility] @ [place], [place], [address] Phone: [phone]   Fax:  [phone]  Physical Therapy Treatment  Patient Details  Name: April Ross MRN: [id number] Date of Birth: [birth year] Referring Provider (PT): Dr. Marlou Starks   Encounter Date: 09/21/2021   PT End of Session - 09/21/21 0856     Visit Number 7    Number of Visits 18    Date for PT Re-Evaluation 10/11/21    PT Start Time 0805    PT Stop Time 6962    PT Time Calculation (min) 53 min    Activity Tolerance Patient tolerated treatment well    Behavior During Therapy [facility] for tasks assessed/performed             Past Medical History:  Diagnosis Date   Allergy    Arthritis    CRD (chronic renal disease)    stage 3   Depression    Diabetes mellitus without complication (HCC)    GERD (gastroesophageal reflux disease)    Hyperlipidemia    Hypertension    LBBB (left bundle branch block)    a. diagnosed in 10/2017 - echo showed a preserved EF of 55-60% and NST was low-risk showing no evidence of ischemia.    Osteoporosis    polyps in colon    Thyroid disease    Urinary tract infection     Past Surgical History:  Procedure Laterality Date   ABDOMINAL HYSTERECTOMY     APPENDECTOMY     BREAST LUMPECTOMY WITH RADIOACTIVE SEED AND SENTINEL LYMPH NODE BIOPSY Right 07/24/2021   Procedure: RIGHT BREAST LUMPECTOMY WITH RADIOACTIVE SEED AND SENTINEL LYMPH NODE BIOPSY;  Surgeon: Jovita Kussmaul, MD;  Location: [facility];  Service: General;  Laterality: Right;    There were no vitals filed for this visit.   Subjective Assessment - 09/21/21 0810     Subjective I was extremely sensitive in my Lt chest after our last session but I feel fine now and Ithink my motion is better since then as well. I had my radiation simulation Tuesday and that went well. I start radiation Wednesday.    Pertinent History Pt will be having a Rt lumpectomy  with 2/11 lymph nodes positive. Will see oncologist tomorrow for treatment plan.    other history includes: DM, stage 3 CKD, osteoporosis    Patient Stated Goals get information from providers    Currently in Pain? No/denies                               [facility] Adult PT Treatment/Exercise - 09/21/21 0001       Manual Therapy   Myofascial Release To Rt axilla into medial upper arm    Passive ROM In Supine to Rt shoulder into flexion, abduction and D2                          PT Long Term Goals - 09/21/21 0901       PT LONG TERM GOAL #1   Title Pt will return to baseline AROM    Baseline Flex 155 and abd 169 - 09/14/21; flex 159 - 09/21/21    Status Achieved      PT LONG TERM GOAL #2   Title Pt will decrease resting axillary pain to 0/10    Baseline 2-3/10; 0/10 at rest now - 09/14/21  Status Achieved   ° °  °  ° °  ° ° ° ° ° ° ° ° Plan - 09/21/21 0857   ° ° Clinical Impression Statement Pt has made excellent progress and reports overall feeling much improved. She has met her goals and is ready for D/C at this time. She begins radiation next week and knows if she has any issues arise in the future that she can return to this clinic. She will cont every 3 month L-Dex screens.   ° Personal Factors and Comorbidities Age;Comorbidity 3+   ° Comorbidities DM, osteoporosis, CKD   ° Stability/Clinical Decision Making Stable/Uncomplicated   ° Rehab Potential Excellent   ° PT Frequency 2x / week   ° PT Duration 8 weeks   ° PT Treatment/Interventions ADLs/Self Care Home Management;Patient/family education;Therapeutic exercise;Manual techniques   ° PT Next Visit Plan D/C this visit. Cont every 3 month L-Dex screens for up to 2 years from her ALND (~07/25/23)   ° PT Home Exercise Plan Access Code: [email or web address] and post op supine   ° Consulted and Agree with Plan of Care Patient   ° °  °  ° °  ° ° °Patient will benefit from skilled therapeutic intervention in order to improve  the following deficits and impairments:  Postural dysfunction, Decreased knowledge of precautions ° °Visit Diagnosis: °Aftercare following surgery for neoplasm ° °Carcinoma of upper-inner quadrant of right breast in female, estrogen receptor positive (HCC) ° °Abnormal posture ° ° ° ° °Problem List °Patient Active Problem List  ° Diagnosis Date Noted  ° Chronic tension-type headache, not intractable 07/25/2021  ° Carcinoma of upper-inner quadrant of right breast in female, estrogen receptor positive (HCC) 06/15/2021  ° Diabetic retinopathy (HCC) 11/24/2020  ° ETD (Eustachian tube dysfunction), right 08/04/2019  ° Chronic bilateral thoracic back pain 11/21/2018  ° Type 2 diabetes mellitus with nephropathy (HCC) 10/27/2017  ° Tobacco abuse 10/27/2017  ° Nonspecific chest pain   ° Dizziness 05/16/2017  ° Osteoporosis 05/24/2016  ° CKD (chronic kidney disease) stage 3, GFR 30-59 ml/min (HCC) 05/08/2016  ° Hyperlipidemia associated with type 2 diabetes mellitus (HCC) 01/12/2016  ° Hypothyroidism 01/12/2016  ° Hypertension associated with diabetes (HCC) 01/12/2016  ° Allergic rhinitis 01/12/2016  ° Moderate major depression, single episode (HCC) 01/12/2016  ° Chronic insomnia 01/12/2016  ° GERD (gastroesophageal reflux disease) 01/12/2016  ° ° °Rosenberger, Valerie Gabbrielle, PTA °09/21/2021, 9:02 AM ° °[place] °[facility] [place] °[address] °[place], , [address] °Phone: [phone]   Fax:  [phone] ° °Name: April Ross °MRN: [id number] °Date of Birth: 07/17/1951 ° ° ° °

## 2021-09-21 NOTE — Telephone Encounter (Signed)
Oncotype results are available and I have forwarded the results to patient's radiation oncology team.  Dr. Rogue Bussing is out of the country this week and next week.  I will make sure he gets the results when he returns

## 2021-09-21 NOTE — Progress Notes (Signed)
I called the patient to let her know that we were copied on results from her Oncotype score.  We reviewed the results of her test which was 14.  It does not appear that there is benefit for chemotherapy based on the report. Her medical oncologist is not in town for the next 2 weeks, but we suspect that there would not be a recommendation for any chemotherapy. She was very excited to hear these results as it has taken almost a month to g et them. She has already simulated and is ready to start radiation next week.  We will proceed as scheduled.

## 2021-09-22 ENCOUNTER — Encounter: Payer: Self-pay | Admitting: Internal Medicine

## 2021-09-27 ENCOUNTER — Other Ambulatory Visit: Payer: Self-pay

## 2021-09-27 ENCOUNTER — Ambulatory Visit
Admission: RE | Admit: 2021-09-27 | Discharge: 2021-09-27 | Disposition: A | Discharge status: Home / Self Care | Payer: PPO | Source: Ambulatory Visit | Attending: Radiation Oncology | Admitting: Radiation Oncology

## 2021-09-27 DIAGNOSIS — Z510 Encounter for antineoplastic radiation therapy: Secondary | ICD-10-CM | POA: Diagnosis not present

## 2021-09-28 ENCOUNTER — Ambulatory Visit
Admission: RE | Admit: 2021-09-28 | Discharge: 2021-09-28 | Disposition: A | Discharge status: Home / Self Care | Payer: PPO | Source: Ambulatory Visit | Attending: Radiation Oncology | Admitting: Radiation Oncology

## 2021-09-28 DIAGNOSIS — Z510 Encounter for antineoplastic radiation therapy: Secondary | ICD-10-CM | POA: Diagnosis not present

## 2021-09-29 ENCOUNTER — Ambulatory Visit
Admission: RE | Admit: 2021-09-29 | Discharge: 2021-09-29 | Disposition: A | Discharge status: Home / Self Care | Payer: PPO | Source: Ambulatory Visit | Attending: Radiation Oncology | Admitting: Radiation Oncology

## 2021-09-29 ENCOUNTER — Other Ambulatory Visit: Payer: Self-pay

## 2021-09-29 DIAGNOSIS — Z510 Encounter for antineoplastic radiation therapy: Secondary | ICD-10-CM | POA: Diagnosis not present

## 2021-09-29 DIAGNOSIS — C50211 Malignant neoplasm of upper-inner quadrant of right female breast: Secondary | ICD-10-CM

## 2021-09-29 MED ORDER — ALRA NON-METALLIC DEODORANT (RAD-ONC)
1.0000 "application " | Freq: Once | TOPICAL | Status: AC
  Administered 2021-09-29: 1 via TOPICAL

## 2021-09-29 MED ORDER — RADIAPLEXRX EX GEL: Freq: Once | CUTANEOUS | Status: AC

## 2021-09-29 NOTE — Progress Notes (Signed)
Pt here for patient teaching.  Pt given Radiation and You booklet, skin care instructions, Alra deodorant, and Radiaplex gel.  Reviewed areas of pertinence such as fatigue, hair loss, skin changes, breast tenderness, and breast swelling . Pt able to give teach back of to pat skin and use unscented/gentle soap,apply Radiaplex bid, avoid applying anything to skin within 4 hours of treatment, avoid wearing an under wire bra, and to use an electric razor if they must shave. Pt verbalizes understanding of information given and will contact nursing with any questions or concerns.    Emeli Goguen M. Kohen Reither RN, BSN      

## 2021-10-03 ENCOUNTER — Other Ambulatory Visit: Payer: Self-pay

## 2021-10-03 ENCOUNTER — Encounter: Payer: Self-pay | Admitting: Internal Medicine

## 2021-10-03 ENCOUNTER — Ambulatory Visit
Admission: RE | Admit: 2021-10-03 | Discharge: 2021-10-03 | Disposition: A | Discharge status: Home / Self Care | Payer: PPO | Source: Ambulatory Visit | Attending: Radiation Oncology | Admitting: Radiation Oncology

## 2021-10-03 DIAGNOSIS — Z510 Encounter for antineoplastic radiation therapy: Secondary | ICD-10-CM | POA: Diagnosis not present

## 2021-10-03 NOTE — Progress Notes (Signed)
I left a voicemail for the patient/also sent my chart message that no benefit from adding chemotherapy. Recommend proceeding with radiation as planned.  Recommend follow up February 2023 first week, MD; no labs. C- please schedule.GB

## 2021-10-04 ENCOUNTER — Ambulatory Visit
Admission: RE | Admit: 2021-10-04 | Discharge: 2021-10-04 | Disposition: A | Discharge status: Home / Self Care | Payer: PPO | Source: Ambulatory Visit | Attending: Radiation Oncology | Admitting: Radiation Oncology

## 2021-10-04 ENCOUNTER — Encounter: Payer: PPO | Admitting: Rehabilitation

## 2021-10-04 DIAGNOSIS — Z510 Encounter for antineoplastic radiation therapy: Secondary | ICD-10-CM | POA: Diagnosis not present

## 2021-10-05 ENCOUNTER — Other Ambulatory Visit: Payer: Self-pay

## 2021-10-05 ENCOUNTER — Ambulatory Visit
Admission: RE | Admit: 2021-10-05 | Discharge: 2021-10-05 | Disposition: A | Discharge status: Home / Self Care | Payer: PPO | Source: Ambulatory Visit | Attending: Radiation Oncology | Admitting: Radiation Oncology

## 2021-10-05 DIAGNOSIS — Z510 Encounter for antineoplastic radiation therapy: Secondary | ICD-10-CM | POA: Diagnosis not present

## 2021-10-06 ENCOUNTER — Ambulatory Visit
Admission: RE | Admit: 2021-10-06 | Discharge: 2021-10-06 | Disposition: A | Discharge status: Home / Self Care | Payer: PPO | Source: Ambulatory Visit | Attending: Radiation Oncology | Admitting: Radiation Oncology

## 2021-10-06 ENCOUNTER — Encounter: Payer: PPO | Admitting: Rehabilitation

## 2021-10-06 DIAGNOSIS — Z510 Encounter for antineoplastic radiation therapy: Secondary | ICD-10-CM | POA: Diagnosis not present

## 2021-10-10 ENCOUNTER — Ambulatory Visit
Admission: RE | Admit: 2021-10-10 | Discharge: 2021-10-10 | Disposition: A | Discharge status: Home / Self Care | Payer: PPO | Source: Ambulatory Visit | Attending: Radiation Oncology | Admitting: Radiation Oncology

## 2021-10-10 ENCOUNTER — Other Ambulatory Visit: Payer: Self-pay

## 2021-10-10 DIAGNOSIS — Z87440 Personal history of urinary (tract) infections: Secondary | ICD-10-CM | POA: Diagnosis not present

## 2021-10-10 DIAGNOSIS — N189 Chronic kidney disease, unspecified: Secondary | ICD-10-CM | POA: Diagnosis not present

## 2021-10-10 DIAGNOSIS — E1122 Type 2 diabetes mellitus with diabetic chronic kidney disease: Secondary | ICD-10-CM | POA: Insufficient documentation

## 2021-10-10 DIAGNOSIS — Z7984 Long term (current) use of oral hypoglycemic drugs: Secondary | ICD-10-CM | POA: Diagnosis not present

## 2021-10-10 DIAGNOSIS — F17210 Nicotine dependence, cigarettes, uncomplicated: Secondary | ICD-10-CM | POA: Insufficient documentation

## 2021-10-10 DIAGNOSIS — C50211 Malignant neoplasm of upper-inner quadrant of right female breast: Secondary | ICD-10-CM | POA: Diagnosis not present

## 2021-10-10 DIAGNOSIS — M810 Age-related osteoporosis without current pathological fracture: Secondary | ICD-10-CM | POA: Diagnosis not present

## 2021-10-10 DIAGNOSIS — L598 Other specified disorders of the skin and subcutaneous tissue related to radiation: Secondary | ICD-10-CM | POA: Diagnosis not present

## 2021-10-10 DIAGNOSIS — Z170 Estrogen receptor positive status [ER+]: Secondary | ICD-10-CM | POA: Insufficient documentation

## 2021-10-10 DIAGNOSIS — Z7982 Long term (current) use of aspirin: Secondary | ICD-10-CM | POA: Insufficient documentation

## 2021-10-10 DIAGNOSIS — I129 Hypertensive chronic kidney disease with stage 1 through stage 4 chronic kidney disease, or unspecified chronic kidney disease: Secondary | ICD-10-CM | POA: Diagnosis not present

## 2021-10-10 DIAGNOSIS — Z8719 Personal history of other diseases of the digestive system: Secondary | ICD-10-CM | POA: Diagnosis not present

## 2021-10-10 DIAGNOSIS — C773 Secondary and unspecified malignant neoplasm of axilla and upper limb lymph nodes: Secondary | ICD-10-CM | POA: Insufficient documentation

## 2021-10-10 DIAGNOSIS — Z79899 Other long term (current) drug therapy: Secondary | ICD-10-CM | POA: Insufficient documentation

## 2021-10-10 DIAGNOSIS — E785 Hyperlipidemia, unspecified: Secondary | ICD-10-CM | POA: Diagnosis not present

## 2021-10-10 DIAGNOSIS — K219 Gastro-esophageal reflux disease without esophagitis: Secondary | ICD-10-CM | POA: Diagnosis not present

## 2021-10-10 DIAGNOSIS — Z510 Encounter for antineoplastic radiation therapy: Secondary | ICD-10-CM | POA: Insufficient documentation

## 2021-10-11 ENCOUNTER — Ambulatory Visit
Admission: RE | Admit: 2021-10-11 | Discharge: 2021-10-11 | Disposition: A | Discharge status: Home / Self Care | Payer: PPO | Source: Ambulatory Visit | Attending: Radiation Oncology | Admitting: Radiation Oncology

## 2021-10-11 DIAGNOSIS — Z510 Encounter for antineoplastic radiation therapy: Secondary | ICD-10-CM | POA: Diagnosis not present

## 2021-10-12 ENCOUNTER — Other Ambulatory Visit: Payer: Self-pay

## 2021-10-12 ENCOUNTER — Ambulatory Visit
Admission: RE | Admit: 2021-10-12 | Discharge: 2021-10-12 | Disposition: A | Discharge status: Home / Self Care | Payer: PPO | Source: Ambulatory Visit | Attending: Radiation Oncology | Admitting: Radiation Oncology

## 2021-10-12 DIAGNOSIS — Z510 Encounter for antineoplastic radiation therapy: Secondary | ICD-10-CM | POA: Diagnosis not present

## 2021-10-13 ENCOUNTER — Encounter: Payer: Self-pay | Admitting: Family Medicine

## 2021-10-13 ENCOUNTER — Ambulatory Visit
Admission: RE | Admit: 2021-10-13 | Discharge: 2021-10-13 | Disposition: A | Discharge status: Home / Self Care | Payer: PPO | Source: Ambulatory Visit | Attending: Radiation Oncology | Admitting: Radiation Oncology

## 2021-10-13 DIAGNOSIS — Z510 Encounter for antineoplastic radiation therapy: Secondary | ICD-10-CM | POA: Diagnosis not present

## 2021-10-16 ENCOUNTER — Other Ambulatory Visit: Payer: Self-pay

## 2021-10-16 ENCOUNTER — Ambulatory Visit
Admission: RE | Admit: 2021-10-16 | Discharge: 2021-10-16 | Disposition: A | Discharge status: Home / Self Care | Payer: PPO | Source: Ambulatory Visit | Attending: Radiation Oncology | Admitting: Radiation Oncology

## 2021-10-16 DIAGNOSIS — Z510 Encounter for antineoplastic radiation therapy: Secondary | ICD-10-CM | POA: Diagnosis not present

## 2021-10-17 ENCOUNTER — Encounter: Payer: Self-pay | Admitting: *Deleted

## 2021-10-17 ENCOUNTER — Telehealth: Payer: Self-pay | Admitting: Hematology and Oncology

## 2021-10-17 ENCOUNTER — Ambulatory Visit
Admission: RE | Admit: 2021-10-17 | Discharge: 2021-10-17 | Disposition: A | Discharge status: Home / Self Care | Payer: PPO | Source: Ambulatory Visit | Attending: Radiation Oncology | Admitting: Radiation Oncology

## 2021-10-17 DIAGNOSIS — Z510 Encounter for antineoplastic radiation therapy: Secondary | ICD-10-CM | POA: Diagnosis not present

## 2021-10-17 NOTE — Telephone Encounter (Signed)
Called pt to sch a new pt appt with Dr. Lindi Adie per 1/10 staff msg from [facility]. Left msg for pt to call back to sch appt.

## 2021-10-17 NOTE — Telephone Encounter (Signed)
Scheduled appt per 1/9 staff msg from [facility]. Spoke to pt who is aware of appt date and time.

## 2021-10-18 ENCOUNTER — Other Ambulatory Visit: Payer: Self-pay

## 2021-10-18 ENCOUNTER — Ambulatory Visit
Admission: RE | Admit: 2021-10-18 | Discharge: 2021-10-18 | Disposition: A | Discharge status: Home / Self Care | Payer: PPO | Source: Ambulatory Visit | Attending: Radiation Oncology | Admitting: Radiation Oncology

## 2021-10-18 DIAGNOSIS — Z510 Encounter for antineoplastic radiation therapy: Secondary | ICD-10-CM | POA: Diagnosis not present

## 2021-10-19 ENCOUNTER — Ambulatory Visit
Admission: RE | Admit: 2021-10-19 | Discharge: 2021-10-19 | Disposition: A | Discharge status: Home / Self Care | Payer: PPO | Source: Ambulatory Visit | Attending: Radiation Oncology | Admitting: Radiation Oncology

## 2021-10-19 DIAGNOSIS — Z510 Encounter for antineoplastic radiation therapy: Secondary | ICD-10-CM | POA: Diagnosis not present

## 2021-10-20 ENCOUNTER — Ambulatory Visit
Admission: RE | Admit: 2021-10-20 | Discharge: 2021-10-20 | Disposition: A | Discharge status: Home / Self Care | Payer: PPO | Source: Ambulatory Visit | Attending: Radiation Oncology | Admitting: Radiation Oncology

## 2021-10-20 ENCOUNTER — Other Ambulatory Visit: Payer: Self-pay

## 2021-10-20 ENCOUNTER — Other Ambulatory Visit: Payer: Self-pay | Admitting: Radiation Oncology

## 2021-10-20 DIAGNOSIS — Z510 Encounter for antineoplastic radiation therapy: Secondary | ICD-10-CM | POA: Diagnosis not present

## 2021-10-20 MED ORDER — DOXYCYCLINE HYCLATE 100 MG PO TABS: 100.0000 mg | ORAL_TABLET | Freq: Two times a day (BID) | ORAL | 0 refills | Status: DC

## 2021-10-23 ENCOUNTER — Ambulatory Visit
Admission: RE | Admit: 2021-10-23 | Discharge: 2021-10-23 | Disposition: A | Discharge status: Home / Self Care | Payer: PPO | Source: Ambulatory Visit | Attending: Radiation Oncology | Admitting: Radiation Oncology

## 2021-10-23 ENCOUNTER — Other Ambulatory Visit: Payer: Self-pay

## 2021-10-23 DIAGNOSIS — Z510 Encounter for antineoplastic radiation therapy: Secondary | ICD-10-CM | POA: Diagnosis not present

## 2021-10-24 ENCOUNTER — Ambulatory Visit
Admission: RE | Admit: 2021-10-24 | Discharge: 2021-10-24 | Disposition: A | Discharge status: Home / Self Care | Payer: PPO | Source: Ambulatory Visit | Attending: Radiation Oncology | Admitting: Radiation Oncology

## 2021-10-24 DIAGNOSIS — Z510 Encounter for antineoplastic radiation therapy: Secondary | ICD-10-CM | POA: Diagnosis not present

## 2021-10-25 ENCOUNTER — Ambulatory Visit: Payer: PPO | Attending: General Surgery | Admitting: Rehabilitation

## 2021-10-25 ENCOUNTER — Ambulatory Visit
Admission: RE | Admit: 2021-10-25 | Discharge: 2021-10-25 | Disposition: A | Discharge status: Home / Self Care | Payer: PPO | Source: Ambulatory Visit | Attending: Radiation Oncology | Admitting: Radiation Oncology

## 2021-10-25 ENCOUNTER — Encounter: Payer: Self-pay | Admitting: Radiation Oncology

## 2021-10-25 ENCOUNTER — Other Ambulatory Visit: Payer: Self-pay

## 2021-10-25 DIAGNOSIS — R293 Abnormal posture: Secondary | ICD-10-CM | POA: Insufficient documentation

## 2021-10-25 DIAGNOSIS — Z170 Estrogen receptor positive status [ER+]: Secondary | ICD-10-CM | POA: Insufficient documentation

## 2021-10-25 DIAGNOSIS — Z483 Aftercare following surgery for neoplasm: Secondary | ICD-10-CM | POA: Insufficient documentation

## 2021-10-25 DIAGNOSIS — Z510 Encounter for antineoplastic radiation therapy: Secondary | ICD-10-CM | POA: Diagnosis not present

## 2021-10-25 DIAGNOSIS — C50211 Malignant neoplasm of upper-inner quadrant of right female breast: Secondary | ICD-10-CM | POA: Insufficient documentation

## 2021-10-25 NOTE — Therapy (Signed)
[facility] @ [place], [place], [address] Phone: [phone]   Fax:  [phone]  Physical Therapy Treatment  Patient Details  Name: April Ross MRN: [id number] Date of Birth: 1951-04-26 Referring Provider (PT): Dr. Marlou Starks   Encounter Date: 10/25/2021   PT End of Session - 10/25/21 0814     Visit Number 7   screen only   Number of Visits 18    PT Start Time 0800    PT Stop Time 0810    PT Time Calculation (min) 10 min    Activity Tolerance Patient tolerated treatment well    Behavior During Therapy [facility] for tasks assessed/performed             Past Medical History:  Diagnosis Date   Allergy    Arthritis    CRD (chronic renal disease)    stage 3   Depression    Diabetes mellitus without complication (HCC)    GERD (gastroesophageal reflux disease)    Hyperlipidemia    Hypertension    LBBB (left bundle branch block)    a. diagnosed in 10/2017 - echo showed a preserved EF of 55-60% and NST was low-risk showing no evidence of ischemia.    Osteoporosis    polyps in colon    Thyroid disease    Urinary tract infection     Past Surgical History:  Procedure Laterality Date   ABDOMINAL HYSTERECTOMY     APPENDECTOMY     BREAST LUMPECTOMY WITH RADIOACTIVE SEED AND SENTINEL LYMPH NODE BIOPSY Right 07/24/2021   Procedure: RIGHT BREAST LUMPECTOMY WITH RADIOACTIVE SEED AND SENTINEL LYMPH NODE BIOPSY;  Surgeon: Jovita Kussmaul, MD;  Location: [facility];  Service: General;  Laterality: Right;    There were no vitals filed for this visit.   Subjective Assessment - 10/25/21 0801     Subjective The place under my arm got infected    Pertinent History Pt will be having a Rt lumpectomy with 2/11 lymph nodes positive. Will see oncologist tomorrow for treatment plan.    other history includes: DM, stage 3 CKD, osteoporosis    Currently in Pain? Yes    Pain Score 5     Pain Location Axilla     Pain Orientation Right    Pain Descriptors / Indicators Sore    Pain Type Surgical pain    Pain Frequency Constant                    L-DEX FLOWSHEETS - 10/25/21 0800       L-DEX LYMPHEDEMA SCREENING   Measurement Type Unilateral    L-DEX MEASUREMENT EXTREMITY Upper Extremity    POSITION  Standing    DOMINANT SIDE Right    At Risk Side Right    BASELINE SCORE (UNILATERAL) -5.9    L-DEX SCORE (UNILATERAL) 4.9    VALUE CHANGE (UNILAT) 10.8    Comment this score triggers into the red but due to active infection we will retest in 3 weeks as long as infection is more under control                                     PT Long Term Goals - 09/21/21 0901       PT LONG TERM GOAL #1   Title Pt will return to baseline AROM    Baseline Flex 155  and abd 169 - 09/14/21; flex 159 - 09/21/21    Status Achieved      PT LONG TERM GOAL #2   Title Pt will decrease resting axillary pain to 0/10    Baseline 2-3/10; 0/10 at rest now - 09/14/21    Status Achieved                   Plan - 10/25/21 0815     Clinical Impression Statement SOZO score increase of 10.5 triggers into the red but due to active infection we will retest in 3 weeks as long as infection is more under control             Patient will benefit from skilled therapeutic intervention in order to improve the following deficits and impairments:     Visit Diagnosis: Aftercare following surgery for neoplasm  Carcinoma of upper-inner quadrant of right breast in female, estrogen receptor positive ([place])  Abnormal posture     Problem List Patient Active Problem List   Diagnosis Date Noted   Chronic tension-type headache, not intractable 07/25/2021   Carcinoma of upper-inner quadrant of right breast in female, estrogen receptor positive ([place]) 06/15/2021   Diabetic retinopathy ([place]) 11/24/2020   ETD (Eustachian tube dysfunction), right 08/04/2019   Chronic bilateral thoracic  back pain 11/21/2018   Type 2 diabetes mellitus with nephropathy ([place]) 10/27/2017   Tobacco abuse 10/27/2017   Nonspecific chest pain    Dizziness 05/16/2017   Osteoporosis 05/24/2016   CKD (chronic kidney disease) stage 3, GFR 30-59 ml/min ([place]) 05/08/2016   Hyperlipidemia associated with type 2 diabetes mellitus ([place]) 01/12/2016   Hypothyroidism 01/12/2016   Hypertension associated with diabetes ([place]) 01/12/2016   Allergic rhinitis 01/12/2016   Moderate major depression, single episode ([place]) 01/12/2016   Chronic insomnia 01/12/2016   GERD (gastroesophageal reflux disease) 01/12/2016    Stark Bray, PT 10/25/2021, 8:16 AM  [facility] @ [place], [place], [address] Phone: [phone]   Fax:  [phone]  Name: April Ross MRN: [id number] Date of Birth: Dec 14, 1950

## 2021-10-26 ENCOUNTER — Encounter: Payer: Self-pay | Admitting: Family Medicine

## 2021-10-26 ENCOUNTER — Ambulatory Visit
Admission: RE | Admit: 2021-10-26 | Discharge: 2021-10-26 | Disposition: A | Discharge status: Home / Self Care | Payer: PPO | Source: Ambulatory Visit | Attending: Radiation Oncology | Admitting: Radiation Oncology

## 2021-10-26 ENCOUNTER — Other Ambulatory Visit: Payer: Self-pay | Admitting: Family Medicine

## 2021-10-26 DIAGNOSIS — Z510 Encounter for antineoplastic radiation therapy: Secondary | ICD-10-CM | POA: Diagnosis not present

## 2021-10-27 ENCOUNTER — Other Ambulatory Visit: Payer: Self-pay | Admitting: Family Medicine

## 2021-10-27 ENCOUNTER — Other Ambulatory Visit: Payer: Self-pay

## 2021-10-27 ENCOUNTER — Ambulatory Visit
Admission: RE | Admit: 2021-10-27 | Discharge: 2021-10-27 | Disposition: A | Discharge status: Home / Self Care | Payer: PPO | Source: Ambulatory Visit | Attending: Radiation Oncology | Admitting: Radiation Oncology

## 2021-10-27 DIAGNOSIS — Z510 Encounter for antineoplastic radiation therapy: Secondary | ICD-10-CM | POA: Diagnosis not present

## 2021-10-27 MED ORDER — GLYBURIDE 5 MG PO TABS: 10.0000 mg | ORAL_TABLET | Freq: Two times a day (BID) | ORAL | 1 refills | Status: DC

## 2021-10-30 ENCOUNTER — Ambulatory Visit
Admission: RE | Admit: 2021-10-30 | Discharge: 2021-10-30 | Disposition: A | Discharge status: Home / Self Care | Payer: PPO | Source: Ambulatory Visit | Attending: Radiation Oncology | Admitting: Radiation Oncology

## 2021-10-30 ENCOUNTER — Other Ambulatory Visit: Payer: Self-pay

## 2021-10-30 DIAGNOSIS — Z510 Encounter for antineoplastic radiation therapy: Secondary | ICD-10-CM | POA: Diagnosis not present

## 2021-10-31 ENCOUNTER — Ambulatory Visit
Admission: RE | Admit: 2021-10-31 | Discharge: 2021-10-31 | Disposition: A | Discharge status: Home / Self Care | Payer: PPO | Source: Ambulatory Visit | Attending: Radiation Oncology | Admitting: Radiation Oncology

## 2021-10-31 DIAGNOSIS — Z510 Encounter for antineoplastic radiation therapy: Secondary | ICD-10-CM | POA: Diagnosis not present

## 2021-11-01 ENCOUNTER — Ambulatory Visit
Admission: RE | Admit: 2021-11-01 | Discharge: 2021-11-01 | Disposition: A | Discharge status: Home / Self Care | Payer: PRIVATE HEALTH INSURANCE | Source: Ambulatory Visit | Attending: Radiation Oncology | Admitting: Radiation Oncology

## 2021-11-01 ENCOUNTER — Telehealth: Payer: Self-pay | Admitting: *Deleted

## 2021-11-01 ENCOUNTER — Other Ambulatory Visit: Payer: Self-pay | Admitting: Radiation Oncology

## 2021-11-01 ENCOUNTER — Encounter: Payer: Self-pay | Admitting: Radiation Oncology

## 2021-11-01 ENCOUNTER — Ambulatory Visit
Admission: RE | Admit: 2021-11-01 | Discharge: 2021-11-01 | Disposition: A | Discharge status: Home / Self Care | Payer: PPO | Source: Ambulatory Visit | Attending: Radiation Oncology | Admitting: Radiation Oncology

## 2021-11-01 ENCOUNTER — Other Ambulatory Visit: Payer: Self-pay

## 2021-11-01 DIAGNOSIS — C50211 Malignant neoplasm of upper-inner quadrant of right female breast: Secondary | ICD-10-CM

## 2021-11-01 DIAGNOSIS — Z510 Encounter for antineoplastic radiation therapy: Secondary | ICD-10-CM | POA: Diagnosis not present

## 2021-11-01 DIAGNOSIS — Z170 Estrogen receptor positive status [ER+]: Secondary | ICD-10-CM

## 2021-11-01 DIAGNOSIS — L02411 Cutaneous abscess of right axilla: Secondary | ICD-10-CM | POA: Diagnosis not present

## 2021-11-01 IMAGING — US US BREAST*R* LIMITED INC AXILLA
1 series · 7 of 7 positions shown · non-contrast
Comparison: Previous exam(s).

CLINICAL DATA: 70-year-old female with LOWER RIGHT axillary pain
and redness, increasing over the last 3 weeks. Patient is currently
on her 3rd round of antibiotics. History of RIGHT breast cancer with
RIGHT lumpectomy and RIGHT axillary sentinel lymph node biopsy on
[DATE] and currently undergoing radiation therapy.

EXAM:
ULTRASOUND OF THE RIGHT BREAST

[Series 1: us breast*right* limited inc axilla · 0.09mm/px · 7 of 7 slices shown]
[im 1/7]
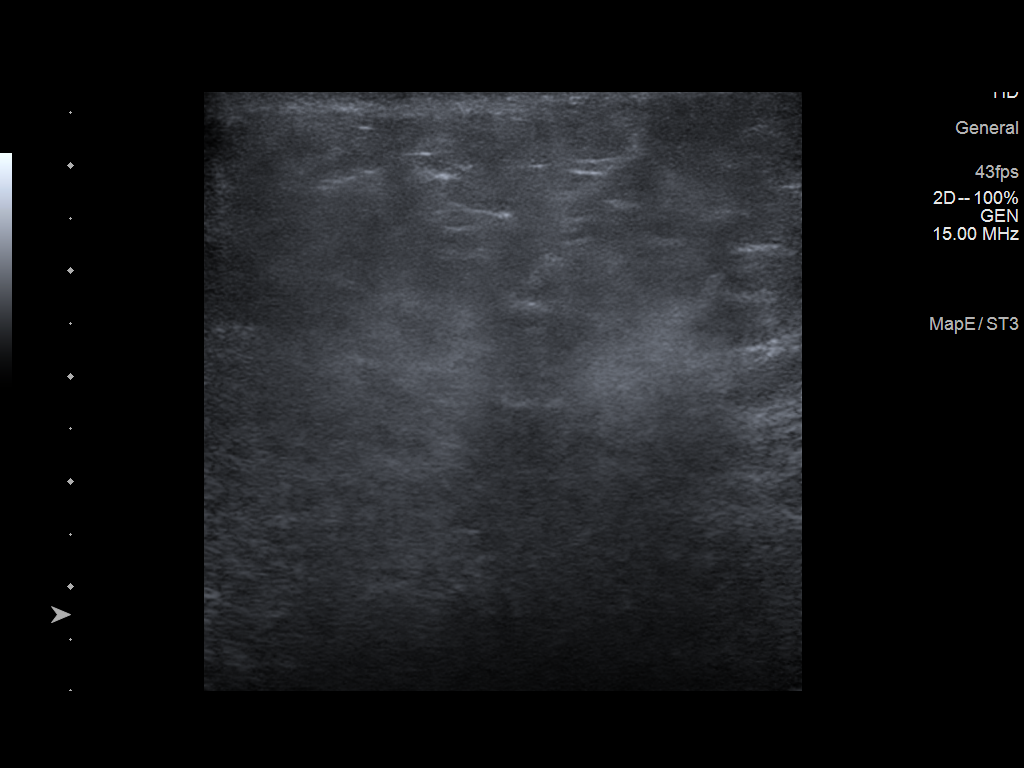
[im 2/7]
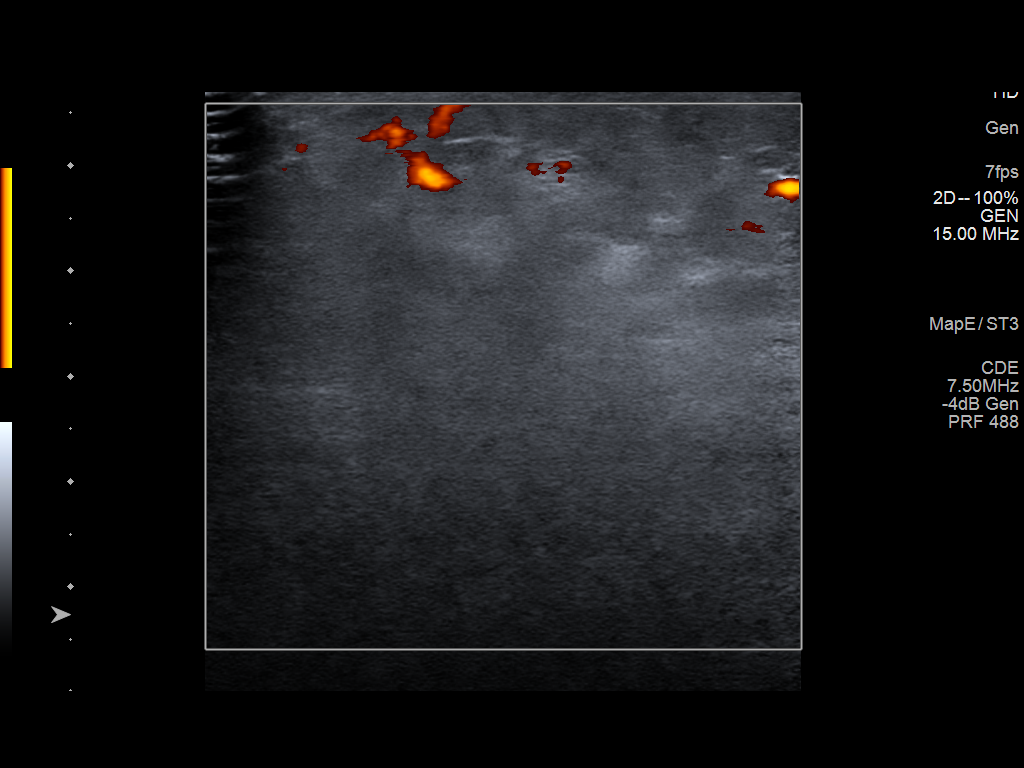
[im 3/7]
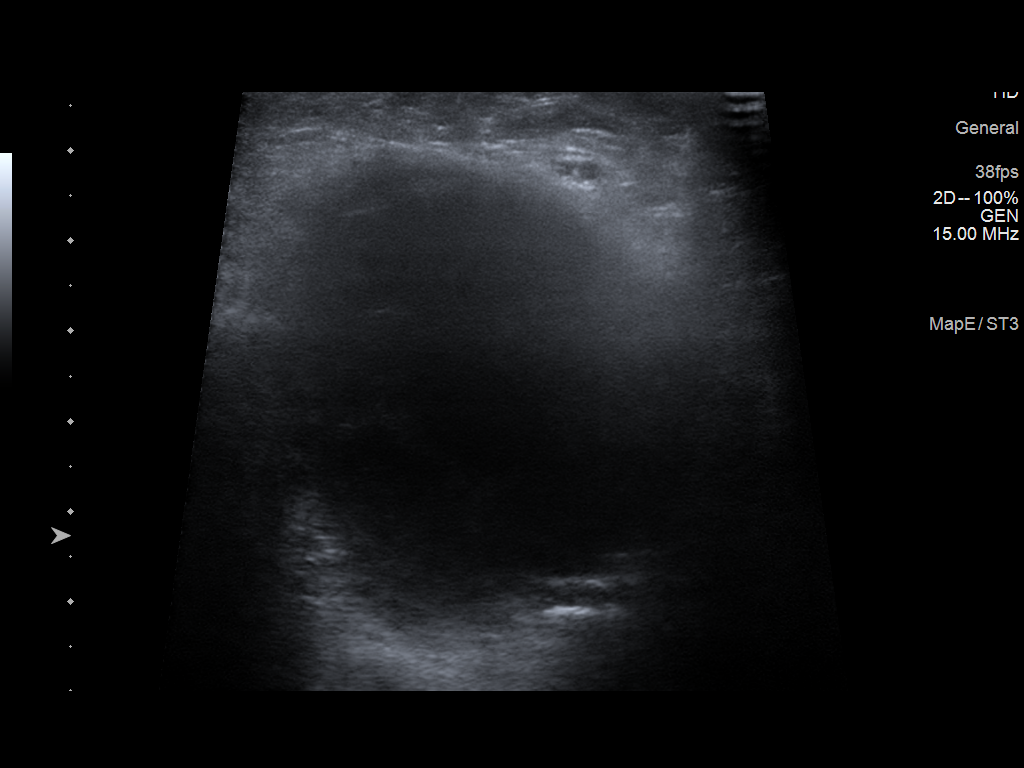
[im 4/7]
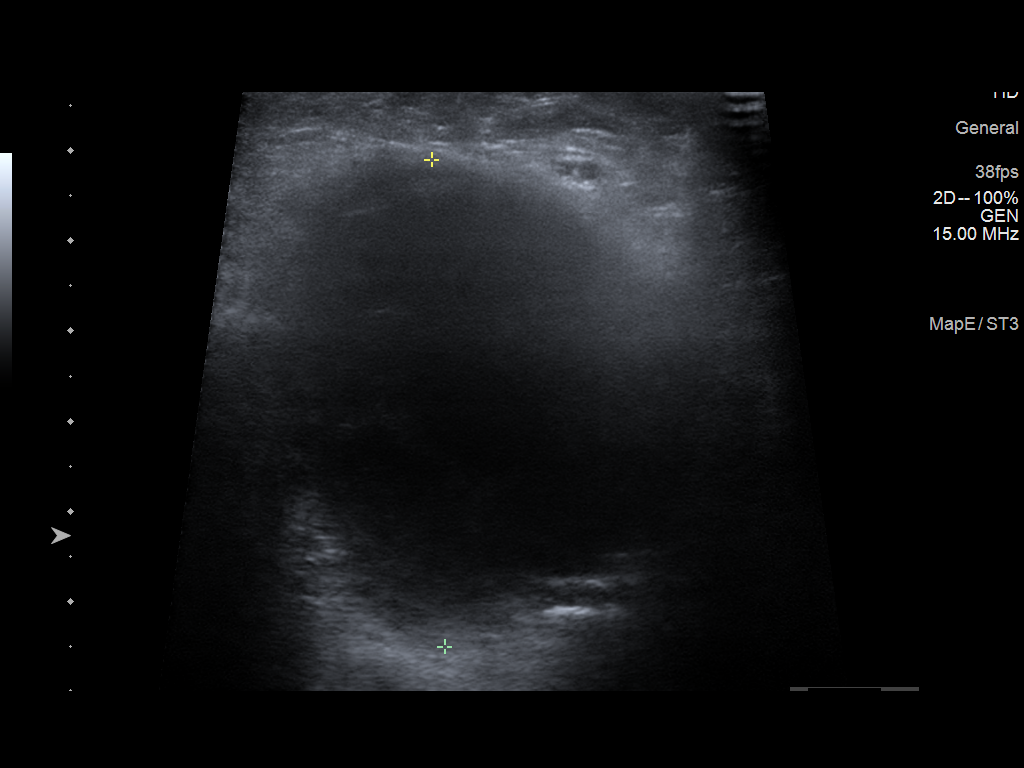
[im 5/7]
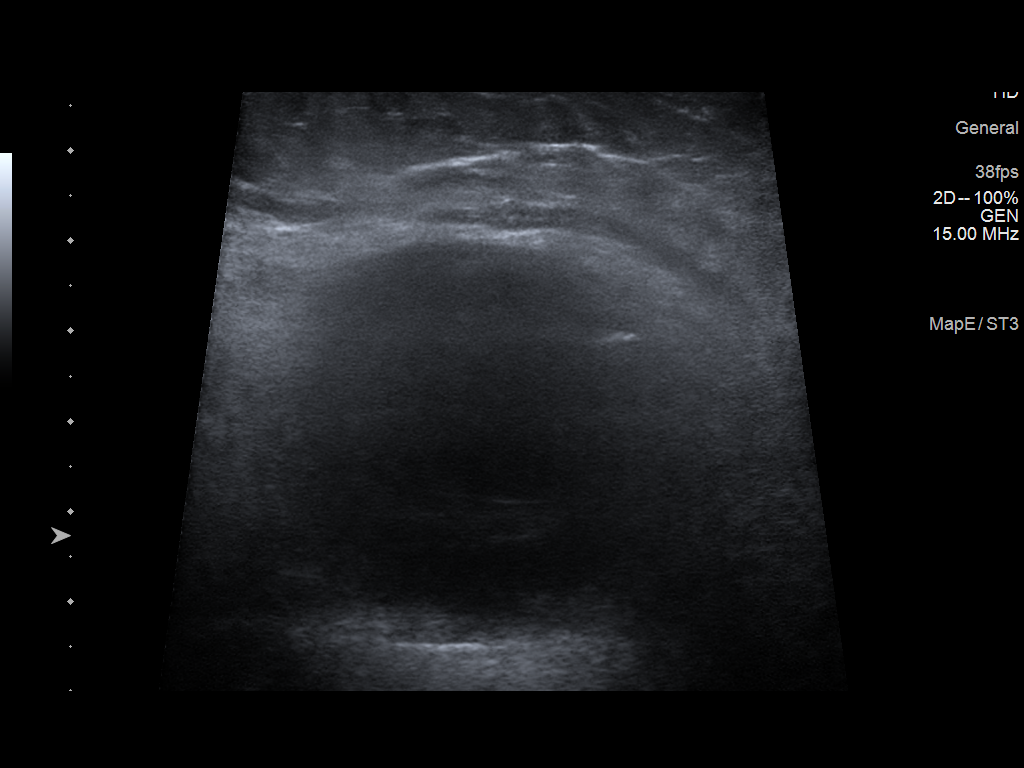
[im 6/7]
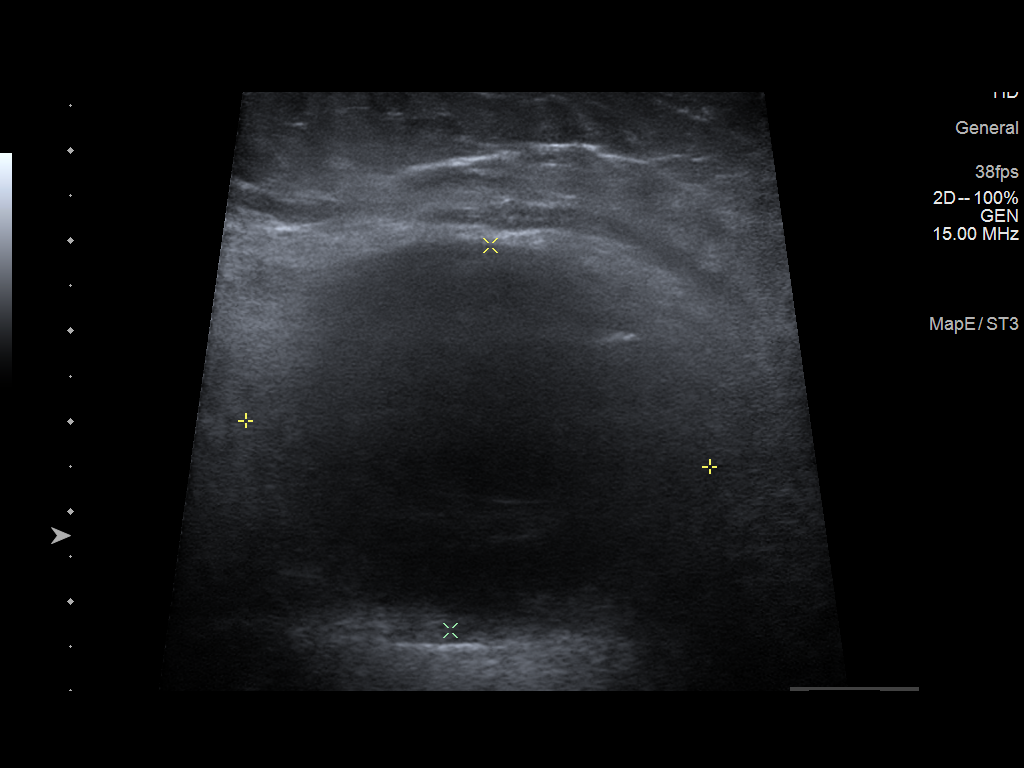
[im 7/7]
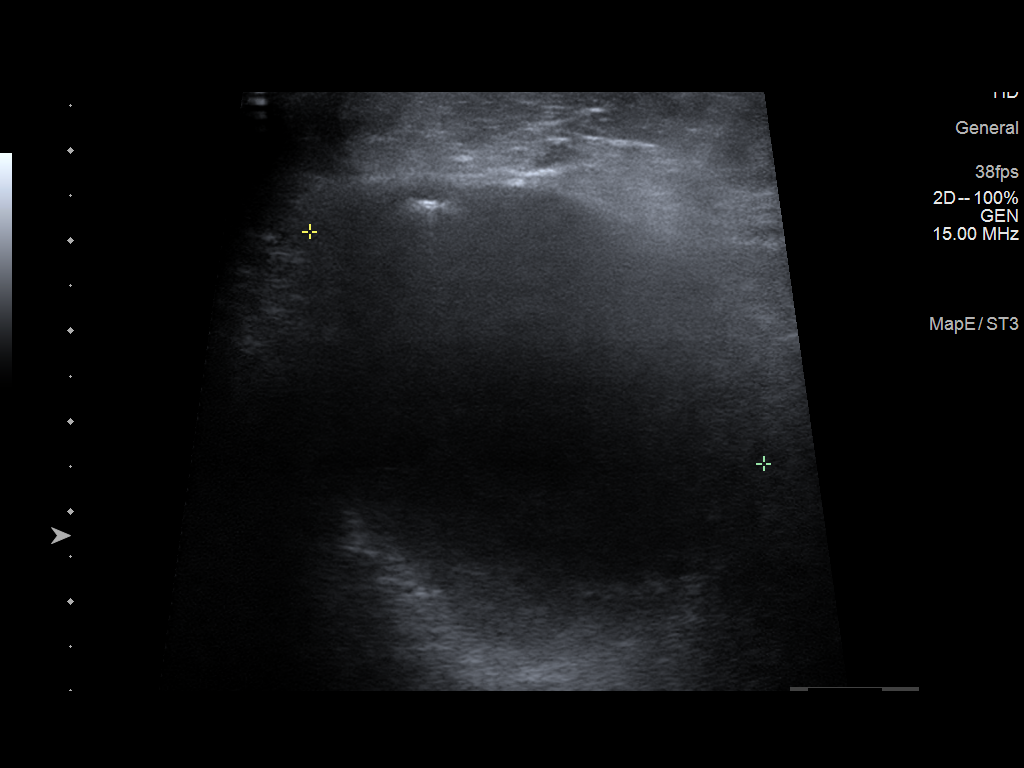

[7 of 7 positions shown; findings below may reference images not displayed]

FINDINGS: The patient could not tolerate a mammogram due to pain.

On physical exam, the UPPER-OUTER RIGHT breast/LOWER RIGHT axilla is
swollen and erythematous.

Targeted ultrasound is performed, showing a 5.2 x 4.3 x 5.7 cm near
anechoic area deep within the LOWER RIGHT axilla, which may
represent a postoperative changes/collection or infected collection.
IMPRESSION: 5.7 cm deep LOWER RIGHT axillary near anechoic area, likely
postoperative changes/collection or possibly infected collection.
This was discussed with Dr. ERIKSEN and we agree to proceed with
ultrasound-guided aspiration at this time as surgical I and D would
not likely be able to performed for several days.

Overlying erythema may represent infection versus radiation changes.

RECOMMENDATION:
Ultrasound-guided RIGHT LOWER axillary aspiration, which will be
performed today but dictated in a separate report.

I have discussed the findings and recommendations with the patient.
If applicable, a reminder letter will be sent to the patient
regarding the next appointment.

BI-RADS CATEGORY  2: Benign.

## 2021-11-01 IMAGING — US US ASPIRATION RIGHT BREAST
1 series · 5 of 5 positions shown · non-contrast
Comparison: Previous exams.
COMPARISON: Previous exams.

Addendum:
CLINICAL DATA: 70-year-old female for aspiration of 5.7 cm deep
LOWER RIGHT axillary collection.

EXAM:
ULTRASOUND GUIDED RIGHT BREAST ASPIRATION

[Series 1: us aspiration right breast · 0.09mm/px · 5 of 5 slices shown]
[im 1/5]
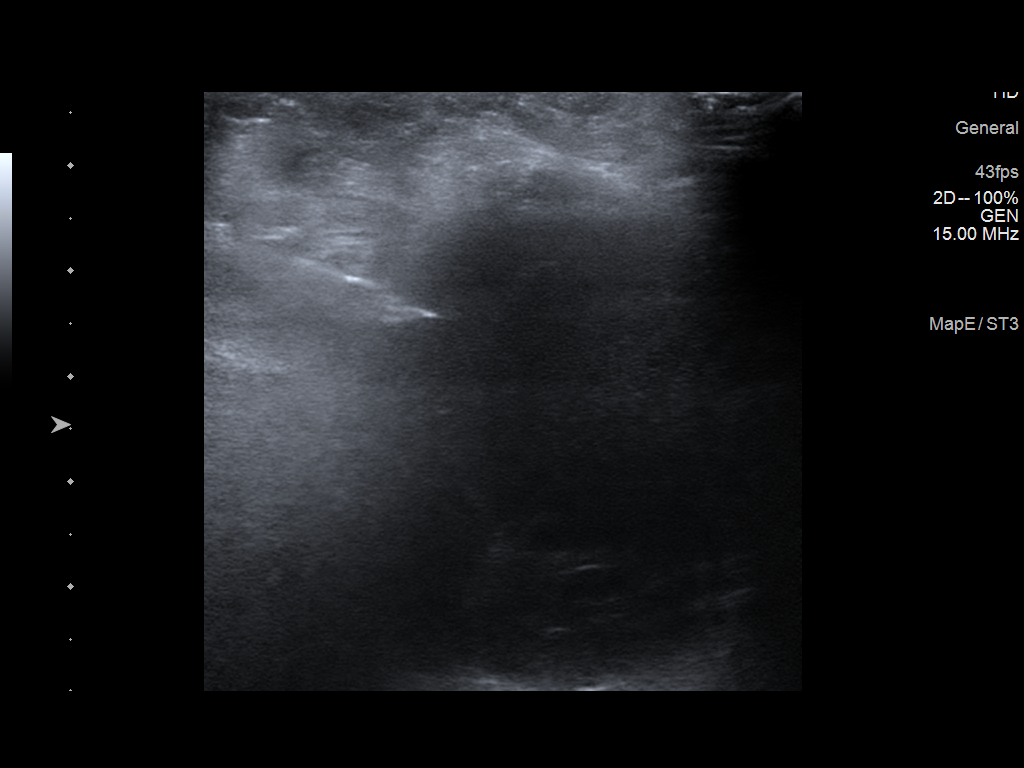
[im 2/5]
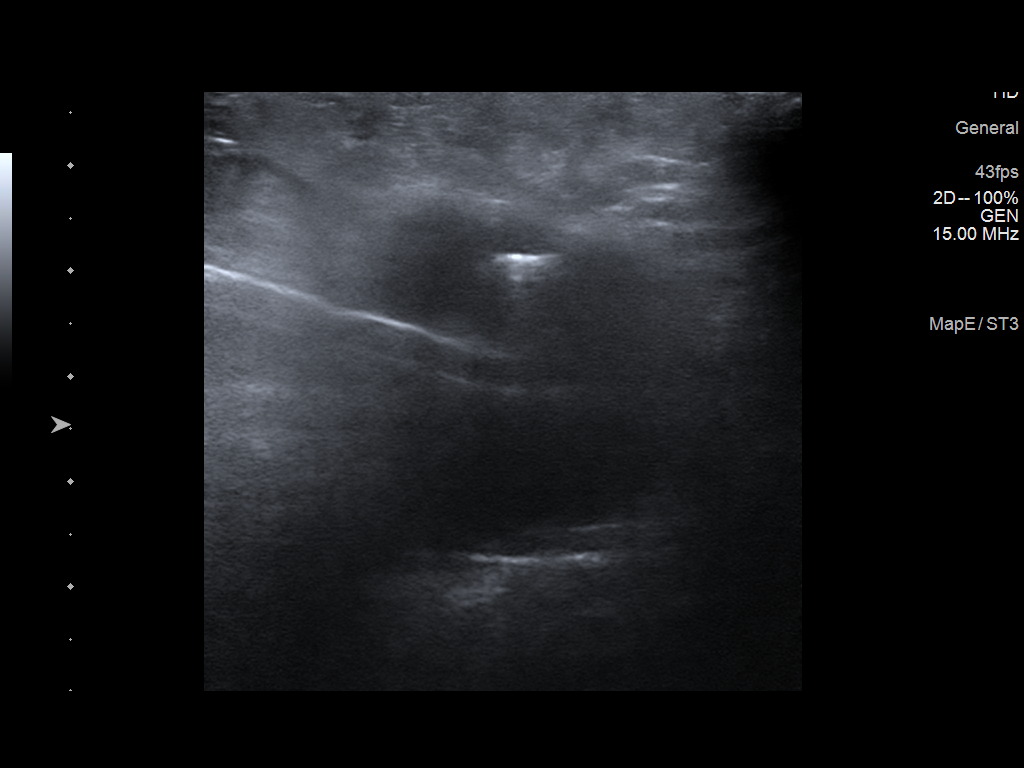
[im 3/5]
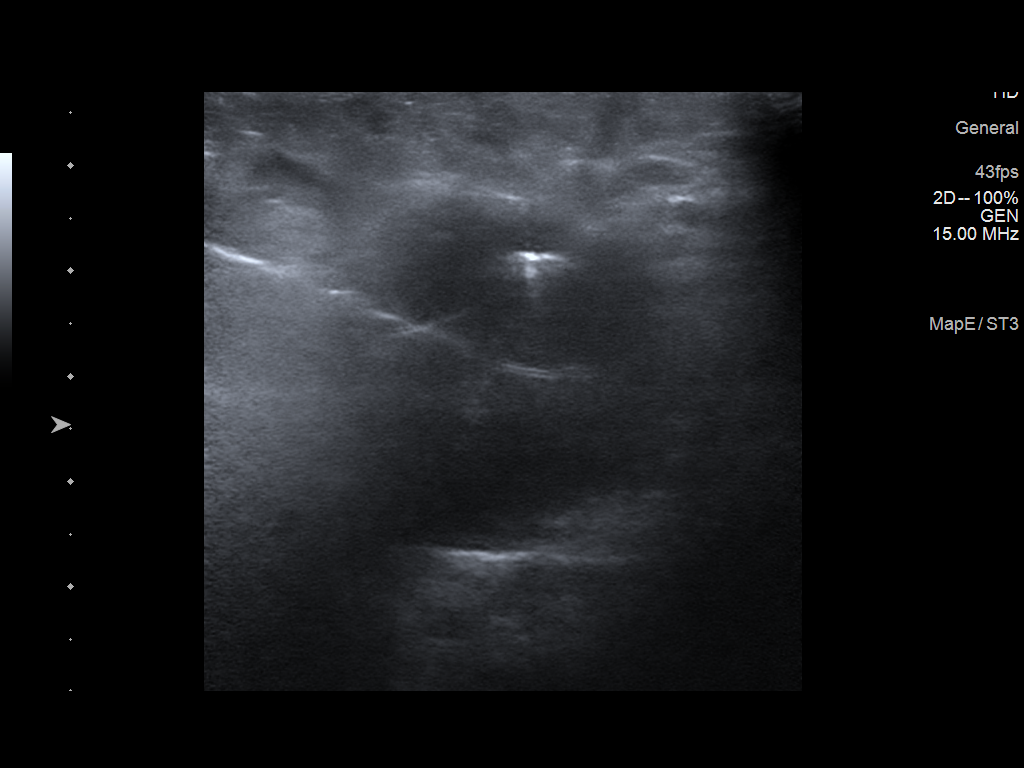
[im 4/5]
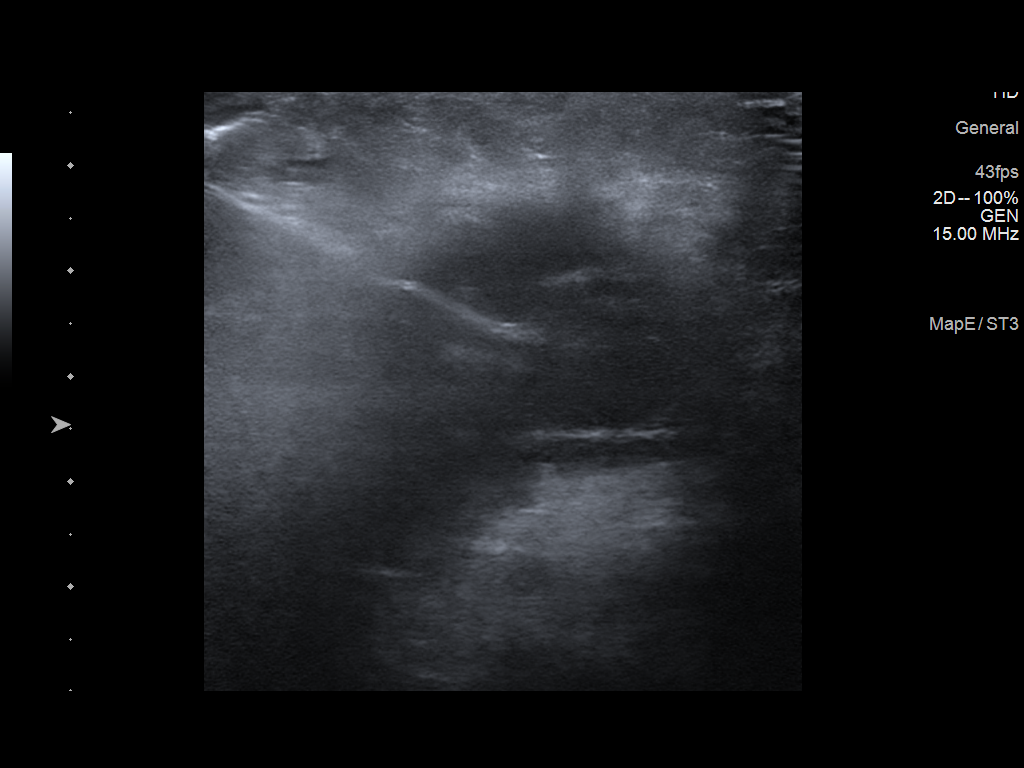
[im 5/5]
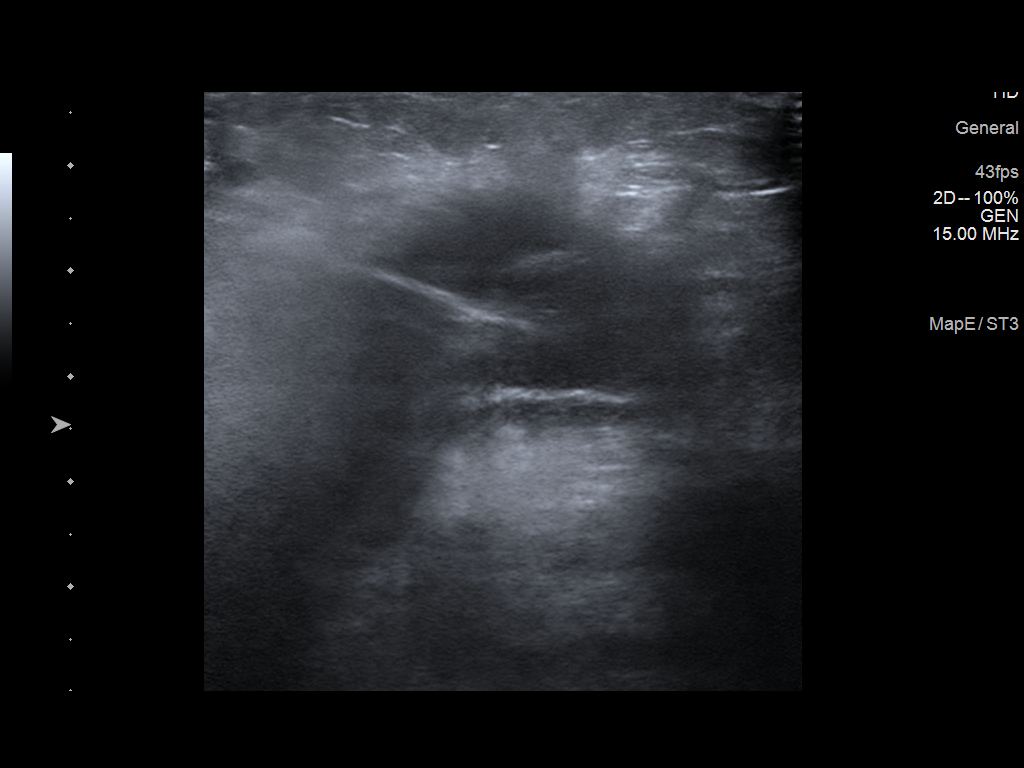

[5 of 5 positions shown; findings below may reference images not displayed]

PROCEDURE:
The patient and I discussed the procedure of ultrasound-guided
aspiration including benefits and alternatives. We discussed the
high likelihood of a successful procedure. We discussed the risks of
the procedure including infection, bleeding, tissue injury, and
inadequate sampling. Informed written consent was given. The usual
time out protocol was performed immediately prior to the procedure.

Using sterile technique, 1% lidocaine with and without epinephrine,
under direct ultrasound visualization, needle aspiration of the
x 4.3 x 5.7 cm deep LOWER RIGHT axillary collection was performed.
40 cc of yellow serous fluid containing some small solid whitish
material was aspirated from the collection and a portion sent to
microbiology for further evaluation.

A residual 1.8 x 2.8 cm hypoechoic area in the deep LOWER RIGHT
axilla was noted following aspiration, which more likely represents
residual postoperative changes with some fluid in this area
possible.
IMPRESSION: Ultrasound-guided aspiration of deep LOWER RIGHT axillary collection
with aspiration of 40 cc of yellow serous fluid containing some
small solid whitish material, which was sent to microbiology for
further analysis. 1.8 x 2.8 cm residual hypoechoic area, favoring
postoperative changes with some possible fluid remaining. No
apparent complications.

RECOMMENDATIONS:
RIGHT breast/axillary ultrasound follow-up in 5 days.

The patient was instructed to continue her current antibiotic
regimen.

ADDENDUM:
RIGHT axillary aspiration yielded Gram Stain-FEW WBC PRESENT,
PREDOMINANTLY MONONUCLEAR, NO ORGANISMS SEEN. Culture No growth
aerobically or anaerobically.

The patient was notified of results by Dr. SEAY at the
time of RIGHT axillary ultrasound on [DATE]. This
procedure is dictated in a separate report. The patient reported the
swelling in the lower posterior RIGHT axilla has improved with
aspiration, but she has persistent severe pain and redness. She is
still currently on antibiotics, though has not had much improvement.

The patient was instructed to continue antibiotics as recommended
and follow-up with Dr. SEAY. Imaging follow-up as clinically guided.
If no further imaging follow-up is necessary for her current
problem, she will be due for her bilateral mammogram in [DATE].

Pathology results reported by SEAY, RN on [DATE].

*** End of Addendum ***
PROCEDURE:
The patient and I discussed the procedure of ultrasound-guided
aspiration including benefits and alternatives. We discussed the
high likelihood of a successful procedure. We discussed the risks of
the procedure including infection, bleeding, tissue injury, and
inadequate sampling. Informed written consent was given. The usual
time out protocol was performed immediately prior to the procedure.

Using sterile technique, 1% lidocaine with and without epinephrine,
under direct ultrasound visualization, needle aspiration of the
x 4.3 x 5.7 cm deep LOWER RIGHT axillary collection was performed.
40 cc of yellow serous fluid containing some small solid whitish
material was aspirated from the collection and a portion sent to
microbiology for further evaluation.

A residual 1.8 x 2.8 cm hypoechoic area in the deep LOWER RIGHT
axilla was noted following aspiration, which more likely represents
residual postoperative changes with some fluid in this area
possible.
IMPRESSION: Ultrasound-guided aspiration of deep LOWER RIGHT axillary collection
with aspiration of 40 cc of yellow serous fluid containing some
small solid whitish material, which was sent to microbiology for
further analysis. 1.8 x 2.8 cm residual hypoechoic area, favoring
postoperative changes with some possible fluid remaining. No
apparent complications.

RECOMMENDATIONS:
RIGHT breast/axillary ultrasound follow-up in 5 days.

The patient was instructed to continue her current antibiotic
regimen.

## 2021-11-01 MED ORDER — AMOXICILLIN-POT CLAVULANATE 875-125 MG PO TABS: 1.0000 | ORAL_TABLET | Freq: Two times a day (BID) | ORAL | 0 refills | Status: DC

## 2021-11-01 NOTE — Progress Notes (Addendum)
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  2 of her 11 lymph nodes were removed at the time of surgery in October 2022 showed metastatic disease.  She had Oncotype score which was low risk.  She began adjuvant radiotherapy to the right breast and regional lymph nodes and has since completed 24 of the 28 fractions to the whole breast and has a boost of 5 fractions also remaining.  Last week she developed redness and discomfort at the level of the axilla and was treated empirically with doxycycline.  Dr. Marlou Starks her surgeon has been aware of her case and recommended that she be evaluated if her symptoms did not improve.  Today she is seen with persistent pain with sensitivity of the skin, and  heat over the level of the right axilla. The discomfort is minimally helped by ultram, and she cannot comfortably relax her right arm due to sensitivity with the skin touching. She's not had any fevers, drainage, or separation of the incision line. She reports she completed her doxycycline on Monday of this week.   In clinic she is afebrile with stable vitals.  In general this is a well appearing caucasian female in no acute distress. She's alert and oriented x4 and appropriate throughout the examination. Cardiopulmonary assessment is negative for acute distress and she exhibits normal effort.  She has anticipated skin changes in the treatment field, but firm induration deep to the surgical site that is somewhat fixed, slight erythema more medially is also noted but no punctate changes or separation of her incision is noted. Her skin is quite sensitive to light touch.   We will contact Dr. Ethlyn Gallery office and or see if one of the breast surgeons can see her while they're on campus for breast oncology clinic. If she cannot be seen today, we will arrange an appointment tomorrow in the surgical office and hold tomorrow's treatment.      Carola Rhine, PAC

## 2021-11-01 NOTE — Telephone Encounter (Signed)
RETURNED PATIENT'S PHONE CALL, SPOKE WITH PATIENT. ?

## 2021-11-01 NOTE — Progress Notes (Addendum)
I spoke with Dr. Barry Dienes since she is on campus and since Dr. Marlou Starks and Emilie Rutter, PA are in the OR this am. She recommended augmention for more anerobic coverage, and ultrasound to evaluate delayed seroma, and adding  to her dose of Neurontin for pain.

## 2021-11-01 NOTE — Telephone Encounter (Signed)
Called patient to inform that [facility] has called and can do her ultrasound today- arrival time- 2:30 pm, spoke with patient and she is aware of this test.

## 2021-11-01 NOTE — Telephone Encounter (Signed)
Spoke with the patient to let her know that she is cleared to resume her radiation treatments tomorrow per Dr Lisbeth Renshaw.  She verbalized understanding.  Gloriajean Dell. Leonie Green, BSN

## 2021-11-02 ENCOUNTER — Ambulatory Visit
Admission: RE | Admit: 2021-11-02 | Discharge: 2021-11-02 | Disposition: A | Discharge status: Home / Self Care | Payer: PPO | Source: Ambulatory Visit | Attending: Radiation Oncology | Admitting: Radiation Oncology

## 2021-11-02 ENCOUNTER — Other Ambulatory Visit: Payer: Self-pay | Admitting: Family Medicine

## 2021-11-02 ENCOUNTER — Other Ambulatory Visit: Payer: Self-pay | Admitting: Radiation Oncology

## 2021-11-02 DIAGNOSIS — Z510 Encounter for antineoplastic radiation therapy: Secondary | ICD-10-CM | POA: Diagnosis not present

## 2021-11-02 DIAGNOSIS — N6489 Other specified disorders of breast: Secondary | ICD-10-CM

## 2021-11-03 ENCOUNTER — Ambulatory Visit: Payer: PPO | Admitting: Radiation Oncology

## 2021-11-03 ENCOUNTER — Other Ambulatory Visit: Payer: Self-pay

## 2021-11-03 ENCOUNTER — Ambulatory Visit
Admission: RE | Admit: 2021-11-03 | Discharge: 2021-11-03 | Disposition: A | Discharge status: Home / Self Care | Payer: PPO | Source: Ambulatory Visit | Attending: Radiation Oncology | Admitting: Radiation Oncology

## 2021-11-03 DIAGNOSIS — Z510 Encounter for antineoplastic radiation therapy: Secondary | ICD-10-CM | POA: Diagnosis not present

## 2021-11-04 NOTE — Progress Notes (Signed)
[facility] CONSULT NOTE  Patient Care Team: Jinny Sanders, MD as PCP - General (Family Medicine) Richard Miu, DMD as Consulting Physician (Dentistry) Rockwell Germany, RN as Oncology Nurse Navigator Mauro Kaufmann, RN as Oncology Nurse Navigator Nicholas Lose, MD as Consulting Physician (Hematology and Oncology)  CHIEF COMPLAINTS/PURPOSE OF CONSULTATION:  History of breast cancer, establishing care  HISTORY OF PRESENTING ILLNESS:  April Ross 71 y.o. female is here because of recent diagnosis of invasive lobular carcinoma of the right breast. Screening mammogram on 04/28/2021 showed a possible asymmetry in the right breast. Diagnostic mammogram and [place] on 05/22/2021 showed asymmetry at the 12 o'clock position in the right breast. Right lumpectomy on 07/24/2021 showed invasive lobular carcinoma and 2 right axillary lymph nodes positive for metastatic carcinoma. She is currently undergoing radiation therapy which was initiated on 09/28/2021. She presents to the clinic today for initial evaluation and discussion of treatment options.  She has radiation dermatitis going on.  I reviewed her records extensively and collaborated the history with the patient.  SUMMARY OF ONCOLOGIC HISTORY: Oncology History  Carcinoma of upper-inner quadrant of right breast in female, estrogen receptor positive ([place])  06/01/2021 Initial Diagnosis   Screening mammogram detected right breast cancer, biopsy invasive lobular cancer with LCIS grade 2, ER 90%, PR 10%, Ki-67 10%, HER2 negative ratio 1.15   07/24/2021 Surgery   Right lumpectomy: Grade 1 invasive lobular cancer 2.1 cm, margins negative, 2/11 lymph nodes positive, ER 90%, PR 10%, HER2 equivocal by IHC negative by FISH ratio 1.15, Ki-67 10% (Dr. Marlou Starks: Surgeon)   09/20/2021 Oncotype testing   Oncotype DX recurrence score: 14: Distant recurrence at 9 years: 14%   09/28/2021 - 11/14/2021 Radiation Therapy   Adjuvant radiation      MEDICAL HISTORY:  Past Medical History:  Diagnosis Date   Allergy    Arthritis    CRD (chronic renal disease)    stage 3   Depression    Diabetes mellitus without complication (HCC)    GERD (gastroesophageal reflux disease)    Hyperlipidemia    Hypertension    LBBB (left bundle branch block)    a. diagnosed in 10/2017 - echo showed a preserved EF of 55-60% and NST was low-risk showing no evidence of ischemia.    Osteoporosis    polyps in colon    Thyroid disease    Urinary tract infection     SURGICAL HISTORY: Past Surgical History:  Procedure Laterality Date   ABDOMINAL HYSTERECTOMY     APPENDECTOMY     BREAST BIOPSY Right    BREAST LUMPECTOMY Right 07/2021   BREAST LUMPECTOMY WITH RADIOACTIVE SEED AND SENTINEL LYMPH NODE BIOPSY Right 07/24/2021   Procedure: RIGHT BREAST LUMPECTOMY WITH RADIOACTIVE SEED AND SENTINEL LYMPH NODE BIOPSY;  Surgeon: Jovita Kussmaul, MD;  Location: [facility];  Service: General;  Laterality: Right;    SOCIAL HISTORY: Social History   Socioeconomic History   Marital status: Widowed    Spouse name: Not on file   Number of children: Not on file   Years of education: Not on file   Highest education level: Not on file  Occupational History   Not on file  Tobacco Use   Smoking status: Every Day    Packs/day: 0.50    Types: Cigarettes   Smokeless tobacco: Never  Vaping Use   Vaping Use: Never used  Substance and Sexual Activity   Alcohol use: Yes    Alcohol/week: 0.0 standard  drinks    Comment: occ glass of wine or mix drink   Drug use: No   Sexual activity: Not Currently  Other Topics Concern   Not on file  Social History Narrative   Moved from [place] 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    Social Determinants of Health   Financial Resource Strain: Low Risk    Difficulty of Paying Living Expenses: Not very hard  Food Insecurity: No Food Insecurity   Worried  About Charity fundraiser in the Last Year: Never true   Ran Out of Food in the Last Year: Never true  Transportation Needs: No Transportation Needs   Lack of Transportation (Medical): No   Lack of Transportation (Non-Medical): No  Physical Activity: Not on file  Stress: Not on file  Social Connections: Not on file  Intimate Partner Violence: Not on file    FAMILY HISTORY: Family History  Problem Relation Age of Onset   Alzheimer's disease Mother    Hyperlipidemia Mother    Hypertension Mother    Asthma Maternal Grandmother    Diabetes Maternal Grandfather    Alcohol abuse Maternal Grandfather    Hyperlipidemia Maternal Grandfather    Stroke Maternal Grandfather    Hypertension Maternal Grandfather    Diabetes Paternal Grandmother    Alcohol abuse Maternal Uncle    Cholecystitis Maternal Uncle    Arthritis Maternal Uncle     ALLERGIES:  is allergic to acetaminophen, asa [aspirin], ibuprofen, and other.  MEDICATIONS:  Current Outpatient Medications  Medication Sig Dispense Refill   amLODipine (NORVASC) 5 MG tablet Take 1 tablet (5 mg total) by mouth daily. 90 tablet 11   amoxicillin-clavulanate (AUGMENTIN) 875-125 MG tablet Take 1 tablet by mouth 2 (two) times daily. 20 tablet 0   aspirin EC 81 MG tablet Take 81 mg by mouth daily. Swallow whole. (Patient not taking: Reported on 08/17/2021)     cetirizine (ZYRTEC) 10 MG tablet Take 10 mg by mouth at bedtime as needed for allergies.     CINNAMON PO Take 1 tablet by mouth daily.     fluticasone (FLONASE) 50 MCG/ACT nasal spray Place 2 sprays into both nostrils daily as needed for allergies.     gabapentin (NEURONTIN) 100 MG capsule Take 100 mg by mouth 3 (three) times daily as needed.     glyBURIDE (DIABETA) 5 MG tablet Take 2 tablets (10 mg total) by mouth 2 (two) times daily with a meal. 120 tablet 1   levothyroxine (SYNTHROID) 88 MCG tablet TAKE 1 TABLET BY MOUTH EVERY DAY 90 tablet 3   losartan (COZAAR) 100 MG tablet Take 1  tablet (100 mg total) by mouth daily. 90 tablet 1   Omega-3 Fatty Acids (FISH OIL OMEGA-3 PO) Take 1 capsule by mouth daily.     omeprazole (PRILOSEC) 20 MG capsule TAKE 1 CAPSULE BY MOUTH EVERY DAY 90 capsule 3   rosuvastatin (CRESTOR) 20 MG tablet TAKE 1 TABLET BY MOUTH EVERY DAY 90 tablet 3   tiZANidine (ZANAFLEX) 4 MG tablet TAKE 1 TABLET BY MOUTH AT BEDTIME AS NEEDED FOR MUSCLE SPASMS. 90 tablet 0   traMADol (ULTRAM) 50 MG tablet Take 1 tablet (50 mg total) by mouth every 6 (six) hours as needed. 15 tablet 0   Turmeric (QC TUMERIC COMPLEX PO) Take 1 tablet by mouth daily.     VITAMIN D PO Take 2 each by mouth daily.     No current facility-administered medications for this  visit.    REVIEW OF SYSTEMS:   Constitutional: Denies fevers, chills or abnormal night sweats Eyes: Denies blurriness of vision, double vision or watery eyes Ears, nose, mouth, throat, and face: Denies mucositis or sore throat Respiratory: Denies cough, dyspnea or wheezes Cardiovascular: Denies palpitation, chest discomfort or lower extremity swelling Gastrointestinal:  Denies nausea, heartburn or change in bowel habits Skin: Denies abnormal skin rashes Lymphatics: Denies new lymphadenopathy or easy bruising Neurological:Denies numbness, tingling or new weaknesses Behavioral/Psych: Mood is stable, no new changes  Breast: Radiation dermatitis All other systems were reviewed with the patient and are negative.  PHYSICAL EXAMINATION: ECOG PERFORMANCE STATUS: 1 - Symptomatic but completely ambulatory  Vitals:   11/06/21 0914  BP: 131/67  Pulse: 83  Resp: 16  Temp: 97.7 F (36.5 C)  SpO2: 100%   Filed Weights   11/06/21 0914  Weight: 150 lb 4 oz (68.2 kg)       LABORATORY DATA:  I have reviewed the data as listed Lab Results  Component Value Date   WBC 8.5 12/01/2020   HGB 12.8 12/01/2020   HCT 38.2 12/01/2020   MCV 83.6 12/01/2020   PLT 240 12/01/2020   Lab Results  Component Value Date   NA  139 07/21/2021   K 4.3 07/21/2021   CL 107 07/21/2021   CO2 24 07/21/2021    RADIOGRAPHIC STUDIES: I have personally reviewed the radiological reports and agreed with the findings in the report.  ASSESSMENT AND PLAN:  Carcinoma of upper-inner quadrant of right breast in female, estrogen receptor positive ([place]) 06/01/2021: Screening mammogram detected right breast cancer invasive lobular cancer with LCIS grade 2 ER/PR positive HER2 negative with a Ki-67 10% 07/24/2021: Right lumpectomy: Grade 1 invasive lobular cancer 2.1 cm, margins negative, 2/11 lymph nodes positive, ER 90%, PR 10%, HER2 equivocal by IHC negative by FISH ratio 1.15, Ki-67 10% (Dr. Marlou Starks: Surgeon) 09/08/2021-11/14/2021: Adjuvant radiation  Pathology and radiology counseling: Discussed with the patient, the details of pathology including the type of breast cancer,the clinical staging, the significance of ER, PR and HER-2/neu receptors and the implications for treatment. After reviewing the pathology in detail, we proceeded to discuss her current treatment plan so far and I agree with the plan.  Treatment plan: Adjuvant antiestrogen therapy with letrozole 2.5 mg daily x7 years to start 11/28/2021 There is no indication for Verzenio because the tumor is grade 1 and less than 4 lymph nodes were involved.  Letrozole counseling: We discussed the risks and benefits of anti-estrogen therapy with aromatase inhibitors. These include but not limited to insomnia, hot flashes, mood changes, vaginal dryness, bone density loss, and weight gain. We strongly believe that the benefits far outweigh the risks. Patient understands these risks and consented to starting treatment. Planned treatment duration is 7 years.  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 She takes care of her 37 year old uncle on a daily basis.  Return to clinic in 3 months for survivorship care plan visit    All questions were answered. The patient knows to  call the clinic with any problems, questions or concerns.   Rulon Eisenmenger, MD, MPH 11/06/2021    I, Thana Ates, am acting as scribe for Nicholas Lose, MD.  I have reviewed the above documentation for accuracy and completeness, and I agree with the above.

## 2021-11-06 ENCOUNTER — Ambulatory Visit
Admission: RE | Admit: 2021-11-06 | Discharge: 2021-11-06 | Disposition: A | Discharge status: Home / Self Care | Payer: PPO | Source: Ambulatory Visit | Attending: Radiation Oncology | Admitting: Radiation Oncology

## 2021-11-06 ENCOUNTER — Inpatient Hospital Stay (HOSPITAL_BASED_OUTPATIENT_CLINIC_OR_DEPARTMENT_OTHER): Payer: PPO | Admitting: Hematology and Oncology

## 2021-11-06 ENCOUNTER — Other Ambulatory Visit: Payer: Self-pay

## 2021-11-06 DIAGNOSIS — L598 Other specified disorders of the skin and subcutaneous tissue related to radiation: Secondary | ICD-10-CM | POA: Insufficient documentation

## 2021-11-06 DIAGNOSIS — E785 Hyperlipidemia, unspecified: Secondary | ICD-10-CM | POA: Insufficient documentation

## 2021-11-06 DIAGNOSIS — Z8719 Personal history of other diseases of the digestive system: Secondary | ICD-10-CM | POA: Insufficient documentation

## 2021-11-06 DIAGNOSIS — F17210 Nicotine dependence, cigarettes, uncomplicated: Secondary | ICD-10-CM | POA: Insufficient documentation

## 2021-11-06 DIAGNOSIS — E1122 Type 2 diabetes mellitus with diabetic chronic kidney disease: Secondary | ICD-10-CM | POA: Insufficient documentation

## 2021-11-06 DIAGNOSIS — K219 Gastro-esophageal reflux disease without esophagitis: Secondary | ICD-10-CM | POA: Insufficient documentation

## 2021-11-06 DIAGNOSIS — Z7984 Long term (current) use of oral hypoglycemic drugs: Secondary | ICD-10-CM | POA: Insufficient documentation

## 2021-11-06 DIAGNOSIS — Z170 Estrogen receptor positive status [ER+]: Secondary | ICD-10-CM | POA: Insufficient documentation

## 2021-11-06 DIAGNOSIS — Z510 Encounter for antineoplastic radiation therapy: Secondary | ICD-10-CM | POA: Diagnosis not present

## 2021-11-06 DIAGNOSIS — Z87440 Personal history of urinary (tract) infections: Secondary | ICD-10-CM | POA: Insufficient documentation

## 2021-11-06 DIAGNOSIS — Z7982 Long term (current) use of aspirin: Secondary | ICD-10-CM | POA: Insufficient documentation

## 2021-11-06 DIAGNOSIS — M810 Age-related osteoporosis without current pathological fracture: Secondary | ICD-10-CM | POA: Insufficient documentation

## 2021-11-06 DIAGNOSIS — C773 Secondary and unspecified malignant neoplasm of axilla and upper limb lymph nodes: Secondary | ICD-10-CM | POA: Insufficient documentation

## 2021-11-06 DIAGNOSIS — N189 Chronic kidney disease, unspecified: Secondary | ICD-10-CM | POA: Insufficient documentation

## 2021-11-06 DIAGNOSIS — C50211 Malignant neoplasm of upper-inner quadrant of right female breast: Secondary | ICD-10-CM | POA: Insufficient documentation

## 2021-11-06 DIAGNOSIS — Z79899 Other long term (current) drug therapy: Secondary | ICD-10-CM | POA: Insufficient documentation

## 2021-11-06 DIAGNOSIS — I129 Hypertensive chronic kidney disease with stage 1 through stage 4 chronic kidney disease, or unspecified chronic kidney disease: Secondary | ICD-10-CM | POA: Insufficient documentation

## 2021-11-06 LAB — AEROBIC/ANAEROBIC CULTURE W GRAM STAIN (SURGICAL/DEEP WOUND): Culture: NO GROWTH

## 2021-11-06 MED ORDER — LETROZOLE 2.5 MG PO TABS: 2.5000 mg | ORAL_TABLET | Freq: Every day | ORAL | 3 refills | Status: DC

## 2021-11-06 NOTE — Assessment & Plan Note (Signed)
06/01/2021: Screening mammogram detected right breast cancer invasive lobular cancer with LCIS grade 2 ER/PR positive HER2 negative with a Ki-67 10% 07/24/2021: Right lumpectomy: Grade 1 invasive lobular cancer 2.1 cm, margins negative, 2/11 lymph nodes positive, ER 90%, PR 10%, HER2 equivocal by IHC negative by FISH ratio 1.15, Ki-67 10% (Dr. Marlou Starks: Surgeon) 09/08/2021-11/14/2021: Adjuvant radiation  Pathology and radiology counseling: Discussed with the patient, the details of pathology including the type of breast cancer,the clinical staging, the significance of ER, PR and HER-2/neu receptors and the implications for treatment. After reviewing the pathology in detail, we proceeded to discuss her current treatment plan so far and I agree with the plan.  Treatment plan: Adjuvant antiestrogen therapy with letrozole 2.5 mg daily x7 years There is no indication for Verzenio because the tumor is grade 1 and less than 4 lymph nodes were involved.  Letrozole counseling: We discussed the risks and benefits of anti-estrogen therapy with aromatase inhibitors. These include but not limited to insomnia, hot flashes, mood changes, vaginal dryness, bone density loss, and weight gain. We strongly believe that the benefits far outweigh the risks. Patient understands these risks and consented to starting treatment. Planned treatment duration is 7 years.  Return to clinic in 3 months for survivorship care plan visit

## 2021-11-07 ENCOUNTER — Ambulatory Visit
Admission: RE | Admit: 2021-11-07 | Discharge: 2021-11-07 | Disposition: A | Discharge status: Home / Self Care | Payer: PPO | Source: Ambulatory Visit | Attending: Radiation Oncology | Admitting: Radiation Oncology

## 2021-11-07 DIAGNOSIS — Z510 Encounter for antineoplastic radiation therapy: Secondary | ICD-10-CM | POA: Diagnosis not present

## 2021-11-07 DIAGNOSIS — N6489 Other specified disorders of breast: Secondary | ICD-10-CM

## 2021-11-08 ENCOUNTER — Ambulatory Visit
Admission: RE | Admit: 2021-11-08 | Discharge: 2021-11-08 | Disposition: A | Discharge status: Home / Self Care | Payer: PPO | Source: Ambulatory Visit | Attending: Radiation Oncology | Admitting: Radiation Oncology

## 2021-11-08 ENCOUNTER — Other Ambulatory Visit: Payer: Self-pay

## 2021-11-08 DIAGNOSIS — E785 Hyperlipidemia, unspecified: Secondary | ICD-10-CM | POA: Insufficient documentation

## 2021-11-08 DIAGNOSIS — L598 Other specified disorders of the skin and subcutaneous tissue related to radiation: Secondary | ICD-10-CM | POA: Diagnosis not present

## 2021-11-08 DIAGNOSIS — K219 Gastro-esophageal reflux disease without esophagitis: Secondary | ICD-10-CM | POA: Insufficient documentation

## 2021-11-08 DIAGNOSIS — Z79899 Other long term (current) drug therapy: Secondary | ICD-10-CM | POA: Insufficient documentation

## 2021-11-08 DIAGNOSIS — Z87440 Personal history of urinary (tract) infections: Secondary | ICD-10-CM | POA: Diagnosis not present

## 2021-11-08 DIAGNOSIS — Z7982 Long term (current) use of aspirin: Secondary | ICD-10-CM | POA: Diagnosis not present

## 2021-11-08 DIAGNOSIS — Z7984 Long term (current) use of oral hypoglycemic drugs: Secondary | ICD-10-CM | POA: Diagnosis not present

## 2021-11-08 DIAGNOSIS — Z170 Estrogen receptor positive status [ER+]: Secondary | ICD-10-CM | POA: Insufficient documentation

## 2021-11-08 DIAGNOSIS — N189 Chronic kidney disease, unspecified: Secondary | ICD-10-CM | POA: Insufficient documentation

## 2021-11-08 DIAGNOSIS — F17210 Nicotine dependence, cigarettes, uncomplicated: Secondary | ICD-10-CM | POA: Diagnosis not present

## 2021-11-08 DIAGNOSIS — M810 Age-related osteoporosis without current pathological fracture: Secondary | ICD-10-CM | POA: Insufficient documentation

## 2021-11-08 DIAGNOSIS — Z8719 Personal history of other diseases of the digestive system: Secondary | ICD-10-CM | POA: Diagnosis not present

## 2021-11-08 DIAGNOSIS — I129 Hypertensive chronic kidney disease with stage 1 through stage 4 chronic kidney disease, or unspecified chronic kidney disease: Secondary | ICD-10-CM | POA: Diagnosis not present

## 2021-11-08 DIAGNOSIS — C773 Secondary and unspecified malignant neoplasm of axilla and upper limb lymph nodes: Secondary | ICD-10-CM | POA: Diagnosis not present

## 2021-11-08 DIAGNOSIS — Z510 Encounter for antineoplastic radiation therapy: Secondary | ICD-10-CM | POA: Insufficient documentation

## 2021-11-08 DIAGNOSIS — C50411 Malignant neoplasm of upper-outer quadrant of right female breast: Secondary | ICD-10-CM | POA: Diagnosis not present

## 2021-11-08 DIAGNOSIS — E1122 Type 2 diabetes mellitus with diabetic chronic kidney disease: Secondary | ICD-10-CM | POA: Diagnosis not present

## 2021-11-09 ENCOUNTER — Ambulatory Visit
Admission: RE | Admit: 2021-11-09 | Discharge: 2021-11-09 | Disposition: A | Discharge status: Home / Self Care | Payer: PPO | Source: Ambulatory Visit | Attending: Radiation Oncology | Admitting: Radiation Oncology

## 2021-11-09 DIAGNOSIS — Z510 Encounter for antineoplastic radiation therapy: Secondary | ICD-10-CM | POA: Diagnosis not present

## 2021-11-10 ENCOUNTER — Ambulatory Visit
Admission: RE | Admit: 2021-11-10 | Discharge: 2021-11-10 | Disposition: A | Discharge status: Home / Self Care | Payer: PPO | Source: Ambulatory Visit | Attending: Radiation Oncology | Admitting: Radiation Oncology

## 2021-11-10 ENCOUNTER — Other Ambulatory Visit: Payer: Self-pay

## 2021-11-10 DIAGNOSIS — Z510 Encounter for antineoplastic radiation therapy: Secondary | ICD-10-CM | POA: Diagnosis not present

## 2021-11-13 ENCOUNTER — Other Ambulatory Visit: Payer: Self-pay

## 2021-11-13 ENCOUNTER — Ambulatory Visit
Admission: RE | Admit: 2021-11-13 | Discharge: 2021-11-13 | Disposition: A | Discharge status: Home / Self Care | Payer: PPO | Source: Ambulatory Visit | Attending: Radiation Oncology | Admitting: Radiation Oncology

## 2021-11-13 ENCOUNTER — Ambulatory Visit: Payer: PPO | Admitting: Internal Medicine

## 2021-11-13 DIAGNOSIS — Z510 Encounter for antineoplastic radiation therapy: Secondary | ICD-10-CM | POA: Diagnosis not present

## 2021-11-13 NOTE — Progress Notes (Signed)
° °                                                                                                                                                          °  Patient Name: April Ross MRN: [id number] DOB: [birth year] Referring Physician: Jovita Kussmaul (Profile Not Attached) Date of Service: 11/14/2021 [facility], [place]                                                        End Of Treatment Note  Diagnoses: C50.411-Malignant neoplasm of upper-outer quadrant of right female breast  Cancer Staging: Stage IB, pT2N1M0, grade1,  ER/PR positive, invasive lobular carcinoma of the right breast.   Intent: Curative  Radiation Treatment Dates: 09/27/2021 through 11/14/2021 Site Technique Total Dose (Gy) Dose per Fx (Gy) Completed Fx Beam Energies  Breast, Right: Breast_R 3D 50.4/50.4 1.8 28/28 10XFFF  Breast, Right: Breast_R_SCLV 3D 50.4/50.4 1.8 28/28 6X, 10X  Breast, Right: Breast_R_Bst 3D 10/10 2 5/5 6X, 10X   Narrative: The patient tolerated radiation therapy relatively well. She developed fatigue and anticipated skin changes in the treatment field. She did have a delayed seroma that became infected and was treated with antibiotics and aspiration.   Plan: The patient will receive a call in about one month from the radiation oncology department. She will continue follow up with Dr. Lindi Adie as well.   ________________________________________________    Carola Rhine, [facility]

## 2021-11-14 ENCOUNTER — Encounter: Payer: Self-pay | Admitting: Radiation Oncology

## 2021-11-14 ENCOUNTER — Ambulatory Visit
Admission: RE | Admit: 2021-11-14 | Discharge: 2021-11-14 | Disposition: A | Discharge status: Home / Self Care | Payer: PPO | Source: Ambulatory Visit | Attending: Radiation Oncology | Admitting: Radiation Oncology

## 2021-11-14 ENCOUNTER — Encounter: Payer: Self-pay | Admitting: *Deleted

## 2021-11-14 DIAGNOSIS — Z170 Estrogen receptor positive status [ER+]: Secondary | ICD-10-CM

## 2021-11-14 DIAGNOSIS — Z510 Encounter for antineoplastic radiation therapy: Secondary | ICD-10-CM | POA: Diagnosis not present

## 2021-11-15 ENCOUNTER — Other Ambulatory Visit: Payer: Self-pay | Admitting: Family Medicine

## 2021-11-15 ENCOUNTER — Other Ambulatory Visit: Payer: PPO

## 2021-11-15 ENCOUNTER — Ambulatory Visit: Payer: PPO | Admitting: Internal Medicine

## 2021-11-15 NOTE — Progress Notes (Signed)
°  Radiation Oncology         [phone] ________________________________  Name: Cris Gibby Law MRN: [id number]  Date: 10/25/2021  DOB: [birth year]  SIMULATION NOTE   NARRATIVE:  The patient underwent simulation today for ongoing radiation therapy.  The existing CT study set was employed for the purpose of virtual treatment planning.  The target and avoidance structures were reviewed and modified as necessary.  Treatment planning then occurred.  The radiation boost prescription was entered and confirmed.  A total of 3 complex treatment devices were fabricated in the form of multi-leaf collimators to shape radiation around the targets while maximally excluding nearby normal structures. I have requested : Isodose Plan.    PLAN:  This modified radiation beam arrangement is intended to continue the current radiation dose to an additional 10 Gy in 5 fractions for a total cumulative dose of 60.4 Gy.    ------------------------------------------------  Jodelle Gross, MD, PhD

## 2021-11-16 ENCOUNTER — Other Ambulatory Visit: Payer: Self-pay

## 2021-11-16 ENCOUNTER — Encounter: Payer: Self-pay | Admitting: Rehabilitation

## 2021-11-16 ENCOUNTER — Ambulatory Visit: Payer: PPO | Attending: General Surgery | Admitting: Rehabilitation

## 2021-11-16 DIAGNOSIS — R293 Abnormal posture: Secondary | ICD-10-CM | POA: Insufficient documentation

## 2021-11-16 DIAGNOSIS — C50211 Malignant neoplasm of upper-inner quadrant of right female breast: Secondary | ICD-10-CM | POA: Insufficient documentation

## 2021-11-16 DIAGNOSIS — Z483 Aftercare following surgery for neoplasm: Secondary | ICD-10-CM | POA: Insufficient documentation

## 2021-11-16 DIAGNOSIS — Z170 Estrogen receptor positive status [ER+]: Secondary | ICD-10-CM | POA: Insufficient documentation

## 2021-11-16 NOTE — Therapy (Signed)
[facility] @ [facility] [place], [place], [address] Phone: [phone]   Fax:  [phone]  Physical Therapy Treatment  Patient Details  Name: April Ross MRN: [id number] Date of Birth: 24-Oct-1950 Referring Provider (PT): Dr. Marlou Starks   Encounter Date: 11/16/2021   PT End of Session - 11/16/21 0800     Visit Number 7   screen only   PT Start Time 0800    PT Stop Time 0819    PT Time Calculation (min) 19 min    Activity Tolerance Patient tolerated treatment well    Behavior During Therapy [facility] for tasks assessed/performed             Past Medical History:  Diagnosis Date   Allergy    Arthritis    CRD (chronic renal disease)    stage 3   Depression    Diabetes mellitus without complication (HCC)    GERD (gastroesophageal reflux disease)    Hyperlipidemia    Hypertension    LBBB (left bundle branch block)    a. diagnosed in 10/2017 - echo showed a preserved EF of 55-60% and NST was low-risk showing no evidence of ischemia.    Osteoporosis    polyps in colon    Thyroid disease    Urinary tract infection     Past Surgical History:  Procedure Laterality Date   ABDOMINAL HYSTERECTOMY     APPENDECTOMY     BREAST BIOPSY Right    BREAST LUMPECTOMY Right 07/2021   BREAST LUMPECTOMY WITH RADIOACTIVE SEED AND SENTINEL LYMPH NODE BIOPSY Right 07/24/2021   Procedure: RIGHT BREAST LUMPECTOMY WITH RADIOACTIVE SEED AND SENTINEL LYMPH NODE BIOPSY;  Surgeon: Jovita Kussmaul, MD;  Location: [facility];  Service: General;  Laterality: Right;    There were no vitals filed for this visit.   Subjective Assessment - 11/16/21 0801     Subjective They drained it some    Currently in Pain? No/denies                    L-DEX FLOWSHEETS - 11/16/21 0900       L-DEX LYMPHEDEMA SCREENING   Measurement Type Unilateral    L-DEX MEASUREMENT EXTREMITY Upper Extremity    POSITION  Standing    DOMINANT  SIDE Right    At Risk Side Right    BASELINE SCORE (UNILATERAL) -5.9    L-DEX SCORE (UNILATERAL) 14.5    VALUE CHANGE (UNILAT) 20.4    Comment pt was issued size 2 medi harmony sleeve with instruction on use due to continued increase in SOZO score.  Did not have matching size gauntlet so gave pt instruction to use the sleeve only for now and order the gauntlet if the back of the hand starts getting puffy.  Pt will wear this x 4 weeks with remeasure.  Pt was referred back for PT sessions and has an eval soon so we will add MLD and sozo to this                                     PT Long Term Goals - 09/21/21 0901       PT LONG TERM GOAL #1   Title Pt will return to baseline AROM    Baseline Flex 155 and abd 169 - 09/14/21; flex 159 - 09/21/21    Status Achieved  PT LONG TERM GOAL #2   Title Pt will decrease resting axillary pain to 0/10    Baseline 2-3/10; 0/10 at rest now - 09/14/21    Status Achieved                   Plan - 11/16/21 0948     Clinical Impression Statement SOZO increased another 10 points over 3 weeks of waiting for infection to clear.  Pt reports it is clear but still a bit swollen.  pt was issued size 2 medi harmony sleeve with instruction on use due to continued increase in SOZO score.  Did not have matching size gauntlet so gave pt instruction to use the sleeve only for now and order the gauntlet if the back of the hand starts getting puffy.  Pt will wear this x 4 weeks with remeasure.  Pt was referred back for PT sessions and has an eval soon so we will add MLD and sozo to this    PT Next Visit Plan When pt returns for eval add repeat SOZO around 12/14/21 for garment effectiveness check* and continue every 3 month surveillance    Consulted and Agree with Plan of Care Patient             Patient will benefit from skilled therapeutic intervention in order to improve the following deficits and impairments:     Visit  Diagnosis: No diagnosis found.     Problem List Patient Active Problem List   Diagnosis Date Noted   Chronic tension-type headache, not intractable 07/25/2021   Carcinoma of upper-inner quadrant of right breast in female, estrogen receptor positive ([place]) 06/15/2021   Diabetic retinopathy ([place]) 11/24/2020   ETD (Eustachian tube dysfunction), right 08/04/2019   Chronic bilateral thoracic back pain 11/21/2018   Type 2 diabetes mellitus with nephropathy ([place]) 10/27/2017   Tobacco abuse 10/27/2017   Nonspecific chest pain    Dizziness 05/16/2017   Osteoporosis 05/24/2016   CKD (chronic kidney disease) stage 3, GFR 30-59 ml/min (HCC) 05/08/2016   Hyperlipidemia associated with type 2 diabetes mellitus ([place]) 01/12/2016   Hypothyroidism 01/12/2016   Hypertension associated with diabetes ([place]) 01/12/2016   Allergic rhinitis 01/12/2016   Moderate major depression, single episode ([place]) 01/12/2016   Chronic insomnia 01/12/2016   GERD (gastroesophageal reflux disease) 01/12/2016    Stark Bray, PT 11/16/2021, 9:50 AM  [facility] @ [facility], [place], [address] Phone: [phone]   Fax:  [phone]  Name: Rocio Roam [place]se[place] MRN: [id number] Date of Birth: 05-27-1951

## 2021-11-20 ENCOUNTER — Other Ambulatory Visit: Payer: Self-pay

## 2021-11-20 ENCOUNTER — Encounter: Payer: Self-pay | Admitting: Rehabilitation

## 2021-11-20 ENCOUNTER — Ambulatory Visit: Payer: PPO | Admitting: Rehabilitation

## 2021-11-20 DIAGNOSIS — R293 Abnormal posture: Secondary | ICD-10-CM

## 2021-11-20 DIAGNOSIS — Z483 Aftercare following surgery for neoplasm: Secondary | ICD-10-CM

## 2021-11-20 DIAGNOSIS — C50211 Malignant neoplasm of upper-inner quadrant of right female breast: Secondary | ICD-10-CM

## 2021-11-20 NOTE — Therapy (Signed)
[facility] @ [place], [place], [address] Phone: [phone]   Fax:  [phone]  Physical Therapy Re-evaluation  Patient Details  Name: April Ross MRN: [id number] Date of Birth: May 24, 1951 Referring Provider (PT): Dr. Marlou Starks   Encounter Date: 11/20/2021   PT End of Session - 11/20/21 0759     Visit Number 8    Number of Visits 16    Date for PT Re-Evaluation 01/01/22    PT Start Time 0800    PT Stop Time 0823    PT Time Calculation (min) 23 min    Activity Tolerance Patient tolerated treatment well    Behavior During Therapy [facility] for tasks assessed/performed             Past Medical History:  Diagnosis Date   Allergy    Arthritis    CRD (chronic renal disease)    stage 3   Depression    Diabetes mellitus without complication (HCC)    GERD (gastroesophageal reflux disease)    Hyperlipidemia    Hypertension    LBBB (left bundle branch block)    a. diagnosed in 10/2017 - echo showed a preserved EF of 55-60% and NST was low-risk showing no evidence of ischemia.    Osteoporosis    polyps in colon    Thyroid disease    Urinary tract infection     Past Surgical History:  Procedure Laterality Date   ABDOMINAL HYSTERECTOMY     APPENDECTOMY     BREAST BIOPSY Right    BREAST LUMPECTOMY Right 07/2021   BREAST LUMPECTOMY WITH RADIOACTIVE SEED AND SENTINEL LYMPH NODE BIOPSY Right 07/24/2021   Procedure: RIGHT BREAST LUMPECTOMY WITH RADIOACTIVE SEED AND SENTINEL LYMPH NODE BIOPSY;  Surgeon: Jovita Kussmaul, MD;  Location: [facility];  Service: General;  Laterality: Right;    There were no vitals filed for this visit.   Subjective Assessment - 11/20/21 0759     Subjective The sleeve is okay.  I am really wearing it but it is annoying.  I am back because I am having.  I still feel like I am having the cording with reaching any direction.  I am still healing from radiation.  I still can't  wear the compression bra.    Pertinent History Pt will be having a Rt lumpectomy with 2/11 lymph nodes positive on 07/24/21. Done with radiation. No chemotherapy and now on antiestrogen.    other history includes: DM, stage 3 CKD, osteoporosis    Currently in Pain? No/denies   only when I reach from the elbow up up to 6/10               [facility] PT Assessment - 11/20/21 0001       Assessment   Medical Diagnosis Rt breast cancer    Referring Provider (PT) Dr. Marlou Starks    Onset Date/Surgical Date 07/24/21    Hand Dominance Right    Prior Therapy no      Precautions   Precaution Comments lymphedema Rt      Restrictions   Weight Bearing Restrictions No      Balance Screen   Has the patient fallen in the past 6 months No    Has the patient had a decrease in activity level because of a fear of falling?  No    Is the patient reluctant to leave their home because of a fear of falling?  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    Leisure walking      Cognition   Overall Cognitive Status Within Functional Limits for tasks assessed      Observation/Other Assessments   Observations Rt radiation site very dark and red especialyl in the axilla still feeling like sunburn and tender to touch - no blisters      Posture/Postural Control   Posture/Postural Control Postural limitations    Postural Limitations Rounded Shoulders;Forward head      AROM   Right Shoulder Flexion 160 Degrees   tightness   Right Shoulder ABduction 165 Degrees   tightness   Right Shoulder External Rotation 80 Degrees   tightness     Palpation   Palpation comment large cord in axilla and just able to feel them in the arm                                          PT Long Term Goals - 11/20/21 0826       PT LONG TERM GOAL #1   Title Pt will return to baseline AROM    Status Achieved      PT LONG TERM GOAL #2   Title Pt will decrease resting axillary pain to 0/10    Status Achieved      PT LONG TERM GOAL #3   Title Pt will decrease tightness in Rt UE with reach by at least 50%    Baseline 6/10 tightness/pain with reach    Time 0.01    Status New                   Plan - 11/20/21 2426     Clinical Impression Statement Pt returns to start formal PT again after radiation for Rt cording and now new onset of subclinical into clinical Rt UE lmyphedema from SOZO score of almost 20 last week.  Pt is less than 1 week from end of radiation and is still very red and raw in the axilla.  We discussed skin care, healing, and AROM for now and pt will return at least 2 weeks-3weeks post radiation to start treatment and MT.    Personal Factors and Comorbidities Age;Comorbidity 3+    Comorbidities DM, osteoporosis, CKD    Stability/Clinical Decision Making Stable/Uncomplicated    Clinical Decision Making Low    Rehab Potential Excellent    PT Frequency 2x / week    PT Duration 4 weeks    PT Treatment/Interventions ADLs/Self Care Home Management;Patient/family education;Therapeutic exercise;Manual techniques    PT Next Visit Plan Rt UE/axilla into forearm cording release and PROM, include some MLD of the Rt arm as pt is using sleeve for increased SOZO and education on this.  repeat SOZO around 12/14/21 for garment effectiveness check* and continue every 3 month surveillance    PT Home Exercise Plan Access Code: [email or web address] and post op supine    Consulted and Agree with Plan of Care Patient             Patient will benefit from skilled therapeutic intervention in order to improve the following deficits  and impairments:  Postural dysfunction, Decreased knowledge of precautions, Increased edema,  Increased fascial restricitons, Pain  Visit Diagnosis: Aftercare following surgery for neoplasm  Carcinoma of upper-inner quadrant of right breast in female, estrogen receptor positive ([place])  Abnormal posture     Problem List Patient Active Problem List   Diagnosis Date Noted   Chronic tension-type headache, not intractable 07/25/2021   Carcinoma of upper-inner quadrant of right breast in female, estrogen receptor positive ([place]) 06/15/2021   Diabetic retinopathy ([place]) 11/24/2020   ETD (Eustachian tube dysfunction), right 08/04/2019   Chronic bilateral thoracic back pain 11/21/2018   Type 2 diabetes mellitus with nephropathy ([place]) 10/27/2017   Tobacco abuse 10/27/2017   Nonspecific chest pain    Dizziness 05/16/2017   Osteoporosis 05/24/2016   CKD (chronic kidney disease) stage 3, GFR 30-59 ml/min ([place]) 05/08/2016   Hyperlipidemia associated with type 2 diabetes mellitus ([place]) 01/12/2016   Hypothyroidism 01/12/2016   Hypertension associated with diabetes ([place]) 01/12/2016   Allergic rhinitis 01/12/2016   Moderate major depression, single episode ([place]) 01/12/2016   Chronic insomnia 01/12/2016   GERD (gastroesophageal reflux disease) 01/12/2016    Stark Bray, PT 11/20/2021, 8:27 AM  [facility] @ [place], [place], [address] Phone: [phone]   Fax:  [phone]  Name: April Ross MRN: [id number] Date of Birth: 1951-03-23

## 2021-11-24 ENCOUNTER — Other Ambulatory Visit: Payer: Self-pay | Admitting: Family Medicine

## 2021-11-28 DIAGNOSIS — C50411 Malignant neoplasm of upper-outer quadrant of right female breast: Secondary | ICD-10-CM | POA: Diagnosis not present

## 2021-11-28 DIAGNOSIS — Z170 Estrogen receptor positive status [ER+]: Secondary | ICD-10-CM | POA: Diagnosis not present

## 2021-11-29 ENCOUNTER — Telehealth: Payer: Self-pay | Admitting: Family Medicine

## 2021-11-29 DIAGNOSIS — E1121 Type 2 diabetes mellitus with diabetic nephropathy: Secondary | ICD-10-CM

## 2021-11-29 DIAGNOSIS — E039 Hypothyroidism, unspecified: Secondary | ICD-10-CM

## 2021-11-29 NOTE — Telephone Encounter (Signed)
-----   Message from Ellamae Sia sent at 11/27/2021  3:46 PM EST ----- Regarding: Lab orders for Friday, 3.3.23 Patient is scheduled for CPX labs, please order future labs, Thanks , Karna Christmas

## 2021-11-30 ENCOUNTER — Encounter (HOSPITAL_COMMUNITY): Payer: Self-pay

## 2021-11-30 ENCOUNTER — Other Ambulatory Visit: Payer: Self-pay | Admitting: Family Medicine

## 2021-11-30 NOTE — Telephone Encounter (Signed)
Last office visit 06/15/21 for DM, HA & MDD.  Last refilled 08/10/21 for #90 with no refills.  CPE scheduled for 12/15/2021.

## 2021-12-01 MED ORDER — TIZANIDINE HCL 4 MG PO TABS: ORAL_TABLET | ORAL | 0 refills | Status: DC

## 2021-12-05 ENCOUNTER — Other Ambulatory Visit: Payer: Self-pay

## 2021-12-05 ENCOUNTER — Ambulatory Visit: Payer: PPO

## 2021-12-05 ENCOUNTER — Other Ambulatory Visit: Payer: Self-pay | Admitting: Family Medicine

## 2021-12-05 DIAGNOSIS — R293 Abnormal posture: Secondary | ICD-10-CM

## 2021-12-05 DIAGNOSIS — Z170 Estrogen receptor positive status [ER+]: Secondary | ICD-10-CM

## 2021-12-05 DIAGNOSIS — C50211 Malignant neoplasm of upper-inner quadrant of right female breast: Secondary | ICD-10-CM | POA: Diagnosis not present

## 2021-12-05 DIAGNOSIS — Z483 Aftercare following surgery for neoplasm: Secondary | ICD-10-CM

## 2021-12-05 NOTE — Therapy (Signed)
[facility] @ [place], [place], [address] Phone: [phone]   Fax:  [phone]  Physical Therapy Treatment  Patient Details  Name: April Ross Cuff MRN: [id number] Date of Birth: 04/21/1951 Referring Provider (PT): Dr. Marlou Starks   Encounter Date: 12/05/2021   PT End of Session - 12/05/21 0902     Visit Number 9    Number of Visits 16    Date for PT Re-Evaluation 01/01/22    PT Start Time 0803    PT Stop Time 0901    PT Time Calculation (min) 58 min    Activity Tolerance Patient tolerated treatment well    Behavior During Therapy [facility] for tasks assessed/performed             Past Medical History:  Diagnosis Date   Allergy    Arthritis    CRD (chronic renal disease)    stage 3   Depression    Diabetes mellitus without complication (HCC)    GERD (gastroesophageal reflux disease)    Hyperlipidemia    Hypertension    LBBB (left bundle branch block)    a. diagnosed in 10/2017 - echo showed a preserved EF of 55-60% and NST was low-risk showing no evidence of ischemia.    Osteoporosis    polyps in colon    Thyroid disease    Urinary tract infection     Past Surgical History:  Procedure Laterality Date   ABDOMINAL HYSTERECTOMY     APPENDECTOMY     BREAST BIOPSY Right    BREAST LUMPECTOMY Right 07/2021   BREAST LUMPECTOMY WITH RADIOACTIVE SEED AND SENTINEL LYMPH NODE BIOPSY Right 07/24/2021   Procedure: RIGHT BREAST LUMPECTOMY WITH RADIOACTIVE SEED AND SENTINEL LYMPH NODE BIOPSY;  Surgeon: Jovita Kussmaul, MD;  Location: [facility];  Service: General;  Laterality: Right;    There were no vitals filed for this visit.   Subjective Assessment - 12/05/21 0757     Subjective I'm wearing the sleeve but I really hate it because it's uncomfortable and it's itchy in my elbow.    Pertinent History Pt will be having a Rt lumpectomy with 2/11 lymph nodes positive on 07/24/21. Done with radiation. No  chemotherapy and now on antiestrogen.    other history includes: DM, stage 3 CKD, osteoporosis    Patient Stated Goals get information from providers    Currently in Pain? No/denies                               [facility] Adult PT Treatment/Exercise - 12/05/21 0001       Manual Therapy   Manual Therapy Manual Lymphatic Drainage (MLD);Myofascial release;Passive ROM    Myofascial Release To Rt axilla into medial upper arm    Manual Lymphatic Drainage (MLD) In Supine: Short neck, 5 diaphragmatic breaths, Lt axilla and Rt inguinal nodes, anterior inter-axillary and Rt axillo-inguinal anastomosis, then Rt UE working distal to proximal then retracing all steps beginning to instruct pt in this while performing    Passive ROM In Supine to Rt shoulder into flexion, abduction and D2                          PT Long Term Goals - 11/20/21 0826       PT LONG TERM GOAL #1   Title Pt will return to baseline AROM  Status Achieved      PT LONG TERM GOAL #2   Title Pt will decrease resting axillary pain to 0/10    Status Achieved      PT LONG TERM GOAL #3   Title Pt will decrease tightness in Rt UE with reach by at least 50%    Baseline 6/10 tightness/pain with reach    Time 0.01    Status New                   Plan - 12/05/21 6144     Clinical Impression Statement Continued manual therapy working to decrease fascial restrictions causing cording at Rt axilla. Also added MLD to Rt UE and began instructing pt in this while performing. Hre end P/ROM did improve some by end of session. She reports though the compression sleeve is irritating when she wears it she can tolerate it for one more week until her 30 days will be up on 12/14/21. Also suggested she try applying hydrocortisone cream or lotion to antecubital fossa before donning to see if this will decrease itching she has been experiencing here.    Personal Factors and Comorbidities Age;Comorbidity 3+     Comorbidities DM, osteoporosis, CKD    Stability/Clinical Decision Making Stable/Uncomplicated    Rehab Potential Excellent    PT Frequency 2x / week    PT Duration 4 weeks    PT Treatment/Interventions ADLs/Self Care Home Management;Patient/family education;Therapeutic exercise;Manual techniques    PT Next Visit Plan Rt UE/axilla into forearm cording release and PROM, cont and instruct issuing handout MLD of the Rt arm as pt is using sleeve for increased SOZO and education on this.  repeat SOZO around 12/14/21 for garment effectiveness check* and continue every 3 month surveillance    Consulted and Agree with Plan of Care Patient             Patient will benefit from skilled therapeutic intervention in order to improve the following deficits and impairments:  Postural dysfunction, Decreased knowledge of precautions, Increased edema, Increased fascial restricitons, Pain  Visit Diagnosis: Aftercare following surgery for neoplasm  Carcinoma of upper-inner quadrant of right breast in female, estrogen receptor positive ([place])  Abnormal posture     Problem List Patient Active Problem List   Diagnosis Date Noted   Chronic tension-type headache, not intractable 07/25/2021   Carcinoma of upper-inner quadrant of right breast in female, estrogen receptor positive ([place]) 06/15/2021   Diabetic retinopathy ([place]) 11/24/2020   ETD (Eustachian tube dysfunction), right 08/04/2019   Chronic bilateral thoracic back pain 11/21/2018   Type 2 diabetes mellitus with nephropathy ([place]) 10/27/2017   Tobacco abuse 10/27/2017   Nonspecific chest pain    Dizziness 05/16/2017   Osteoporosis 05/24/2016   CKD (chronic kidney disease) stage 3, GFR 30-59 ml/min ([place]) 05/08/2016   Hyperlipidemia associated with type 2 diabetes mellitus ([place]) 01/12/2016   Hypothyroidism 01/12/2016   Hypertension associated with diabetes ([place]) 01/12/2016   Allergic rhinitis 01/12/2016   Moderate major depression, single  episode ([place]) 01/12/2016   Chronic insomnia 01/12/2016   GERD (gastroesophageal reflux disease) 01/12/2016    Otelia Limes, PTA 12/05/2021, 9:05 AM  [facility] @ [place], [place], [address] Phone: [phone]   Fax:  [phone]  Name: Tiya Schrupp Braddy MRN: [id number] Date of Birth: 09-Dec-1950

## 2021-12-07 ENCOUNTER — Ambulatory Visit: Payer: PPO | Attending: Hematology and Oncology | Admitting: Physical Therapy

## 2021-12-07 ENCOUNTER — Other Ambulatory Visit: Payer: Self-pay

## 2021-12-07 ENCOUNTER — Encounter: Payer: Self-pay | Admitting: Physical Therapy

## 2021-12-07 DIAGNOSIS — C50211 Malignant neoplasm of upper-inner quadrant of right female breast: Secondary | ICD-10-CM | POA: Insufficient documentation

## 2021-12-07 DIAGNOSIS — Z483 Aftercare following surgery for neoplasm: Secondary | ICD-10-CM | POA: Diagnosis not present

## 2021-12-07 DIAGNOSIS — Z170 Estrogen receptor positive status [ER+]: Secondary | ICD-10-CM | POA: Diagnosis not present

## 2021-12-07 DIAGNOSIS — R293 Abnormal posture: Secondary | ICD-10-CM | POA: Diagnosis not present

## 2021-12-07 NOTE — Therapy (Signed)
?OUTPATIENT PHYSICAL THERAPY TREATMENT NOTE ? ?Progress Note ?Reporting Period 08/16/2021 to 12/07/2021 ? ?See note below for Objective Data and Assessment of Progress/Goals.  ? ?  ? ?Patient Name: April Ross ?MRN: [id number] ?DOB:1951-06-27, 71 y.o., female ?Today's Date: 12/07/2021 ? ?PCP: Jinny Sanders, MD ?REFERRING PROVIDER: Nicholas Lose, MD ? ? PT End of Session - 12/07/21 0955   ? ? Visit Number 10   ? Number of Visits 16   ? Date for PT Re-Evaluation 01/01/22   ? PT Start Time [id number]   ? PT Stop Time 0955   ? PT Time Calculation (min) 52 min   ? Activity Tolerance Patient tolerated treatment well   ? Behavior During Therapy [facility] for tasks assessed/performed   ? ?  ?  ? ?  ? ? ?Past Medical History:  ?Diagnosis Date  ? Allergy   ? Arthritis   ? CRD (chronic renal disease)   ? stage 3  ? Depression   ? Diabetes mellitus without complication ([place])   ? GERD (gastroesophageal reflux disease)   ? Hyperlipidemia   ? Hypertension   ? LBBB (left bundle branch block)   ? a. diagnosed in 10/2017 - echo showed a preserved EF of 55-60% and NST was low-risk showing no evidence of ischemia.   ? Osteoporosis   ? polyps in colon   ? Thyroid disease   ? Urinary tract infection   ? ?Past Surgical History:  ?Procedure Laterality Date  ? ABDOMINAL HYSTERECTOMY    ? APPENDECTOMY    ? BREAST BIOPSY Right   ? BREAST LUMPECTOMY Right 07/2021  ? BREAST LUMPECTOMY WITH RADIOACTIVE SEED AND SENTINEL LYMPH NODE BIOPSY Right 07/24/2021  ? Procedure: RIGHT BREAST LUMPECTOMY WITH RADIOACTIVE SEED AND SENTINEL LYMPH NODE BIOPSY;  Surgeon: Jovita Kussmaul, MD;  Location: [facility];  Service: General;  Laterality: Right;  ? ?Patient Active Problem List  ? Diagnosis Date Noted  ? Chronic tension-type headache, not intractable 07/25/2021  ? Carcinoma of upper-inner quadrant of right breast in female, estrogen receptor positive ([place]) 06/15/2021  ? Diabetic retinopathy ([place]) 11/24/2020  ? ETD (Eustachian tube dysfunction), right  08/04/2019  ? Chronic bilateral thoracic back pain 11/21/2018  ? Type 2 diabetes mellitus with nephropathy ([place]) 10/27/2017  ? Tobacco abuse 10/27/2017  ? Nonspecific chest pain   ? Dizziness 05/16/2017  ? Osteoporosis 05/24/2016  ? CKD (chronic kidney disease) stage 3, GFR 30-59 ml/min (H[facility]) 05/08/2016  ? Hyperlipidemia associated with type 2 diabetes mellitus ([place]) 01/12/2016  ? Hypothyroidism 01/12/2016  ? Hypertension associated with diabetes ([place]) 01/12/2016  ? Allergic rhinitis 01/12/2016  ? Moderate major depression, single episode ([place]) 01/12/2016  ? Chronic insomnia 01/12/2016  ? GERD (gastroesophageal reflux disease) 01/12/2016  ? ? ?REFERRING DIAG: right breast cancer  ? ?THERAPY DIAG:  ?Aftercare following surgery for neoplasm ? ?Carcinoma of upper-inner quadrant of right breast in female, estrogen receptor positive ([place]) ? ?Abnormal posture ? ?PERTINENT HISTORY: Pt will be had a Rt lumpectomy with 2/11 lymph nodes positive on 07/24/21.Had infection and drainage just proximal to incision site during radiatio Done with radiation 2/7/20923 . No chemotherapy and now on antiestrogen.   Pt has an increase in SOZO and so is now wearing a sleeve for 4 weeks  other history includes: DM, stage 3 CKD, osteoporosis ? ?PRECAUTIONS: at risk for lymphedema  ? ?SUBJECTIVE: pt states she had pain a few hours after last treatment but not during it.  ? ?PAIN:  ?Are  you having pain? No ? ?Today's treatment : in supine with right arm supported on pillow: Manual lymph drainage: short neck, superficial and deep abdominals, left axillary nodes and anterior interaxillary anastamosis, right  inguinal nodes and gently to right axilla and axillo-inguinal anastamosis, right shoulder, upper arm, elbow and lower arm with return along pathways.  Then to left sidelying for posterior interaxillary anastamosis. Pt encouraged to to continue deep breathing and to wear prarie compression bra and sleeve.  Gave pt thin film to wear at top of  bra at axilla to give better coverage to this area as well as a thin piece of foam to wear at medical forearm where she is having some irritation from sleeve.  ? ? PT Long Term Goals - 11/20/21 0826   ? ?  ? PT LONG TERM GOAL #1  ? Title Pt will return to baseline AROM   ? Status Achieved   ?  ? PT LONG TERM GOAL #2  ? Title Pt will decrease resting axillary pain to 0/10   ? Status Achieved   ?  ? PT LONG TERM GOAL #3  ? Title Pt will decrease tightness in Rt UE with reach by at least 50%   ? Baseline 6/10 tightness/pain with reach   ? Time 0.01   ? Status New   ? ?  ?  ? ?  ? ? ? ? ? ? ? ? ? ? ?Patient will benefit from skilled therapeutic intervention in order to improve the following deficits and impairments:    ? ?Visit Diagnosis: ?Aftercare following surgery for neoplasm ? ?Carcinoma of upper-inner quadrant of right breast in female, estrogen receptor positive ([place]) ? ?Abnormal posture ? ? ? ? ?Problem List ?Patient Active Problem List  ? Diagnosis Date Noted  ? Chronic tension-type headache, not intractable 07/25/2021  ? Carcinoma of upper-inner quadrant of right breast in female, estrogen receptor positive ([place]) 06/15/2021  ? Diabetic retinopathy ([place]) 11/24/2020  ? ETD (Eustachian tube dysfunction), right 08/04/2019  ? Chronic bilateral thoracic back pain 11/21/2018  ? Type 2 diabetes mellitus with nephropathy ([place]) 10/27/2017  ? Tobacco abuse 10/27/2017  ? Nonspecific chest pain   ? Dizziness 05/16/2017  ? Osteoporosis 05/24/2016  ? CKD (chronic kidney disease) stage 3, GFR 30-59 ml/min (H[facility]) 05/08/2016  ? Hyperlipidemia associated with type 2 diabetes mellitus ([place]) 01/12/2016  ? Hypothyroidism 01/12/2016  ? Hypertension associated with diabetes ([place]) 01/12/2016  ? Allergic rhinitis 01/12/2016  ? Moderate major depression, single episode ([place]) 01/12/2016  ? Chronic insomnia 01/12/2016  ? GERD (gastroesophageal reflux disease) 01/12/2016  ? ?Donato Heinz. Owens Shark, PT  ?Norwood Levo, PT ?12/07/2021, 1:40  PM ? ?[facility] ?[facility] @ [place] ?[place], [place], [address] ?Phone: [phone]   Fax:  [phone] ? ?Name: Jacqulin Brandenburger Mula ?MRN: [id number] ?Date of Birth: 1950-12-02 ? ? ? ?(Copy Eval's Objective through Plan section here) ? ? ?Norwood Levo, PT ?12/07/2021, 1:40 PM ? ?   ?

## 2021-12-08 ENCOUNTER — Other Ambulatory Visit (INDEPENDENT_AMBULATORY_CARE_PROVIDER_SITE_OTHER): Payer: PPO

## 2021-12-08 DIAGNOSIS — E1121 Type 2 diabetes mellitus with diabetic nephropathy: Secondary | ICD-10-CM | POA: Diagnosis not present

## 2021-12-08 DIAGNOSIS — E039 Hypothyroidism, unspecified: Secondary | ICD-10-CM

## 2021-12-08 LAB — COMPREHENSIVE METABOLIC PANEL
ALT: 25 U/L (ref 0–35)
AST: 24 U/L (ref 0–37)
Albumin: 4.3 g/dL (ref 3.5–5.2)
Alkaline Phosphatase: 65 U/L (ref 39–117)
BUN: 17 mg/dL (ref 6–23)
CO2: 28 mEq/L (ref 19–32)
Calcium: 9.8 mg/dL (ref 8.4–10.5)
Chloride: 104 mEq/L (ref 96–112)
Creatinine, Ser: 1.35 mg/dL — ABNORMAL HIGH (ref 0.40–1.20)
GFR: 39.18 mL/min — ABNORMAL LOW (ref >60.00)
Glucose, Bld: 160 mg/dL — ABNORMAL HIGH (ref 70–99)
Potassium: 4 mEq/L (ref 3.5–5.1)
Sodium: 139 mEq/L (ref 135–145)
Total Bilirubin: 0.5 mg/dL (ref 0.2–1.2)
Total Protein: 6.5 g/dL (ref 6.0–8.3)

## 2021-12-08 LAB — LIPID PANEL
Cholesterol: 153 mg/dL (ref 0–200)
HDL: 30.8 mg/dL — ABNORMAL LOW (ref >39.00)
NonHDL: 122.25
Total CHOL/HDL Ratio: 5
Triglycerides: 277 mg/dL — ABNORMAL HIGH (ref 0.0–149.0)
VLDL: 55.4 mg/dL — ABNORMAL HIGH (ref 0.0–40.0)

## 2021-12-08 LAB — HEMOGLOBIN A1C: Hgb A1c MFr Bld: 7.8 % — ABNORMAL HIGH (ref 4.6–6.5)

## 2021-12-08 LAB — T4, FREE: Free T4: 1.15 ng/dL (ref 0.60–1.60)

## 2021-12-08 LAB — T3, FREE: T3, Free: 3.2 pg/mL (ref 2.3–4.2)

## 2021-12-08 LAB — TSH: TSH: 2.37 u[IU]/mL (ref 0.35–5.50)

## 2021-12-08 LAB — LDL CHOLESTEROL, DIRECT: Direct LDL: 85 mg/dL

## 2021-12-11 ENCOUNTER — Other Ambulatory Visit: Payer: Self-pay

## 2021-12-11 ENCOUNTER — Ambulatory Visit
Admission: RE | Admit: 2021-12-11 | Discharge: 2021-12-11 | Disposition: A | Discharge status: Home / Self Care | Payer: PPO | Source: Ambulatory Visit | Attending: Radiation Oncology | Admitting: Radiation Oncology

## 2021-12-11 DIAGNOSIS — C50211 Malignant neoplasm of upper-inner quadrant of right female breast: Secondary | ICD-10-CM | POA: Insufficient documentation

## 2021-12-11 DIAGNOSIS — Z170 Estrogen receptor positive status [ER+]: Secondary | ICD-10-CM | POA: Insufficient documentation

## 2021-12-11 NOTE — Progress Notes (Signed)
No critical labs need to be addressed urgently. We will discuss labs in detail at upcoming office visit.   

## 2021-12-11 NOTE — Progress Notes (Signed)
?  Radiation Oncology         [phone] ?________________________________ ? ?Name: April Ross MRN: [id number]  ?Date of Service: 12/11/2021  DOB: Mar 04, 1951 ? ?Post Treatment Telephone Note ? ?Diagnosis:   Stage IB, pT2N1M0, grade1,  ER/PR positive, invasive lobular carcinoma of the right breast.  ? ?Intent: Curative ? ?Radiation Treatment Dates: 09/27/2021 through 11/14/2021 ?Site Technique Total Dose (Gy) Dose per Fx (Gy) Completed Fx Beam Energies  ?Breast, Right: Breast_R 3D 50.4/50.4 1.8 28/28 10XFFF  ?Breast, Right: Breast_R_SCLV 3D 50.4/50.4 1.8 28/28 6X, 10X  ?Breast, Right: Breast_R_Bst 3D 10/10 2 5/5 6X, 10X  ? ?Narrative: The patient tolerated radiation therapy relatively well. She developed fatigue and anticipated skin changes in the treatment field. She did have a delayed seroma that became infected and was treated with antibiotics and aspiration.  ? ? ?Impression/Plan: ?1. Stage IB, pT2N1M0, grade1,  ER/PR positive, invasive lobular carcinoma of the right breast.  I was unable to reach the patient but left a voicemail and on the message, I discussed that we would be happy to continue to follow her as needed, but she will also continue to follow up with Dr. Lindi Adie in medical oncology. She was counseled to call if she had questions about skin care or measures to avoid sun exposure to this area.   ? ? ? ? ? ?Carola Rhine, PAC  ? ? ? ? ?

## 2021-12-12 ENCOUNTER — Other Ambulatory Visit: Payer: Self-pay

## 2021-12-12 ENCOUNTER — Ambulatory Visit: Payer: PPO

## 2021-12-12 DIAGNOSIS — Z170 Estrogen receptor positive status [ER+]: Secondary | ICD-10-CM

## 2021-12-12 DIAGNOSIS — R293 Abnormal posture: Secondary | ICD-10-CM

## 2021-12-12 DIAGNOSIS — Z483 Aftercare following surgery for neoplasm: Secondary | ICD-10-CM

## 2021-12-12 DIAGNOSIS — C50211 Malignant neoplasm of upper-inner quadrant of right female breast: Secondary | ICD-10-CM

## 2021-12-12 NOTE — Therapy (Signed)
?OUTPATIENT PHYSICAL THERAPY TREATMENT NOTE ? ? ?Patient Name: April Ross ?MRN: [id number] ?DOB:1951/09/11, 71 y.o., female ?Today's Date: 12/12/2021 ? ?PCP: Jinny Sanders, MD ?REFERRING PROVIDER: Nicholas Lose, MD ? ? PT End of Session - 12/12/21 0813   ? ? Visit Number 11   ? Number of Visits 16   ? Date for PT Re-Evaluation 01/01/22   ? PT Start Time 0805   ? PT Stop Time 0900   ? PT Time Calculation (min) 55 min   ? Activity Tolerance Patient tolerated treatment well   ? Behavior During Therapy [facility] for tasks assessed/performed   ? ?  ?  ? ?  ? ? ?Past Medical History:  ?Diagnosis Date  ? Allergy   ? Arthritis   ? CRD (chronic renal disease)   ? stage 3  ? Depression   ? Diabetes mellitus without complication ([facility] [facility])   ? GERD (gastroesophageal reflux disease)   ? Hyperlipidemia   ? Hypertension   ? LBBB (left bundle branch block)   ? a. diagnosed in 10/2017 - echo showed a preserved EF of 55-60% and NST was low-risk showing no evidence of ischemia.   ? Osteoporosis   ? polyps in colon   ? Thyroid disease   ? Urinary tract infection   ? ?Past Surgical History:  ?Procedure Laterality Date  ? ABDOMINAL HYSTERECTOMY    ? APPENDECTOMY    ? BREAST BIOPSY Right   ? BREAST LUMPECTOMY Right 07/2021  ? BREAST LUMPECTOMY WITH RADIOACTIVE SEED AND SENTINEL LYMPH NODE BIOPSY Right 07/24/2021  ? Procedure: RIGHT BREAST LUMPECTOMY WITH RADIOACTIVE SEED AND SENTINEL LYMPH NODE BIOPSY;  Surgeon: Jovita Kussmaul, MD;  Location: [place];  Service: General;  Laterality: Right;  ? ?Patient Active Problem List  ? Diagnosis Date Noted  ? Chronic tension-type headache, not intractable 07/25/2021  ? Carcinoma of upper-inner quadrant of right breast in female, estrogen receptor positive ([place]) 06/15/2021  ? Diabetic retinopathy ([place]) 11/24/2020  ? ETD (Eustachian tube dysfunction), right 08/04/2019  ? Chronic bilateral thoracic back pain 11/21/2018  ? Type 2 diabetes mellitus with nephropathy ([place]) 10/27/2017  ? Tobacco  abuse 10/27/2017  ? Nonspecific chest pain   ? Dizziness 05/16/2017  ? Osteoporosis 05/24/2016  ? CKD (chronic kidney disease) stage 3, GFR 30-59 ml/min (HCC) 05/08/2016  ? Hyperlipidemia associated with type 2 diabetes mellitus ([place]) 01/12/2016  ? Hypothyroidism 01/12/2016  ? Hypertension associated with diabetes ([place]) 01/12/2016  ? Allergic rhinitis 01/12/2016  ? Moderate major depression, single episode ([place]) 01/12/2016  ? Chronic insomnia 01/12/2016  ? GERD (gastroesophageal reflux disease) 01/12/2016  ? ? ?REFERRING DIAG: right breast cancer ? ?THERAPY DIAG:  ?Aftercare following surgery for neoplasm ?  ?Carcinoma of upper-inner quadrant of right breast in female, estrogen receptor positive ([place]) ?  ?Abnormal posture ? ?PERTINENT HISTORY: Pt will be had a Rt lumpectomy with 2/11 lymph nodes positive on 07/24/21.Had infection and drainage just proximal to incision site during radiatio Done with radiation 2/7/20923 . No chemotherapy and now on antiestrogen.   Pt has an increase in SOZO and so is now wearing a sleeve for 4 weeks  other history includes: DM, stage 3 CKD, osteoporosis will be had a Rt lumpectomy with 2/11 lymph nodes positive on 07/24/21.Had infection and drainage just proximal to incision site during radiatio Done with radiation 2/7/20923 . No chemotherapy and now on antiestrogen.   Pt has an increase in SOZO and so is now wearing a sleeve for  4 weeks  other history includes: DM, stage 3 CKD, osteoporosis ? ?PRECAUTIONS: at risk for lymphedema ? ?SUBJECTIVE: I've been wearing the compression sleeve every day. It's still really uncomfortable but I'm wearing it. I've had some tenderness at my breast incision the past few days, not sure what that is about. The cording is seeming to improve some though. S ? ?PAIN:  ?Are you having pain? No ? ? ? ?Today's treatment :  ?12/12/2021: Manual therapy ?In Supine: Manual lymph drainage as follows: short neck, superficial and deep abdominals, left axillary nodes  and anterior interaxillary anastamosis, right  inguinal nodes and gently to right axilla and axillo-inguinal anastamosis, right shoulder, upper arm, elbow and lower arm with return along pathways. ?MFR: to Rt areas of cording in axilla and into upper arm though this is much less noticeable now than at start of care ?P/ROM: In Supine to Rt shoulder into flexion, abduction and D2 to pts available end ROM ? ?12/07/2021: in supine with right arm supported on pillow: Manual lymph drainage: short neck, superficial and deep abdominals, left axillary nodes and anterior interaxillary anastamosis, right  inguinal nodes and gently to right axilla and axillo-inguinal anastamosis, right shoulder, upper arm, elbow and lower arm with return along pathways.  Then to left sidelying for posterior interaxillary anastamosis. Pt encouraged to to continue deep breathing and to wear prarie compression bra and sleeve.  Gave pt thin film to wear at top of bra at axilla to give better coverage to this area as well as a thin piece of foam to wear at medical forearm where she is having some irritation from sleeve.  ? ?Plan - 11/20/21 0823   ?  ?  Clinical Impression Statement Pts P/ROM of Rt shoulder conts to improve with each session. Her cording is much less noticeable now as well and pt reports though she can still feel the pull from this, it is less than it was. She conts to wear her compression sleeve daily until her 30 day reassess with SOZO which will be 12/14/21.  ?  Personal Factors and Comorbidities Age;Comorbidity 3+   ?  Comorbidities DM, osteoporosis, CKD   ?  Stability/Clinical Decision Making Stable/Uncomplicated   ?  Clinical Decision Making Low   ?  Rehab Potential Excellent   ?  PT Frequency 2x / week   ?  PT Duration 4 weeks   ?  PT Treatment/Interventions ADLs/Self Care Home Management;Patient/family education;Therapeutic exercise;Manual techniques   ?  PT Next Visit Plan SOZO next for 30 day reassess; cont manual therapy  working to decrease cording symptoms as done today  ?  PT Home Exercise Plan Access Code: [email or web address] and post op supine   ?  Consulted and Agree with Plan of Care Patient   ?  ? ?Otelia Limes, PTA ?12/12/2021, 9:06 AM ? ?  ? ?

## 2021-12-14 ENCOUNTER — Ambulatory Visit: Payer: PPO

## 2021-12-14 ENCOUNTER — Other Ambulatory Visit: Payer: Self-pay

## 2021-12-14 VITALS — Wt 151.0 lb

## 2021-12-14 DIAGNOSIS — R293 Abnormal posture: Secondary | ICD-10-CM

## 2021-12-14 DIAGNOSIS — Z483 Aftercare following surgery for neoplasm: Secondary | ICD-10-CM | POA: Diagnosis not present

## 2021-12-14 DIAGNOSIS — C50211 Malignant neoplasm of upper-inner quadrant of right female breast: Secondary | ICD-10-CM

## 2021-12-14 NOTE — Patient Instructions (Signed)
Start with circles near neck placing hands above collarbones. 5 times each side ? ?Deep Effective Breath ? ? ?Standing, sitting, or laying down, place both hands on the belly. Take a deep breath IN, expanding the belly; then breath OUT, contracting the belly. ?Repeat __5__ times. Do __2-3__ sessions per day and before your self massage. ? ?Axilla to Axilla - Sweep ? ? ?On uninvolved side make 5 circles in the armpit, then pump _5__ times from involved armpit across chest to uninvolved armpit, making a pathway. ?Do _1__ time per day. ? ?Copyright ? VHI. All rights reserved.  ?Axilla to Inguinal Nodes - Sweep ? ? ?On involved side, make 5 circles at groin at panty line, then pump _5__ times from armpit along side of trunk to outer hip, making your other pathway. ?Do __1_ time per day. ? ?Copyright ? VHI. All rights reserved.  ?Arm Posterior: Elbow to Shoulder - Sweep ? ? ?Pump _5__ times from back of elbow to top of shoulder. Then inner to outer upper arm _5_ times, then outer arm again _5_ times. Then back to the pathways _2-3_ times. ?Do _1__ time per day. ? ?Copyright ? VHI. All rights reserved.  ?ARM: [place] Wrist to Elbow - Sweep ? ? ?Pump or stationary circles _5__ times from wrist to elbow making sure to do both sides of the forearm. Then retrace your steps to the outer arm, and the pathways _2-3_ times each. ?Do _1__ time per day. ? ?Copyright ? VHI. All rights reserved.  ?ARM: Dorsum of Hand to Shoulder - Sweep ? ? ?Pump or stationary circles _5__ times on back of hand including knuckle spaces and individual fingers if needed working up towards the wrist, then retrace all your steps working back up the forearm, doing both sides; upper outer arm and back to your pathways _2-3_ times each. Then do 5 circles again at uninvolved armpit and involved groin where you started! Good job!! ?Do __1_ time per day. ? ?Copyright ? VHI. All rights reserved.  ? ? ?  ?

## 2021-12-14 NOTE — Therapy (Signed)
OUTPATIENT PHYSICAL THERAPY TREATMENT NOTE   Patient Name: April Ross MRN: [id number] DOB:August 19, 1951, 71 y.o., female Today's Date: 12/14/2021  PCP: Jinny Sanders, MD REFERRING PROVIDER: Jovita Kussmaul, MD   PT End of Session - 12/14/21 0904     Visit Number 12    Number of Visits 16    Date for PT Re-Evaluation 01/01/22    PT Start Time 0802    PT Stop Time 0904    PT Time Calculation (min) 62 min    Activity Tolerance Patient tolerated treatment well    Behavior During Therapy [facility] for tasks assessed/performed              Past Medical History:  Diagnosis Date   Allergy    Arthritis    CRD (chronic renal disease)    stage 3   Depression    Diabetes mellitus without complication (HCC)    GERD (gastroesophageal reflux disease)    Hyperlipidemia    Hypertension    LBBB (left bundle branch block)    a. diagnosed in 10/2017 - echo showed a preserved EF of 55-60% and NST was low-risk showing no evidence of ischemia.    Osteoporosis    polyps in colon    Thyroid disease    Urinary tract infection    Past Surgical History:  Procedure Laterality Date   ABDOMINAL HYSTERECTOMY     APPENDECTOMY     BREAST BIOPSY Right    BREAST LUMPECTOMY Right 07/2021   BREAST LUMPECTOMY WITH RADIOACTIVE SEED AND SENTINEL LYMPH NODE BIOPSY Right 07/24/2021   Procedure: RIGHT BREAST LUMPECTOMY WITH RADIOACTIVE SEED AND SENTINEL LYMPH NODE BIOPSY;  Surgeon: Jovita Kussmaul, MD;  Location: [place];  Service: General;  Laterality: Right;   Patient Active Problem List   Diagnosis Date Noted   Chronic tension-type headache, not intractable 07/25/2021   Carcinoma of upper-inner quadrant of right breast in female, estrogen receptor positive ([place]) 06/15/2021   Diabetic retinopathy ([place]) 11/24/2020   ETD (Eustachian tube dysfunction), right 08/04/2019   Chronic bilateral thoracic back pain 11/21/2018   Type 2 diabetes mellitus with nephropathy ([place]) 10/27/2017    Tobacco abuse 10/27/2017   Nonspecific chest pain    Dizziness 05/16/2017   Osteoporosis 05/24/2016   CKD (chronic kidney disease) stage 3, GFR 30-59 ml/min (HCC) 05/08/2016   Hyperlipidemia associated with type 2 diabetes mellitus ([place]) 01/12/2016   Hypothyroidism 01/12/2016   Hypertension associated with diabetes ([place]) 01/12/2016   Allergic rhinitis 01/12/2016   Moderate major depression, single episode ([place]) 01/12/2016   Chronic insomnia 01/12/2016   GERD (gastroesophageal reflux disease) 01/12/2016    REFERRING DIAG: right breast cancer  THERAPY DIAG:  Aftercare following surgery for neoplasm   Carcinoma of upper-inner quadrant of right breast in female, estrogen receptor positive ([place])   Abnormal posture  PERTINENT HISTORY: Pt will be had a Rt lumpectomy with 2/11 lymph nodes positive on 07/24/21.Had infection and drainage just proximal to incision site during radiatio Done with radiation 2/7/20923 . No chemotherapy and now on antiestrogen.   Pt has an increase in SOZO and so is now wearing a sleeve for 4 weeks  other history includes: DM, stage 3 CKD, osteoporosis will be had a Rt lumpectomy with 2/11 lymph nodes positive on 07/24/21.Had infection and drainage just proximal to incision site during radiatio Done with radiation 2/7/20923 . No chemotherapy and now on antiestrogen.   Pt has an increase in SOZO and so is now wearing a  sleeve for 4 weeks  other history includes: DM, stage 3 CKD, osteoporosis  PRECAUTIONS: at risk for lymphedema  SUBJECTIVE: "I don't think my SOZO number is going to be much better. My arm still feels the same."   PAIN:  Are you having pain? No    Today's treatment :  12/14/2021: Manual therapy In Supine: Manual lymph drainage as follows: short neck, superficial and deep abdominals, left axillary nodes and anterior interaxillary anastamosis, right  inguinal nodes and gently to right axilla and axillo-inguinal anastamosis, right shoulder, upper arm,  elbow and lower arm with return along pathways having pt return demo. Handout also issued MFR: to Rt areas of cording in axilla and into upper arm though this is much less noticeable now than at start of care P/ROM: In Supine to Rt shoulder into flexion, abduction and D2 to pts available end ROM  12/12/2021:  Manual therapy In Supine: Manual lymph drainage as follows: short neck, superficial and deep abdominals, left axillary nodes and anterior interaxillary anastamosis, right  inguinal nodes and gently to right axilla and axillo-inguinal anastamosis, right shoulder, upper arm, elbow and lower arm with return along pathways. MFR: to Rt areas of cording in axilla and into upper arm though this is much less noticeable now than at start of care P/ROM: In Supine to Rt shoulder into flexion, abduction and D2 to pts available end ROM  12/07/2021: in supine with right arm supported on pillow: Manual lymph drainage: short neck, superficial and deep abdominals, left axillary nodes and anterior interaxillary anastamosis, right  inguinal nodes and gently to right axilla and axillo-inguinal anastamosis, right shoulder, upper arm, elbow and lower arm with return along pathways.  Then to left sidelying for posterior interaxillary anastamosis. Pt encouraged to to continue deep breathing and to wear prarie compression bra and sleeve.  Gave pt thin film to wear at top of bra at axilla to give better coverage to this area as well as a thin piece of foam to wear at medical forearm where she is having some irritation from sleeve.   PATIENT EDUCATION: Education details: yes Person educated: Patient Education method: Consulting civil engineer, Demonstration, Corporate treasurer cues, and Handouts Education comprehension: verbalized understanding, returned demonstration, tactile cues required, and needs further education   HOME EXERCISE PROGRAM: Self MLD for Rt UE   L-DEX FLOWSHEETS - 11/16/21 0900              12/14/2021    L-DEX LYMPHEDEMA  SCREENING     Measurement Type Unilateral      L-DEX MEASUREMENT EXTREMITY Upper Extremity      POSITION  Standing      DOMINANT SIDE Right      At Risk Side Right      BASELINE SCORE (UNILATERAL) -5.9      L-DEX SCORE (UNILATERAL) 14.5  9.9    VALUE CHANGE (UNILAT) 20.4  15.8    Comment pt was issued size 2 medi harmony sleeve with instruction on use due to continued increase in SOZO score.  Did not have matching size gauntlet so gave pt instruction to use the sleeve only for now and order the gauntlet if the back of the hand starts getting puffy.  Pt will wear this x 4 weeks with remeasure.  Pt was referred back for PT sessions and has an eval soon so we will add MLD and sozo to this      Plan - 11/20/21 0823       Clinical Impression Statement SOZO screen  redone after pt wore her compression sleeve x30 days. Her change from baseline is decreased from last one but is still elevated above normal limits at 15.8 so pt was instructed to cont with wear of compression sleeve x30 days. Also added instruction of self MLD while performing this today. She was able to return good demo of this after instruction and handout was issued as well. Pt would also benefit from an OTS flat knit sleeve at this time as her circumference measurements are also increasd from when she was initially measured pre operatively. Per her request will fax her demographics to [facility] to see if there is coverage. Will see if [employer] will cover one if not.    Personal Factors and Comorbidities Age;Comorbidity 3+     Comorbidities DM, osteoporosis, CKD     Stability/Clinical Decision Making Stable/Uncomplicated     Clinical Decision Making Low     Rehab Potential Excellent     PT Frequency 2x / week     PT Duration 4 weeks     PT Treatment/Interventions ADLs/Self Care Home Management;Patient/family education;Therapeutic exercise;Manual techniques     PT Next Visit Plan Hear back from [facility]? Rt UE/axilla into forearm cording  release and PROM, cont and instruct issuing handout MLD of the Rt arm as pt is using sleeve for increased SOZO and education on this.  repeat SOZO around 01/14/22; and continue every 3 month surveillance    PT Home Exercise Plan Access Code: [email or web address] and post op supine     Consulted and Agree with Plan of Care Patient      Tessa, [place], PTA 12/14/2021, 12:58 PM

## 2021-12-15 ENCOUNTER — Encounter: Payer: Self-pay | Admitting: Family Medicine

## 2021-12-15 ENCOUNTER — Other Ambulatory Visit: Payer: Self-pay | Admitting: Family Medicine

## 2021-12-15 ENCOUNTER — Ambulatory Visit (INDEPENDENT_AMBULATORY_CARE_PROVIDER_SITE_OTHER): Payer: PPO | Admitting: Family Medicine

## 2021-12-15 VITALS — BP 160/80 | HR 80 | Temp 98.4°F | Ht 62.25 in | Wt 151.2 lb

## 2021-12-15 DIAGNOSIS — C50211 Malignant neoplasm of upper-inner quadrant of right female breast: Secondary | ICD-10-CM

## 2021-12-15 DIAGNOSIS — F321 Major depressive disorder, single episode, moderate: Secondary | ICD-10-CM

## 2021-12-15 DIAGNOSIS — E1121 Type 2 diabetes mellitus with diabetic nephropathy: Secondary | ICD-10-CM | POA: Diagnosis not present

## 2021-12-15 DIAGNOSIS — Z720 Tobacco use: Secondary | ICD-10-CM

## 2021-12-15 DIAGNOSIS — N1831 Chronic kidney disease, stage 3a: Secondary | ICD-10-CM

## 2021-12-15 DIAGNOSIS — E1169 Type 2 diabetes mellitus with other specified complication: Secondary | ICD-10-CM

## 2021-12-15 DIAGNOSIS — E785 Hyperlipidemia, unspecified: Secondary | ICD-10-CM | POA: Diagnosis not present

## 2021-12-15 DIAGNOSIS — Z170 Estrogen receptor positive status [ER+]: Secondary | ICD-10-CM

## 2021-12-15 DIAGNOSIS — E11319 Type 2 diabetes mellitus with unspecified diabetic retinopathy without macular edema: Secondary | ICD-10-CM | POA: Diagnosis not present

## 2021-12-15 DIAGNOSIS — Z0000 Encounter for general adult medical examination without abnormal findings: Secondary | ICD-10-CM

## 2021-12-15 DIAGNOSIS — E039 Hypothyroidism, unspecified: Secondary | ICD-10-CM

## 2021-12-15 DIAGNOSIS — I152 Hypertension secondary to endocrine disorders: Secondary | ICD-10-CM

## 2021-12-15 DIAGNOSIS — E1159 Type 2 diabetes mellitus with other circulatory complications: Secondary | ICD-10-CM | POA: Diagnosis not present

## 2021-12-15 NOTE — Assessment & Plan Note (Signed)
Chronic , improving ?

## 2021-12-15 NOTE — Assessment & Plan Note (Signed)
Elevated in office today.. usually well controlled on ARB and amlodipine. ? Will follow at home. ?

## 2021-12-15 NOTE — Assessment & Plan Note (Signed)
Chronic, moderate control ? ? Not interested in counseling or medication to treat. ?

## 2021-12-15 NOTE — Assessment & Plan Note (Signed)
Chronic ? Followed by [facility] ?

## 2021-12-15 NOTE — Assessment & Plan Note (Signed)
Stable, chronic.  Continue current medication.   crestor 20 mg daily 

## 2021-12-15 NOTE — Patient Instructions (Addendum)
Continue glyburide alone for diabetes and work on low carb diet, increase exercise as tolerated. ? Follow BP at home.. call if > 140/90. ? Quit smoking as we discussed. ?

## 2021-12-15 NOTE — Assessment & Plan Note (Signed)
Active treatment on letrozole followed by Dr. Lindi Adie. ? 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

## 2021-12-15 NOTE — Assessment & Plan Note (Signed)
Stable, chronic.  Continue current medication. ? ? ?Levo 88 mcg dialy ?

## 2021-12-15 NOTE — Assessment & Plan Note (Signed)
The patient was counseled on the dangers of tobacco use, and was advised to quit.  Reviewed strategies to maximize success, including removing cigarettes and smoking materials from environment, stress management and support of family/friends. ?

## 2021-12-15 NOTE — Telephone Encounter (Signed)
Tizanidine refilled 12/01/21 but was set on print.  Refill resent to [facility]. ?

## 2021-12-15 NOTE — Progress Notes (Signed)
Patient ID: April Ross, female    DOB: 13-Oct-1950, 71 y.o.   MRN: [id number]  This visit was conducted in person.  BP (!) 160/80    Pulse 80    Temp 98.4 F (36.9 C) (Temporal)    Ht 5' 2.25" (1.581 m)    Wt 151 lb 3 oz (68.6 kg)    SpO2 98%    BMI 27.43 kg/m    CC:  Chief Complaint  Patient presents with   Medicare Wellness    Subjective:   HPI: April Ross is a 71 y.o. female presenting on 12/15/2021 for Medicare Wellness  The patient presents for annual medicare wellness, complete physical and review of chronic health problems. He/She also has the following acute concerns today:  She has been very tired since 09/2021.. when she started radiation for breast cancer.  Fatigue has continued since completing  in 2 . 2023. Medical oncologist feels this could last months. She is overwhelemed with caring for her   uncle.   1. The patient's medical and social history 2. Their use of alcohol, tobacco or illicit drugs 3. Their current medications and supplements 4. The patient's functional ability including ADL's, fall risks, home safety risks and hearing or visual             impairment. 5. Diet and physical activities 6. Evidence for depression or mood disorders 7.         Updated provider list Cognitive evaluation was performed and recorded on pt medicare questionnaire form. The patients weight, height, BMI and visual acuity have been recorded in the chart   I have made referrals, counseling and provided education to the patient based review of the above and I have provided the pt with a written personalized care plan for preventive services.   Documentation of this information was scanned into the electronic record under the media tab.   Advance directives and end of life planning reviewed in detail with patient and documented in EMR. Patient given handout on advance care directives if needed. HCPOA and living will updated if needed.  Hearing Screening  Method:  Audiometry   '500Hz'$  '1000Hz'$  '2000Hz'$  '4000Hz'$   Right ear '20 20 20 20  '$ Left ear '20 20 20 20  '$ Vision Screening - Comments:: Wears Glasses-Eye Exam with Dr. Eulas Ross at [place]. 03/29/21   No falls in last 12 months.     [place] Office Visit from 06/15/2021 in [place] at [facility] Total Score 2      Diabetes:  Inadequate control  on glyburide  10 twice daily. Stopped metformin 10/26/2021 given worries about this meds.. has been on for years.  Lab Results  Component Value Date   HGBA1C 7.8 (H) 12/08/2021  Using medications without difficulties: Hypoglycemic episodes: Hyperglycemic episodes: Feet problems: none Blood Sugars averaging: eye exam within last year:  Hypertension:    Poor control in office on amlodipine and losartan. BP Readings from Last 3 Encounters:  12/15/21 (!) 160/80  11/06/21 131/67  11/01/21 116/66  Using medication without problems or lightheadedness:  none Chest pain with exertion: none Edema: none Short of breath: none Average home BPs: Other issues:   Elevated Cholesterol: LDL at goal on  20 mg daily . Lab Results  Component Value Date   CHOL 153 12/08/2021   HDL 30.80 (L) 12/08/2021   LDLCALC 61 10/28/2017   LDLDIRECT 85.0 12/08/2021   TRIG 277.0 (H) 12/08/2021   CHOLHDL 5 12/08/2021  Using medications without problems: none Muscle aches:  none Diet compliance: she is eating healthy, low carbs. Exercise: walking 2-3 times a week. Other complaints:   Hypothyroid:  stable control on levo 88 mcg daily Lab Results  Component Value Date   TSH 2.37 12/08/2021    Breast cancer: active treatment on letrozole. Oncology note from 10/2021 reviewed. Dr. Lindi Ross  MDD, moderate control: Chronic insomnia: Hs SE to trazodone in past.  She is not interested in medication  to treat.  Relevant past medical, surgical, family and social history reviewed and updated as indicated. Interim medical history since our last visit  reviewed. Allergies and medications reviewed and updated. Outpatient Medications Prior to Visit  Medication Sig Dispense Refill   amLODipine (NORVASC) 5 MG tablet TAKE 1 TABLET (5 MG TOTAL) BY MOUTH DAILY. 90 tablet 0   cetirizine (ZYRTEC) 10 MG tablet Take 10 mg by mouth at bedtime as needed for allergies.     CINNAMON PO Take 1 tablet by mouth daily.     fluticasone (FLONASE) 50 MCG/ACT nasal spray Place 2 sprays into both nostrils daily as needed for allergies.     gabapentin (NEURONTIN) 100 MG capsule Take 100 mg by mouth 3 (three) times daily as needed.     glyBURIDE (DIABETA) 5 MG tablet Take 2 tablets (10 mg total) by mouth 2 (two) times daily with a meal. 120 tablet 1   letrozole (FEMARA) 2.5 MG tablet Take 1 tablet (2.5 mg total) by mouth daily. 90 tablet 3   levothyroxine (SYNTHROID) 88 MCG tablet TAKE 1 TABLET BY MOUTH EVERY DAY 90 tablet 0   losartan (COZAAR) 100 MG tablet TAKE 1 TABLET BY MOUTH EVERY DAY 90 tablet 0   Omega-3 Fatty Acids (FISH OIL OMEGA-3 PO) Take 1 capsule by mouth daily.     rosuvastatin (CRESTOR) 20 MG tablet TAKE 1 TABLET BY MOUTH EVERY DAY 90 tablet 3   tiZANidine (ZANAFLEX) 4 MG tablet TAKE 1 TABLET BY MOUTH AT BEDTIME AS NEEDED FOR MUSCLE SPASMS. Strength: 4 mg 90 tablet 0   traMADol (ULTRAM) 50 MG tablet Take 1 tablet (50 mg total) by mouth every 6 (six) hours as needed. 15 tablet 0   Turmeric (QC TUMERIC COMPLEX PO) Take 1 tablet by mouth daily.     VITAMIN D PO Take 2 each by mouth daily.     aspirin EC 81 MG tablet Take 81 mg by mouth daily. Swallow whole. (Patient not taking: Reported on 08/17/2021)     No facility-administered medications prior to visit.     Per HPI unless specifically indicated in ROS section below Review of Systems  Constitutional:  Negative for fatigue and fever.  HENT:  Negative for congestion.   Eyes:  Negative for pain.  Respiratory:  Negative for cough and shortness of breath.   Cardiovascular:  Negative for chest pain,  palpitations and leg swelling.       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  Genitourinary:  Negative for dysuria and vaginal bleeding.  Musculoskeletal:  Negative for back pain.  Neurological:  Negative for syncope, light-headedness and headaches.  Psychiatric/Behavioral:  Negative for dysphoric mood.   Objective:  BP (!) 160/80    Pulse 80    Temp 98.4 F (36.9 C) (Temporal)    Ht 5' 2.25" (1.581 m)    Wt 151 lb 3 oz (68.6 kg)    SpO2 98%    BMI 27.43 kg/m   Wt  Readings from Last 3 Encounters:  12/15/21 151 lb 3 oz (68.6 kg)  12/14/21 151 lb (68.5 kg)  11/06/21 150 lb 4 oz (68.2 kg)      Physical Exam    Results for orders placed or performed in visit on 12/08/21  T3, free  Result Value Ref Range   T3, Free 3.2 2.3 - 4.2 pg/mL  T4, free  Result Value Ref Range   Free T4 1.15 0.60 - 1.60 ng/dL  TSH  Result Value Ref Range   TSH 2.37 0.35 - 5.50 uIU/mL  Comprehensive metabolic panel  Result Value Ref Range   Sodium 139 135 - 145 mEq/L   Potassium 4.0 3.5 - 5.1 mEq/L   Chloride 104 96 - 112 mEq/L   CO2 28 19 - 32 mEq/L   Glucose, Bld 160 (H) 70 - 99 mg/dL   BUN 17 6 - 23 mg/dL   Creatinine, Ser 1.35 (H) 0.40 - 1.20 mg/dL   Total Bilirubin 0.5 0.2 - 1.2 mg/dL   Alkaline Phosphatase 65 39 - 117 U/L   AST 24 0 - 37 U/L   ALT 25 0 - 35 U/L   Total Protein 6.5 6.0 - 8.3 g/dL   Albumin 4.3 3.5 - 5.2 g/dL   GFR 39.18 (L) >60.00 mL/min   Calcium 9.8 8.4 - 10.5 mg/dL  Lipid panel  Result Value Ref Range   Cholesterol 153 0 - 200 mg/dL   Triglycerides 277.0 (H) 0.0 - 149.0 mg/dL   HDL 30.80 (L) >39.00 mg/dL   VLDL 55.4 (H) 0.0 - 40.0 mg/dL   Total CHOL/HDL Ratio 5    NonHDL 122.25   Hemoglobin A1c  Result Value Ref Range   Hgb A1c MFr Bld 7.8 (H) 4.6 - 6.5 %  LDL cholesterol, direct  Result Value Ref Range   Direct LDL 85.0 mg/dL    This visit occurred during the SARS-CoV-2 public health emergency.  Safety  protocols were in place, including screening questions prior to the visit, additional usage of staff PPE, and extensive cleaning of exam room while observing appropriate contact time as indicated for disinfecting solutions.   COVID 19 screen:  No recent travel or known exposure to COVID19 The patient denies respiratory symptoms of COVID 19 at this time. The importance of social distancing was discussed today.   Assessment and Plan   The patient's preventative maintenance and recommended screening tests for an annual wellness exam were reviewed in full today. Brought up to date unless services declined.  Counselled on the importance of diet, exercise, and its role in overall health and mortality. The patient's FH and SH was reviewed, including their home life, tobacco status, and drug and alcohol status.   Uptodate with PNA and flu vaccine, COVID x2  Current smoker! Counseled on smoking cessation.. she has decreased to 1/2 pack per day. He daughter has quit and she is planning on trying  S/P hysterectomy.  Mammo: 04/2021   05/2019 DEXA..  Improved on fosamax. stable 04/2021 stopped fosamax .. recheck ion 2024   Problem List Items Addressed This Visit     Carcinoma of upper-inner quadrant of right breast in female, estrogen receptor positive ([place])    Active treatment on letrozole followed by Dr. Lindi Ross.  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  Continue current medication.   crestor 20 mg daily      Hypertension associated with diabetes ([place])    Elevated in office today.. usually well controlled on ARB and amlodipine.  Will follow at home.      Hypothyroidism    Stable, chronic.  Continue current medication.   Levo 88 mcg  dialy      Moderate major depression, single episode (HCC)    Chronic, moderate control   Not interested in counseling or medication to treat.      Tobacco abuse    The patient was counseled on the dangers of tobacco use, and was advised to quit.  Reviewed strategies to maximize success, including removing cigarettes and smoking materials from environment, stress management and support of family/friends.      Type 2 diabetes mellitus with nephropathy (HCC)    Chronic   Worsened control  Now off metfomrin and with increased stress with cancer and family health issues.  Will continue glyburide at max .  May need additional med ( non metformin given renal function issues and pt concerns) if not at goal in 3 months.      Other Visit Diagnoses     Medicare annual wellness visit, subsequent    -  Primary       Eliezer Lofts, MD

## 2021-12-15 NOTE — Assessment & Plan Note (Addendum)
Chronic ? ? Worsened control  Now off metfomrin and with increased stress with cancer and family health issues. ? Will continue glyburide at max . ? May need additional med ( non metformin given renal function issues and pt concerns) if not at goal in 3 months. ?

## 2021-12-17 ENCOUNTER — Other Ambulatory Visit: Payer: Self-pay | Admitting: Family Medicine

## 2021-12-18 ENCOUNTER — Ambulatory Visit: Payer: PPO

## 2021-12-19 ENCOUNTER — Ambulatory Visit: Payer: PPO

## 2021-12-19 ENCOUNTER — Other Ambulatory Visit: Payer: Self-pay

## 2021-12-19 DIAGNOSIS — Z483 Aftercare following surgery for neoplasm: Secondary | ICD-10-CM | POA: Diagnosis not present

## 2021-12-19 DIAGNOSIS — C50211 Malignant neoplasm of upper-inner quadrant of right female breast: Secondary | ICD-10-CM

## 2021-12-19 DIAGNOSIS — R293 Abnormal posture: Secondary | ICD-10-CM

## 2021-12-19 NOTE — Therapy (Signed)
?OUTPATIENT PHYSICAL THERAPY TREATMENT NOTE ? ? ?Patient Name: April Ross ?MRN: [id number] ?DOB:02/18/1951, 71 y.o., female ?Today's Date: 12/19/2021 ? ?PCP: Jinny Sanders, MD ?REFERRING PROVIDER: Nicholas Lose, MD ? ? PT End of Session - 12/19/21 0807   ? ? Visit Number 13   ? Number of Visits 16   ? Date for PT Re-Evaluation 01/01/22   ? PT Start Time [phone]   ? PT Stop Time 0902   ? PT Time Calculation (min) 58 min   ? Activity Tolerance Patient tolerated treatment well   ? Behavior During Therapy [facility] for tasks assessed/performed   ? ?  ?  ? ?  ? ? ? ?Past Medical History:  ?Diagnosis Date  ? Allergy   ? Arthritis   ? CRD (chronic renal disease)   ? stage 3  ? Depression   ? Diabetes mellitus without complication ([place])   ? GERD (gastroesophageal reflux disease)   ? Hyperlipidemia   ? Hypertension   ? LBBB (left bundle branch block)   ? a. diagnosed in 10/2017 - echo showed a preserved EF of 55-60% and NST was low-risk showing no evidence of ischemia.   ? Osteoporosis   ? polyps in colon   ? Thyroid disease   ? Urinary tract infection   ? ?Past Surgical History:  ?Procedure Laterality Date  ? ABDOMINAL HYSTERECTOMY    ? APPENDECTOMY    ? BREAST BIOPSY Right   ? BREAST LUMPECTOMY Right 07/2021  ? BREAST LUMPECTOMY WITH RADIOACTIVE SEED AND SENTINEL LYMPH NODE BIOPSY Right 07/24/2021  ? Procedure: RIGHT BREAST LUMPECTOMY WITH RADIOACTIVE SEED AND SENTINEL LYMPH NODE BIOPSY;  Surgeon: Jovita Kussmaul, MD;  Location: [place];  Service: General;  Laterality: Right;  ? ?Patient Active Problem List  ? Diagnosis Date Noted  ? Chronic tension-type headache, not intractable 07/25/2021  ? Carcinoma of upper-inner quadrant of right breast in female, estrogen receptor positive ([place]) 06/15/2021  ? Diabetic retinopathy ([place]) 11/24/2020  ? Chronic bilateral thoracic back pain 11/21/2018  ? Type 2 diabetes mellitus with nephropathy ([place]) 10/27/2017  ? Tobacco abuse 10/27/2017  ? Nonspecific chest pain   ?  Osteoporosis 05/24/2016  ? CKD (chronic kidney disease) stage 3, GFR 30-59 ml/min (HCC) 05/08/2016  ? Hyperlipidemia associated with type 2 diabetes mellitus ([place]) 01/12/2016  ? Hypothyroidism 01/12/2016  ? Hypertension associated with diabetes ([place]) 01/12/2016  ? Allergic rhinitis 01/12/2016  ? Moderate major depression, single episode ([place]) 01/12/2016  ? Chronic insomnia 01/12/2016  ? GERD (gastroesophageal reflux disease) 01/12/2016  ? ? ?REFERRING DIAG: right breast cancer ? ?THERAPY DIAG:  ?Aftercare following surgery for neoplasm ?  ?Carcinoma of upper-inner quadrant of right breast in female, estrogen receptor positive ([place]) ?  ?Abnormal posture ? ?PERTINENT HISTORY: Pt will be had a Rt lumpectomy with 2/11 lymph nodes positive on 07/24/21.Had infection and drainage just proximal to incision site during radiatio Done with radiation 2/7/20923 . No chemotherapy and now on antiestrogen.   Pt has an increase in SOZO and so is now wearing a sleeve for 4 weeks  other history includes: DM, stage 3 CKD, osteoporosis will be had a Rt lumpectomy with 2/11 lymph nodes positive on 07/24/21.Had infection and drainage just proximal to incision site during radiatio Done with radiation 2/7/20923 . No chemotherapy and now on antiestrogen.   Pt has an increase in SOZO and so is now wearing a sleeve for 4 weeks  other history includes: DM, stage 3 CKD, osteoporosis ? ?  PRECAUTIONS: at risk for lymphedema ? ?SUBJECTIVE: "I think my arm is getting bigger. My sleeve is feeling tighter." ? ?PAIN:  ?Are you having pain? No ? ? ? ?Today's treatment :  ? ?12/19/2021:  ?Manual therapy ?In Supine: Manual lymph drainage as follows: short neck, superficial and deep abdominals, left axillary nodes and anterior interaxillary anastamosis, right  inguinal nodes and axillo-inguinal anastamosis, right shoulder, upper arm, elbow and lower arm and hand with return along pathways  ?MFR: to Rt axilla and into upper arm; also around what may be a  seroma (?scar tissue) near axilla where pt reports she had a seroma drained before ?P/ROM: In Supine to Rt shoulder into flexion, abduction and D2 to pts available end ROM ?12/14/2021: ?Manual therapy ?In Supine: Manual lymph drainage as follows: short neck, superficial and deep abdominals, left axillary nodes and anterior interaxillary anastamosis, right  inguinal nodes and gently to right axilla and axillo-inguinal anastamosis, right shoulder, upper arm, elbow and lower arm with return along pathways having pt return demo. Handout also issued ?MFR: to Rt areas of cording in axilla and into upper arm though this is much less noticeable now than at start of care ?P/ROM: In Supine to Rt shoulder into flexion, abduction and D2 to pts available end ROM ? ?12/12/2021:  ?Manual therapy ?In Supine: Manual lymph drainage as follows: short neck, superficial and deep abdominals, left axillary nodes and anterior interaxillary anastamosis, right  inguinal nodes and gently to right axilla and axillo-inguinal anastamosis, right shoulder, upper arm, elbow and lower arm with return along pathways. ?MFR: to Rt areas of cording in axilla and into upper arm though this is much less noticeable now than at start of care ?P/ROM: In Supine to Rt shoulder into flexion, abduction and D2 to pts available end ROM ? ? ? ?PATIENT EDUCATION: ?Education details:  ? ? ?HOME EXERCISE PROGRAM: ? ? ? ?L-DEX FLOWSHEETS - 11/16/21 0900   ?  ?     ?    12/14/2021  ?  L-DEX LYMPHEDEMA SCREENING   ?  Measurement Type Unilateral    ?  L-DEX MEASUREMENT EXTREMITY Upper Extremity    ?  POSITION  Standing    ?  DOMINANT SIDE Right    ?  At Risk Side Right    ?  BASELINE SCORE (UNILATERAL) -5.9    ?  L-DEX SCORE (UNILATERAL) 14.5  9.9  ?  VALUE CHANGE (UNILAT) 20.4  15.8  ?  Comment pt was issued size 2 medi harmony sleeve with instruction on use due to continued increase in SOZO score.  Did not have matching size gauntlet so gave pt instruction to use the sleeve  only for now and order the gauntlet if the back of the hand starts getting puffy.  Pt will wear this x 4 weeks with remeasure.  Pt was referred back for PT sessions and has an eval soon so we will add MLD and sozo to this    ? ? ?Plan - 11/20/21 0823   ?  ?  Clinical Impression Statement Remeasured pts Rt UE for a Juzo Dynamic and she fits into a size I, max. Will have this ordered with [employer] paying as pt has Medicare. Continued with MLD focusing on areas of fullness at Rt upper arm and forearm and reviewing technique with pt while performing. Also discussed with her being mindful of area where ? Seroma to see if updating doctor about possibly needing it drained again? Will have Shan Levans,  PT also assess this at next session.   ?  Personal Factors and Comorbidities Age;Comorbidity 3+   ?  Comorbidities DM, osteoporosis, CKD   ?  Stability/Clinical Decision Making Stable/Uncomplicated   ?  Clinical Decision Making Low   ?  Rehab Potential Excellent   ?  PT Frequency 2x / week   ?  PT Duration 4 weeks   ?  PT Treatment/Interventions ADLs/Self Care Home Management;Patient/family education;Therapeutic exercise;Manual techniques   ?  PT Next Visit Plan UE/axilla into forearm cording release and PROM, cont and instruct issuing handout MLD of the Rt arm as pt is using sleeve for increased SOZO and education on this.  repeat SOZO around 01/14/22; and continue every 3 month surveillance  ?  PT Home Exercise Plan Access Code: [email or web address] and post op supine   ?  Consulted and Agree with Plan of Care Patient   ?  ? ?Otelia Limes, PTA ?12/19/2021, 9:07 AM ? ?  ? ?

## 2021-12-20 DIAGNOSIS — E113293 Type 2 diabetes mellitus with mild nonproliferative diabetic retinopathy without macular edema, bilateral: Secondary | ICD-10-CM | POA: Diagnosis not present

## 2021-12-20 DIAGNOSIS — H35033 Hypertensive retinopathy, bilateral: Secondary | ICD-10-CM | POA: Diagnosis not present

## 2021-12-20 DIAGNOSIS — H353131 Nonexudative age-related macular degeneration, bilateral, early dry stage: Secondary | ICD-10-CM | POA: Diagnosis not present

## 2021-12-20 DIAGNOSIS — H2513 Age-related nuclear cataract, bilateral: Secondary | ICD-10-CM | POA: Diagnosis not present

## 2021-12-20 LAB — HM DIABETES EYE EXAM

## 2021-12-20 NOTE — Therapy (Signed)
?OUTPATIENT PHYSICAL THERAPY TREATMENT NOTE ? ? ?Patient Name: April Ross ?MRN: [id number] ?DOB:[birth year], 71 y.o., female ?Today's Date: 12/21/2021 ? ?PCP: Jinny Sanders, MD ?REFERRING PROVIDER: Jovita Kussmaul, MD ? ? PT End of Session - 12/21/21 0854   ? ? Visit Number 14   ? Number of Visits 16   ? Date for PT Re-Evaluation 01/01/22   ? PT Start Time 0900   ? Activity Tolerance Patient tolerated treatment well   ? Behavior During Therapy [facility] for tasks assessed/performed   ? ?  ?  ? ?  ? ? ? ? ?Past Medical History:  ?Diagnosis Date  ? Allergy   ? Arthritis   ? CRD (chronic renal disease)   ? stage 3  ? Depression   ? Diabetes mellitus without complication ([place])   ? GERD (gastroesophageal reflux disease)   ? Hyperlipidemia   ? Hypertension   ? LBBB (left bundle branch block)   ? a. diagnosed in 10/2017 - echo showed a preserved EF of 55-60% and NST was low-risk showing no evidence of ischemia.   ? Osteoporosis   ? polyps in colon   ? Thyroid disease   ? Urinary tract infection   ? ?Past Surgical History:  ?Procedure Laterality Date  ? ABDOMINAL HYSTERECTOMY    ? APPENDECTOMY    ? BREAST BIOPSY Right   ? BREAST LUMPECTOMY Right 07/2021  ? BREAST LUMPECTOMY WITH RADIOACTIVE SEED AND SENTINEL LYMPH NODE BIOPSY Right 07/24/2021  ? Procedure: RIGHT BREAST LUMPECTOMY WITH RADIOACTIVE SEED AND SENTINEL LYMPH NODE BIOPSY;  Surgeon: Jovita Kussmaul, MD;  Location: [place];  Service: General;  Laterality: Right;  ? ?Patient Active Problem List  ? Diagnosis Date Noted  ? Chronic tension-type headache, not intractable 07/25/2021  ? Carcinoma of upper-inner quadrant of right breast in female, estrogen receptor positive ([place]) 06/15/2021  ? Diabetic retinopathy ([place]) 11/24/2020  ? Chronic bilateral thoracic back pain 11/21/2018  ? Type 2 diabetes mellitus with nephropathy ([place]) 10/27/2017  ? Tobacco abuse 10/27/2017  ? Nonspecific chest pain   ? Osteoporosis 05/24/2016  ? CKD (chronic kidney disease) stage  3, GFR 30-59 ml/min (HCC) 05/08/2016  ? Hyperlipidemia associated with type 2 diabetes mellitus ([place]) 01/12/2016  ? Hypothyroidism 01/12/2016  ? Hypertension associated with diabetes ([place]) 01/12/2016  ? Allergic rhinitis 01/12/2016  ? Moderate major depression, single episode ([place]) 01/12/2016  ? Chronic insomnia 01/12/2016  ? GERD (gastroesophageal reflux disease) 01/12/2016  ? ? ?REFERRING DIAG: right breast cancer ? ?THERAPY DIAG:  ?Aftercare following surgery for neoplasm ?  ?Carcinoma of upper-inner quadrant of right breast in female, estrogen receptor positive ([place]) ?  ?Abnormal posture ? ?PERTINENT HISTORY: Pt will be had a Rt lumpectomy with 2/11 lymph nodes positive on 07/24/21.Had infection and drainage just proximal to incision site during radiatio Done with radiation 2/7/20923 . No chemotherapy and now on antiestrogen.   Pt has an increase in SOZO and so is now wearing a sleeve for 4 weeks  other history includes: DM, stage 3 CKD, osteoporosis  ? ?PRECAUTIONS: at risk for lymphedema ? ?SUBJECTIVE: I can never tell that anything is wrong ? ?PAIN:  ?Are you having pain? No ? ? ? ?Today's treatment :  ?12/21/21 ?Manual therapy ?Remeasured for new sleeve but after discussing with PTA Mateo Flow we decided that since SOZO is continuing to trend downward we would stay with harmony sleeve.  Pt had initially wanted a sleeve due to discomfort but it is improving.  Discussed washing as pt has not yet washed and dried.   ?In Supine: Manual lymph drainage as follows: short neck, superficial and deep abdominals, bil axillary nodes and anterior interaxillary anastamosis, right  inguinal nodes and axillo-inguinal anastamosis, right shoulder, upper arm, elbow and lower arm and hand with return along pathways  ?MFR: to Rt axilla and into upper arm; also around what may be a seroma ?P/ROM: In Supine to Rt shoulder into flexion, abduction and D2 to pts available end ROM ? ? ?12/19/2021:  ?Manual therapy ?In Supine: Manual lymph  drainage as follows: short neck, superficial and deep abdominals, left axillary nodes and anterior interaxillary anastamosis, right  inguinal nodes and axillo-inguinal anastamosis, right shoulder, upper arm, elbow and lower arm and hand with return along pathways  ?MFR: to Rt axilla and into upper arm; also around what may be a seroma (?scar tissue) near axilla where pt reports she had a seroma drained before ?P/ROM: In Supine to Rt shoulder into flexion, abduction and D2 to pts available end ROM ? ?12/14/2021: ?Manual therapy ?In Supine: Manual lymph drainage as follows: short neck, superficial and deep abdominals, left axillary nodes and anterior interaxillary anastamosis, right  inguinal nodes and gently to right axilla and axillo-inguinal anastamosis, right shoulder, upper arm, elbow and lower arm with return along pathways having pt return demo. Handout also issued ?MFR: to Rt areas of cording in axilla and into upper arm though this is much less noticeable now than at start of care ?P/ROM: In Supine to Rt shoulder into flexion, abduction and D2 to pts available end ROM ? ? ? ?PATIENT EDUCATION: ?Education details:  ? ? ?HOME EXERCISE PROGRAM: ? ?Plan - 11/20/21 0823   ?  ?  Clinical Impression Statement Decided to stick with medi harmony as pt reports it is itchy sometimes but not too bad and also SOZO is trending downward.     ?  Personal Factors and Comorbidities Age;Comorbidity 3+   ?  Comorbidities DM, osteoporosis, CKD   ?  Stability/Clinical Decision Making Stable/Uncomplicated   ?  Clinical Decision Making Low   ?  Rehab Potential Excellent   ?  PT Frequency 2x / week   ?  PT Duration 4 weeks   ?  PT Treatment/Interventions ADLs/Self Care Home Management;Patient/family education;Therapeutic exercise;Manual techniques   ?  PT Next Visit Plan MLD of the Rt arm as pt is using sleeve for increased SOZO. repeat SOZO on last visit and continue every 3 month surveillance  ?  PT Home Exercise Plan Access Code:  [email or web address] and post op supine   ?  Consulted and Agree with Plan of Care Patient   ?  ? ?Stark Bray, PT ?12/21/2021, 8:55 AM ? ?  ? ?

## 2021-12-21 ENCOUNTER — Encounter: Payer: Self-pay | Admitting: Rehabilitation

## 2021-12-21 ENCOUNTER — Other Ambulatory Visit: Payer: Self-pay

## 2021-12-21 ENCOUNTER — Ambulatory Visit: Payer: PPO | Admitting: Rehabilitation

## 2021-12-21 DIAGNOSIS — Z483 Aftercare following surgery for neoplasm: Secondary | ICD-10-CM

## 2021-12-21 DIAGNOSIS — R293 Abnormal posture: Secondary | ICD-10-CM

## 2021-12-21 DIAGNOSIS — C50211 Malignant neoplasm of upper-inner quadrant of right female breast: Secondary | ICD-10-CM

## 2021-12-22 ENCOUNTER — Encounter: Payer: Self-pay | Admitting: Family Medicine

## 2021-12-26 ENCOUNTER — Ambulatory Visit: Payer: PPO | Admitting: Rehabilitation

## 2021-12-26 ENCOUNTER — Encounter: Payer: Self-pay | Admitting: Rehabilitation

## 2021-12-26 ENCOUNTER — Other Ambulatory Visit: Payer: Self-pay

## 2021-12-26 DIAGNOSIS — C50211 Malignant neoplasm of upper-inner quadrant of right female breast: Secondary | ICD-10-CM

## 2021-12-26 DIAGNOSIS — Z483 Aftercare following surgery for neoplasm: Secondary | ICD-10-CM | POA: Diagnosis not present

## 2021-12-26 NOTE — Therapy (Signed)
?OUTPATIENT PHYSICAL THERAPY TREATMENT NOTE ? ? ?Patient Name: Haide Klinker Bissonette ?MRN: [id number] ?DOB:11-05-1950, 71 y.o., female ?Today's Date: 12/26/2021 ? ?PCP: Jinny Sanders, MD ?REFERRING PROVIDER: Jovita Kussmaul, MD ? ? PT End of Session - 12/26/21 0803   ? ? Visit Number 15   ? Number of Visits 16   ? Date for PT Re-Evaluation 01/01/22   ? PT Start Time [phone]   ? PT Stop Time 0848   ? PT Time Calculation (min) 44 min   ? Activity Tolerance Patient tolerated treatment well   ? Behavior During Therapy [facility] for tasks assessed/performed   ? ?  ?  ? ?  ? ? ? ? ?Past Medical History:  ?Diagnosis Date  ? Allergy   ? Arthritis   ? CRD (chronic renal disease)   ? stage 3  ? Depression   ? Diabetes mellitus without complication ([place])   ? GERD (gastroesophageal reflux disease)   ? Hyperlipidemia   ? Hypertension   ? LBBB (left bundle branch block)   ? a. diagnosed in 10/2017 - echo showed a preserved EF of 55-60% and NST was low-risk showing no evidence of ischemia.   ? Osteoporosis   ? polyps in colon   ? Thyroid disease   ? Urinary tract infection   ? ?Past Surgical History:  ?Procedure Laterality Date  ? ABDOMINAL HYSTERECTOMY    ? APPENDECTOMY    ? BREAST BIOPSY Right   ? BREAST LUMPECTOMY Right 07/2021  ? BREAST LUMPECTOMY WITH RADIOACTIVE SEED AND SENTINEL LYMPH NODE BIOPSY Right 07/24/2021  ? Procedure: RIGHT BREAST LUMPECTOMY WITH RADIOACTIVE SEED AND SENTINEL LYMPH NODE BIOPSY;  Surgeon: Jovita Kussmaul, MD;  Location: [place];  Service: General;  Laterality: Right;  ? ?Patient Active Problem List  ? Diagnosis Date Noted  ? Chronic tension-type headache, not intractable 07/25/2021  ? Carcinoma of upper-inner quadrant of right breast in female, estrogen receptor positive ([place]) 06/15/2021  ? Diabetic retinopathy ([place]) 11/24/2020  ? Chronic bilateral thoracic back pain 11/21/2018  ? Type 2 diabetes mellitus with nephropathy ([place]) 10/27/2017  ? Tobacco abuse 10/27/2017  ? Nonspecific chest pain   ?  Osteoporosis 05/24/2016  ? CKD (chronic kidney disease) stage 3, GFR 30-59 ml/min (HCC) 05/08/2016  ? Hyperlipidemia associated with type 2 diabetes mellitus ([place]) 01/12/2016  ? Hypothyroidism 01/12/2016  ? Hypertension associated with diabetes ([place]) 01/12/2016  ? Allergic rhinitis 01/12/2016  ? Moderate major depression, single episode ([place]) 01/12/2016  ? Chronic insomnia 01/12/2016  ? GERD (gastroesophageal reflux disease) 01/12/2016  ? ? ?REFERRING DIAG: right breast cancer ? ?THERAPY DIAG:  ?Aftercare following surgery for neoplasm ?  ?Carcinoma of upper-inner quadrant of right breast in female, estrogen receptor positive ([place]) ?  ?Abnormal posture ? ?PERTINENT HISTORY: Pt will be had a Rt lumpectomy with 2/11 lymph nodes positive on 07/24/21.Had infection and drainage just proximal to incision site during radiatio Done with radiation 2/7/20923 . No chemotherapy and now on antiestrogen.   Pt has an increase in SOZO and so is now wearing a sleeve for 4 weeks  other history includes: DM, stage 3 CKD, osteoporosis  ? ?PRECAUTIONS: at risk for lymphedema ? ?SUBJECTIVE: I can never tell that anything is wrong - just tired ? ?PAIN:  ?Are you having pain? No ? ? ? ?Today's treatment :  ? ?12/26/21 ?Manual therapy ?In Supine: Manual lymph drainage as follows: short neck, superficial and deep abdominals, bil axillary nodes and anterior interaxillary anastamosis,  right  inguinal nodes and axillo-inguinal anastamosis, right shoulder, upper arm, elbow and lower arm and hand with return along pathways  ?MFR: to Rt axilla and into upper arm; also around what may be a seroma ?P/ROM: In Supine to Rt shoulder into flexion, abduction and D2 to pts available end ROM ?Scar mobility work noticing a small harder lump with some redness above the lumpectomy incision which pt notes has been like that for awhile but not always.  Did not soften much during scar mobilization so pt was instructed on watching this for the next few days and  letting the MD know ? ?12/21/21 ?Manual therapy ?Remeasured for new sleeve but after discussing with PTA Mateo Flow we decided that since SOZO is continuing to trend downward we would stay with harmony sleeve.  Pt had initially wanted a sleeve due to discomfort but it is improving.  Discussed washing as pt has not yet washed and dried.   ?In Supine: Manual lymph drainage as follows: short neck, superficial and deep abdominals, bil axillary nodes and anterior interaxillary anastamosis, right  inguinal nodes and axillo-inguinal anastamosis, right shoulder, upper arm, elbow and lower arm and hand with return along pathways  ?MFR: to Rt axilla and into upper arm; also around what may be a seroma ?P/ROM: In Supine to Rt shoulder into flexion, abduction and D2 to pts available end ROM ? ? ?12/19/2021:  ?Manual therapy ?In Supine: Manual lymph drainage as follows: short neck, superficial and deep abdominals, left axillary nodes and anterior interaxillary anastamosis, right  inguinal nodes and axillo-inguinal anastamosis, right shoulder, upper arm, elbow and lower arm and hand with return along pathways  ?MFR: to Rt axilla and into upper arm; also around what may be a seroma (?scar tissue) near axilla where pt reports she had a seroma drained before ?P/ROM: In Supine to Rt shoulder into flexion, abduction and D2 to pts available end ROM ? ? ?PATIENT EDUCATION: ?Education details:  ? ? ?HOME EXERCISE PROGRAM: ? ?Plan - 11/20/21 0823   ?  ?  Clinical Impression Statement Continued MLD and Rt axillary region decongestion.  Monitor small hard place superior lump incision with some redness.    ?  Personal Factors and Comorbidities Age;Comorbidity 3+   ?  Comorbidities DM, osteoporosis, CKD   ?  Stability/Clinical Decision Making Stable/Uncomplicated   ?  Clinical Decision Making Low   ?  Rehab Potential Excellent   ?  PT Frequency 2x / week   ?  PT Duration 4 weeks   ?  PT Treatment/Interventions ADLs/Self Care Home  Management;Patient/family education;Therapeutic exercise;Manual techniques   ?  PT Next Visit Plan *DC visit - repeat SOZO* schedule out as indicated. MLD of the Rt arm as pt is using sleeve for increased SOZO. repeat SOZO on last visit and continue every 3 month surveillance  ?  PT Home Exercise Plan Access Code: [email or web address] and post op supine   ?  Consulted and Agree with Plan of Care Patient   ?  ? ?Stark Bray, PT ?12/26/2021, 8:45 AM ? ?  ? ?

## 2021-12-28 ENCOUNTER — Encounter: Payer: Self-pay | Admitting: Rehabilitation

## 2021-12-28 ENCOUNTER — Ambulatory Visit: Payer: PPO | Admitting: Rehabilitation

## 2021-12-28 ENCOUNTER — Other Ambulatory Visit: Payer: Self-pay

## 2021-12-28 DIAGNOSIS — C50211 Malignant neoplasm of upper-inner quadrant of right female breast: Secondary | ICD-10-CM

## 2021-12-28 DIAGNOSIS — Z483 Aftercare following surgery for neoplasm: Secondary | ICD-10-CM | POA: Diagnosis not present

## 2021-12-28 DIAGNOSIS — R293 Abnormal posture: Secondary | ICD-10-CM

## 2021-12-28 NOTE — Therapy (Signed)
?OUTPATIENT PHYSICAL THERAPY TREATMENT NOTE ? ? ?Patient Name: April Ross ?MRN: [id number] ?DOB:[birth year], 71 y.o., female ?Today's Date: 12/28/2021 ? ?PCP: Jinny Sanders, MD ?REFERRING PROVIDER: Jovita Kussmaul, MD ? ? PT End of Session - 12/28/21 0800   ? ? Visit Number 16   ? Number of Visits 16   ? Date for PT Re-Evaluation 01/01/22   ? PT Start Time 0801   ? PT Stop Time [phone]   ? PT Time Calculation (min) 50 min   ? Activity Tolerance Patient tolerated treatment well   ? Behavior During Therapy [facility] for tasks assessed/performed   ? ?  ?  ? ?  ? ? ? ? ?Past Medical History:  ?Diagnosis Date  ? Allergy   ? Arthritis   ? CRD (chronic renal disease)   ? stage 3  ? Depression   ? Diabetes mellitus without complication ([place])   ? GERD (gastroesophageal reflux disease)   ? Hyperlipidemia   ? Hypertension   ? LBBB (left bundle branch block)   ? a. diagnosed in 10/2017 - echo showed a preserved EF of 55-60% and NST was low-risk showing no evidence of ischemia.   ? Osteoporosis   ? polyps in colon   ? Thyroid disease   ? Urinary tract infection   ? ?Past Surgical History:  ?Procedure Laterality Date  ? ABDOMINAL HYSTERECTOMY    ? APPENDECTOMY    ? BREAST BIOPSY Right   ? BREAST LUMPECTOMY Right 07/2021  ? BREAST LUMPECTOMY WITH RADIOACTIVE SEED AND SENTINEL LYMPH NODE BIOPSY Right 07/24/2021  ? Procedure: RIGHT BREAST LUMPECTOMY WITH RADIOACTIVE SEED AND SENTINEL LYMPH NODE BIOPSY;  Surgeon: Jovita Kussmaul, MD;  Location: [place];  Service: General;  Laterality: Right;  ? ?Patient Active Problem List  ? Diagnosis Date Noted  ? Chronic tension-type headache, not intractable 07/25/2021  ? Carcinoma of upper-inner quadrant of right breast in female, estrogen receptor positive ([place]) 06/15/2021  ? Diabetic retinopathy ([place]) 11/24/2020  ? Chronic bilateral thoracic back pain 11/21/2018  ? Type 2 diabetes mellitus with nephropathy ([place]) 10/27/2017  ? Tobacco abuse 10/27/2017  ? Nonspecific chest pain   ?  Osteoporosis 05/24/2016  ? CKD (chronic kidney disease) stage 3, GFR 30-59 ml/min (HCC) 05/08/2016  ? Hyperlipidemia associated with type 2 diabetes mellitus ([place]) 01/12/2016  ? Hypothyroidism 01/12/2016  ? Hypertension associated with diabetes ([place]) 01/12/2016  ? Allergic rhinitis 01/12/2016  ? Moderate major depression, single episode ([place]) 01/12/2016  ? Chronic insomnia 01/12/2016  ? GERD (gastroesophageal reflux disease) 01/12/2016  ? ? ?REFERRING DIAG: right breast cancer ? ?THERAPY DIAG:  ?Aftercare following surgery for neoplasm ?  ?Carcinoma of upper-inner quadrant of right breast in female, estrogen receptor positive ([place]) ?  ?Abnormal posture ? ?PERTINENT HISTORY: Pt will be had a Rt lumpectomy with 2/11 lymph nodes positive on 07/24/21.Had infection and drainage just proximal to incision site during radiatio Done with radiation 2/7/20923 . No chemotherapy and now on antiestrogen.   Pt has an increase in SOZO and so is now wearing a sleeve for 4 weeks  other history includes: DM, stage 3 CKD, osteoporosis  ? ?PRECAUTIONS: at risk for lymphedema ? ?SUBJECTIVE:  ? ? ?PAIN:  ?Are you having pain? No ? ? ? ?Today's treatment :  ? ?12/28/21 ?Repeat SOZO:  Continues to stay elevated.  Ordered juzo dynamic size 41mx sleeve and size small gauntlet to increase containment.  Discussed how pt is doing everything possible to keep  lymphedema at [place] and that it may still not progress to chronic.   ?MT: Reviewed self MLD in seated with pt doing all steps very well.  No vcs for modification needed ?Then PT performed in supine: short neck, superficial and deep abdominals, bil axillary nodes and anterior interaxillary anastamosis, right  inguinal nodes and axillo-inguinal anastamosis, right shoulder, upper arm, elbow and lower arm and hand with return along pathways  ? ?12/26/21 ?Manual therapy ?In Supine: Manual lymph drainage as follows: short neck, superficial and deep abdominals, bil axillary nodes and anterior  interaxillary anastamosis, right  inguinal nodes and axillo-inguinal anastamosis, right shoulder, upper arm, elbow and lower arm and hand with return along pathways  ?MFR: to Rt axilla and into upper arm; also around what may be a seroma ?P/ROM: In Supine to Rt shoulder into flexion, abduction and D2 to pts available end ROM ?Scar mobility work noticing a small harder lump with some redness above the lumpectomy incision which pt notes has been like that for awhile but not always.  Did not soften much during scar mobilization so pt was instructed on watching this for the next few days and letting the MD know ? ?12/21/21 ?Manual therapy ?Remeasured for new sleeve but after discussing with PTA Mateo Flow we decided that since SOZO is continuing to trend downward we would stay with harmony sleeve.  Pt had initially wanted a sleeve due to discomfort but it is improving.  Discussed washing as pt has not yet washed and dried.   ?In Supine: Manual lymph drainage as follows: short neck, superficial and deep abdominals, bil axillary nodes and anterior interaxillary anastamosis, right  inguinal nodes and axillo-inguinal anastamosis, right shoulder, upper arm, elbow and lower arm and hand with return along pathways  ?MFR: to Rt axilla and into upper arm; also around what may be a seroma ?P/ROM: In Supine to Rt shoulder into flexion, abduction and D2 to pts available end ROM ? ?PATIENT EDUCATION: ?Education details:  ? ? ?HOME EXERCISE PROGRAM: ? ?Plan - 11/20/21 0823   ?  ?  Clinical Impression Statement Continued MLD and Rt axillary region decongestion.  No more redness at the incision more of a brown like her other post radiation skin although the incisions are still firm.  Pt is very ind with self MLD and will switch to her juzo dynamic sleeve with recheck of SOZO in 1 month.    ?  Personal Factors and Comorbidities Age;Comorbidity 3+   ?  Comorbidities DM, osteoporosis, CKD   ?  Stability/Clinical Decision Making  Stable/Uncomplicated   ?  Clinical Decision Making Low   ?  Rehab Potential Excellent   ?  PT Frequency 2x / week   ?  PT Duration 4 weeks   ?  PT Treatment/Interventions ADLs/Self Care Home Management;Patient/family education;Therapeutic exercise;Manual techniques   ?  PT Next Visit Plan *DC visit - repeat SOZO* schedule out as indicated. MLD of the Rt arm as pt is using sleeve for increased SOZO. repeat SOZO on last visit and continue every 3 month surveillance  ?  PT Home Exercise Plan Access Code: [email or web address] and post op supine   ?  Consulted and Agree with Plan of Care Patient   ?  ? ?Stark Bray, PT ?12/28/2021, 11:27 AM ? ?  ? ?

## 2022-01-10 ENCOUNTER — Encounter: Payer: Self-pay | Admitting: Family Medicine

## 2022-01-10 DIAGNOSIS — E1121 Type 2 diabetes mellitus with diabetic nephropathy: Secondary | ICD-10-CM

## 2022-01-10 MED ORDER — ONETOUCH VERIO VI STRP: ORAL_STRIP | 5 refills | Status: DC

## 2022-01-10 MED ORDER — ONETOUCH VERIO VI STRP: ORAL_STRIP | 5 refills | Status: AC

## 2022-01-10 MED ORDER — ONETOUCH DELICA PLUS LANCET30G MISC: 5 refills | Status: DC

## 2022-01-10 MED ORDER — ONETOUCH VERIO REFLECT W/DEVICE KIT: PACK | 0 refills | Status: AC

## 2022-01-10 MED ORDER — ONETOUCH DELICA PLUS LANCET30G MISC: 5 refills | Status: AC

## 2022-01-10 MED ORDER — BD SWAB SINGLE USE REGULAR PADS: MEDICATED_PAD | 5 refills | Status: DC

## 2022-01-10 MED ORDER — ONETOUCH VERIO REFLECT W/DEVICE KIT: PACK | 0 refills | Status: DC

## 2022-01-10 NOTE — Addendum Note (Signed)
Addended by: Carter Kitten on: 01/10/2022 02:36 PM ? ? Modules accepted: Orders ? ?

## 2022-01-10 NOTE — Telephone Encounter (Signed)
Diabetic  testing supplies sent to [facility] in [place] as requested.  ?

## 2022-01-11 ENCOUNTER — Telehealth: Payer: Self-pay | Admitting: Family Medicine

## 2022-01-11 ENCOUNTER — Other Ambulatory Visit: Payer: Self-pay | Admitting: Family Medicine

## 2022-01-11 ENCOUNTER — Telehealth: Payer: Self-pay | Admitting: Pharmacist

## 2022-01-11 DIAGNOSIS — E1121 Type 2 diabetes mellitus with diabetic nephropathy: Secondary | ICD-10-CM

## 2022-01-11 DIAGNOSIS — E785 Hyperlipidemia, unspecified: Secondary | ICD-10-CM

## 2022-01-11 NOTE — Progress Notes (Signed)
?  Chronic Care Management  ? ?Note ? ?01/11/2022 ?Name: April Ross MRN: [id number] DOB: 1950/12/27 ? ?April Ross is a 71 y.o. year old female who is a primary care patient of Bedsole, Amy E, MD. I reached out to [place] by phone today in response to a referral sent by April Ross's PCP, Jinny Sanders, MD.  ? ?April Ross was given information about Chronic Care Management services today including:  ?CCM service includes personalized support from designated clinical staff supervised by her physician, including individualized plan of care and coordination with other care providers ?24/7 contact phone numbers for assistance for urgent and routine care needs. ?Service will only be billed when office clinical staff spend 20 minutes or more in a month to coordinate care. ?Only one practitioner may furnish and bill the service in a calendar month. ?The patient may stop CCM services at any time (effective at the end of the month) by phone call to the office staff. ? ? ?Patient agreed to services and verbal consent obtained.  ? ?Follow up plan: ?Referral/ no copay ? ?Tatjana Dellinger ?Upstream Scheduler  ?

## 2022-01-11 NOTE — Progress Notes (Signed)
?  Chronic Care Management  ? ?Outreach Note ? ?01/11/2022 ?Name: April Ross MRN: [id number] DOB: [birth year] ? ?Referred by: Jinny Sanders, MD ?Reason for referral : No chief complaint on file. ? ? ?An unsuccessful telephone outreach was attempted today. The patient was referred to the pharmacist for assistance with care management and care coordination.  ? ?Follow Up Plan:  ?PT NEEDED DIFFERENT TIMES DUE TO TAKEN CARE OF HER UNCLE ? ?Tatjana Dellinger ?Upstream Scheduler  ?

## 2022-01-11 NOTE — Telephone Encounter (Signed)
Received request for referral to CCM for help with managing diabetes, particularly cost barriers. Entered order and informed scheduler. ? ? ?

## 2022-01-12 ENCOUNTER — Telehealth: Payer: Self-pay

## 2022-01-12 NOTE — Chronic Care Management (AMB) (Signed)
? ? ?Chronic Care Management ?Pharmacy Assistant  ? ?Name: April Ross  MRN: [id number] DOB: Feb 07, 1951 ? ?April Ross is an 71 y.o. year old female who presents for his initial CCM visit with the clinical pharmacist. ? ?Reason for Encounter: Initial Questions  ?  ?Conditions to be addressed/monitored: ?HTN, HLD, and DMII ? ?Recent office visits:  ?12/15/21-PCP- [name],MD- AWV.Discussed screenings,vaccines, counseled on smoking cessation-Follow BP at home. call if > 140/90. ? ?Recent consult visits:  ?12/14/21- Physical Therapy- multiple visits -Right breast cancer ?11/28/21-[facility]- post op right breast lumpectomy and biopsy.No medication changes  ?11/07/21-[facility]- follow up after radiation-continue oral antibiotics for cellulitis right axilla. ?11/06/21-Oncology-Vinay Gudena,MD-Follow up breast cancer right.discussed treatment plan-radiation ?11/01/21-[facility],MD- follow up breast cancer, continue oral antibiotics  and monitor ?08/25/21-Oncology-Govinda Brahmanday,MD-Follow up breast cancer pathology and treatment plan. ?07/24/21-[facility]-Paul Daiva Nakayama, MD- Right breast lumpectomy-start Tramadol 79m take 1 tablet every 6 hours for pain  ? ?Hospital visits:  ?None in previous 6 months ? ?Medications: ?Outpatient Encounter Medications as of 01/12/2022  ?Medication Sig  ? Alcohol Swabs (B-D SINGLE USE SWABS REGULAR) PADS Use to check blood sugar up to 2 times a day  ? amLODipine (NORVASC) 5 MG tablet TAKE 1 TABLET (5 MG TOTAL) BY MOUTH DAILY.  ? Blood Glucose Monitoring Suppl (ONETOUCH VERIO REFLECT) w/Device KIT Use to check blood sugar up to 2 times a day  ? cetirizine (ZYRTEC) 10 MG tablet Take 10 mg by mouth at bedtime as needed for allergies.  ? CINNAMON PO Take 1 tablet by mouth daily.  ? fluticasone (FLONASE) 50 MCG/ACT nasal spray Place 2 sprays into both nostrils daily as needed for allergies.  ? gabapentin (NEURONTIN) 100 MG capsule Take 100 mg  by mouth 3 (three) times daily as needed.  ? glucose blood (ONETOUCH VERIO) test strip Use to check blood sugar up to 2 times a day  ? glyBURIDE (DIABETA) 5 MG tablet Take 2 tablets (10 mg total) by mouth 2 (two) times daily with a meal.  ? Lancets (ONETOUCH DELICA PLUS LYDXAJO87O MISC Use to check blood sugar up to 2 times a day  ? letrozole (FEMARA) 2.5 MG tablet Take 1 tablet (2.5 mg total) by mouth daily.  ? levothyroxine (SYNTHROID) 88 MCG tablet TAKE 1 TABLET BY MOUTH EVERY DAY  ? losartan (COZAAR) 100 MG tablet TAKE 1 TABLET BY MOUTH EVERY DAY  ? Omega-3 Fatty Acids (FISH OIL OMEGA-3 PO) Take 1 capsule by mouth daily.  ? rosuvastatin (CRESTOR) 20 MG tablet TAKE 1 TABLET BY MOUTH EVERY DAY  ? tiZANidine (ZANAFLEX) 4 MG tablet TAKE 1 TABLET BY MOUTH AT BEDTIME AS NEEDED FOR MUSCLE SPASMS.  ? traMADol (ULTRAM) 50 MG tablet Take 1 tablet (50 mg total) by mouth every 6 (six) hours as needed.  ? Turmeric (QC TUMERIC COMPLEX PO) Take 1 tablet by mouth daily.  ? VITAMIN D PO Take 2 each by mouth daily.  ? ?No facility-administered encounter medications on file as of 01/12/2022.  ? ? ?Lab Results  ?Component Value Date/Time  ? HGBA1C 7.8 (H) 12/08/2021 07:30 AM  ? HGBA1C 6.4 06/09/2021 08:03 AM  ? MICROALBUR 5.0 (H) 11/19/2017 10:18 AM  ?  ? ?BP Readings from Last 3 Encounters:  ?12/15/21 (!) 160/80  ?11/06/21 131/67  ?11/01/21 116/66  ? ? ? ? ?Patient contacted to confirm in office appointment with LCharlene Brooke Pharm D, on 01/15/22 at 8:30am. Face to Face appointment ? ?Do you  have any problems getting your medications? No ? ?What is your top health concern you would like to discuss at your upcoming visit? Effective management with diabetes  ? ?Have you seen any other providers since your last visit with PCP? Yes Physical Therapy  ? ? ? ?Star Rating Drugs:  ?Medication:  Last Fill: Day Supply ?Glyburide 62m  12/24/21 90 ?Losartan 1072m2/9/23  90 ? ? ? ?Care Gaps: ?Annual wellness visit in last year? Yes ?Most  Recent BP reading:160/80  80-P  12/15/21 ? ?If Diabetic: ?Most recent A1C reading:7.8 ?Last eye exam / retinopathy screening:UTD  ?Last diabetic foot exam: UTD ? ?LiCharlene BrookeCPP notified ? ?Lakeeta Dobosz, CCMA ?Health concierge  ?[phone]?

## 2022-01-15 ENCOUNTER — Ambulatory Visit (INDEPENDENT_AMBULATORY_CARE_PROVIDER_SITE_OTHER): Payer: PPO | Admitting: Pharmacist

## 2022-01-15 ENCOUNTER — Telehealth: Payer: Self-pay | Admitting: Pharmacist

## 2022-01-15 DIAGNOSIS — E1121 Type 2 diabetes mellitus with diabetic nephropathy: Secondary | ICD-10-CM

## 2022-01-15 DIAGNOSIS — E1159 Type 2 diabetes mellitus with other circulatory complications: Secondary | ICD-10-CM

## 2022-01-15 DIAGNOSIS — E1169 Type 2 diabetes mellitus with other specified complication: Secondary | ICD-10-CM

## 2022-01-15 MED ORDER — FREESTYLE LIBRE 2 SENSOR MISC: 5 refills | Status: DC

## 2022-01-15 NOTE — Progress Notes (Signed)
? ?Chronic Care Management ?Pharmacy Note ? ?01/16/2022 ?Name:  April Ross MRN:  [id number] DOB:  [birth year] ? ?Summary: CCM Initial visit ?-Pt would like help optimizing DM medication. She stopped metformin last year due to CKD and stopped Trulicity due to high cost in donut hole. Unfortunately pt will not qualify for any assistance programs as her income is too high. Discussed how donut hole works, it is likely pt will only have to pay high price once (for 32-monthsupply) before insurance resets. Pt is agreeable to this ?-Glyburide is not recommended with GFR < 60, it is also listed on Beers Criteria to avoid use in > 636year old. Notably, pt has been taking glyburide for as long as GFR has been < 45 and she has not had significant hypoglycemia or side effects. She just refilled 90-day supply of glyburide and would rather finish this if possible. ?-Not discussed today, but per review of pharmacy records pt is not taking rosuvastatin anymore. Pt may benefit from switching to atorvastatin as this is preferred in CKD anyway ? ?Recommendations/Changes made from today's visit: ?-Start Trulicity 07.67mg weekly ?-Start Freestyle LWarthen2 (sent to pharmacy) Advised pt to call to set up appt for training if desired. ?-Change Glyburide to Glipizide 5 mg BID (GFR < 60); ok to wait until she finishes glyburide supply before switching. ?-Consider switching rosuvastatin to atorvastatin - will discuss at f/u visit 1 month. ? ?[place]: ?[place] - Freestyle Libre ?-Pharmacist follow up televisit scheduled for 1 month ?-PCP 03/16/22 ? ? ? ?Subjective: ?April JupinRash is an 71y.o. year old female who is a primary patient of Bedsole, Amy E, MD.  The CCM team was consulted for assistance with disease management and care coordination needs.   ? ?Engaged with patient face to face for initial visit in response to provider referral for pharmacy case management and/or care coordination  services.  ? ?Consent to Services:  ?The patient was given the following information about Chronic Care Management services today, agreed to services, and gave verbal consent: 1. CCM service includes personalized support from designated clinical staff supervised by the primary care provider, including individualized plan of care and coordination with other care providers 2. 24/7 contact phone numbers for assistance for urgent and routine care needs. 3. Service will only be billed when office clinical staff spend 20 minutes or more in a month to coordinate care. 4. Only one practitioner may furnish and bill the service in a calendar month. 5.The patient may stop CCM services at any time (effective at the end of the month) by phone call to the office staff. 6. The patient will be responsible for cost sharing (co-pay) of up to 20% of the service fee (after annual deductible is met). Patient agreed to services and consent obtained. ? ?Patient Care Team: ?BJinny Sanders MD as PCP - General (Family Medicine) ?ARichard Miu DMD as Consulting Physician (Dentistry) ?MRockwell Germany RN as Oncology Nurse Navigator ?SMauro Kaufmann RN as Oncology Nurse Navigator ?GNicholas Lose MD as Consulting Physician (Hematology and Oncology) ?Harlin Mazzoni, LCleaster Corin [facility] Pharmacist (Pharmacist) ? ?Recent office visits: ?12/15/21-PCP- [name],MD- AWV.Discussed screenings,vaccines, counseled on smoking cessation-Follow BP at home. call if > 140/90. ? ?Recent consult visits: ?12/14/21- Physical Therapy- multiple visits -Right breast cancer ?[facility].No medication changes  ?11/07/21-[facility]- follow up after radiation-continue oral antibiotics for cellulitis  right axilla. ?11/06/21-Oncology-Vinay Gudena,MD-Follow up breast cancer right.discussed treatment plan-radiation ?11/01/21-[facility]- follow up breast cancer, continue oral antibiotics  and  monitor ?08/25/21-Oncology-Govinda Brahmanday,MD-Follow up breast cancer pathology and treatment plan. ?07/24/21-[facility]-Paul Daiva Nakayama, MD- Right breast lumpectomy-start Tramadol 69m take 1 tablet every 6 hours for pain  ? ?Hospital visits: ?None in previous 6 months ? ? ?Objective: ? ?Lab Results  ?Component Value Date  ? CREATININE 1.35 (H) 12/08/2021  ? BUN 17 12/08/2021  ? GFR 39.18 (L) 12/08/2021  ? GFRNONAA 38 (L) 07/21/2021  ? GFRAA 37 (L) 04/12/2020  ? NA 139 12/08/2021  ? K 4.0 12/08/2021  ? CALCIUM 9.8 12/08/2021  ? CO2 28 12/08/2021  ? GLUCOSE 160 (H) 12/08/2021  ? ? ?Lab Results  ?Component Value Date/Time  ? HGBA1C 7.8 (H) 12/08/2021 07:30 AM  ? HGBA1C 6.4 06/09/2021 08:03 AM  ? GFR 39.18 (L) 12/08/2021 07:30 AM  ? GFR 32.56 (L) 06/09/2021 08:03 AM  ? MICROALBUR 5.0 (H) 11/19/2017 10:18 AM  ?  ?Last diabetic Eye exam:  ?Lab Results  ?Component Value Date/Time  ? HMDIABEYEEXA Retinopathy (A) 12/20/2021 12:00 AM  ?  ?Last diabetic Foot exam:  ?Lab Results  ?Component Value Date/Time  ? HMDIABFOOTEX done 06/15/2021 12:00 AM  ?  ? ?Lab Results  ?Component Value Date  ? CHOL 153 12/08/2021  ? HDL 30.80 (L) 12/08/2021  ? L[place]61 10/28/2017  ? LDLDIRECT 85.0 12/08/2021  ? TRIG 277.0 (H) 12/08/2021  ? CHOLHDL 5 12/08/2021  ? ? ? ?  Latest Ref Rng & Units 12/08/2021  ?  7:30 AM 06/09/2021  ?  8:03 AM 02/17/2021  ?  7:41 AM  ?Hepatic Function  ?Total Protein 6.0 - 8.3 g/dL 6.5   7.3   7.6    ?Albumin 3.5 - 5.2 g/dL 4.3   4.8   4.9    ?AST 0 - 37 U/L 24   23   34    ?ALT 0 - 35 U/L 25   22   32    ?Alk Phosphatase 39 - 117 U/L 65   48   64    ?Total Bilirubin 0.2 - 1.2 mg/dL 0.5   0.4   0.4    ? ? ?Lab Results  ?Component Value Date/Time  ? TSH 2.37 12/08/2021 07:30 AM  ? TSH 2.89 11/18/2020 08:13 AM  ? FREET4 1.15 12/08/2021 07:30 AM  ? FREET4 1.22 11/18/2020 08:13 AM  ? ? ? ?  Latest Ref Rng & Units 12/01/2020  ?  6:35 PM 09/28/2020  ?  8:31 PM 09/26/2020  ?  8:22 AM  ?CBC  ?WBC 4.0 - 10.5 K/uL  8.5   8.3   6.9    ?Hemoglobin 12.0 - 15.0 g/dL 12.8   11.6   12.2    ?Hematocrit 36.0 - 46.0 % 38.2   36.4   38.1    ?Platelets 150 - 400 K/uL 240   223   212    ? ? ?Lab Results  ?Component Value Date/Time  ? VD25OH 49.99 02/18/2018 08:22 AM  ? VD25OH 46.97 05/09/2017 12:37 PM  ? ? ?Clinical ASCVD: No  ?The 10-year ASCVD risk score (Arnett DK, et al., 2019) is: 55.5% ?  Values used to calculate the score: ?    Age: [age]years ?    Sex: Female ?    Is Non-Hispanic African American: No ?    Diabetic: Yes ?    Tobacco smoker: Yes ?  Systolic Blood Pressure: 593 mmHg ?    Is BP treated: Yes ?    HDL Cholesterol: 30.8 mg/dL ?    Total Cholesterol: 153 mg/dL   ? ? ?  12/15/2021  ?  9:10 AM 06/15/2021  ?  8:55 AM 02/24/2021  ?  9:48 AM  ?Depression screen PHQ 2/9  ?Decreased Interest 2 1 2   ?Down, Depressed, Hopeless 2 1 2   ?PHQ - 2 Score 4 2 4   ?Altered sleeping 3 2 3   ?Tired, decreased energy 2 2 3   ?Change in appetite 0 1 1  ?Feeling bad or failure about yourself  0 1 1  ?Trouble concentrating 0 0 0  ?Moving slowly or fidgety/restless 0 0 0  ?Suicidal thoughts 0 0 0  ?PHQ-9 Score 9 8 12   ?Difficult doing work/chores  Not difficult at all Somewhat difficult  ?  ? ?Social History  ? ?Tobacco Use  ?Smoking Status Every Day  ? Packs/day: 0.50  ? Types: Cigarettes  ?Smokeless Tobacco Never  ? ?BP Readings from Last 3 Encounters:  ?12/15/21 (!) 160/80  ?11/06/21 131/67  ?11/01/21 116/66  ? ?Pulse Readings from Last 3 Encounters:  ?12/15/21 80  ?11/06/21 83  ?11/01/21 70  ? ?Wt Readings from Last 3 Encounters:  ?12/15/21 151 lb 3 oz (68.6 kg)  ?12/14/21 151 lb (68.5 kg)  ?11/06/21 150 lb 4 oz (68.2 kg)  ? ?BMI Readings from Last 3 Encounters:  ?12/15/21 27.43 kg/m?  ?12/14/21 27.62 kg/m?  ?11/06/21 27.48 kg/m?  ? ? ?Assessment/Interventions: Review of patient past medical history, allergies, medications, health status, including review of consultants reports, laboratory and other test data, was performed as part of comprehensive  evaluation and provision of chronic care management services.  ? ?SDOH:  (Social Determinants of Health) assessments and interventions performed: Yes ?SDOH Interventions   ? ?Flowsheet Row Most Recent Valu

## 2022-01-15 NOTE — Telephone Encounter (Signed)
Pt agreed to restart Trulicity 6.30 mg weekly. This is covered by her insurance ($90/3 month supply) but cost will go up during coverage gap. She unfortunately will not qualify for patient assistance due to income too high, but is ok with paying higher cost in donut hole. ? ?Routing to PCP for approval. ?

## 2022-01-16 ENCOUNTER — Encounter: Payer: Self-pay | Admitting: Family Medicine

## 2022-01-16 MED ORDER — TRULICITY 0.75 MG/0.5ML ~~LOC~~ SOAJ: 0.7500 mg | SUBCUTANEOUS | 3 refills | Status: DC

## 2022-01-16 NOTE — Telephone Encounter (Signed)
Yes please restart Trulicity.  Do I need to send in a prescription or will you? ?

## 2022-01-16 NOTE — Patient Instructions (Signed)
Visit Information ? ?Phone number for Pharmacist: [phone] ? ?Thank you for meeting with me to discuss your medications! I look forward to working with you to achieve your health care goals. Below is a summary of what we talked about during the visit: ? ? Goals Addressed   ? ?  ?  ?  ?  ? This Visit's Progress  ?  Monitor and Manage My Blood Sugar-Diabetes Type 2     ?  Timeframe:  Long-Range Goal ?Priority:  Medium ?Start Date:    01/16/22                         ?Expected End Date:    01/17/23                  ? ?Follow Up Date May 2023 ?  ?- check blood sugar at prescribed times ?- take the blood sugar meter to all doctor visits  ?  ?Why is this important?   ?Checking your blood sugar at home helps to keep it from getting very high or very low.  ?Writing the results in a diary or log helps the doctor know how to care for you.  ?Your blood sugar log should have the time, date and the results.  ?Also, write down the amount of insulin or other medicine that you take.  ?Other information, like what you ate, exercise done and how you were feeling, will also be helpful.   ?  ?Notes:  ?  ? ?  ? ? ?Care Plan : [place]  ?Updates made by Charlton Haws, RPH since 01/16/2022 12:00 AM  ?  ? ?Problem: Hypertension, Hyperlipidemia, and Diabetes   ?Priority: High  ?  ? ?Long-Range Goal: Disease mgmt   ?Start Date: 01/16/2022  ?Expected End Date: 01/17/2023  ?This Visit's Progress: On track  ?Priority: High  ?Note:   ?Current Barriers:  ?Suboptimal therapeutic regimen for DM ? ?Pharmacist Clinical Goal(s):  ?Patient will adhere to plan to optimize therapeutic regimen for DM as evidenced by report of adherence to recommended medication management changes through collaboration with PharmD and provider.  ? ?Interventions: ?1:1 collaboration with Jinny Sanders, MD regarding development and update of comprehensive plan of care as evidenced by provider attestation and co-signature ?Inter-disciplinary care team  collaboration (see longitudinal plan of care) ?Comprehensive medication review performed; medication list updated in electronic medical record ? ?Hypertension (BP goal <140/90) ?-Controlled - BP at goal in clinic ?-Current treatment: ?Amlodipine 5 mg daily - Appropriate, Effective, Safe, Accessible ?Losartan 100 mg daily - Appropriate, Effective, Safe, Accessible ?-Medications previously tried: n/a  ?-Educated on BP goals and benefits of medications for prevention of heart attack, stroke and kidney damage; ?-Counseled to monitor BP at home periodically ?-Recommended to continue current medication ? ?Hyperlipidemia: (LDL goal < 100) ?-Controlled - LDL 85 (12/2021) at goal; statin adherence is questionable given pharmacy fill dates, and current Rx is expired as of 01/04/22 ?-Current treatment: ?Rosuvastatin 20 mg daily - not taking ?-Medications previously tried: n/a  ?-Educated on Cholesterol goals; Benefits of statin for ASCVD risk reduction; ?-Recommended to continue current medication; consider changing to atorvastatin for kidney safety ? ?Diabetes (A1c goal <7%) ?-Not ideally controlled - A1c 7.8% (12/2021); pt was taken off metformin due to kidney function last year; she stopped Trulicity due to high cost in donut hole;  ?-Current home glucose readings: not available during visit, pt reports many readings in high 200s ?-  Diet: pt lives with her daughter who prepares most meals and is not consistently following diabetic diet ?-Current medications: ?Glyburide 5 mg - 2 tab BID - Appropriate, Query Effective, Query Safe ?-Medications previously tried: Musician (cost), metformin (SE) ?-Educated on A1c and blood sugar goals; Complications of diabetes including kidney damage, retinal damage, and cardiovascular disease; Continuous glucose monitoring - would like to try [employer] ?-Discussed glyburide is not the best option given her kidney disease - glipizide is preferred for GFR < 60. However, she has been taking  glyburide for over a year without issue and GFR has been fairly stable around 30 the whole time, she has not had significant hypoglycemic issues. ?-Assessed pt finances -based on pt-reported income she will not qualify for any assistance programs; reviewed how donut hole/coverage gap works, she entered this last year in the fall. Discussed going forward Tier 3 (brand) drugs will be around $45 per month until donut hole, she will hopefully only have to pay higher price once before insurance resets in January ?-Recommend restart Trulicity 5.39 mg daily ?-Recommend to start Freestyle Libre 2 - sent to pharmacy. Advised pt to call to set up appt for training if desired. ?-Recommend to change glyburide to glipizide 5 mg BID (glyburide should be avoided with GFR < 60) ? ?Patient Goals/Self-Care Activities ?Patient will:  ?- take medications as prescribed as evidenced by patient report and record review ?focus on medication adherence by routine ?check glucose daily, document, and provide at future appointments ? ?  ?  ? ?Ms. Lortz was given information about Chronic Care Management services today including:  ?CCM service includes personalized support from designated clinical staff supervised by her physician, including individualized plan of care and coordination with other care providers ?24/7 contact phone numbers for assistance for urgent and routine care needs. ?Standard insurance, coinsurance, copays and deductibles apply for chronic care management only during months in which we provide at least 20 minutes of these services. Most insurances cover these services at 100%, however patients may be responsible for any copay, coinsurance and/or deductible if applicable. This service may help you avoid the need for more expensive face-to-face services. ?Only one practitioner may furnish and bill the service in a calendar month. ?The patient may stop CCM services at any time (effective at the end of the month) by phone call  to the office staff. ? ?Patient agreed to services and verbal consent obtained.  ? ?Patient verbalizes understanding of instructions and care plan provided today and agrees to view in [place]. Active MyChart status confirmed with patient.   ?Telephone follow up appointment with pharmacy team member scheduled for: 1 month ? ?Charlene Brooke, PharmD, BCACP ?Clinical Pharmacist ?[facility] ?[phone]  ?

## 2022-01-26 ENCOUNTER — Encounter: Payer: Self-pay | Admitting: Rehabilitation

## 2022-01-30 ENCOUNTER — Encounter: Payer: Self-pay | Admitting: Rehabilitation

## 2022-01-30 ENCOUNTER — Ambulatory Visit: Payer: PPO | Attending: Hematology and Oncology | Admitting: Rehabilitation

## 2022-01-30 DIAGNOSIS — R293 Abnormal posture: Secondary | ICD-10-CM

## 2022-01-30 DIAGNOSIS — Z483 Aftercare following surgery for neoplasm: Secondary | ICD-10-CM

## 2022-01-30 DIAGNOSIS — Z170 Estrogen receptor positive status [ER+]: Secondary | ICD-10-CM | POA: Insufficient documentation

## 2022-01-30 DIAGNOSIS — C50211 Malignant neoplasm of upper-inner quadrant of right female breast: Secondary | ICD-10-CM

## 2022-01-30 NOTE — Therapy (Signed)
?OUTPATIENT PHYSICAL THERAPY [place] SCREENING NOTE ? ? ?Patient Name: April Ross ?MRN: [id number] ?DOB:Jun 07, 1951, 71 y.o., female ?Today's Date: 01/30/2022 ? ?PCP: Jinny Sanders, MD ?REFERRING PROVIDER: Jovita Kussmaul, MD ? ? PT End of Session - 01/30/22 0815   ? ? Visit Number 16   screen only  ? PT Start Time [phone]   ? PT Stop Time 0815   ? PT Time Calculation (min) 17 min   ? Activity Tolerance Patient tolerated treatment well   ? Behavior During Therapy [facility] for tasks assessed/performed   ? ?  ?  ? ?  ? ? ?Past Medical History:  ?Diagnosis Date  ? Allergy   ? Arthritis   ? CRD (chronic renal disease)   ? stage 3  ? Depression   ? Diabetes mellitus without complication ([place])   ? GERD (gastroesophageal reflux disease)   ? Hyperlipidemia   ? Hypertension   ? LBBB (left bundle branch block)   ? a. diagnosed in 10/2017 - echo showed a preserved EF of 55-60% and NST was low-risk showing no evidence of ischemia.   ? Osteoporosis   ? polyps in colon   ? Thyroid disease   ? Urinary tract infection   ? ?Past Surgical History:  ?Procedure Laterality Date  ? ABDOMINAL HYSTERECTOMY    ? APPENDECTOMY    ? BREAST BIOPSY Right   ? BREAST LUMPECTOMY Right 07/2021  ? BREAST LUMPECTOMY WITH RADIOACTIVE SEED AND SENTINEL LYMPH NODE BIOPSY Right 07/24/2021  ? Procedure: RIGHT BREAST LUMPECTOMY WITH RADIOACTIVE SEED AND SENTINEL LYMPH NODE BIOPSY;  Surgeon: Jovita Kussmaul, MD;  Location: [place];  Service: General;  Laterality: Right;  ? ?Patient Active Problem List  ? Diagnosis Date Noted  ? Chronic tension-type headache, not intractable 07/25/2021  ? Carcinoma of upper-inner quadrant of right breast in female, estrogen receptor positive ([place]) 06/15/2021  ? Diabetic retinopathy ([place]) 11/24/2020  ? Chronic bilateral thoracic back pain 11/21/2018  ? Type 2 diabetes mellitus with nephropathy ([place]) 10/27/2017  ? Tobacco abuse 10/27/2017  ? Nonspecific chest pain   ? Osteoporosis 05/24/2016  ? CKD (chronic kidney  disease) stage 3, GFR 30-59 ml/min ([facility]) 05/08/2016  ? Hyperlipidemia associated with type 2 diabetes mellitus ([place]) 01/12/2016  ? Hypothyroidism 01/12/2016  ? Hypertension associated with diabetes ([place]) 01/12/2016  ? Allergic rhinitis 01/12/2016  ? Moderate major depression, single episode ([place]) 01/12/2016  ? Chronic insomnia 01/12/2016  ? GERD (gastroesophageal reflux disease) 01/12/2016  ? ? ?REFERRING DIAG: right breast cancer at risk for lymphedema ? ?THERAPY DIAG:  ?Carcinoma of upper-inner quadrant of right breast in female, estrogen receptor positive ([place]) ? ?Abnormal posture ? ?Aftercare following surgery for neoplasm ? ?PERTINENT HISTORY: Pt will be had a Rt lumpectomy with 2/11 lymph nodes positive on 07/24/21.Had infection and drainage just proximal to incision site during radiatio Done with radiation 2/7/20923 . No chemotherapy and now on antiestrogen.   Pt has an increase in SOZO and so is now wearing a sleeve for 4 weeks  other history includes: DM, stage 3 CKD, osteoporosis  ? ?PRECAUTIONS: right UE Lymphedema risk ? ?SUBJECTIVE: I wear it every day  ? ?PAIN:  ?Are you having pain? No ? ?[facility]ZO SCREENING: ?Has decreased to 4.0 from 7.3 which is still 9.9 from baseline but not now the yellow.   ?Discussed with pt and PTA valerie and we will retest in 1 month ? ?Stark Bray, PT ?01/30/2022, 8:15 AM ? ?  ? ? ? ?

## 2022-02-01 ENCOUNTER — Telehealth: Payer: Self-pay | Admitting: *Deleted

## 2022-02-04 DIAGNOSIS — Z7984 Long term (current) use of oral hypoglycemic drugs: Secondary | ICD-10-CM | POA: Diagnosis not present

## 2022-02-04 DIAGNOSIS — E1169 Type 2 diabetes mellitus with other specified complication: Secondary | ICD-10-CM | POA: Diagnosis not present

## 2022-02-04 DIAGNOSIS — I152 Hypertension secondary to endocrine disorders: Secondary | ICD-10-CM | POA: Diagnosis not present

## 2022-02-04 DIAGNOSIS — E1159 Type 2 diabetes mellitus with other circulatory complications: Secondary | ICD-10-CM

## 2022-02-04 DIAGNOSIS — F17210 Nicotine dependence, cigarettes, uncomplicated: Secondary | ICD-10-CM | POA: Diagnosis not present

## 2022-02-04 DIAGNOSIS — E785 Hyperlipidemia, unspecified: Secondary | ICD-10-CM

## 2022-02-04 DIAGNOSIS — E1121 Type 2 diabetes mellitus with diabetic nephropathy: Secondary | ICD-10-CM

## 2022-02-05 ENCOUNTER — Inpatient Hospital Stay: Payer: PPO | Attending: Adult Health | Admitting: Adult Health

## 2022-02-05 ENCOUNTER — Other Ambulatory Visit: Payer: Self-pay

## 2022-02-05 ENCOUNTER — Encounter: Payer: Self-pay | Admitting: Adult Health

## 2022-02-05 VITALS — BP 154/83 | HR 83 | Temp 97.7°F | Resp 16 | Ht 62.0 in | Wt 149.7 lb

## 2022-02-05 DIAGNOSIS — Z170 Estrogen receptor positive status [ER+]: Secondary | ICD-10-CM | POA: Insufficient documentation

## 2022-02-05 DIAGNOSIS — R5383 Other fatigue: Secondary | ICD-10-CM | POA: Insufficient documentation

## 2022-02-05 DIAGNOSIS — Z923 Personal history of irradiation: Secondary | ICD-10-CM | POA: Diagnosis not present

## 2022-02-05 DIAGNOSIS — K219 Gastro-esophageal reflux disease without esophagitis: Secondary | ICD-10-CM | POA: Diagnosis not present

## 2022-02-05 DIAGNOSIS — Z79811 Long term (current) use of aromatase inhibitors: Secondary | ICD-10-CM | POA: Insufficient documentation

## 2022-02-05 DIAGNOSIS — I129 Hypertensive chronic kidney disease with stage 1 through stage 4 chronic kidney disease, or unspecified chronic kidney disease: Secondary | ICD-10-CM | POA: Insufficient documentation

## 2022-02-05 DIAGNOSIS — M810 Age-related osteoporosis without current pathological fracture: Secondary | ICD-10-CM | POA: Diagnosis not present

## 2022-02-05 DIAGNOSIS — F17210 Nicotine dependence, cigarettes, uncomplicated: Secondary | ICD-10-CM | POA: Diagnosis not present

## 2022-02-05 DIAGNOSIS — C50211 Malignant neoplasm of upper-inner quadrant of right female breast: Secondary | ICD-10-CM | POA: Diagnosis not present

## 2022-02-05 DIAGNOSIS — E079 Disorder of thyroid, unspecified: Secondary | ICD-10-CM | POA: Insufficient documentation

## 2022-02-05 DIAGNOSIS — M8580 Other specified disorders of bone density and structure, unspecified site: Secondary | ICD-10-CM | POA: Insufficient documentation

## 2022-02-05 DIAGNOSIS — Z7985 Long-term (current) use of injectable non-insulin antidiabetic drugs: Secondary | ICD-10-CM | POA: Insufficient documentation

## 2022-02-05 DIAGNOSIS — E785 Hyperlipidemia, unspecified: Secondary | ICD-10-CM | POA: Insufficient documentation

## 2022-02-05 DIAGNOSIS — N183 Chronic kidney disease, stage 3 unspecified: Secondary | ICD-10-CM | POA: Insufficient documentation

## 2022-02-05 DIAGNOSIS — E119 Type 2 diabetes mellitus without complications: Secondary | ICD-10-CM | POA: Insufficient documentation

## 2022-02-05 DIAGNOSIS — Z79899 Other long term (current) drug therapy: Secondary | ICD-10-CM | POA: Diagnosis not present

## 2022-02-05 DIAGNOSIS — Z7984 Long term (current) use of oral hypoglycemic drugs: Secondary | ICD-10-CM | POA: Diagnosis not present

## 2022-02-05 NOTE — Progress Notes (Signed)
SURVIVORSHIP VISIT: ? ? ?BRIEF ONCOLOGIC HISTORY:  ?Oncology History  ?Carcinoma of upper-inner quadrant of right breast in female, estrogen receptor positive ([place])  ?06/01/2021 Initial Diagnosis  ? Screening mammogram detected right breast cancer, biopsy invasive lobular cancer with LCIS grade 2, ER 90%, PR 10%, Ki-67 10%, HER2 negative ratio 1.15 ?  ?07/24/2021 Surgery  ? Right lumpectomy: Grade 1 invasive lobular cancer 2.1 cm, margins negative, 2/11 lymph nodes positive, ER 90%, PR 10%, HER2 equivocal by IHC negative by FISH ratio 1.15, Ki-67 10% (Dr. Marlou Starks: Surgeon) ?  ?09/20/2021 Oncotype testing  ? Oncotype DX recurrence score: 14: Distant recurrence at 9 years: 14% ?  ?09/28/2021 - 11/14/2021 Radiation Therapy  ? Site Technique Total Dose (Gy) Dose per Fx (Gy) Completed Fx Beam Energies  ?Breast, Right: Breast_R 3D 50.4/50.4 1.8 28/28 10XFFF  ?Breast, Right: Breast_R_SCLV 3D 50.4/50.4 1.8 28/28 6X, 10X  ?Breast, Right: Breast_R_Bst 3D 10/10 2 5/5 6X, 10X  ? ? ?  ?11/06/2021 Cancer Staging  ? Staging form: Breast, AJCC 8th Edition ?- Clinical: Stage IIA (cT2, cN1, cM0, G1, ER+, PR+, HER2-) - Signed by Nicholas Lose, MD on 11/06/2021 ?Stage prefix: Initial diagnosis ?Histologic grading system: 3 grade system ? ?  ? ? ?INTERVAL HISTORY:  ?April Ross to review her survivorship care plan detailing her treatment course for breast cancer, as well as monitoring long-term side effects of that treatment, education regarding health maintenance, screening, and overall wellness and health promotion.    ? ?Overall, April Ross reports feeling moderately well.  Her main issue has been profound fatigue since completing radiation and starting antiestrogen therapy.  She has continued on letrozole daily.  She notes that some mornings she struggles to get out of bed and put 1 foot in front of the other.  Otherwise she has no significant issues. ? ?In July her bone density test showed osteopenia with a T score of -2.4 in the left  femur. ? ?REVIEW OF SYSTEMS:  ?Review of Systems  ?Constitutional:  Positive for fatigue. Negative for appetite change, chills, fever and unexpected weight change.  ?HENT:   Negative for hearing loss, lump/mass and trouble swallowing.   ?Eyes:  Negative for eye problems and icterus.  ?Respiratory:  Negative for chest tightness, cough and shortness of breath.   ?Cardiovascular:  Negative for chest pain, leg swelling and palpitations.  ?Gastrointestinal:  Negative for abdominal distention, abdominal pain, constipation, diarrhea, nausea and vomiting.  ?Endocrine: Negative for hot flashes.  ?Genitourinary:  Negative for difficulty urinating.   ?Musculoskeletal:  Negative for arthralgias.  ?Skin:  Negative for itching and [place].  ?Neurological:  Negative for dizziness, extremity weakness, headaches and numbness.  ?Hematological:  Negative for adenopathy. Does not bruise/bleed easily.  ?Psychiatric/Behavioral:  Negative for depression. The patient is not nervous/anxious.   ?Breast: Denies any new nodularity, masses, tenderness, nipple changes, or nipple discharge.  ? ? ? ? ?ONCOLOGY TREATMENT TEAM:  ?1. Surgeon:  Dr. Marlou Starks at [facility] ?2. Medical Oncologist: Dr. Lindi Adie  ?3. Radiation Oncologist: Dr. Lisbeth Renshaw ?  ? ?PAST MEDICAL/SURGICAL HISTORY:  ?Past Medical History:  ?Diagnosis Date  ? Allergy   ? Arthritis   ? CRD (chronic renal disease)   ? stage 3  ? Depression   ? Diabetes mellitus without complication ([place])   ? GERD (gastroesophageal reflux disease)   ? Hyperlipidemia   ? Hypertension   ? LBBB (left bundle branch block)   ? a. diagnosed in 10/2017 - echo showed a preserved EF of  55-60% and NST was low-risk showing no evidence of ischemia.   ? Osteoporosis   ? polyps in colon   ? Thyroid disease   ? Urinary tract infection   ? ?Past Surgical History:  ?Procedure Laterality Date  ? ABDOMINAL HYSTERECTOMY    ? APPENDECTOMY    ? BREAST BIOPSY Right   ? BREAST LUMPECTOMY Right 07/2021  ? BREAST LUMPECTOMY  WITH RADIOACTIVE SEED AND SENTINEL LYMPH NODE BIOPSY Right 07/24/2021  ? Procedure: RIGHT BREAST LUMPECTOMY WITH RADIOACTIVE SEED AND SENTINEL LYMPH NODE BIOPSY;  Surgeon: Jovita Kussmaul, MD;  Location: [facility];  Service: General;  Laterality: Right;  ? ? ? ?ALLERGIES:  ?Allergies  ?Allergen Reactions  ? Acetaminophen   ?  [place] ?  ? Asa [Aspirin]   ?  [place] groin and axilla area  ? Ibuprofen   ? Other [place]  ?  Mycolog-II & HRT ?Hormone patch  ? ? ? ?CURRENT MEDICATIONS:  ?Outpatient Encounter Medications as of 02/05/2022  ?Medication Sig  ? Alcohol Swabs (B-D SINGLE USE SWABS REGULAR) PADS Use to check blood sugar up to 2 times a day  ? amLODipine (NORVASC) 5 MG tablet TAKE 1 TABLET (5 MG TOTAL) BY MOUTH DAILY.  ? Blood Glucose Monitoring Suppl (ONETOUCH VERIO REFLECT) w/Device KIT Use to check blood sugar up to 2 times a day  ? cetirizine (ZYRTEC) 10 MG tablet Take 10 mg by mouth at bedtime as needed for allergies.  ? CINNAMON PO Take 1 tablet by mouth daily.  ? Continuous Blood Gluc Sensor (FREESTYLE LIBRE 2 SENSOR) MISC Apply sensor every 14 days to monitor sugar continously  ? Dulaglutide (TRULICITY) 4.82 NO/0.3BC SOPN Inject 0.75 mg into the skin once a week.  ? fluticasone (FLONASE) 50 MCG/ACT nasal spray Place 2 sprays into both nostrils daily as needed for allergies.  ? gabapentin (NEURONTIN) 100 MG capsule Take 100 mg by mouth 3 (three) times daily as needed.  ? glucose blood (ONETOUCH VERIO) test strip Use to check blood sugar up to 2 times a day  ? glyBURIDE (DIABETA) 5 MG tablet Take 2 tablets (10 mg total) by mouth 2 (two) times daily with a meal.  ? Lancets (ONETOUCH DELICA PLUS [id number]) MISC Use to check blood sugar up to 2 times a day  ? letrozole (FEMARA) 2.5 MG tablet Take 1 tablet (2.5 mg total) by mouth daily.  ? levothyroxine (SYNTHROID) 88 MCG tablet TAKE 1 TABLET BY MOUTH EVERY DAY  ? losartan (COZAAR) 100 MG tablet TAKE 1 TABLET BY MOUTH EVERY DAY  ? Omega-3 Fatty Acids (FISH  OIL OMEGA-3 PO) Take 1 capsule by mouth daily.  ? rosuvastatin (CRESTOR) 20 MG tablet TAKE 1 TABLET BY MOUTH EVERY DAY  ? tiZANidine (ZANAFLEX) 4 MG tablet TAKE 1 TABLET BY MOUTH AT BEDTIME AS NEEDED FOR MUSCLE SPASMS.  ? traMADol (ULTRAM) 50 MG tablet Take 1 tablet (50 mg total) by mouth every 6 (six) hours as needed.  ? Turmeric (QC TUMERIC COMPLEX PO) Take 1 tablet by mouth daily.  ? VITAMIN D PO Take 2 each by mouth daily.  ? ?No facility-administered encounter medications on file as of 02/05/2022.  ? ? ? ?ONCOLOGIC FAMILY HISTORY:  ?Family History  ?Problem Relation Age of Onset  ? Alzheimer's disease Mother   ? Hyperlipidemia Mother   ? Hypertension Mother   ? Asthma Maternal Grandmother   ? Diabetes Maternal Grandfather   ? Alcohol abuse Maternal Grandfather   ? Hyperlipidemia Maternal Grandfather   ?  Stroke Maternal Grandfather   ? Hypertension Maternal Grandfather   ? Diabetes Paternal Grandmother   ? Alcohol abuse Maternal Uncle   ? Cholecystitis Maternal Uncle   ? Arthritis Maternal Uncle   ? ? ? ?GENETIC COUNSELING/TESTING: ?Not at this time ? ?SOCIAL HISTORY:  ?Social History  ? ?Socioeconomic History  ? Marital status: Widowed  ?  Spouse name: Not on file  ? Number of children: Not on file  ? Years of education: Not on file  ? Highest education level: Not on file  ?Occupational History  ? Not on file  ?Tobacco Use  ? Smoking status: Every Day  ?  Packs/day: 0.50  ?  Types: Cigarettes  ? Smokeless tobacco: Never  ?Vaping Use  ? Vaping Use: Never used  ?Substance and Sexual Activity  ? Alcohol use: Yes  ?  Alcohol/week: 0.0 standard drinks  ?  Comment: occ glass of wine or mix drink  ? Drug use: No  ? Sexual activity: Not Currently  ?Other Topics Concern  ? Not on file  ?Social History Narrative  ? 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   ? ?Social Determinants of Health  ? ?Financial Resource Strain: Low Risk   ? Difficulty of Paying  Living Expenses: Not very hard  ?Food Insecurity: No Food Insecurity  ? Worried About Charity fundraiser in the Last Year: Never true  ? Ran Out of Food in the Last Year: Never true  ?Transportation Needs: No Transp

## 2022-02-06 ENCOUNTER — Telehealth: Payer: Self-pay | Admitting: Hematology and Oncology

## 2022-02-06 NOTE — Telephone Encounter (Signed)
Per 5/1 in basket called and spoke to pt about 6 moth f/u  pt confirmed appointment  ?

## 2022-02-13 ENCOUNTER — Telehealth: Payer: Self-pay

## 2022-02-13 NOTE — Chronic Care Management (AMB) (Signed)
? ? ?  Chronic Care Management ?Pharmacy Assistant  ? ?Name: April Ross  MRN: [id number] DOB: 03/19/1951 ? ?Reason for Encounter: Reminder Call ?  ?Conditions to be addressed/monitored: ?HTN, HLD, and DMII ? ?Medications: ?Outpatient Encounter Medications as of 02/13/2022  ?Medication Sig Note  ? Alcohol Swabs (B-D SINGLE USE SWABS REGULAR) PADS Use to check blood sugar up to 2 times a day   ? amLODipine (NORVASC) 5 MG tablet TAKE 1 TABLET (5 MG TOTAL) BY MOUTH DAILY.   ? Blood Glucose Monitoring Suppl (ONETOUCH VERIO REFLECT) w/Device KIT Use to check blood sugar up to 2 times a day   ? cetirizine (ZYRTEC) 10 MG tablet Take 10 mg by mouth at bedtime as needed for allergies.   ? CINNAMON PO Take 1 tablet by mouth daily.   ? Continuous Blood Gluc Sensor (FREESTYLE LIBRE 2 SENSOR) MISC Apply sensor every 14 days to monitor sugar continously   ? Dulaglutide (TRULICITY) 9.45 WT/8.8EK SOPN Inject 0.75 mg into the skin once a week.   ? gabapentin (NEURONTIN) 100 MG capsule Take 100 mg by mouth 3 (three) times daily as needed.   ? glucose blood (ONETOUCH VERIO) test strip Use to check blood sugar up to 2 times a day   ? glyBURIDE (DIABETA) 5 MG tablet Take 2 tablets (10 mg total) by mouth 2 (two) times daily with a meal.   ? Lancets (ONETOUCH DELICA PLUS [id number]) MISC Use to check blood sugar up to 2 times a day   ? letrozole (FEMARA) 2.5 MG tablet Take 1 tablet (2.5 mg total) by mouth daily.   ? levothyroxine (SYNTHROID) 88 MCG tablet TAKE 1 TABLET BY MOUTH EVERY DAY   ? losartan (COZAAR) 100 MG tablet TAKE 1 TABLET BY MOUTH EVERY DAY   ? Omega-3 Fatty Acids (FISH OIL OMEGA-3 PO) Take 1 capsule by mouth daily.   ? rosuvastatin (CRESTOR) 20 MG tablet TAKE 1 TABLET BY MOUTH EVERY DAY   ? tiZANidine (ZANAFLEX) 4 MG tablet TAKE 1 TABLET BY MOUTH AT BEDTIME AS NEEDED FOR MUSCLE SPASMS. 02/05/2022: Takes prn  ? traMADol (ULTRAM) 50 MG tablet Take 1 tablet (50 mg total) by mouth every 6 (six) hours as needed. 02/05/2022: Prn ?  ?  Turmeric (QC TUMERIC COMPLEX PO) Take 1 tablet by mouth daily.   ? VITAMIN D PO Take 2 each by mouth daily.   ? ?No facility-administered encounter medications on file as of 02/13/2022.  ? ?April Ross was contacted to remind of upcoming telephone visit with April Ross on 02/16/22 at 8:45am. Patient was reminded to have any blood glucose and blood pressure readings available for review at appointment.  ? ?Message was left reminding patient of appointment. ? ? ?Star Rating Drugs: ?Medication:  Last Fill: Day Supply ?Rosuvastatin 20mg  06/28/21 90 ?Losartan 100mg  11/16/21  90 ?Glyburide 5mg   12/24/21 90 ?Trulicity 0.75mg  06/01/21 84 ? ?April Ross, CPP notified ? ?Lumen Brinlee, CCMA ?Health concierge  ?[phone]  ?

## 2022-02-14 ENCOUNTER — Other Ambulatory Visit: Payer: Self-pay | Admitting: Family Medicine

## 2022-02-14 NOTE — Telephone Encounter (Signed)
Last office visit 12/15/21 for [place].  Last refilled 12/15/21 for #90 with no refills.  Next Appt: 03/16/2022 for DM.  ?

## 2022-02-16 ENCOUNTER — Ambulatory Visit (INDEPENDENT_AMBULATORY_CARE_PROVIDER_SITE_OTHER): Payer: PPO | Admitting: Pharmacist

## 2022-02-16 ENCOUNTER — Telehealth: Payer: Self-pay | Admitting: Pharmacist

## 2022-02-16 DIAGNOSIS — E1121 Type 2 diabetes mellitus with diabetic nephropathy: Secondary | ICD-10-CM

## 2022-02-16 DIAGNOSIS — E1159 Type 2 diabetes mellitus with other circulatory complications: Secondary | ICD-10-CM

## 2022-02-16 DIAGNOSIS — E1169 Type 2 diabetes mellitus with other specified complication: Secondary | ICD-10-CM

## 2022-02-16 MED ORDER — GLIPIZIDE 5 MG PO TABS: 10.0000 mg | ORAL_TABLET | Freq: Two times a day (BID) | ORAL | 0 refills | Status: DC

## 2022-02-16 NOTE — Telephone Encounter (Signed)
Patient is taking glyburide 5 mg - 2 tab BID. Glyburide is not recommended with GFR < 60, it is also listed on Beers Criteria to avoid use in > 73 year old, glipizide is the preferred sulfonylurea in these patients. The equivalent dose for glipizide is 1:1. ? ?Discussed with patient and she is OK with switching. ? ?Recommendations: ?Change glyburide to glipizide 5 mg - 2 tab BID. ? ?Routing to PCP for approval ? ? ?Lab Results  ?Component Value Date  ? CREATININE 1.35 (H) 12/08/2021  ? BUN 17 12/08/2021  ? GFR 39.18 (L) 12/08/2021  ? GFRNONAA 38 (L) 07/21/2021  ? GFRAA 37 (L) 04/12/2020  ? NA 139 12/08/2021  ? K 4.0 12/08/2021  ? CALCIUM 9.8 12/08/2021  ? CO2 28 12/08/2021  ? ?Lab Results  ?Component Value Date/Time  ? HGBA1C 7.8 (H) 12/08/2021 07:30 AM  ? HGBA1C 6.4 06/09/2021 08:03 AM  ? ? ? ?

## 2022-02-16 NOTE — Addendum Note (Signed)
Addended by: Charlton Haws on: 02/16/2022 02:07 PM ? ? Modules accepted: Orders ? ?

## 2022-02-16 NOTE — Telephone Encounter (Signed)
eRx sent

## 2022-02-16 NOTE — Progress Notes (Signed)
? ?Chronic Care Management ?Pharmacy Note ? ?02/16/2022 ?Name:  April Ross MRN:  [id number] DOB:  12/12/1950 ? ?Summary: CCM F/U visit ?-Pt endorses endorses compliance with new Trulicity. CGM data reviewed, time in range 70-180 is 78%, estimated A1c (GMI) is 6.8% ?-Glyburide is not recommended with GFR < 60, it is also listed on Beers Criteria to avoid use in > 73 year old. Notably, pt has been taking glyburide for as long as GFR has been < 45 and she has not had significant hypoglycemia or side effects. She just refilled 90-day supply of glyburide and would rather finish this if possible. ?-Pt endorses persistent fatigue since radiation earlier this year; notably does not have recent B12 or VitD in chart ? ?Recommendations: ?-Change Glyburide to Glipizide 5 mg BID (GFR < 60); see phone note ?-Advised pt to discuss fatigue w/ PCP at upcoming appt - consider labwork for B12, VitD, TSH ? ?Plan: ?-[facility] will call patient 2 months for DM update ?-Pharmacist follow up televisit scheduled for 3 months ?-PCP 03/16/22 ? ? ? ?Subjective: ?April Ross is an 71 y.o. year old female who is a primary patient of Bedsole, Amy E, MD.  The CCM team was consulted for assistance with disease management and care coordination needs.   ? ?Engaged with patient by telephone for follow up visit in response to provider referral for pharmacy case management and/or care coordination services.  ? ?Consent to Services:  ?The patient was given information about Chronic Care Management services, agreed to services, and gave verbal consent prior to initiation of services.  Please see initial visit note for detailed documentation.  ? ?Patient Care Team: ?Jinny Sanders, MD as PCP - General (Family Medicine) ?Richard Miu, DMD as Consulting Physician (Dentistry) ?Nicholas Lose, MD as Consulting Physician (Hematology and Oncology) ?Jayko Voorhees, Cle[facility] [place], [facility] as Pharmacist (Pharmacist) ?Jovita Kussmaul, MD as Consulting Physician  (General Surgery) ?Kyung Rudd, MD as Consulting Physician (Radiation Oncology) ? ?Recent office visits: ?12/15/21-PCP- [name],MD- [facility].Discussed screenings,vaccines, counseled on smoking cessation-Follow BP at home. call if > 140/90. ? ?Recent consult visits: ?12/14/21- Physical Therapy- multiple visits -Right breast cancer ?11/28/21-[facility]- post op right breast lumpectomy and biopsy.No medication changes  ?11/07/21-[facility] Toth,MD- follow up after radiation-continue oral antibiotics for cellulitis right axilla. ?11/06/21-Oncology-Vinay Gudena,MD-Follow up breast cancer right.discussed treatment plan-radiation ?11/01/21-[facility]-Paul Toth,MD- follow up breast cancer, continue oral antibiotics  and monitor ?08/25/21-Oncology-Govinda Brahmanday,MD-Follow up breast cancer pathology and treatment plan. ?07/24/21-[facility]-Paul Daiva Nakayama, MD- Right breast lumpectomy-start Tramadol 25m take 1 tablet every 6 hours for pain  ? ?Hospital visits: ?None in previous 6 months ? ? ?Objective: ? ?Lab Results  ?Component Value Date  ? CREATININE 1.35 (H) 12/08/2021  ? BUN 17 12/08/2021  ? GFR 39.18 (L) 12/08/2021  ? GFRNONAA 38 (L) 07/21/2021  ? GFRAA 37 (L) 04/12/2020  ? NA 139 12/08/2021  ? K 4.0 12/08/2021  ? CALCIUM 9.8 12/08/2021  ? CO2 28 12/08/2021  ? GLUCOSE 160 (H) 12/08/2021  ? ? ?Lab Results  ?Component Value Date/Time  ? HGBA1C 7.8 (H) 12/08/2021 07:30 AM  ? HGBA1C 6.4 06/09/2021 08:03 AM  ? GFR 39.18 (L) 12/08/2021 07:30 AM  ? GFR 32.56 (L) 06/09/2021 08:03 AM  ? MICROALBUR 5.0 (H) 11/19/2017 10:18 AM  ?  ?Last diabetic Eye exam:  ?Lab Results  ?Component Value Date/Time  ? HMDIABEYEEXA Retinopathy (A) 12/20/2021 12:00 AM  ?  ?Last diabetic Foot exam:  ?Lab Results  ?  Component Value Date/Time  ? HMDIABFOOTEX done 06/15/2021 12:00 AM  ?  ? ?Lab Results  ?Component Value Date  ? CHOL 153 12/08/2021  ? HDL 30.80 (L) 12/08/2021  ? [place] 61 10/28/2017  ? LDLDIRECT 85.0 12/08/2021  ?  TRIG 277.0 (H) 12/08/2021  ? CHOLHDL 5 12/08/2021  ? ? ? ?  Latest Ref Rng & Units 12/08/2021  ?  7:30 AM 06/09/2021  ?  8:03 AM 02/17/2021  ?  7:41 AM  ?Hepatic Function  ?Total Protein 6.0 - 8.3 g/dL 6.5   7.3   7.6    ?Albumin 3.5 - 5.2 g/dL 4.3   4.8   4.9    ?AST 0 - 37 U/L 24   23   34    ?ALT 0 - 35 U/L 25   22   32    ?Alk Phosphatase 39 - 117 U/L 65   48   64    ?Total Bilirubin 0.2 - 1.2 mg/dL 0.5   0.4   0.4    ? ? ?Lab Results  ?Component Value Date/Time  ? TSH 2.37 12/08/2021 07:30 AM  ? TSH 2.89 11/18/2020 08:13 AM  ? FREET4 1.15 12/08/2021 07:30 AM  ? FREET4 1.22 11/18/2020 08:13 AM  ? ? ? ?  Latest Ref Rng & Units 12/01/2020  ?  6:35 PM 09/28/2020  ?  8:31 PM 09/26/2020  ?  8:22 AM  ?CBC  ?WBC 4.0 - 10.5 K/uL 8.5   8.3   6.9    ?Hemoglobin 12.0 - 15.0 g/dL 12.8   11.6   12.2    ?Hematocrit 36.0 - 46.0 % 38.2   36.4   38.1    ?Platelets 150 - 400 K/uL 240   223   212    ? ? ?Lab Results  ?Component Value Date/Time  ? VD25OH 49.99 02/18/2018 08:22 AM  ? VD25OH 46.97 05/09/2017 12:37 PM  ? ? ?Clinical ASCVD: No  ?The 10-year ASCVD risk score (Arnett DK, et al., 2019) is: 52.7% ?  Values used to calculate the score: ?    Age: 63 years ?    Sex: Female ?    Is Non-Hispanic African American: No ?    Diabetic: Yes ?    Tobacco smoker: Yes ?    Systolic Blood Pressure: 202 mmHg ?    Is BP treated: Yes ?    HDL Cholesterol: 30.8 mg/dL ?    Total Cholesterol: 153 mg/dL   ? ? ?  12/15/2021  ?  9:10 AM 06/15/2021  ?  8:55 AM 02/24/2021  ?  9:48 AM  ?Depression screen PHQ 2/9  ?Decreased Interest 2 1 2   ?Down, Depressed, Hopeless 2 1 2   ?PHQ - 2 Score 4 2 4   ?Altered sleeping 3 2 3   ?Tired, decreased energy 2 2 3   ?Change in appetite 0 1 1  ?Feeling bad or failure about yourself  0 1 1  ?Trouble concentrating 0 0 0  ?Moving slowly or fidgety/restless 0 0 0  ?Suicidal thoughts 0 0 0  ?PHQ-9 Score 9 8 12   ?Difficult doing work/chores  Not difficult at all Somewhat difficult  ?  ? ?Social History  ? ?Tobacco Use  ?Smoking  Status Every Day  ? Packs/day: 1.00  ? Years: 15.00  ? Pack years: 15.00  ? Types: Cigarettes  ?Smokeless Tobacco Never  ? ?BP Readings from Last 3 Encounters:  ?02/05/22 (!) 154/83  ?12/15/21 (!) 160/80  ?11/06/21 131/67  ? ?  Pulse Readings from Last 3 Encounters:  ?02/05/22 83  ?12/15/21 80  ?11/06/21 83  ? ?Wt Readings from Last 3 Encounters:  ?02/05/22 149 lb 11.2 oz (67.9 kg)  ?12/15/21 151 lb 3 oz (68.6 kg)  ?12/14/21 151 lb (68.5 kg)  ? ?BMI Readings from Last 3 Encounters:  ?02/05/22 27.38 kg/m?  ?12/15/21 27.43 kg/m?  ?12/14/21 27.62 kg/m?  ? ? ?Assessment/Interventions: Review of patient past medical history, allergies, medications, health status, including review of consultants reports, laboratory and other test data, was performed as part of comprehensive evaluation and provision of chronic care management services.  ? ?SDOH:  (Social Determinants of Health) assessments and interventions performed: Yes ? ? ?SDOH Screenings  ? ?Alcohol Screen: Not on file  ?Depression (PHQ2-9): Medium Risk  ? PHQ-2 Score: 9  ?Financial Resource Strain: Low Risk   ? Difficulty of Paying Living Expenses: Not very hard  ?Food Insecurity: No Food Insecurity  ? Worried About Charity fundraiser in the Last Year: Never true  ? Ran Out of Food in the Last Year: Never true  ?Housing: Low Risk   ? Last Housing Risk Score: 0  ?Physical Activity: Not on file  ?Social Connections: Not on file  ?Stress: Not on file  ?Tobacco Use: High Risk  ? Smoking Tobacco Use: Every Day  ? Smokeless Tobacco Use: Never  ? Passive Exposure: Not on file  ?Transportation Needs: No Transportation Needs  ? Lack of Transportation (Medical): No  ? Lack of Transportation (Non-Medical): No  ? ? ?CCM Care Plan ? ?Allergies  ?Allergen Reactions  ? Acetaminophen   ?  [place] ?  ? Asa [Aspirin]   ?  [place] groin and axilla area  ? Ibuprofen   ? Other [place]  ?  Mycolog-II & HRT ?Hormone patch  ? ? ?Medications Reviewed Today   ? ? Reviewed by Charlton Haws, [facility]  (Pharmacist) on 02/16/22 at [phone]  Med List Status: <None>  ? ?Medication Order Taking? Sig Documenting Provider Last Dose Status Informant  ?Alcohol Swabs (B-D SINGLE USE SWABS REGULAR) PADS [id number] Yes Use

## 2022-02-16 NOTE — Telephone Encounter (Signed)
I agree with the change from glyburide to glipizide. ?

## 2022-02-16 NOTE — Patient Instructions (Signed)
Visit Information ? ?Phone number for Pharmacist: [phone] ? ? Goals Addressed   ? ?  ?  ?  ?  ? This Visit's Progress  ?  Monitor and Manage My Blood Sugar-Diabetes Type 2     ?  Timeframe:  Long-Range Goal ?Priority:  Medium ?Start Date:    01/16/22                         ?Expected End Date:    01/17/23                  ? ?Follow Up Date Aug 2023 ?  ?- check blood sugar at prescribed times using [facility]  ?- take the blood sugar meter to all doctor visits  ?  ?Why is this important?   ?Checking your blood sugar at home helps to keep it from getting very high or very low.  ?Writing the results in a diary or log helps the doctor know how to care for you.  ?Your blood sugar log should have the time, date and the results.  ?Also, write down the amount of insulin or other medicine that you take.  ?Other information, like what you ate, exercise done and how you were feeling, will also be helpful.   ?  ?Notes:  ?  ? ?  ? ? ?Care Plan : [place]  ?Updates made by Charlton Haws, RPH since 02/16/2022 12:00 AM  ?  ? ?Problem: Hypertension, Hyperlipidemia, and Diabetes   ?Priority: High  ?  ? ?Long-Range Goal: Disease mgmt   ?Start Date: 01/16/2022  ?Expected End Date: 01/17/2023  ?This Visit's Progress: On track  ?Recent Progress: On track  ?Priority: High  ?Note:   ?Current Barriers:  ?Suboptimal therapeutic regimen for DM ? ?Pharmacist Clinical Goal(s):  ?Patient will adhere to plan to optimize therapeutic regimen for DM as evidenced by report of adherence to recommended medication management changes through collaboration with PharmD and provider.  ? ?Interventions: ?1:1 collaboration with Jinny Sanders, MD regarding development and update of comprehensive plan of care as evidenced by provider attestation and co-signature ?Inter-disciplinary care team collaboration (see longitudinal plan of care) ?Comprehensive medication review performed; medication list updated in electronic medical  record ? ?Hypertension (BP goal <140/90) ?-Controlled - BP at goal in clinic ?-Current treatment: ?Amlodipine 5 mg daily - Appropriate, Effective, Safe, Accessible ?Losartan 100 mg daily - Appropriate, Effective, Safe, Accessible ?-Medications previously tried: n/a  ?-Educated on BP goals and benefits of medications for prevention of heart attack, stroke and kidney damage; ?-Counseled to monitor BP at home periodically ?-Recommended to continue current medication ? ?Hyperlipidemia: (LDL goal < 100) ?-Controlled - LDL 85 (12/2021) at goal; pt endorses compliance with rosuvastatin, she had bought it outside of insurance once so has extra supply ?-Current treatment: ?Rosuvastatin 20 mg daily - Appropriate, Effective, Safe, Accessible ?-Medications previously tried: n/a  ?-Educated on Cholesterol goals; Benefits of statin for ASCVD risk reduction; ?-Recommended to continue current medication ? ?Diabetes (A1c goal <7%) ?-Improving - A1c 7.8% (12/2021); pt reports compliance with Trulicity and glyburide ?-Current home glucose readings: reviewed AGP 02/03/22-02/16/22 ? TIR 70-180: 78% (target > 70%); high 181-250: 20%; very high >250: 1% ?average glu 146; GMI 6.8% ? ?-Diet: pt lives with her daughter who prepares most meals and is not consistently following diabetic diet ?-Current medications: ?Glyburide 5 mg - 2 tab BID - Appropriate, Effective, Query Safe ?Trulicity 9.38 mg weekly -  Appropriate, Effective, Safe, Accessible ?Freestyle Libre 2 - Appropriate, Effective, Safe, Accessible ?-Medications previously tried: Musician (cost), metformin (SE) ?-Educated on A1c and blood sugar goals; Complications of diabetes including kidney damage, retinal damage, and cardiovascular disease; Continuous glucose monitoring - would like to try [employer] ?-Discussed glyburide is not the best option given her kidney disease - glipizide is preferred for GFR < 60. However, she has been taking glyburide for over a year without issue and  GFR has been fairly stable around 30 the whole time, she has not had significant hypoglycemic issues. ?-Recommend to change glyburide to glipizide 5 mg BID (glyburide should be avoided with GFR < 60) ? ?Breast cancer  ?-Dx 05/2021, s/p lumpectomy 07/2021, radiation 09/2021-11/2021; follows with oncology (Dr Lindi Adie) ?-pt c/o of significant fatigue since completing radiation; recently moved letrozole to bedtime per advice from oncology, she has not noticed much difference yet ?-Current treatment  ?Letrozole 2.5 mg daily HS ?Gabapentin 100 mg TID prn (surgical pain) ?Tizanidine 4 mg HS prn ?-Medications previously tried: n/a  ?-Recommended to continue current medication ? ?Fatigue (Goal: improve sx) ?-Uncontrolled - pt reports significant fatigue since completing  ?-Discussed possible causes of fatigue including vitamin deficiencies, hypothyroidism, poor sleep, lack of exercise ?-Recommend pt discuss options with PCP - consider labwork for Vitamin D, B12, TSH ? ? ?Patient Goals/Self-Care Activities ?Patient will:  ?- take medications as prescribed as evidenced by patient report and record review ?focus on medication adherence by routine ?check glucose daily, document, and provide at future appointments ? ?  ?  ? ?Patient verbalizes understanding of instructions and care plan provided today and agrees to view in [place]. Active MyChart status confirmed with patient.   ?Telephone follow up appointment with pharmacy team member scheduled for: 3 months ? ?Charlene Brooke, PharmD, BCACP ?Clinical Pharmacist ?[facility] ?[phone] ?  ?

## 2022-02-26 ENCOUNTER — Ambulatory Visit: Payer: PPO | Attending: Hematology and Oncology

## 2022-02-26 VITALS — Wt 149.2 lb

## 2022-02-26 DIAGNOSIS — Z170 Estrogen receptor positive status [ER+]: Secondary | ICD-10-CM | POA: Diagnosis not present

## 2022-02-26 DIAGNOSIS — C50411 Malignant neoplasm of upper-outer quadrant of right female breast: Secondary | ICD-10-CM | POA: Diagnosis not present

## 2022-02-26 DIAGNOSIS — Z483 Aftercare following surgery for neoplasm: Secondary | ICD-10-CM | POA: Insufficient documentation

## 2022-02-26 NOTE — Therapy (Signed)
OUTPATIENT PHYSICAL THERAPY SOZO SCREENING NOTE   Patient Name: April Ross MRN: [id number] DOB:[birth year], 71 y.o., female Today's Date: 02/26/2022  PCP: Jinny Sanders, MD REFERRING PROVIDER: Jovita Kussmaul, MD   PT End of Session - 02/26/22 0800     Visit Number 16   # unchanged due to screen only   PT Start Time 0756    PT Stop Time 0812    PT Time Calculation (min) 16 min    Activity Tolerance Patient tolerated treatment well    Behavior During Therapy [facility] for tasks assessed/performed             Past Medical History:  Diagnosis Date   Allergy    Arthritis    CRD (chronic renal disease)    stage 3   Depression    Diabetes mellitus without complication (HCC)    GERD (gastroesophageal reflux disease)    Hyperlipidemia    Hypertension    LBBB (left bundle branch block)    a. diagnosed in 10/2017 - echo showed a preserved EF of 55-60% and NST was low-risk showing no evidence of ischemia.    Osteoporosis    polyps in colon    Thyroid disease    Urinary tract infection    Past Surgical History:  Procedure Laterality Date   ABDOMINAL HYSTERECTOMY     APPENDECTOMY     BREAST BIOPSY Right    BREAST LUMPECTOMY Right 07/2021   BREAST LUMPECTOMY WITH RADIOACTIVE SEED AND SENTINEL LYMPH NODE BIOPSY Right 07/24/2021   Procedure: RIGHT BREAST LUMPECTOMY WITH RADIOACTIVE SEED AND SENTINEL LYMPH NODE BIOPSY;  Surgeon: Jovita Kussmaul, MD;  Location: [facility];  Service: General;  Laterality: Right;   Patient Active Problem List   Diagnosis Date Noted   Chronic tension-type headache, not intractable 07/25/2021   Carcinoma of upper-inner quadrant of right breast in female, estrogen receptor positive ([place]) 06/15/2021   Diabetic retinopathy ([place]) 11/24/2020   Chronic bilateral thoracic back pain 11/21/2018   Type 2 diabetes mellitus with nephropathy ([place]) 10/27/2017   Tobacco abuse 10/27/2017   Nonspecific chest pain    Osteoporosis 05/24/2016   CKD  (chronic kidney disease) stage 3, GFR 30-59 ml/min (HCC) 05/08/2016   Hyperlipidemia associated with type 2 diabetes mellitus ([place]) 01/12/2016   Hypothyroidism 01/12/2016   Hypertension associated with diabetes ([place]) 01/12/2016   Allergic rhinitis 01/12/2016   Moderate major depression, single episode ([place]) 01/12/2016   Chronic insomnia 01/12/2016   GERD (gastroesophageal reflux disease) 01/12/2016    REFERRING DIAG: right breast cancer at risk for lymphedema  THERAPY DIAG:  Aftercare following surgery for neoplasm  PERTINENT HISTORY: Pt will be had a Rt lumpectomy with 2/11 lymph nodes positive on 07/24/21.Had infection and drainage just proximal to incision site during radiatio Done with radiation 2/7/20923 . No chemotherapy and now on antiestrogen.   Pt has an increase in SOZO and so is now wearing a sleeve for 4 weeks  other history includes: DM, stage 3 CKD, osteoporosis   PRECAUTIONS: right UE Lymphedema risk, None  SUBJECTIVE: Pt returns for her 3 month L-Dex screen.   PAIN:  Are you having pain? No  SOZO SCREENING:  Patient was assessed today using the SOZO machine to determine the lymphedema index score. This was compared to her baseline score. It was determined that she is NOT within the recommended range when compared to her baseline and so she was fitted for a compression garment while in the clinic today. Her  number has been elevated for awhile now so will initiate treatment at this time to instruct pt in compression bandaging. She was agreeable to this today.   With change in status with change in baseline, PTA consuled with a PT. PT determined it would be appropriate to initiate therapy at this time.    Otelia Limes, PTA 02/26/2022, 10:36 AM

## 2022-03-07 DIAGNOSIS — E1169 Type 2 diabetes mellitus with other specified complication: Secondary | ICD-10-CM | POA: Diagnosis not present

## 2022-03-07 DIAGNOSIS — F17210 Nicotine dependence, cigarettes, uncomplicated: Secondary | ICD-10-CM | POA: Diagnosis not present

## 2022-03-07 DIAGNOSIS — Z7984 Long term (current) use of oral hypoglycemic drugs: Secondary | ICD-10-CM | POA: Diagnosis not present

## 2022-03-07 DIAGNOSIS — Z170 Estrogen receptor positive status [ER+]: Secondary | ICD-10-CM

## 2022-03-07 DIAGNOSIS — Z794 Long term (current) use of insulin: Secondary | ICD-10-CM

## 2022-03-07 DIAGNOSIS — E785 Hyperlipidemia, unspecified: Secondary | ICD-10-CM | POA: Diagnosis not present

## 2022-03-07 DIAGNOSIS — C50211 Malignant neoplasm of upper-inner quadrant of right female breast: Secondary | ICD-10-CM | POA: Diagnosis not present

## 2022-03-07 DIAGNOSIS — I10 Essential (primary) hypertension: Secondary | ICD-10-CM | POA: Diagnosis not present

## 2022-03-13 ENCOUNTER — Encounter: Payer: Self-pay | Admitting: Rehabilitation

## 2022-03-13 ENCOUNTER — Ambulatory Visit: Payer: PPO | Attending: Hematology and Oncology | Admitting: Rehabilitation

## 2022-03-13 DIAGNOSIS — Z483 Aftercare following surgery for neoplasm: Secondary | ICD-10-CM | POA: Diagnosis not present

## 2022-03-13 DIAGNOSIS — R293 Abnormal posture: Secondary | ICD-10-CM | POA: Diagnosis not present

## 2022-03-13 DIAGNOSIS — C50211 Malignant neoplasm of upper-inner quadrant of right female breast: Secondary | ICD-10-CM | POA: Insufficient documentation

## 2022-03-13 DIAGNOSIS — I890 Lymphedema, not elsewhere classified: Secondary | ICD-10-CM | POA: Insufficient documentation

## 2022-03-13 DIAGNOSIS — Z170 Estrogen receptor positive status [ER+]: Secondary | ICD-10-CM | POA: Diagnosis not present

## 2022-03-13 NOTE — Therapy (Addendum)
OUTPATIENT PHYSICAL THERAPY TREATMENT NOTE   Patient Name: April Ross MRN: [id number] DOB:1951-04-04, 71 y.o., female Today's Date: 03/13/2022  PCP: Dr. Eliezer Lofts REFERRING PROVIDER: Dr. Lindi Adie   PT End of Session - 03/13/22 2053     Visit Number 17    Number of Visits 20    Date for PT Re-Evaluation 04/10/22    PT Start Time 0816    PT Stop Time 0900    PT Time Calculation (min) 44 min    Activity Tolerance Patient tolerated treatment well    Behavior During Therapy [facility] for tasks assessed/performed               Past Medical History:  Diagnosis Date   Allergy    Arthritis    CRD (chronic renal disease)    stage 3   Depression    Diabetes mellitus without complication (HCC)    GERD (gastroesophageal reflux disease)    Hyperlipidemia    Hypertension    LBBB (left bundle branch block)    a. diagnosed in 10/2017 - echo showed a preserved EF of 55-60% and NST was low-risk showing no evidence of ischemia.    Osteoporosis    polyps in colon    Thyroid disease    Urinary tract infection    Past Surgical History:  Procedure Laterality Date   ABDOMINAL HYSTERECTOMY     APPENDECTOMY     BREAST BIOPSY Right    BREAST LUMPECTOMY Right 07/2021   BREAST LUMPECTOMY WITH RADIOACTIVE SEED AND SENTINEL LYMPH NODE BIOPSY Right 07/24/2021   Procedure: RIGHT BREAST LUMPECTOMY WITH RADIOACTIVE SEED AND SENTINEL LYMPH NODE BIOPSY;  Surgeon: Jovita Kussmaul, MD;  Location: [facility];  Service: General;  Laterality: Right;   Patient Active Problem List   Diagnosis Date Noted   Chronic tension-type headache, not intractable 07/25/2021   Carcinoma of upper-inner quadrant of right breast in female, estrogen receptor positive ([place]) 06/15/2021   Diabetic retinopathy ([place]) 11/24/2020   Chronic bilateral thoracic back pain 11/21/2018   Type 2 diabetes mellitus with nephropathy ([place]) 10/27/2017   Tobacco abuse 10/27/2017   Nonspecific chest pain    Osteoporosis  05/24/2016   CKD (chronic kidney disease) stage 3, GFR 30-59 ml/min (HCC) 05/08/2016   Hyperlipidemia associated with type 2 diabetes mellitus ([place]) 01/12/2016   Hypothyroidism 01/12/2016   Hypertension associated with diabetes ([place]) 01/12/2016   Allergic rhinitis 01/12/2016   Moderate major depression, single episode ([place]) 01/12/2016   Chronic insomnia 01/12/2016   GERD (gastroesophageal reflux disease) 01/12/2016    REFERRING DIAG: right breast cancer  THERAPY DIAG:  Aftercare following surgery for neoplasm   Carcinoma of upper-inner quadrant of right breast in female, estrogen receptor positive ([place])   Abnormal posture  PERTINENT HISTORY: Pt will be had a Rt lumpectomy with 2/11 lymph nodes positive on 07/24/21.Had infection and drainage just proximal to incision site during radiatio Done with radiation 2/7/20923 . No chemotherapy and now on antiestrogen.   Pt has an increase in SOZO and so is now wearing a sleeve for 4 weeks  other history includes: DM, stage 3 CKD, osteoporosis   PRECAUTIONS: at risk for lymphedema  SUBJECTIVE:  I think I have the lymphedema   PAIN:  Are you having pain? No    Today's treatment :  03/20/22 addendum: pt messaged saying she would rather just do the night garment.  Checked insurance and she has no coverage so I messaged pt regarding US showing her what  to order online vs emailing the instructions to her.  03/13/22 Discussed how pt has seemed to develop lymphedema now post radiation and SLNB as SOZO continues to stay in the high yellow and red.  Pt has been wearing her sleeve and demonstrates no change to slight decrease in circumferential measurements taken today compared to March.  Pt notices the arm swollen occasionally but overall does not have much problem with edema.  Discussed how that the hope is it will not get any worse as it was caught so early.  Demonstrated bandaging with handout on bandage care and links for MD Anderson self bandaging  and caregiver bandaging your arm.  Applied stockinette, fingers 1-4, artiflex from hand to axilla, 1-6cm, 1-8cm and 1-10cm short stretch bandage from hand to axilla with demo, vcs, and hints on application.  Pt was instructed to practice application for now and not attempt sleeping in the bandage until feeling comfortable.  Discussed how bandaging is a good skill to possess for reduction even if she does not have to bandage every night or ends up getting a night garment.    12/28/21 Repeat SOZO:  Continues to stay elevated.  Ordered juzo dynamic size 33mx sleeve and size small gauntlet to increase containment.  Discussed how pt is doing everything possible to keep lymphedema at b[place]and that it may still not progress to chronic.   MT: Reviewed self MLD in seated with pt doing all steps very well.  No vcs for modification needed Then PT performed in supine: short neck, superficial and deep abdominals, bil axillary nodes and anterior interaxillary anastamosis, right  inguinal nodes and axillo-inguinal anastamosis, right shoulder, upper arm, elbow and lower arm and hand with return along pathways   12/26/21 Manual therapy In Supine: Manual lymph drainage as follows: short neck, superficial and deep abdominals, bil axillary nodes and anterior interaxillary anastamosis, right  inguinal nodes and axillo-inguinal anastamosis, right shoulder, upper arm, elbow and lower arm and hand with return along pathways  MFR: to Rt axilla and into upper arm; also around what may be a seroma P/ROM: In Supine to Rt shoulder into flexion, abduction and D2 to pts available end ROM Scar mobility work noticing a small harder lump with some redness above the lumpectomy incision which pt notes has been like that for awhile but not always.  Did not soften much during scar mobilization so pt was instructed on watching this for the next few days and letting the MD know  12/21/21 Manual therapy Remeasured for new sleeve but after  discussing with PTA VMateo Flowwe decided that since SOZO is continuing to trend downward we would stay with harmony sleeve.  Pt had initially wanted a sleeve due to discomfort but it is improving.  Discussed washing as pt has not yet washed and dried.   In Supine: Manual lymph drainage as follows: short neck, superficial and deep abdominals, bil axillary nodes and anterior interaxillary anastamosis, right  inguinal nodes and axillo-inguinal anastamosis, right shoulder, upper arm, elbow and lower arm and hand with return along pathways  MFR: to Rt axilla and into upper arm; also around what may be a seroma P/ROM: In Supine to Rt shoulder into flexion, abduction and D2 to pts available end ROM  PATIENT EDUCATION: Education details: self bandaging    HOME EXERCISE PROGRAM:  Plan - 11/20/21 [id number]      Clinical Impression Statement Pt is showing reduction with use of Juzo dynamic but continues in red SOZO and subjective  feelings of swelling.  Education on bandaging today which pt will practice until return next week. Extended POC 1x per week for 3 weeks to ensure bandaging independence.     Personal Factors and Comorbidities Age;Comorbidity 3+     Comorbidities DM, osteoporosis, CKD     Stability/Clinical Decision Making Stable/Uncomplicated     Clinical Decision Making Low     Rehab Potential Excellent     PT Frequency 1x / week     PT Duration Up to 4 weeks     PT Treatment/Interventions ADLs/Self Care Home Management;Patient/family education;Therapeutic exercise;Manual techniques     PT Next Visit Plan How was bandaging? Repeat in clinic for modifications as needed, visits as needed    PT Home Exercise Plan Access Code: [email or web address] and post op supine, self bandaging, MLD, daily compression    Consulted and Agree with Plan of Care Patient      Stark Bray, PT 03/13/2022, 9:05 PM

## 2022-03-13 NOTE — Patient Instructions (Signed)
Washing bandages:  Wash periodically using a mild detergent in warm water.  Do not use fabric softener or bleach.  Place bandages in a mesh lingerie bag or in a tied off pillow case and use the gentle cycle of the washing machine or hand wash. If you hand wash, you may want to put them in the spin cycle of your washer to get the extra water out, but make sure you put them in a mesh bag first. Do not wring or stretch them while they are wet.  Drying bandages: Lay the bandages out smoothly on a towel away from direct sunlight or heating sources that can damage the fabric. Rolling bandages in a towel and gently squeezing the towel to remove excess water before laying them out can speed up the process.  If you use a drying rack, place a towel on top of the rack to lay the bandages on.  If they hang down to dry, they fabric could be stretched out and the bandage will lose its compression.   Or, keep bandages in the mesh bag and dry them in the dryer on the low or no heat cycle. Rolling bandages: Please roll your bandages after drying them so they are ready for your next treatment. If they are rolled too loose, they will be difficult to apply.  If rolled too tight, they can get stretched out.   TAKE CARE OF YOUR SKIN Apply a low pH moisturizing lotion to your skin daily Avoid scratching your skin Treat skin irritations quickly  Know the 5 warning signs of infection: redness, pain, warmth to touch, fever and increased swelling.  Call your physician immediately if you notice any of these signs of a possible infection.  Self bandaging the arm:   Follow along with this video:  [employer]  -OR-  Search in your internet browser: "self bandaging arm MD Ouida Sills"   And the video should appear in the video search results "Lymphedema Management: Self bandaging your arm" on YouTube   This video is for caregiver  assistance:  [employer]com  -OR-  Enter " Lymphedema Management: Caregiver Bandaging you arm" into your search browser and your video should appear in the results

## 2022-03-14 ENCOUNTER — Encounter: Payer: Self-pay | Admitting: Rehabilitation

## 2022-03-16 ENCOUNTER — Ambulatory Visit (INDEPENDENT_AMBULATORY_CARE_PROVIDER_SITE_OTHER): Payer: PPO | Admitting: Family Medicine

## 2022-03-16 ENCOUNTER — Encounter: Payer: Self-pay | Admitting: Family Medicine

## 2022-03-16 VITALS — BP 124/80 | HR 76 | Temp 98.1°F | Ht 62.0 in | Wt 148.0 lb

## 2022-03-16 DIAGNOSIS — F5104 Psychophysiologic insomnia: Secondary | ICD-10-CM | POA: Diagnosis not present

## 2022-03-16 DIAGNOSIS — M549 Dorsalgia, unspecified: Secondary | ICD-10-CM | POA: Diagnosis not present

## 2022-03-16 DIAGNOSIS — E1159 Type 2 diabetes mellitus with other circulatory complications: Secondary | ICD-10-CM

## 2022-03-16 DIAGNOSIS — E1169 Type 2 diabetes mellitus with other specified complication: Secondary | ICD-10-CM | POA: Diagnosis not present

## 2022-03-16 DIAGNOSIS — E785 Hyperlipidemia, unspecified: Secondary | ICD-10-CM

## 2022-03-16 DIAGNOSIS — I152 Hypertension secondary to endocrine disorders: Secondary | ICD-10-CM

## 2022-03-16 DIAGNOSIS — E1121 Type 2 diabetes mellitus with diabetic nephropathy: Secondary | ICD-10-CM

## 2022-03-16 DIAGNOSIS — R5383 Other fatigue: Secondary | ICD-10-CM | POA: Diagnosis not present

## 2022-03-16 LAB — CBC WITH DIFFERENTIAL/PLATELET
Basophils Absolute: 0.1 10*3/uL (ref 0.0–0.1)
Basophils Relative: 1 % (ref 0.0–3.0)
Eosinophils Absolute: 0.3 10*3/uL (ref 0.0–0.7)
Eosinophils Relative: 5.5 % — ABNORMAL HIGH (ref 0.0–5.0)
HCT: 40.7 % (ref 36.0–46.0)
Hemoglobin: 13.5 g/dL (ref 12.0–15.0)
Lymphocytes Relative: 12.8 % (ref 12.0–46.0)
Lymphs Abs: 0.8 10*3/uL (ref 0.7–4.0)
MCHC: 33.1 g/dL (ref 30.0–36.0)
MCV: 85.9 fl (ref 78.0–100.0)
Monocytes Absolute: 0.9 10*3/uL (ref 0.1–1.0)
Monocytes Relative: 13.8 % — ABNORMAL HIGH (ref 3.0–12.0)
Neutro Abs: 4.1 10*3/uL (ref 1.4–7.7)
Neutrophils Relative %: 66.9 % (ref 43.0–77.0)
Platelets: 197 10*3/uL (ref 150.0–400.0)
RBC: 4.73 Mil/uL (ref 3.87–5.11)
RDW: 14.1 % (ref 11.5–15.5)
WBC: 6.2 10*3/uL (ref 4.0–10.5)

## 2022-03-16 LAB — HEMOGLOBIN A1C: Hgb A1c MFr Bld: 7 % — ABNORMAL HIGH (ref 4.6–6.5)

## 2022-03-16 LAB — VITAMIN D 25 HYDROXY (VIT D DEFICIENCY, FRACTURES): VITD: 70.19 ng/mL (ref 30.00–100.00)

## 2022-03-16 LAB — VITAMIN B12: Vitamin B-12: 557 pg/mL (ref 211–911)

## 2022-03-16 NOTE — Assessment & Plan Note (Signed)
Chronic fatigue, likely multifactorial  In part due to last year breast cancer treatment including surgery, radiation as well as current letrozole treatment. We will reevaluate with labs to check for anemia, B12 and vitamin D deficiency although she is taking supplements. Thyroid control in last 3 months has been normal on levothyroxine. She may also in part be deconditioned given her relative inactivity during breast cancer treatment.  I have encouraged her to work on regular exercise and gradually increase her endurance. She also notes she sleeps poorly at night and snores.  If evaluation is negative with lab work we can consider sleep study. I offered her medication to help with sleep but she has had side effects to both Ambien and trazodone in the past and is not interested in any medication at this time.

## 2022-03-16 NOTE — Patient Instructions (Addendum)
Please stop at the lab to have labs drawn. Work on low [employer].. focus on making lower carb, higher fiber , higher protein breakfast choices.  Try to stay in the low glycemic range for dinners and lunches as well.    Start upper back exercise..  follow up if upper back pain not improving.

## 2022-03-16 NOTE — Assessment & Plan Note (Signed)
Chronic, fluctuating control.  Attempted point-of-care A1c in the office but there is a malfunction multiple times.  We will obtain a blood draw in the lab for A1c today.  She is tolerating Trulicity 0.78 mg weekly.  She is now on glipizide 10 mg p.o. twice daily. On reviewing her continuous blood sugar monitor we noted that she seems to spike blood sugars after breakfast.  We discussed in detail her breakfast items.  We noted that after eating a lower carb breakfast she had less of a spike.  Instructional time was spent with patient discussing low glycemic options.  Information packet was given.  She will talk with her daughter who is the home cook about lower carb options for her at dinner.  I offered referral to a nutritionist for her and her daughter but she is not interested.  We also discussed potentially increasing the Trulicity but she is not interested at this time.

## 2022-03-16 NOTE — Assessment & Plan Note (Signed)
Acute  Most likely musculoskeletal strain given no fall or change in activity.  Less likely side effect to letrozole.  She will start with upper back stretching, information given.  She will follow-up if is not improving as expected

## 2022-03-16 NOTE — Assessment & Plan Note (Signed)
Chronic, improved control  Continue amlodipine 5 mg p.o. daily and losartan 100 mg p.o. daily

## 2022-03-16 NOTE — Progress Notes (Signed)
Patient ID: April Ross, female    DOB: 02-15-1951, 71 y.o.   MRN: [id number]  This visit was conducted in person.  BP 124/80 (BP Location: Left Arm, Patient Position: Sitting, Cuff Size: Normal)   Pulse 76   Temp 98.1 F (36.7 C) (Temporal)   Ht 5' 2" (1.575 m)   Wt 148 lb (67.1 kg)   BMI 27.07 kg/m    CC:  Chief Complaint  Patient presents with   Diabetes    Subjective:   HPI: April Ross is a 71 y.o. female presenting on 03/16/2022 for Diabetes   Reviewed CCM note pharmacy from 02/16/2022 Had noted fatigue and no recent B12 or Vit D testing. She has been very fatigue ever since surgery, radiation   for breast cancer in 07/2021. Femara has cause fatigue to worsen in last 2 month   Poor rest at night,  tosses and turns. She does snore. Does not want to take trazodone or Ambien as both cause SE.  Never had sleep apnea testing.  She has also been having.. pain in upper back in last week on right, no fall, no change in activity.  No numbness, no weakness. She does have lymphedema in right arm from Breast cancer surgery.     Now on glipizide sine 02/16/2022  Diabetes:   On Trulicity 6.60 mg weekly... non change in appetite  Now using CGM. Using medications without difficulties: none Hypoglycemic episodes: some 59 and 63... dropping after  lunch Hyperglycemic episodes: spiking after breakfast ( does  have bagels). Spikes after white rice, bread. Feet problems: no ulcers Blood Sugars averaging:  Average 169 eye exam within last year: uptodate  Wt Readings from Last 3 Encounters:  03/16/22 148 lb (67.1 kg)  02/26/22 149 lb 4 oz (67.7 kg)  02/05/22 149 lb 11.2 oz (67.9 kg)   Hypertension:   Good control on amlodipine   5 Mg daily  and losartan 100 mg daily BP Readings from Last 3 Encounters:  03/16/22 124/80  02/05/22 (!) 154/83  12/15/21 (!) 160/80  Using medication without problems or lightheadedness:  none Chest pain with exertion: none Edema: none Short  of breath: none Average home BPs: Other issues:  Less stamina since breast surgery... she is trying to increase exercise.      Relevant past medical, surgical, family and social history reviewed and updated as indicated. Interim medical history since our last visit reviewed. Allergies and medications reviewed and updated. Outpatient Medications Prior to Visit  Medication Sig Dispense Refill   Alcohol Swabs (B-D SINGLE USE SWABS REGULAR) PADS Use to check blood sugar up to 2 times a day 100 each 5   amLODipine (NORVASC) 5 MG tablet TAKE 1 TABLET (5 MG TOTAL) BY MOUTH DAILY. 90 tablet 0   Blood Glucose Monitoring Suppl (ONETOUCH VERIO REFLECT) w/Device KIT Use to check blood sugar up to 2 times a day 1 kit 0   cetirizine (ZYRTEC) 10 MG tablet Take 10 mg by mouth at bedtime as needed for allergies.     CINNAMON PO Take 1 tablet by mouth daily.     Continuous Blood Gluc Sensor (FREESTYLE LIBRE 2 SENSOR) MISC Apply sensor every 14 days to monitor sugar continously 2 each 5   Dulaglutide (TRULICITY) 6.30 ZS/0.1UX SOPN Inject 0.75 mg into the skin once a week. 6 mL 3   gabapentin (NEURONTIN) 100 MG capsule Take 100 mg by mouth 3 (three) times daily as needed.     glipiZIDE (  GLUCOTROL) 5 MG tablet Take 2 tablets (10 mg total) by mouth 2 (two) times daily before a meal. 360 tablet 0   glucose blood (ONETOUCH VERIO) test strip Use to check blood sugar up to 2 times a day 100 each 5   Lancets (ONETOUCH DELICA PLUS [id number]) MISC Use to check blood sugar up to 2 times a day 100 each 5   letrozole (FEMARA) 2.5 MG tablet Take 1 tablet (2.5 mg total) by mouth daily. 90 tablet 3   levothyroxine (SYNTHROID) 88 MCG tablet TAKE 1 TABLET BY MOUTH EVERY DAY 90 tablet 3   losartan (COZAAR) 100 MG tablet TAKE 1 TABLET BY MOUTH EVERY DAY 90 tablet 0   Omega-3 Fatty Acids (FISH OIL OMEGA-3 PO) Take 1 capsule by mouth daily.     rosuvastatin (CRESTOR) 20 MG tablet TAKE 1 TABLET BY MOUTH EVERY DAY 90 tablet 3    tiZANidine (ZANAFLEX) 4 MG tablet TAKE 1 TABLET BY MOUTH AT BEDTIME AS NEEDED FOR MUSCLE SPASMS. 90 tablet 0   Turmeric (QC TUMERIC COMPLEX PO) Take 1 tablet by mouth daily.     vitamin B-12 (CYANOCOBALAMIN) 1000 MCG tablet Take 1,000 mcg by mouth daily.     VITAMIN D PO Take 2 each by mouth daily.     No facility-administered medications prior to visit.     Per HPI unless specifically indicated in ROS section below Review of Systems  Constitutional:  Negative for fatigue and fever.  HENT:  Negative for congestion.   Eyes:  Negative for pain.  Respiratory:  Negative for cough and shortness of breath.   Cardiovascular:  Negative for chest pain, palpitations and leg swelling.  Gastrointestinal:  Negative for abdominal pain.  Genitourinary:  Negative for dysuria and vaginal bleeding.  Musculoskeletal:  Positive for back pain.  Neurological:  Negative for syncope, light-headedness and headaches.  Psychiatric/Behavioral:  Negative for dysphoric mood.    Objective:  BP 124/80 (BP Location: Left Arm, Patient Position: Sitting, Cuff Size: Normal)   Pulse 76   Temp 98.1 F (36.7 C) (Temporal)   Ht 5' 2" (1.575 m)   Wt 148 lb (67.1 kg)   BMI 27.07 kg/m   Wt Readings from Last 3 Encounters:  03/16/22 148 lb (67.1 kg)  02/26/22 149 lb 4 oz (67.7 kg)  02/05/22 149 lb 11.2 oz (67.9 kg)      Physical Exam Constitutional:      General: She is not in acute distress.    Appearance: Normal appearance. She is well-developed. She is not ill-appearing or toxic-appearing.  HENT:     Head: Normocephalic.     Right Ear: Hearing, tympanic membrane, ear canal and external ear normal. Tympanic membrane is not erythematous, retracted or bulging.     Left Ear: Hearing, tympanic membrane, ear canal and external ear normal. Tympanic membrane is not erythematous, retracted or bulging.     Nose: No mucosal edema or rhinorrhea.     Right Sinus: No maxillary sinus tenderness or frontal sinus tenderness.      Left Sinus: No maxillary sinus tenderness or frontal sinus tenderness.     Mouth/Throat:     Pharynx: Uvula midline.  Eyes:     General: Lids are normal. Lids are everted, no foreign bodies appreciated.     Conjunctiva/sclera: Conjunctivae normal.     Pupils: Pupils are equal, round, and reactive to light.  Neck:     Thyroid: No thyroid mass or thyromegaly.     Vascular: No carotid  bruit.     Trachea: Trachea normal.  Cardiovascular:     Rate and Rhythm: Normal rate and regular rhythm.     Pulses: Normal pulses.     Heart sounds: Normal heart sounds, S1 normal and S2 normal. No murmur heard.    No friction rub. No gallop.  Pulmonary:     Effort: Pulmonary effort is normal. No tachypnea or respiratory distress.     Breath sounds: Normal breath sounds. No decreased breath sounds, wheezing, rhonchi or rales.  Abdominal:     General: Bowel sounds are normal.     Palpations: Abdomen is soft.     Tenderness: There is no abdominal tenderness.  Musculoskeletal:     Cervical back: Normal range of motion and neck supple. Spasms, tenderness and bony tenderness present. Pain with movement present. Normal range of motion.  Skin:    General: Skin is warm and dry.     Findings: No Shiraishi.  Neurological:     Mental Status: She is alert.  Psychiatric:        Mood and Affect: Mood is not anxious or depressed.        Speech: Speech normal.        Behavior: Behavior normal. Behavior is cooperative.        Thought Content: Thought content normal.        Judgment: Judgment normal.       Results for orders placed or performed in visit on 12/22/21  HM DIABETES EYE EXAM  Result Value Ref Range   HM Diabetic Eye Exam Retinopathy (A) No Retinopathy     COVID 19 screen:  No recent travel or known exposure to COVID19 The patient denies respiratory symptoms of COVID 19 at this time. The importance of social distancing was discussed today.   Assessment and Plan Problem List Items Addressed This  Visit     Chronic insomnia   Hyperlipidemia associated with type 2 diabetes mellitus ([place])   Hypertension associated with diabetes ([place])    Chronic, improved control  Continue amlodipine 5 mg p.o. daily and losartan 100 mg p.o. daily      Other fatigue    Chronic fatigue, likely multifactorial  In part due to last year breast cancer treatment including surgery, radiation as well as current letrozole treatment. We will reevaluate with labs to check for anemia, B12 and vitamin D deficiency although she is taking supplements. Thyroid control in last 3 months has been normal on levothyroxine. She may also in part be deconditioned given her relative inactivity during breast cancer treatment.  I have encouraged her to work on regular exercise and gradually increase her endurance. She also notes she sleeps poorly at night and snores.  If evaluation is negative with lab work we can consider sleep study. I offered her medication to help with sleep but she has had side effects to both Ambien and trazodone in the past and is not interested in any medication at this time.      Relevant Orders   VITAMIN D 25 Hydroxy (Vit-D Deficiency, Fractures)   Vitamin B12   CBC with Differential/Platelet   Hemoglobin A1c   Type 2 diabetes mellitus with nephropathy (HCC) - Primary    Chronic, fluctuating control.  Attempted point-of-care A1c in the office but there is a malfunction multiple times.  We will obtain a blood draw in the lab for A1c today.  She is tolerating Trulicity 7.12 mg weekly.  She is now on glipizide 10 mg p.o.  twice daily. On reviewing her continuous blood sugar monitor we noted that she seems to spike blood sugars after breakfast.  We discussed in detail her breakfast items.  We noted that after eating a lower carb breakfast she had less of a spike.  Instructional time was spent with patient discussing low glycemic options.  Information packet was given.  She will talk with her daughter who  is the home cook about lower carb options for her at dinner.  I offered referral to a nutritionist for her and her daughter but she is not interested.  We also discussed potentially increasing the Trulicity but she is not interested at this time.      Upper back pain on right side    Acute  Most likely musculoskeletal strain given no fall or change in activity.  Less likely side effect to letrozole.  She will start with upper back stretching, information given.  She will follow-up if is not improving as expected      Orders Placed This Encounter  Procedures   VITAMIN D 25 Hydroxy (Vit-D Deficiency, Fractures)   Vitamin B12   CBC with Differential/Platelet   Hemoglobin A1c       Eliezer Lofts, MD

## 2022-03-19 ENCOUNTER — Encounter: Payer: Self-pay | Admitting: Family Medicine

## 2022-03-19 ENCOUNTER — Other Ambulatory Visit: Payer: Self-pay | Admitting: Family Medicine

## 2022-03-22 ENCOUNTER — Ambulatory Visit: Payer: PPO

## 2022-03-22 DIAGNOSIS — Z170 Estrogen receptor positive status [ER+]: Secondary | ICD-10-CM

## 2022-03-22 DIAGNOSIS — R293 Abnormal posture: Secondary | ICD-10-CM

## 2022-03-22 DIAGNOSIS — Z483 Aftercare following surgery for neoplasm: Secondary | ICD-10-CM | POA: Diagnosis not present

## 2022-03-22 DIAGNOSIS — I890 Lymphedema, not elsewhere classified: Secondary | ICD-10-CM

## 2022-03-22 NOTE — Therapy (Signed)
OUTPATIENT PHYSICAL THERAPY TREATMENT NOTE   Patient Name: April Ross MRN: [id number] DOB:Mar 19, 1951, 71 y.o., female Today's Date: 03/22/2022  PCP: Dr. Eliezer Ross REFERRING PROVIDER: Dr. Lindi Ross   PT End of Session - 03/22/22 0828     Visit Number 18    Number of Visits 20    Date for PT Re-Evaluation 04/10/22    PT Start Time 0800    PT Stop Time 7989    PT Time Calculation (min) 57 min    Activity Tolerance Patient tolerated treatment well    Behavior During Therapy [facility] for tasks assessed/performed               Past Medical History:  Diagnosis Date   Allergy    Arthritis    CRD (chronic renal disease)    stage 3   Depression    Diabetes mellitus without complication (HCC)    GERD (gastroesophageal reflux disease)    Hyperlipidemia    Hypertension    LBBB (left bundle branch block)    a. diagnosed in 10/2017 - echo showed a preserved EF of 55-60% and NST was low-risk showing no evidence of ischemia.    Osteoporosis    polyps in colon    Thyroid disease    Urinary tract infection    Past Surgical History:  Procedure Laterality Date   ABDOMINAL HYSTERECTOMY     APPENDECTOMY     BREAST BIOPSY Right    BREAST LUMPECTOMY Right 07/2021   BREAST LUMPECTOMY WITH RADIOACTIVE SEED AND SENTINEL LYMPH NODE BIOPSY Right 07/24/2021   Procedure: RIGHT BREAST LUMPECTOMY WITH RADIOACTIVE SEED AND SENTINEL LYMPH NODE BIOPSY;  Surgeon: April Kussmaul, MD;  Location: [place];  Service: General;  Laterality: Right;   Patient Active Problem List   Diagnosis Date Noted   Other fatigue 03/16/2022   Chronic tension-type headache, not intractable 07/25/2021   Carcinoma of upper-inner quadrant of right breast in female, estrogen receptor positive ([place]) 06/15/2021   Diabetic retinopathy ([place]) 11/24/2020   Chronic bilateral thoracic back pain 11/21/2018   Upper back pain on right side 09/26/2018   Type 2 diabetes mellitus with nephropathy ([place]) 10/27/2017    Tobacco abuse 10/27/2017   Nonspecific chest pain    Osteoporosis 05/24/2016   CKD (chronic kidney disease) stage 3, GFR 30-59 ml/min (HCC) 05/08/2016   Hyperlipidemia associated with type 2 diabetes mellitus ([place]) 01/12/2016   Hypothyroidism 01/12/2016   Hypertension associated with diabetes ([place]) 01/12/2016   Allergic rhinitis 01/12/2016   Moderate major depression, single episode ([place]) 01/12/2016   Chronic insomnia 01/12/2016   GERD (gastroesophageal reflux disease) 01/12/2016    REFERRING DIAG: right breast cancer  THERAPY DIAG:  Aftercare following surgery for neoplasm   Carcinoma of upper-inner quadrant of right breast in female, estrogen receptor positive ([place])   Abnormal posture  PERTINENT HISTORY: Pt will be had a Rt lumpectomy with 2/11 lymph nodes positive on 07/24/21.Had infection and drainage just proximal to incision site during radiatio Done with radiation 2/7/20923 . No chemotherapy and now on antiestrogen.   Pt has an increase in SOZO and so is now wearing a sleeve for 4 weeks  other history includes: DM, stage 3 CKD, osteoporosis   PRECAUTIONS: at risk for lymphedema  SUBJECTIVE:  I need to order the nighttime garment. Bandaging is driving me crazy at night.    PAIN:  Are you having pain? No    Today's treatment :  03/22/22: Self Care:  Pt comes in  reporting she would like to order a circaid profile. She is willing to pay OOP as her insurance doesn't cover. Measured pt and she fits into a size II, regular length and wants a a grey cover. Emailed April Ross with compression [employer] to assist pt with completing this order to receive 15% off.  Manual Therapy:  Compression bandaging: Pt reports her finger bandages were sliding off so reviewed this with her. She was not properly covering digits with multiple revolutions so after demonstration pt was able to correctly apply elastomull to fingers.  MLD in supine: short neck, superficial and deep abdominals, Lt  axillary nodes and anterior interaxillary anastamosis, right inguinal nodes and Rt axillo-inguinal anastamosis, right shoulder, upper arm, elbow and lower arm and hand with return along pathways   03/20/22 addendum: pt messaged saying she would rather just do the night garment.  [name] and she has no coverage so I messaged pt regarding US showing her what to order online vs emailing the instructions to her.    03/13/22 Discussed how pt has seemed to develop lymphedema now post radiation and SLNB as SOZO continues to stay in the high yellow and red.  Pt has been wearing her sleeve and demonstrates no change to slight decrease in circumferential measurements taken today compared to March.  Pt notices the arm swollen occasionally but overall does not have much problem with edema.  Discussed how that the hope is it will not get any worse as it was caught so early.  Demonstrated bandaging with handout on bandage care and links for [facility] self bandaging and caregiver bandaging your arm.  Applied stockinette, fingers 1-4, artiflex from hand to axilla, 1-6cm, 1-8cm and 1-10cm short stretch bandage from hand to axilla with demo, vcs, and hints on application.  Pt was instructed to practice application for now and not attempt sleeping in the bandage until feeling comfortable.  Discussed how bandaging is a good skill to possess for reduction even if she does not have to bandage every night or ends up getting a night garment.    12/28/21 Repeat SOZO:  Continues to stay elevated.  Ordered juzo dynamic size 32mx sleeve and size small gauntlet to increase containment.  Discussed how pt is doing everything possible to keep lymphedema at bJulianand that it may still not progress to chronic.   MT: Reviewed self MLD in seated with pt doing all steps very well.  No vcs for modification needed Then PT performed in supine: short neck, superficial and deep abdominals, bil axillary nodes and anterior interaxillary  anastamosis, right  inguinal nodes and axillo-inguinal anastamosis, right shoulder, upper arm, elbow and lower arm and hand with return along pathways     PATIENT EDUCATION: Education details: self bandaging    HOME EXERCISE PROGRAM:  Plan - 11/20/21 01950      Clinical Impression Statement Reductions continue to be noted in her Rt UE as no palpable or visible lymphedema noted today. Assisted pt with finding size of circaid profile to order and then issued info for her to call to get 15% off discount. Then reviewed finger bandages and contd with MLD to Rt UE.     Personal Factors and Comorbidities Age;Comorbidity 3+     Comorbidities DM, osteoporosis, CKD     Stability/Clinical Decision Making Stable/Uncomplicated     Clinical Decision Making Low     Rehab Potential Excellent     PT Frequency 1x / week     PT Duration  Up to 4 weeks     PT Treatment/Interventions ADLs/Self Care Home Management;Patient/family education;Therapeutic exercise;Manual techniques     PT Next Visit Plan How was bandaging fingers? Get circaid profile ordered?, review and visits as needed    PT Home Exercise Plan Access Code: [email or web address] and post op supine, self bandaging, MLD, daily compression    Consulted and Agree with Plan of Care Patient      Dest[place], [place], PTA 03/22/2022, 9:00 AM

## 2022-03-29 ENCOUNTER — Ambulatory Visit: Payer: PPO

## 2022-03-29 VITALS — Wt 148.0 lb

## 2022-03-29 DIAGNOSIS — R293 Abnormal posture: Secondary | ICD-10-CM

## 2022-03-29 DIAGNOSIS — Z170 Estrogen receptor positive status [ER+]: Secondary | ICD-10-CM

## 2022-03-29 DIAGNOSIS — C50211 Malignant neoplasm of upper-inner quadrant of right female breast: Secondary | ICD-10-CM

## 2022-03-29 DIAGNOSIS — I890 Lymphedema, not elsewhere classified: Secondary | ICD-10-CM

## 2022-03-29 DIAGNOSIS — Z483 Aftercare following surgery for neoplasm: Secondary | ICD-10-CM | POA: Diagnosis not present

## 2022-03-29 NOTE — Therapy (Signed)
OUTPATIENT PHYSICAL THERAPY TREATMENT NOTE   Patient Name: April Ross MRN: [id number] DOB:Jun 10, 1951, 71 y.o., female Today's Date: 03/29/2022  PCP: Dr. Eliezer Lofts REFERRING PROVIDER: Dr. Lindi Adie   PT End of Session - 03/29/22 0809     Visit Number 19    Number of Visits 20    Date for PT Re-Evaluation 04/10/22    PT Start Time 0801    PT Stop Time 5027    PT Time Calculation (min) 54 min    Activity Tolerance Patient tolerated treatment well    Behavior During Therapy [facility] for tasks assessed/performed               Past Medical History:  Diagnosis Date   Allergy    Arthritis    CRD (chronic renal disease)    stage 3   Depression    Diabetes mellitus without complication (HCC)    GERD (gastroesophageal reflux disease)    Hyperlipidemia    Hypertension    LBBB (left bundle branch block)    a. diagnosed in 10/2017 - echo showed a preserved EF of 55-60% and NST was low-risk showing no evidence of ischemia.    Osteoporosis    polyps in colon    Thyroid disease    Urinary tract infection    Past Surgical History:  Procedure Laterality Date   ABDOMINAL HYSTERECTOMY     APPENDECTOMY     BREAST BIOPSY Right    BREAST LUMPECTOMY Right 07/2021   BREAST LUMPECTOMY WITH RADIOACTIVE SEED AND SENTINEL LYMPH NODE BIOPSY Right 07/24/2021   Procedure: RIGHT BREAST LUMPECTOMY WITH RADIOACTIVE SEED AND SENTINEL LYMPH NODE BIOPSY;  Surgeon: Jovita Kussmaul, MD;  Location: [facility];  Service: General;  Laterality: Right;   Patient Active Problem List   Diagnosis Date Noted   Other fatigue 03/16/2022   Chronic tension-type headache, not intractable 07/25/2021   Carcinoma of upper-inner quadrant of right breast in female, estrogen receptor positive ([place]) 06/15/2021   Diabetic retinopathy ([place]) 11/24/2020   Chronic bilateral thoracic back pain 11/21/2018   Upper back pain on right side 09/26/2018   Type 2 diabetes mellitus with nephropathy ([place]) 10/27/2017    Tobacco abuse 10/27/2017   Nonspecific chest pain    Osteoporosis 05/24/2016   CKD (chronic kidney disease) stage 3, GFR 30-59 ml/min (HCC) 05/08/2016   Hyperlipidemia associated with type 2 diabetes mellitus ([place]) 01/12/2016   Hypothyroidism 01/12/2016   Hypertension associated with diabetes ([place]) 01/12/2016   Allergic rhinitis 01/12/2016   Moderate major depression, single episode ([place]) 01/12/2016   Chronic insomnia 01/12/2016   GERD (gastroesophageal reflux disease) 01/12/2016    REFERRING DIAG: right breast cancer  THERAPY DIAG:  Aftercare following surgery for neoplasm   Carcinoma of upper-inner quadrant of right breast in female, estrogen receptor positive ([place])   Abnormal posture  PERTINENT HISTORY: Pt will be had a Rt lumpectomy with 2/11 lymph nodes positive on 07/24/21.Had infection and drainage just proximal to incision site during radiatio Done with radiation 2/7/20923 . No chemotherapy and now on antiestrogen.   Pt has an increase in SOZO and so is now wearing a sleeve for 4 weeks  other history includes: DM, stage 3 CKD, osteoporosis   PRECAUTIONS: at risk for lymphedema  SUBJECTIVE:  I got the Circaid Profile (nighttime garment) and I love it! It's so much easier and more comfortable than the bandaging.    PAIN:  Are you having pain? No       Today's  treatment :  03/29/22: Self Care Spent a few mins at beginning of session assessing how pt is doing with new Circaid Profile. She reports this is very comfortable and she notes chip foam pattern on her arm when she wakes in the morning. Explained to her that this means garment fits well and that her lymphatic fluid is moving through the foam channels while she sleeps. Also answered pts questions about, at times, wanting to go without her daytime sleeve. Educated her that now that she has lymphedema we know it will progress if not maintained with compression but there may be times she can go short periods of time  without her sleeve (I.e. going to the pool with her daughter for a few hours) but then plan to perform self MLD that day and maybe do a longer session. Pt able to verbalize good understanding of this.  Manual Therapy Circumference Measurements retaken and good reductions or maintained reductions noted (see screenshot image above).  MLD in supine: short neck, superficial and deep abdominals (educated pt in abd sequences today and issued handout for this), Lt axillary nodes and anterior interaxillary anastamosis, right inguinal nodes and Rt axillo-inguinal anastamosis, right shoulder, upper arm, elbow and lower arm and hand with return along pathways  SOZO reassessed and pts change from baseline, though still elevated due to having lymphedema, did reduce since last measured and pt was very pleased about this to know her hard work at being consistent was paying off.   03/22/22: Self Care:  Pt comes in reporting she would like to order a circaid profile. She is willing to pay OOP as her insurance doesn't cover. Measured pt and she fits into a size II, regular length and wants a a grey cover. Emailed Mariane Duval with compression [employer] to assist pt with completing this order to receive 15% off.  Manual Therapy:  Compression bandaging: Pt reports her finger bandages were sliding off so reviewed this with her. She was not properly covering digits with multiple revolutions so after demonstration pt was able to correctly apply elastomull to fingers.  MLD in supine: short neck, superficial and deep abdominals, Lt axillary nodes and anterior interaxillary anastamosis, right inguinal nodes and Rt axillo-inguinal anastamosis, right shoulder, upper arm, elbow and lower arm and hand with return along pathways   03/20/22 addendum: pt messaged saying she would rather just do the night garment.  [name] and she has no coverage so I messaged pt regarding US showing her what to order online vs emailing the  instructions to her.    03/13/22 Discussed how pt has seemed to develop lymphedema now post radiation and SLNB as SOZO continues to stay in the high yellow and red.  Pt has been wearing her sleeve and demonstrates no change to slight decrease in circumferential measurements taken today compared to March.  Pt notices the arm swollen occasionally but overall does not have much problem with edema.  Discussed how that the hope is it will not get any worse as it was caught so early.  Demonstrated bandaging with handout on bandage care and links for [facility] self bandaging and caregiver bandaging your arm.  Applied stockinette, fingers 1-4, artiflex from hand to axilla, 1-6cm, 1-8cm and 1-10cm short stretch bandage from hand to axilla with demo, vcs, and hints on application.  Pt was instructed to practice application for now and not attempt sleeping in the bandage until feeling comfortable.  Discussed how bandaging is a good skill to possess for  reduction even if she does not have to bandage every night or ends up getting a night garment.       PATIENT EDUCATION: Education details: Superficial and deep abdominals with handout and had pt return demo   [place]:  Plan - 11/20/21 5631       Clinical Impression Statement Pt has received her new Circaid Profile nighttime garment. She is very pleased with the fit and comfort of this and is happy to wear this in lieu of bandages. Her circumference measurements today were all reduced or maintained and her SOZO had reduced as well from last time. She still has an elevated baseline which she understands means she has lymphedema, but she was very encouraged to know her hard wok at being compliant in the Maintenance Phase of her treatment is working. Pt will be placed on hold for now and plans to call for a SOZO screen in a few months as she would like to be monitored and know she is cont to maintain her symptoms.     Personal Factors and  Comorbidities Age;Comorbidity 3+     Comorbidities DM, osteoporosis, CKD     Stability/Clinical Decision Making Stable/Uncomplicated     Clinical Decision Making Low     Rehab Potential Excellent     PT Frequency 1x / week     PT Duration Up to 4 weeks     PT Treatment/Interventions ADLs/Self Care Home Management;Patient/family education;Therapeutic exercise;Manual techniques     PT Next Visit Plan Pt on hold from active treatment and will call for a SOZO in a few months.     PT Home Exercise Plan Access Code: [email or web address] and post op supine, self bandaging, MLD, daily compression    Consulted and Agree with Plan of Care Patient      [place], [place], PTA 03/29/2022, 8:10 AM

## 2022-04-13 ENCOUNTER — Telehealth: Payer: Self-pay

## 2022-04-13 NOTE — Telephone Encounter (Signed)
[facility] Night - Client Nonclinical Telephone Record  [facility] [place] Night - Client Client Site [place] - Night Provider Eliezer Lofts - MD Contact Type Call Who Is Calling Patient / Member / Family / Caregiver Caller Name Dynasti Kerman Phone Number [phone] Patient Name April Ross Patient DOB [birth year] Call Type Message Only Information Provided Reason for Call Billing Question Initial Comment Caller states she has questions regarding billing. Additional Comment hrs prov Disp. Time Disposition Final User 04/12/2022 5:16:05 PM General Information Provided Yes Idolina Primer Call Closed By: Idolina Primer Transaction Date/Time: 04/12/2022 5:14:17 PM (ET   I spoke with pt and gave pt [facility] billing at [phone] and [phone] and pt voiced understanding and has a question about a bill she received yesterday. Pt will contact billing.

## 2022-05-08 ENCOUNTER — Other Ambulatory Visit: Payer: Self-pay | Admitting: Family Medicine

## 2022-05-08 ENCOUNTER — Telehealth: Payer: Self-pay

## 2022-05-08 DIAGNOSIS — E1121 Type 2 diabetes mellitus with diabetic nephropathy: Secondary | ICD-10-CM

## 2022-05-08 NOTE — Chronic Care Management (AMB) (Signed)
Chronic Care Management Pharmacy Assistant   Name: April Ross Head  MRN: [id number] DOB: 1951/06/28  Reason for Encounter: Diabetes Disease State  Recent office visits:  03/16/22-[name],MD(PCP)- f/u DM,labs(Hemoglobin A1c is continuing to improve on your current regimen.There is no clear sign of infection per the CBC.  There is no anemia. B12 and vitamin D are both in the normal range. F/u 3 months  Recent consult visits:  02/26/22-Paul Toth,MD(gen surg)-f/u breast CA,She will continue to use topical aloe and take gabapentin for the sensitivity in the right axilla.F/u 3 months  Hospital visits:  None in previous 6 months  Medications: Outpatient Encounter Medications as of 05/08/2022  Medication Sig   Alcohol Swabs (B-D SINGLE USE SWABS REGULAR) PADS Use to check blood sugar up to 2 times a day   amLODipine (NORVASC) 5 MG tablet TAKE 1 TABLET (5 MG TOTAL) BY MOUTH DAILY.   Blood Glucose Monitoring Suppl (ONETOUCH VERIO REFLECT) w/Device KIT Use to check blood sugar up to 2 times a day   cetirizine (ZYRTEC) 10 MG tablet Take 10 mg by mouth at bedtime as needed for allergies.   CINNAMON PO Take 1 tablet by mouth daily.   Continuous Blood Gluc Sensor (FREESTYLE LIBRE 2 SENSOR) MISC Apply sensor every 14 days to monitor sugar continously   Dulaglutide (TRULICITY) 2.33 AQ/7.6AU SOPN Inject 0.75 mg into the skin once a week.   gabapentin (NEURONTIN) 100 MG capsule Take 100 mg by mouth 3 (three) times daily as needed.   glipiZIDE (GLUCOTROL) 5 MG tablet Take 2 tablets (10 mg total) by mouth 2 (two) times daily before a meal.   glucose blood (ONETOUCH VERIO) test strip Use to check blood sugar up to 2 times a day   Lancets (ONETOUCH DELICA PLUS [id number]) MISC Use to check blood sugar up to 2 times a day   letrozole (FEMARA) 2.5 MG tablet Take 1 tablet (2.5 mg total) by mouth daily.   levothyroxine (SYNTHROID) 88 MCG tablet TAKE 1 TABLET BY MOUTH EVERY DAY   losartan (COZAAR) 100 MG  tablet TAKE 1 TABLET BY MOUTH EVERY DAY   Omega-3 Fatty Acids (FISH OIL OMEGA-3 PO) Take 1 capsule by mouth daily.   rosuvastatin (CRESTOR) 20 MG tablet TAKE 1 TABLET BY MOUTH EVERY DAY   tiZANidine (ZANAFLEX) 4 MG tablet TAKE 1 TABLET BY MOUTH AT BEDTIME AS NEEDED FOR MUSCLE SPASMS.   Turmeric (QC TUMERIC COMPLEX PO) Take 1 tablet by mouth daily.   vitamin B-12 (CYANOCOBALAMIN) 1000 MCG tablet Take 1,000 mcg by mouth daily.   VITAMIN D PO Take 2 each by mouth daily.   No facility-administered encounter medications on file as of 05/08/2022.      Recent Relevant Labs: Lab Results  Component Value Date/Time   HGBA1C 7.0 (H) 03/16/2022 09:27 AM   HGBA1C 7.8 (H) 12/08/2021 07:30 AM   MICROALBUR 5.0 (H) 11/19/2017 10:18 AM    Kidney Function Lab Results  Component Value Date/Time   CREATININE 1.35 (H) 12/08/2021 07:30 AM   CREATININE 1.45 (H) 07/21/2021 12:23 PM   GFR 39.18 (L) 12/08/2021 07:30 AM   GFRNONAA 38 (L) 07/21/2021 12:23 PM   GFRAA 37 (L) 04/12/2020 10:20 PM     Contacted patient on 05/15/22 to discuss diabetes disease state.   Current antihyperglycemic regimen:  Glipizide 26m -take 2 tablets  BID Trulicity 05.63mg weekly Freestyle Libre 2    Patient verbally confirms she is taking the above medications as directed. Yes  What  diet changes have been made to improve diabetes control? For breakfast , low carb high protein.  What recent interventions/DTPs have been made to improve glycemic control:      Change Glyburide to Glipizide 5 mg BID (GFR < 60);-tolerating well  Have there been any recent hospitalizations or ED visits since last visit with CPP? No  Patient denies hypoglycemic symptoms, including Pale, Sweaty, Shaky, Hungry, Nervous/irritable, and Vision changes  Patient denies hyperglycemic symptoms, including blurry vision, excessive thirst, fatigue, polyuria, and weakness  How often are you checking your blood sugar?  Freestyle Libre system  What are your  blood sugars ranging?  Fasting:  averaging 120 - 130 After meals: averaging -260   During the week, how often does your blood glucose drop below 70?  2 times during the middle of the night - 55 The 15-15 rule for low sugars: If sugar reading below 70, have 15 grams of carbohydrate to raise your blood sugar and check it after 15 minutes. If it's still below 70 mg/dL, have another serving. 15 grams of carbs may be: -Glucose tablets (see instructions) -Gel tube (see instructions) -4 ounces (1/2 cup) of juice or regular soda (not diet) -1 tablespoon of sugar, honey, or corn syrup -Hard candies, jellybeans or gumdrops--see food label for how many to consume  Repeat these steps until your blood sugar is at least 70 mg/dL. Once your blood sugar is back to normal, eat a meal or snack to make sure it doesn't lower again.    Are you checking your feet daily/regularly? Yes  Adherence Review: Is the patient currently on a STATIN medication? Yes Is the patient currently on ACE/ARB medication? Yes Does the patient have >5 day gap between last estimated fill dates? No  Care Gaps: Annual wellness visit in last year? Yes Most recent A1C reading:7.0   03/16/22 Most Recent BP reading:124/80  76-P  03/16/22  Last eye exam / retinopathy screening:UTD Last diabetic foot exam:UTD  Counseled patient on importance of annual eye and foot exam.   Star Rating Drugs:  Medication:  Last Fill: Day Supply Trulicity  04/09/72  84 Glipizide 67m  02/16/22 90 Losartan 1082m5/10/23 90 Rosuvastatin 2075m/12/23 90   CCM appointment on 05/18/22 - Reminded      PCP 06/21/22  LinCharlene BrookePP notified  VelAvel [phone]

## 2022-05-17 ENCOUNTER — Ambulatory Visit
Admission: RE | Admit: 2022-05-17 | Discharge: 2022-05-17 | Disposition: A | Discharge status: Home / Self Care | Payer: HMO | Source: Ambulatory Visit | Attending: Adult Health | Admitting: Adult Health

## 2022-05-17 DIAGNOSIS — Z853 Personal history of malignant neoplasm of breast: Secondary | ICD-10-CM | POA: Diagnosis not present

## 2022-05-17 DIAGNOSIS — Z170 Estrogen receptor positive status [ER+]: Secondary | ICD-10-CM

## 2022-05-17 DIAGNOSIS — R922 Inconclusive mammogram: Secondary | ICD-10-CM | POA: Diagnosis not present

## 2022-05-18 ENCOUNTER — Ambulatory Visit (INDEPENDENT_AMBULATORY_CARE_PROVIDER_SITE_OTHER): Payer: HMO | Admitting: Pharmacist

## 2022-05-18 DIAGNOSIS — E785 Hyperlipidemia, unspecified: Secondary | ICD-10-CM

## 2022-05-18 DIAGNOSIS — E1159 Type 2 diabetes mellitus with other circulatory complications: Secondary | ICD-10-CM

## 2022-05-18 DIAGNOSIS — N1832 Chronic kidney disease, stage 3b: Secondary | ICD-10-CM

## 2022-05-18 DIAGNOSIS — E1121 Type 2 diabetes mellitus with diabetic nephropathy: Secondary | ICD-10-CM

## 2022-05-18 NOTE — Progress Notes (Signed)
Chronic Care Management Pharmacy Note  05/18/2022 Name:  April Ross MRN:  [id number] DOB:  November 30, 1950  Summary: CCM F/U visit -DM: A1c 7.0% (03/2022); pt is wearing Freestyle Libre, per AGP report DM control is improving: time-in-range 83% and GMI 6.8% -Pt reports fatigue/sleep is somewhat improved with addition of Zquil ZZZs (melatonin plus herbal blend)  Recommendations: -No med changes  Plan: -Tyler [facility] will call patient 3 months for DM update -Pharmacist follow up televisit scheduled for 6 months -PCP F/U 06/21/22    Subjective: April Ross is an 71 y.o. year old female who is a primary patient of Bedsole, Amy E, MD.  The CCM team was consulted for assistance with disease management and care coordination needs.    Engaged with patient by telephone for follow up visit in response to provider referral for pharmacy case management and/or care coordination services.   Consent to Services:  The patient was given information about Chronic Care Management services, agreed to services, and gave verbal consent prior to initiation of services.  Please see initial visit note for detailed documentation.   Patient Care Team: Jinny Sanders, MD as PCP - General (Family Medicine) Richard Miu, DMD as Consulting Physician (Dentistry) Nicholas Lose, MD as Consulting Physician (Hematology and Oncology) Charlton Haws, [facility] as Pharmacist (Pharmacist) Jovita Kussmaul, MD as Consulting Physician (General Surgery) Kyung Rudd, MD as Consulting Physician (Radiation Oncology)  Recent office visits: 03/16/22 Dr Diona Browner OV: f/u DM - A1c 7.0%; work up fatigue - labwork WNL. Conside sleep study. Discussed CGM - spikes after breakfast. Offered nutrition referral, pt declined. Offered increase Trulicity, pt declined.  12/15/21-PCP- [name],MD- AWV.Discussed screenings,vaccines, counseled on smoking cessation-Follow BP at home. call if > 140/90.  Recent consult visits: 02/26/22  Dr Marlou Starks (Gen Surgery): continue letrozole 12/14/21- Physical Therapy- multiple visits -Right breast cancer 11/28/21-[facility]- post op right breast lumpectomy and biopsy.No medication changes  11/07/21-[facility]- follow up after radiation-continue oral antibiotics for cellulitis right axilla. 11/06/21-Oncology-Vinay Gudena,MD-Follow up breast cancer right.discussed treatment plan-radiation 11/01/21-[facility],MD- follow up breast cancer, continue oral antibiotics  and monitor 08/25/21-Oncology-Govinda Brahmanday,MD-Follow up breast cancer pathology and treatment plan. 07/24/21-[facility] Daiva Nakayama, MD- Right breast lumpectomy-start Tramadol 58m take 1 tablet every 6 hours for pain   Hospital visits: None in previous 6 months   Objective:  Lab Results  Component Value Date   CREATININE 1.35 (H) 12/08/2021   BUN 17 12/08/2021   GFR 39.18 (L) 12/08/2021   GFRNONAA 38 (L) 07/21/2021   GFRAA 37 (L) 04/12/2020   NA 139 12/08/2021   K 4.0 12/08/2021   CALCIUM 9.8 12/08/2021   CO2 28 12/08/2021   GLUCOSE 160 (H) 12/08/2021    Lab Results  Component Value Date/Time   HGBA1C 7.0 (H) 03/16/2022 09:27 AM   HGBA1C 7.8 (H) 12/08/2021 07:30 AM   GFR 39.18 (L) 12/08/2021 07:30 AM   GFR 32.56 (L) 06/09/2021 08:03 AM   MICROALBUR 5.0 (H) 11/19/2017 10:18 AM    Last diabetic Eye exam:  Lab Results  Component Value Date/Time   HMDIABEYEEXA Retinopathy (A) 12/20/2021 12:00 AM    Last diabetic Foot exam:  Lab Results  Component Value Date/Time   HMDIABFOOTEX done 06/15/2021 12:00 AM     Lab Results  Component Value Date   CHOL 153 12/08/2021   HDL 30.80 (L) 12/08/2021   LDLCALC 61 10/28/2017   LDLDIRECT 85.0 12/08/2021   TRIG 277.0 (H) 12/08/2021  CHOLHDL 5 12/08/2021       Latest Ref Rng & Units 12/08/2021    7:30 AM 06/09/2021    8:03 AM 02/17/2021    7:41 AM  Hepatic Function  Total Protein 6.0 - 8.3 g/dL 6.5  7.3  7.6    Albumin 3.5 - 5.2 g/dL 4.3  4.8  4.9   AST 0 - 37 U/L 24  23  34   ALT 0 - 35 U/L 25  22  32   Alk Phosphatase 39 - 117 U/L 65  48  64   Total Bilirubin 0.2 - 1.2 mg/dL 0.5  0.4  0.4     Lab Results  Component Value Date/Time   TSH 2.37 12/08/2021 07:30 AM   TSH 2.89 11/18/2020 08:13 AM   FREET4 1.15 12/08/2021 07:30 AM   FREET4 1.22 11/18/2020 08:13 AM       Latest Ref Rng & Units 03/16/2022    9:27 AM 12/01/2020    6:35 PM 09/28/2020    8:31 PM  CBC  WBC 4.0 - 10.5 K/uL 6.2  8.5  8.3   Hemoglobin 12.0 - 15.0 g/dL 13.5  12.8  11.6   Hematocrit 36.0 - 46.0 % 40.7  38.2  36.4   Platelets 150.0 - 400.0 K/uL 197.0  240  223     Lab Results  Component Value Date/Time   VD25OH 70.19 03/16/2022 09:27 AM   VD25OH 49.99 02/18/2018 08:22 AM    Clinical ASCVD: No  The 10-year ASCVD risk score (Arnett DK, et al., 2019) is: 40.7%   Values used to calculate the score:     Age: 58 years     Sex: Female     Is Non-Hispanic African American: No     Diabetic: Yes     Tobacco smoker: Yes     Systolic Blood Pressure: 970 mmHg     Is BP treated: Yes     HDL Cholesterol: 30.8 mg/dL     Total Cholesterol: 153 mg/dL       12/15/2021    9:10 AM 06/15/2021    8:55 AM 02/24/2021    9:48 AM  Depression screen PHQ 2/9  Decreased Interest 2 1 2   Down, Depressed, Hopeless 2 1 2   PHQ - 2 Score 4 2 4   Altered sleeping 3 2 3   Tired, decreased energy 2 2 3   Change in appetite 0 1 1  Feeling bad or failure about yourself  0 1 1  Trouble concentrating 0 0 0  Moving slowly or fidgety/restless 0 0 0  Suicidal thoughts 0 0 0  PHQ-9 Score 9 8 12   Difficult doing work/chores  Not difficult at all Somewhat difficult     Social History   Tobacco Use  Smoking Status Every Day   Packs/day: 1.00   Years: 15.00   Total pack years: 15.00   Types: Cigarettes  Smokeless Tobacco Never   BP Readings from Last 3 Encounters:  03/16/22 124/80  02/05/22 (!) 154/83  12/15/21 (!) 160/80   Pulse  Readings from Last 3 Encounters:  03/16/22 76  02/05/22 83  12/15/21 80   Wt Readings from Last 3 Encounters:  03/29/22 148 lb (67.1 kg)  03/16/22 148 lb (67.1 kg)  02/26/22 149 lb 4 oz (67.7 kg)   BMI Readings from Last 3 Encounters:  03/29/22 27.07 kg/m  03/16/22 27.07 kg/m  02/26/22 27.30 kg/m    Assessment/Interventions: Review of patient past medical history, allergies, medications, health status, including  review of consultants reports, laboratory and other test data, was performed as part of comprehensive evaluation and provision of chronic care management services.   SDOH:  (Social Determinants of Health) assessments and interventions performed: No - done 01/2022   SDOH Screenings   Alcohol Screen: Not on file  Depression (PHQ2-9): Medium Risk (12/15/2021)   Depression (PHQ2-9)    PHQ-2 Score: 9  Financial Resource Strain: Low Risk  (01/16/2022)   Overall Financial Resource Strain (CARDIA)    Difficulty of Paying Living Expenses: Not very hard  Food Insecurity: No Food Insecurity (01/16/2022)   Hunger Vital Sign    Worried About Running Out of Food in the Last Year: Never true    Ran Out of Food in the Last Year: Never true  Housing: Low Risk  (08/18/2021)   Housing    Last Housing Risk Score: 0  Physical Activity: Inactive (08/28/2019)   Exercise Vital Sign    Days of Exercise per Week: 0 days    Minutes of Exercise per Session: 0 min  Social Connections: Not on file  Stress: No Stress Concern Present (08/28/2019)   [place]    Feeling of Stress : Not at all  Tobacco Use: High Risk (03/29/2022)   Patient History    Smoking Tobacco Use: Every Day    Smokeless Tobacco Use: Never    Passive Exposure: Not on file  Transportation Needs: No Transportation Needs (08/18/2021)   PRAPARE - Transportation    Lack of Transportation (Medical): No    Lack of Transportation (Non-Medical): No    CCM  Care Plan  Allergies  Allergen Reactions   Acetaminophen     [place]    Asa [Aspirin]     Selsor groin and axilla area   Ibuprofen    Other [place]    Mycolog-II & HRT Hormone patch    Medications Reviewed Today     Reviewed by Charlton Haws, [facility] (Pharmacist) on 05/18/22 at [place] List Status: <None>   Medication Order Taking? Sig Documenting Provider Last Dose Status Informant  Alcohol Swabs (B-D SINGLE USE SWABS REGULAR) PADS [id number] Yes Use to check blood sugar up to 2 times a day Bedsole, Amy E, MD Taking Active   amLODipine (NORVASC) 5 MG tablet [id number] Yes TAKE 1 TABLET (5 MG TOTAL) BY MOUTH DAILY. Jinny Sanders, MD Taking Active   Blood Glucose Monitoring Suppl (ONETOUCH VERIO REFLECT) w/Device KIT [id number] Yes Use to check blood sugar up to 2 times a day Bedsole, Amy E, MD Taking Active   cetirizine (ZYRTEC) 10 MG tablet [id number] Yes Take 10 mg by mouth at bedtime as needed for allergies. [provider] Taking Active Self  CINNAMON PO [id number] Yes Take 1 tablet by mouth daily. [provider] Taking Active Self  Continuous Blood Gluc Sensor (FREESTYLE LIBRE 2 SENSOR) MISC [id number] Yes Apply sensor every 14 days to monitor sugar continously Bedsole, Amy E, MD Taking Active   Dulaglutide (TRULICITY) 3.23 FT/7.3UK SOPN [id number] Yes Inject 0.75 mg into the skin once a week. Jinny Sanders, MD Taking Active   gabapentin (NEURONTIN) 100 MG capsule [id number] Yes Take 100 mg by mouth 3 (three) times daily as needed. [provider] Taking Active   glipiZIDE (GLUCOTROL) 5 MG tablet [id number] Yes TAKE 2 TABLETS (10 MG TOTAL) BY MOUTH 2 (TWO) TIMES DAILY BEFORE A MEAL. Jinny Sanders, MD Taking Active   glucose blood (ONETOUCH VERIO) test  strip [id number] Yes Use to check blood sugar up to 2 times a day Jinny Sanders, MD Taking Active   Lancets (ONETOUCH DELICA PLUS QZESPQ33A) [place] [id number] Yes Use to check blood sugar up to 2 times a day Jinny Sanders, MD Taking Active   letrozole [facility]) 2.5 MG tablet [id number] Yes Take 1 tablet (2.5 mg total) by mouth daily. Nicholas Lose, MD Taking Active   levothyroxine (SYNTHROID) 88 MCG tablet [id number] Yes TAKE 1 TABLET BY MOUTH EVERY DAY Bedsole, Amy E, MD Taking Active   losartan (COZAAR) 100 MG tablet [id number] Yes TAKE 1 TABLET BY MOUTH EVERY DAY Bedsole, Amy E, MD Taking Active   Omega-3 Fatty Acids (FISH OIL OMEGA-3 PO) [id number] Yes Take 1 capsule by mouth daily. [provider] Taking Active Self  rosuvastatin (CRESTOR) 20 MG tablet [id number] Yes TAKE 1 TABLET BY MOUTH EVERY DAY Bedsole, Amy E, MD Taking Active   tiZANidine (ZANAFLEX) 4 MG tablet [id number] Yes TAKE 1 TABLET BY MOUTH AT BEDTIME AS NEEDED FOR MUSCLE SPASMS. Jinny Sanders, MD Taking Active   Turmeric (QC TUMERIC COMPLEX PO) [id number] Yes Take 1 tablet by mouth daily. [provider] Taking Active Self  vitamin B-12 (CYANOCOBALAMIN) 1000 MCG tablet [id number] Yes Take 1,000 mcg by mouth daily. [provider] Taking Active   VITAMIN D PO [id number] Yes Take 2 each by mouth daily. [provider] Taking Active             Patient Active Problem List   Diagnosis Date Noted   Other fatigue 03/16/2022   Chronic tension-type headache, not intractable 07/25/2021   Carcinoma of upper-inner quadrant of right breast in female, estrogen receptor positive ([place]) 06/15/2021   Diabetic retinopathy ([place]) 11/24/2020   Chronic bilateral thoracic back pain 11/21/2018   Upper back pain on right side 09/26/2018   Type 2 diabetes mellitus with nephropathy ([place]) 10/27/2017   Tobacco abuse 10/27/2017   Nonspecific chest pain    Osteoporosis 05/24/2016   CKD (chronic kidney disease) stage 3, GFR 30-59 ml/min ([place]) 05/08/2016   Hyperlipidemia associated with type 2 diabetes mellitus ([place]) 01/12/2016   Hypothyroidism 01/12/2016   Hypertension associated with diabetes ([place]) 01/12/2016   Allergic rhinitis  01/12/2016   Moderate major depression, single episode ([place]) 01/12/2016   Chronic insomnia 01/12/2016   GERD (gastroesophageal reflux disease) 01/12/2016    Immunization History  Administered Date(s) Administered   Fluad Quad(high Dose 65+) 07/17/2019, 10/03/2020, 06/15/2021   Influenza, High Dose Seasonal PF 07/16/2017, 08/01/2018   Influenza,inj,Quad PF,6+ Mos 06/05/2016   Moderna Sars-Covid-2 Vaccination 07/08/2020, 08/06/2020   PPD Test 12/26/2016   Pneumococcal Conjugate-13 02/25/2014   Pneumococcal Polysaccharide-23 08/04/2009, 05/08/2016   Tdap 06/05/2016   Zoster, Live 08/13/2012    Conditions to be addressed/monitored:  Hypertension, Hyperlipidemia, Diabetes, and Chronic Kidney Disease  Care Plan : [place]  Updates made by Charlton Haws, [place] since 05/18/2022 12:00 AM     Problem: Hypertension, Hyperlipidemia, Diabetes, and Chronic Kidney Disease   Priority: High     Long-Range Goal: Disease mgmt   Start Date: 01/16/2022  Expected End Date: 01/17/2023  This Visit's Progress: On track  Recent Progress: On track  Priority: High  Note:   Current Barriers:  None identified  Pharmacist Clinical Goal(s):  Patient will contact provider office for questions/concerns as evidenced notation of same in electronic health record through collaboration with PharmD and provider.  Interventions: 1:1 collaboration with Jinny Sanders, MD regarding  development and update of comprehensive plan of care as evidenced by provider attestation and co-signature Inter-disciplinary care team collaboration (see longitudinal plan of care) Comprehensive medication review performed; medication list updated in electronic medical record  Hypertension (BP goal <140/90) -Controlled - BP at goal in clinic -Current treatment: Amlodipine 5 mg daily - Appropriate, Effective, Safe, Accessible Losartan 100 mg daily - Appropriate, Effective, Safe, Accessible -Medications previously  tried: n/a  -Educated on BP goals and benefits of medications for prevention of heart attack, stroke and kidney damage; -Counseled to monitor BP at home periodically -Recommended to continue current medication  Hyperlipidemia: (LDL goal < 100) -Controlled - LDL 85 (12/2021) at goal; pt endorses compliance with rosuvastatin, she had bought it outside of insurance once so has extra supply -Current treatment: Rosuvastatin 20 mg daily - Appropriate, Effective, Safe, Accessible -Medications previously tried: n/a  -Educated on Cholesterol goals; Benefits of statin for ASCVD risk reduction; -Recommended to continue current medication  Diabetes (A1c goal <7%) -Improving - A1c 7.0% (03/2022); pt reports compliance with Trulicity and glipizide; she reports 1 episode of CGM sugar 55 overnight, she did not have any symptoms of hypoglycemia at the time - discussed if CGM data does not match symptoms, always check finger stick before taking action -Current home glucose readings:  -Reviewed AGP report: 05/05/22 to 05/18/22. Sensor active: 74%  Time in range (70-180): 83% (goal > 70%)  High (>180): 16%  Low (< 70): 1% (goal < 4%)  GMI: 6.8%; Average glucose: 144  -Previous AGP 02/03/22-02/16/22  TIR 70-180: 78% (target > 70%); high 181-250: 20%; very high >250: 1% average glu 146; GMI 6.8%  -Diet: pt lives with her daughter who prepares most meals and is not consistently following diabetic diet -Current medications: Glipizide 5 mg - 2 tab BID - Appropriate, Effective, Query Safe Trulicity 5.27 mg weekly - Appropriate, Effective, Safe, Accessible Freestyle Libre 2 - Appropriate, Effective, Safe, Accessible -Medications previously tried: Musician (cost), metformin (SE) -Educated on A1c and blood sugar goals -Reviewed CGM goals (TIR > 70%) -Recommend to continue current medication  Breast cancer  -Dx 05/2021, s/p lumpectomy 07/2021, radiation 09/2021-11/2021; follows with oncology (Dr Lindi Adie), planning  letrozole for 7 years -pt c/o of significant fatigue since completing radiation; recently moved letrozole to bedtime per advice from oncology, she has not noticed much difference yet -Current treatment  Letrozole 2.5 mg daily HS - Appropriate, Effective, Safe, Accessible Gabapentin 100 mg TID prn (surgical pain) -Appropriate, Effective, Safe, Accessible Tizanidine 4 mg HS prn -Appropriate, Effective, Safe, Accessible -Medications previously tried: n/a  -Recommended to continue current medication  Fatigue (Goal: improve sx) -Improving - pt reports significant fatigue since completing radiation; she has had labwork per PCP that was WNL; sleep study was considered but pt declined; -Pt did start using Zquil ZZZ's supplement (melatonin, chamomile, lavender)  Chronic Kidney Disease Stage 3b  -All medications assessed for renal dosing and appropriateness in chronic kidney disease. -Recommended to continue current medication   Patient Goals/Self-Care Activities Patient will:  - take medications as prescribed as evidenced by patient report and record review focus on medication adherence by routine check glucose daily, document, and provide at future appointments        Medication Assistance: None required.  Patient affirms current coverage meets needs.  Compliance/Adherence/Medication fill history: Care Gaps: Colonoscopy (due 03/23/20)  Star-Rating Drugs: Glyburide - PDC 100% Losartan - PDC 98% Rosuvastatin - PDC 32%; LF 03/19/22 x 90 ds - pt claims she filled Rx outside insurance and  has extra supply  Medication Access: Within the past 30 days, how often has patient missed a dose of medication? 0 Is a pillbox or other method used to improve adherence? No  Factors that may affect medication adherence? no barriers identified Are meds synced by current pharmacy? No  Are meds delivered by current pharmacy? No  Does patient experience delays in picking up medications due to  transportation concerns? No   [facility] Services Reviewed: Is patient disadvantaged to use [facility]?: No  Current Rx insurance plan: HTA Name and location of Current pharmacy[facility]  [id number] [place] [id number]: [phone]: [phone] [facility] services reviewed with patient today?: No  Patient requests to transfer care to [facility]?: No  Reason patient declined to change pharmacies: Not mentioned at this visit   Care Plan and Follow Up Patient Decision:  Patient agrees to Care Plan and Follow-up.  Plan: Telephone follow up appointment with care management team member scheduled for:  6 months  LCharlene Brooke PharmD, BCACP Clinical Pharmacist [facility] at [phone]

## 2022-05-18 NOTE — Patient Instructions (Signed)
Visit Information  Phone number for Pharmacist: [phone]   Goals Addressed   None     Care Plan : [place]  Updates made by Charlton Haws, RPH since 05/18/2022 12:00 AM     Problem: Hypertension, Hyperlipidemia, Diabetes, and Chronic Kidney Disease   Priority: High     Long-Range Goal: Disease mgmt   Start Date: 01/16/2022  Expected End Date: 01/17/2023  This Visit's Progress: On track  Recent Progress: On track  Priority: High  Note:   Current Barriers:  None identified  Pharmacist Clinical Goal(s):  Patient will contact provider office for questions/concerns as evidenced notation of same in electronic health record through collaboration with PharmD and provider.  Interventions: 1:1 collaboration with Jinny Sanders, MD regarding development and update of comprehensive plan of care as evidenced by provider attestation and co-signature Inter-disciplinary care team collaboration (see longitudinal plan of care) Comprehensive medication review performed; medication list updated in electronic medical record  Hypertension (BP goal <140/90) -Controlled - BP at goal in clinic -Current treatment: Amlodipine 5 mg daily - Appropriate, Effective, Safe, Accessible Losartan 100 mg daily - Appropriate, Effective, Safe, Accessible -Medications previously tried: n/a  -Educated on BP goals and benefits of medications for prevention of heart attack, stroke and kidney damage; -Counseled to monitor BP at home periodically -Recommended to continue current medication  Hyperlipidemia: (LDL goal < 100) -Controlled - LDL 85 (12/2021) at goal; pt endorses compliance with rosuvastatin, she had bought it outside of insurance once so has extra supply -Current treatment: Rosuvastatin 20 mg daily - Appropriate, Effective, Safe, Accessible -Medications previously tried: n/a  -Educated on Cholesterol goals; Benefits of statin for ASCVD risk reduction; -Recommended to continue  current medication  Diabetes (A1c goal <7%) -Improving - A1c 7.0% (03/2022); pt reports compliance with Trulicity and glipizide; she reports 1 episode of CGM sugar 55 overnight, she did not have any symptoms of hypoglycemia at the time - discussed if CGM data does not match symptoms, always check finger stick before taking action -Current home glucose readings:  -Reviewed AGP report: 05/05/22 to 05/18/22. Sensor active: 74%  Time in range (70-180): 83% (goal > 70%)  High (>180): 16%  Low (< 70): 1% (goal < 4%)  GMI: 6.8%; Average glucose: 144  -Previous AGP 02/03/22-02/16/22  TIR 70-180: 78% (target > 70%); high 181-250: 20%; very high >250: 1% average glu 146; GMI 6.8%  -Diet: pt lives with her daughter who prepares most meals and is not consistently following diabetic diet -Current medications: Glipizide 5 mg - 2 tab BID - Appropriate, Effective, Query Safe Trulicity 0.24 mg weekly - Appropriate, Effective, Safe, Accessible Freestyle Libre 2 - Appropriate, Effective, Safe, Accessible -Medications previously tried: Musician (cost), metformin (SE) -Educated on A1c and blood sugar goals -Reviewed CGM goals (TIR > 70%) -Recommend to continue current medication  Breast cancer  -Dx 05/2021, s/p lumpectomy 07/2021, radiation 09/2021-11/2021; follows with oncology (Dr Lindi Adie), planning letrozole for 7 years -pt c/o of significant fatigue since completing radiation; recently moved letrozole to bedtime per advice from oncology, she has not noticed much difference yet -Current treatment  Letrozole 2.5 mg daily HS - Appropriate, Effective, Safe, Accessible Gabapentin 100 mg TID prn (surgical pain) -Appropriate, Effective, Safe, Accessible Tizanidine 4 mg HS prn -Appropriate, Effective, Safe, Accessible -Medications previously tried: n/a  -Recommended to continue current medication  Fatigue (Goal: improve sx) -Improving - pt reports significant fatigue since completing radiation; she has had  labwork per PCP that was WNL; sleep  study was considered but pt declined; -Pt did start using Zquil ZZZ's supplement (melatonin, chamomile, lavender)  Chronic Kidney Disease Stage 3b  -All medications assessed for renal dosing and appropriateness in chronic kidney disease. -Recommended to continue current medication   Patient Goals/Self-Care Activities Patient will:  - take medications as prescribed as evidenced by patient report and record review focus on medication adherence by routine check glucose daily, document, and provide at future appointments       Patient verbalizes understanding of instructions and care plan provided today and agrees to view in [place]. Active MyChart status and patient understanding of how to access instructions and care plan via MyChart confirmed with patient.    Telephone follow up appointment with pharmacy team member scheduled for: 6 months  Charlene Brooke, PharmD, [facility] Clinical Pharmacist [facility] at [facility] [phone]

## 2022-06-02 ENCOUNTER — Telehealth: Payer: Self-pay | Admitting: Family Medicine

## 2022-06-02 DIAGNOSIS — C50211 Malignant neoplasm of upper-inner quadrant of right female breast: Secondary | ICD-10-CM

## 2022-06-02 DIAGNOSIS — E1121 Type 2 diabetes mellitus with diabetic nephropathy: Secondary | ICD-10-CM

## 2022-06-02 DIAGNOSIS — E039 Hypothyroidism, unspecified: Secondary | ICD-10-CM

## 2022-06-02 NOTE — Telephone Encounter (Signed)
-----   Message from Ellamae Sia sent at 05/29/2022  4:25 PM EDT ----- Regarding: Lab orders for Thursday, 9.7.23 Lab orders for f/u dm appt

## 2022-06-07 DIAGNOSIS — E785 Hyperlipidemia, unspecified: Secondary | ICD-10-CM | POA: Diagnosis not present

## 2022-06-07 DIAGNOSIS — F17210 Nicotine dependence, cigarettes, uncomplicated: Secondary | ICD-10-CM

## 2022-06-07 DIAGNOSIS — I129 Hypertensive chronic kidney disease with stage 1 through stage 4 chronic kidney disease, or unspecified chronic kidney disease: Secondary | ICD-10-CM | POA: Diagnosis not present

## 2022-06-07 DIAGNOSIS — E1122 Type 2 diabetes mellitus with diabetic chronic kidney disease: Secondary | ICD-10-CM

## 2022-06-07 DIAGNOSIS — Z794 Long term (current) use of insulin: Secondary | ICD-10-CM

## 2022-06-07 DIAGNOSIS — N1832 Chronic kidney disease, stage 3b: Secondary | ICD-10-CM | POA: Diagnosis not present

## 2022-06-11 LAB — HM DIABETES FOOT EXAM

## 2022-06-14 ENCOUNTER — Other Ambulatory Visit (INDEPENDENT_AMBULATORY_CARE_PROVIDER_SITE_OTHER): Payer: HMO

## 2022-06-14 DIAGNOSIS — E1121 Type 2 diabetes mellitus with diabetic nephropathy: Secondary | ICD-10-CM | POA: Diagnosis not present

## 2022-06-14 LAB — COMPREHENSIVE METABOLIC PANEL
ALT: 27 U/L (ref 0–35)
AST: 20 U/L (ref 0–37)
Albumin: 4.5 g/dL (ref 3.5–5.2)
Alkaline Phosphatase: 48 U/L (ref 39–117)
BUN: 26 mg/dL — ABNORMAL HIGH (ref 6–23)
CO2: 25 mEq/L (ref 19–32)
Calcium: 9.5 mg/dL (ref 8.4–10.5)
Chloride: 105 mEq/L (ref 96–112)
Creatinine, Ser: 1.29 mg/dL — ABNORMAL HIGH (ref 0.40–1.20)
GFR: 41.23 mL/min — ABNORMAL LOW (ref >60.00)
Glucose, Bld: 149 mg/dL — ABNORMAL HIGH (ref 70–99)
Potassium: 4.1 mEq/L (ref 3.5–5.1)
Sodium: 140 mEq/L (ref 135–145)
Total Bilirubin: 0.5 mg/dL (ref 0.2–1.2)
Total Protein: 7.2 g/dL (ref 6.0–8.3)

## 2022-06-14 LAB — LIPID PANEL
Cholesterol: 155 mg/dL (ref 0–200)
HDL: 30.1 mg/dL — ABNORMAL LOW (ref >39.00)
NonHDL: 125.29
Total CHOL/HDL Ratio: 5
Triglycerides: 335 mg/dL — ABNORMAL HIGH (ref 0.0–149.0)
VLDL: 67 mg/dL — ABNORMAL HIGH (ref 0.0–40.0)

## 2022-06-14 LAB — LDL CHOLESTEROL, DIRECT: Direct LDL: 89 mg/dL

## 2022-06-14 LAB — HEMOGLOBIN A1C: Hgb A1c MFr Bld: 6.7 % — ABNORMAL HIGH (ref 4.6–6.5)

## 2022-06-14 NOTE — Progress Notes (Signed)
No critical labs need to be addressed urgently. We will discuss labs in detail at upcoming office visit.   

## 2022-06-21 ENCOUNTER — Ambulatory Visit (INDEPENDENT_AMBULATORY_CARE_PROVIDER_SITE_OTHER): Payer: HMO | Admitting: Family Medicine

## 2022-06-21 ENCOUNTER — Encounter: Payer: Self-pay | Admitting: Family Medicine

## 2022-06-21 VITALS — BP 130/60 | HR 96 | Temp 98.5°F | Ht 62.0 in | Wt 146.1 lb

## 2022-06-21 DIAGNOSIS — E1159 Type 2 diabetes mellitus with other circulatory complications: Secondary | ICD-10-CM

## 2022-06-21 DIAGNOSIS — E785 Hyperlipidemia, unspecified: Secondary | ICD-10-CM

## 2022-06-21 DIAGNOSIS — Z720 Tobacco use: Secondary | ICD-10-CM

## 2022-06-21 DIAGNOSIS — E1121 Type 2 diabetes mellitus with diabetic nephropathy: Secondary | ICD-10-CM

## 2022-06-21 DIAGNOSIS — I152 Hypertension secondary to endocrine disorders: Secondary | ICD-10-CM

## 2022-06-21 DIAGNOSIS — N1831 Chronic kidney disease, stage 3a: Secondary | ICD-10-CM

## 2022-06-21 DIAGNOSIS — E1169 Type 2 diabetes mellitus with other specified complication: Secondary | ICD-10-CM

## 2022-06-21 NOTE — Assessment & Plan Note (Addendum)
Chronic, improving control Associated with diabetes.

## 2022-06-21 NOTE — Progress Notes (Signed)
Patient ID: April Ross, female    DOB: 1951-06-27, 71 y.o.   MRN: [id number]  This visit was conducted in person.  BP 130/60   Pulse 96   Temp 98.5 F (36.9 C) (Oral)   Ht 5' 2"  (1.575 m)   Wt 146 lb 2 oz (66.3 kg)   SpO2 99%   BMI 26.73 kg/m    CC:  Chief Complaint  Patient presents with   Diabetes    Subjective:     HPI: April Ross is a 71 y.o. female presenting on 06/21/2022 for Diabetes 52-monthfollow-up Diabetes: Improved control with lifestyle change on Trulicity and glipizide Lab Results  Component Value Date   HGBA1C 6.7 (H) 06/14/2022  Using medications without difficulties: Hypoglycemic episodes: Hyperglycemic episodes: Feet problems: none Blood Sugars averaging: using continuous glucose monitor.. still having spikes after breakfast  eye exam within last year: Stable chronic kidney disease GFR slightly improved from last check 3 months ago at 41  Body mass index is 26.73 kg/m.  Wt Readings from Last 3 Encounters:  06/21/22 146 lb 2 oz (66.3 kg)  03/29/22 148 lb (67.1 kg)  03/16/22 148 lb (67.1 kg)   Elevated Cholesterol: LDL at goal < 70 on rosuvastatin 20 mg p.o. daily Using medications without problems: Muscle aches:  Diet compliance: improving Exercise: some walking Other complaints:  Lab Results  Component Value Date   CHOL 155 06/14/2022   HDL 30.10 (L) 06/14/2022   LDLCALC 61 10/28/2017   LDLDIRECT 89.0 06/14/2022   TRIG 335.0 (H) 06/14/2022   CHOLHDL 5 06/14/2022   Hypertension:    BP Readings from Last 3 Encounters:  06/21/22 130/60  03/16/22 124/80  02/05/22 (!) 154/83  Using medication without problems or lightheadedness:  Chest pain with exertion: Edema: Short of breath: Average home BPs: Other issues:      Relevant past medical, surgical, family and social history reviewed and updated as indicated. Interim medical history since our last visit reviewed. Allergies and medications reviewed and  updated. Outpatient Medications Prior to Visit  Medication Sig Dispense Refill   Alcohol Swabs (B-D SINGLE USE SWABS REGULAR) PADS Use to check blood sugar up to 2 times a day 100 each 5   amLODipine (NORVASC) 5 MG tablet TAKE 1 TABLET (5 MG TOTAL) BY MOUTH DAILY. 90 tablet 0   Blood Glucose Monitoring Suppl (ONETOUCH VERIO REFLECT) w/Device KIT Use to check blood sugar up to 2 times a day 1 kit 0   cetirizine (ZYRTEC) 10 MG tablet Take 10 mg by mouth at bedtime as needed for allergies.     CINNAMON PO Take 1 tablet by mouth daily.     Continuous Blood Gluc Sensor (FREESTYLE LIBRE 2 SENSOR) MISC Apply sensor every 14 days to monitor sugar continously 2 each 5   Dulaglutide (TRULICITY) 06.64MQI/3.4VQSOPN Inject 0.75 mg into the skin once a week. 6 mL 3   gabapentin (NEURONTIN) 100 MG capsule Take 100 mg by mouth 3 (three) times daily as needed.     glipiZIDE (GLUCOTROL) 5 MG tablet TAKE 2 TABLETS (10 MG TOTAL) BY MOUTH 2 (TWO) TIMES DAILY BEFORE A MEAL. 360 tablet 0   glucose blood (ONETOUCH VERIO) test strip Use to check blood sugar up to 2 times a day 100 each 5   Lancets (ONETOUCH DELICA PLUS [id number] MISC Use to check blood sugar up to 2 times a day 100 each 5   letrozole (FEMARA) 2.5 MG tablet Take 1  tablet (2.5 mg total) by mouth daily. 90 tablet 3   levothyroxine (SYNTHROID) 88 MCG tablet TAKE 1 TABLET BY MOUTH EVERY DAY 90 tablet 3   losartan (COZAAR) 100 MG tablet TAKE 1 TABLET BY MOUTH EVERY DAY 90 tablet 0   Omega-3 Fatty Acids (FISH OIL OMEGA-3 PO) Take 1 capsule by mouth daily.     rosuvastatin (CRESTOR) 20 MG tablet TAKE 1 TABLET BY MOUTH EVERY DAY 90 tablet 3   tiZANidine (ZANAFLEX) 4 MG tablet TAKE 1 TABLET BY MOUTH AT BEDTIME AS NEEDED FOR MUSCLE SPASMS. 90 tablet 0   traMADol (ULTRAM) 50 MG tablet Take 50 mg by mouth daily as needed.     Turmeric (QC TUMERIC COMPLEX PO) Take 1 tablet by mouth daily.     vitamin B-12 (CYANOCOBALAMIN) 1000 MCG tablet Take 1,000 mcg by mouth  daily.     VITAMIN D PO Take 2 each by mouth daily.     No facility-administered medications prior to visit.     Per HPI unless specifically indicated in ROS section below Review of Systems  Constitutional:  Positive for fatigue. Negative for fever.        Likely SE to lotrezol  HENT:  Negative for congestion.   Eyes:  Negative for pain.  Respiratory:  Negative for cough and shortness of breath.   Cardiovascular:  Negative for chest pain, palpitations and leg swelling.  Gastrointestinal:  Negative for abdominal pain.  Genitourinary:  Negative for dysuria and vaginal bleeding.  Musculoskeletal:  Negative for back pain.  Neurological:  Negative for syncope, light-headedness and headaches.  Psychiatric/Behavioral:  Negative for dysphoric mood.    Objective:  BP 130/60   Pulse 96   Temp 98.5 F (36.9 C) (Oral)   Ht 5' 2"  (1.575 m)   Wt 146 lb 2 oz (66.3 kg)   SpO2 99%   BMI 26.73 kg/m   Wt Readings from Last 3 Encounters:  06/21/22 146 lb 2 oz (66.3 kg)  03/29/22 148 lb (67.1 kg)  03/16/22 148 lb (67.1 kg)      Physical Exam Constitutional:      General: She is not in acute distress.    Appearance: Normal appearance. She is well-developed. She is not ill-appearing or toxic-appearing.  HENT:     Head: Normocephalic.     Right Ear: Hearing, tympanic membrane, ear canal and external ear normal. Tympanic membrane is not erythematous, retracted or bulging.     Left Ear: Hearing, tympanic membrane, ear canal and external ear normal. Tympanic membrane is not erythematous, retracted or bulging.     Nose: No mucosal edema or rhinorrhea.     Right Sinus: No maxillary sinus tenderness or frontal sinus tenderness.     Left Sinus: No maxillary sinus tenderness or frontal sinus tenderness.     Mouth/Throat:     Pharynx: Uvula midline.  Eyes:     General: Lids are normal. Lids are everted, no foreign bodies appreciated.     Conjunctiva/sclera: Conjunctivae normal.     Pupils: Pupils  are equal, round, and reactive to light.  Neck:     Thyroid: No thyroid mass or thyromegaly.     Vascular: No carotid bruit.     Trachea: Trachea normal.  Cardiovascular:     Rate and Rhythm: Normal rate and regular rhythm.     Pulses: Normal pulses.     Heart sounds: Normal heart sounds, S1 normal and S2 normal. No murmur heard.    No friction rub.  No gallop.  Pulmonary:     Effort: Pulmonary effort is normal. No tachypnea or respiratory distress.     Breath sounds: Normal breath sounds. No decreased breath sounds, wheezing, rhonchi or rales.  Abdominal:     General: Bowel sounds are normal.     Palpations: Abdomen is soft.     Tenderness: There is no abdominal tenderness.  Musculoskeletal:     Cervical back: Normal range of motion and neck supple.  Skin:    General: Skin is warm and dry.     Findings: No Rody.  Neurological:     Mental Status: She is alert.  Psychiatric:        Mood and Affect: Mood is not anxious or depressed.        Speech: Speech normal.        Behavior: Behavior normal. Behavior is cooperative.        Thought Content: Thought content normal.        Judgment: Judgment normal.       Diabetic foot exam: Normal inspection No skin breakdown No calluses  Normal DP pulses Normal sensation to light touch and monofilament Nails normal  Results for orders placed or performed in visit on 06/14/22  Comprehensive metabolic panel  Result Value Ref Range   Sodium 140 135 - 145 mEq/L   Potassium 4.1 3.5 - 5.1 mEq/L   Chloride 105 96 - 112 mEq/L   CO2 25 19 - 32 mEq/L   Glucose, Bld 149 (H) 70 - 99 mg/dL   BUN 26 (H) 6 - 23 mg/dL   Creatinine, Ser 1.29 (H) 0.40 - 1.20 mg/dL   Total Bilirubin 0.5 0.2 - 1.2 mg/dL   Alkaline Phosphatase 48 39 - 117 U/L   AST 20 0 - 37 U/L   ALT 27 0 - 35 U/L   Total Protein 7.2 6.0 - 8.3 g/dL   Albumin 4.5 3.5 - 5.2 g/dL   GFR 41.23 (L) >60.00 mL/min   Calcium 9.5 8.4 - 10.5 mg/dL  Lipid panel  Result Value Ref Range    Cholesterol 155 0 - 200 mg/dL   Triglycerides 335.0 (H) 0.0 - 149.0 mg/dL   HDL 30.10 (L) >39.00 mg/dL   VLDL 67.0 (H) 0.0 - 40.0 mg/dL   Total CHOL/HDL Ratio 5    NonHDL 125.29   Hemoglobin A1c  Result Value Ref Range   Hgb A1c MFr Bld 6.7 (H) 4.6 - 6.5 %  LDL cholesterol, direct  Result Value Ref Range   Direct LDL 89.0 mg/dL     COVID 19 screen:  No recent travel or known exposure to COVID19 The patient denies respiratory symptoms of COVID 19 at this time. The importance of social distancing was discussed today.   Assessment and Plan    Problem List Items Addressed This Visit     CKD (chronic kidney disease) stage 3, GFR 30-59 ml/min (HCC)    Chronic, improving control Associated with diabetes.      Hyperlipidemia associated with type 2 diabetes mellitus (HCC)    Chronic, LDL at goal less than 100 on Crestor 20 mg p.o. daily      Hypertension associated with diabetes ([place])    Stable, chronic.  Continue current medication.  Continue amlodipine 5 mg p.o. daily and losartan 100 mg p.o. daily      Tobacco abuse    Encourage patient to quit smoking.  Discussed in detail lung cancer screening low-dose chest CT program.  I will refer her  to this program.  Of note she had an unremarkable chest CT in 2021.      Type 2 diabetes mellitus with nephropathy (HCC) - Primary    Chronic, improved control  Continue Trulicity 1.59 mg weekly and glipizide 10 mg p.o. twice daily. Encouraged exercise, weight loss, healthy eating habits.       Other Visit Diagnoses     Tobacco abuse disorder       Relevant Orders   Ambulatory Referral Lung Cancer Screening [facility]        Eliezer Lofts, MD

## 2022-06-21 NOTE — Assessment & Plan Note (Signed)
Chronic, improved control  Continue Trulicity 8.10 mg weekly and glipizide 10 mg p.o. twice daily. Encouraged exercise, weight loss, healthy eating habits.

## 2022-06-21 NOTE — Assessment & Plan Note (Signed)
Stable, chronic.  Continue current medication.  Continue amlodipine 5 mg p.o. daily and losartan 100 mg p.o. daily

## 2022-06-21 NOTE — Assessment & Plan Note (Signed)
Chronic, LDL at goal less than 100 on Crestor 20 mg p.o. daily

## 2022-06-21 NOTE — Assessment & Plan Note (Signed)
Encourage patient to quit smoking.  Discussed in detail lung cancer screening low-dose chest CT program.  I will refer her to this program.  Of note she had an unremarkable chest CT in 2021.

## 2022-06-21 NOTE — Patient Instructions (Addendum)
We will move forward with lung cancer screening program referral.

## 2022-06-27 ENCOUNTER — Other Ambulatory Visit: Payer: Self-pay | Admitting: Family Medicine

## 2022-06-27 DIAGNOSIS — E1121 Type 2 diabetes mellitus with diabetic nephropathy: Secondary | ICD-10-CM

## 2022-07-07 ENCOUNTER — Other Ambulatory Visit: Payer: Self-pay | Admitting: Family Medicine

## 2022-07-07 DIAGNOSIS — E1121 Type 2 diabetes mellitus with diabetic nephropathy: Secondary | ICD-10-CM

## 2022-07-24 ENCOUNTER — Other Ambulatory Visit: Payer: Self-pay | Admitting: Family Medicine

## 2022-08-04 NOTE — Progress Notes (Signed)
Patient Care Team: Jinny Sanders, MD as PCP - General (Family Medicine) Richard Miu, DMD as Consulting Physician (Dentistry) Nicholas Lose, MD as Consulting Physician (Hematology and Oncology) Charlton Haws, [facility] as Pharmacist (Pharmacist) Jovita Kussmaul, MD as Consulting Physician (General Surgery) Kyung Rudd, MD as Consulting Physician (Radiation Oncology)  DIAGNOSIS: No diagnosis found.  SUMMARY OF ONCOLOGIC HISTORY: Oncology History  Carcinoma of upper-inner quadrant of right breast in female, estrogen receptor positive ([place])  06/01/2021 Initial Diagnosis   Screening mammogram detected right breast cancer, biopsy invasive lobular cancer with LCIS grade 2, ER 90%, PR 10%, Ki-67 10%, HER2 negative ratio 1.15   07/24/2021 Surgery   Right lumpectomy: Grade 1 invasive lobular cancer 2.1 cm, margins negative, 2/11 lymph nodes positive, ER 90%, PR 10%, HER2 equivocal by IHC negative by FISH ratio 1.15, Ki-67 10% (Dr. Marlou Starks: Surgeon)   07/24/2021 Cancer Staging   Staging form: Breast, AJCC 8th Edition - Pathologic stage from 07/24/2021: Stage IA (pT2, pN1a, cM0, G1, ER+, PR+, HER2-) - Signed by Gardenia Phlegm, NP on 02/05/2022 Stage prefix: Initial diagnosis Histologic grading system: 3 grade system   09/20/2021 Oncotype testing   Oncotype DX recurrence score: 14: Distant recurrence at 9 years: 14%   09/28/2021 - 11/14/2021 Radiation Therapy   Site Technique Total Dose (Gy) Dose per Fx (Gy) Completed Fx Beam Energies  Breast, Right: Breast_R 3D 50.4/50.4 1.8 28/28 10XFFF  Breast, Right: Breast_R_SCLV 3D 50.4/50.4 1.8 28/28 6X, 10X  Breast, Right: Breast_R_Bst 3D 10/10 2 5/5 6X, 10X      11/06/2021 Cancer Staging   Staging form: Breast, AJCC 8th Edition - Clinical: Stage IIA (cT2, cN1, cM0, G1, ER+, PR+, HER2-) - Signed by Nicholas Lose, MD on 11/06/2021 Stage prefix: Initial diagnosis Histologic grading system: 3 grade system   11/2021 -  Anti-estrogen oral  therapy   Letrozole daily     CHIEF COMPLIANT: History of breast cancer  INTERVAL HISTORY: April Ross is a  71 y.o. female is here because of recent diagnosis of invasive lobular carcinoma of the right breast.  Currently on letrozole. She presents to the clinic for a follow-up.    ALLERGIES:  is allergic to acetaminophen, asa [aspirin], ibuprofen, and other.  MEDICATIONS:  Current Outpatient Medications  Medication Sig Dispense Refill   Alcohol Swabs (B-D SINGLE USE SWABS REGULAR) PADS Use to check blood sugar up to 2 times a day 100 each 5   amLODipine (NORVASC) 5 MG tablet TAKE 1 TABLET (5 MG TOTAL) BY MOUTH DAILY. 90 tablet 1   Blood Glucose Monitoring Suppl (ONETOUCH VERIO REFLECT) w/Device KIT Use to check blood sugar up to 2 times a day 1 kit 0   cetirizine (ZYRTEC) 10 MG tablet Take 10 mg by mouth at bedtime as needed for allergies.     CINNAMON PO Take 1 tablet by mouth daily.     Continuous Blood Gluc Sensor (FREESTYLE LIBRE 2 SENSOR) MISC APPLY SENSOR EVERY 14 DAYS TO MONITOR SUGAR CONTINOUSLY 2 each 5   Dulaglutide (TRULICITY) 6.78 LF/8.1OF SOPN Inject 0.75 mg into the skin once a week. 6 mL 3   gabapentin (NEURONTIN) 100 MG capsule Take 100 mg by mouth 3 (three) times daily as needed.     glipiZIDE (GLUCOTROL) 5 MG tablet TAKE 2 TABLETS (10 MG TOTAL) BY MOUTH 2 (TWO) TIMES DAILY BEFORE A MEAL. 360 tablet 1   glucose blood (ONETOUCH VERIO) test strip Use to check blood sugar up to 2 times a  day 100 each 5   Lancets (ONETOUCH DELICA PLUS [id number]) MISC Use to check blood sugar up to 2 times a day 100 each 5   letrozole (FEMARA) 2.5 MG tablet Take 1 tablet (2.5 mg total) by mouth daily. 90 tablet 3   levothyroxine (SYNTHROID) 88 MCG tablet TAKE 1 TABLET BY MOUTH EVERY DAY 90 tablet 3   losartan (COZAAR) 100 MG tablet TAKE 1 TABLET BY MOUTH EVERY DAY 90 tablet 1   Omega-3 Fatty Acids (FISH OIL OMEGA-3 PO) Take 1 capsule by mouth daily.     rosuvastatin (CRESTOR) 20 MG  tablet TAKE 1 TABLET BY MOUTH EVERY DAY 90 tablet 3   tiZANidine (ZANAFLEX) 4 MG tablet TAKE 1 TABLET BY MOUTH AT BEDTIME AS NEEDED FOR MUSCLE SPASMS. 90 tablet 0   traMADol (ULTRAM) 50 MG tablet Take 50 mg by mouth daily as needed.     Turmeric (QC TUMERIC COMPLEX PO) Take 1 tablet by mouth daily.     vitamin B-12 (CYANOCOBALAMIN) 1000 MCG tablet Take 1,000 mcg by mouth daily.     VITAMIN D PO Take 2 each by mouth daily.     No current facility-administered medications for this visit.    PHYSICAL EXAMINATION: ECOG PERFORMANCE STATUS: {CHL ONC ECOG PS:[phone]}  There were no vitals filed for this visit. There were no vitals filed for this visit.  BREAST:*** No palpable masses or nodules in either right or left breasts. No palpable axillary supraclavicular or infraclavicular adenopathy no breast tenderness or nipple discharge. (exam performed in the presence of a chaperone)  LABORATORY DATA:  I have reviewed the data as listed    Latest Ref Rng & Units 06/14/2022    7:29 AM 12/08/2021    7:30 AM 07/21/2021   12:23 PM  CMP  Glucose 70 - 99 mg/dL 149  160  211   BUN 6 - 23 mg/dL _0 Creatinine 0.40 - 1.20 mg/dL 1.29  1.35  1.45   Sodium 135 - 145 mEq/L 140  139  139   Potassium 3.5 - 5.1 mEq/L 4.1  4.0  4.3   Chloride 96 - 112 mEq/L 105  104  107   CO2 19 - 32 mEq/L _1 Calcium 8.4 - 10.5 mg/dL 9.5  9.8  10.0   Total Protein 6.0 - 8.3 g/dL 7.2  6.5    Total Bilirubin 0.2 - 1.2 mg/dL 0.5  0.5    Alkaline Phos 39 - 117 U/L 48  65    AST 0 - 37 U/L 20  24    ALT 0 - 35 U/L 27  25      Lab Results  Component Value Date   WBC 6.2 03/16/2022   HGB 13.5 03/16/2022   HCT 40.7 03/16/2022   MCV 85.9 03/16/2022   PLT 197.0 03/16/2022   NEUTROABS 4.1 03/16/2022    ASSESSMENT & PLAN:  No problem-specific Assessment & Plan notes found for this encounter.    No orders of the defined types were placed in this encounter.  The patient has a good understanding of  the overall plan. she agrees with it. she will call with any problems that may develop before the next visit here. Total time spent: 30 mins including face to face time and time spent for planning, charting and co-ordination of care   Suzzette Righter, [place] 08/04/22    I Gardiner Coins am scribing for Dr. Lindi Adie  ***

## 2022-08-08 ENCOUNTER — Telehealth: Payer: Self-pay

## 2022-08-08 NOTE — Chronic Care Management (AMB) (Signed)
Chronic Care Management Pharmacy Assistant   Name: April Ross  MRN: [id number] DOB: 02/26/1951  Reason for Encounter: General Adherence    Recent office visits:  06/21/22-April Bedsole,MD(PCP)- f/u DM, DM foot exam,discussed labs,(A1c 6.7)encouraged smoking cessation,referral for low dose CT program,f/u 6 months   Recent consult visits:  None since last CCM contact  Hospital visits:  None in previous 6 months  Medications: Outpatient Encounter Medications as of 08/08/2022  Medication Sig   Alcohol Swabs (B-D SINGLE USE SWABS REGULAR) PADS Use to check blood sugar up to 2 times a day   amLODipine (NORVASC) 5 MG tablet TAKE 1 TABLET (5 MG TOTAL) BY MOUTH DAILY.   Blood Glucose Monitoring Suppl (ONETOUCH VERIO REFLECT) w/Device KIT Use to check blood sugar up to 2 times a day   cetirizine (ZYRTEC) 10 MG tablet Take 10 mg by mouth at bedtime as needed for allergies.   CINNAMON PO Take 1 tablet by mouth daily.   Continuous Blood Gluc Ross (FREESTYLE LIBRE 2 Ross) MISC APPLY Ross EVERY 14 DAYS TO MONITOR SUGAR CONTINOUSLY   Dulaglutide (TRULICITY) 4.08 XK/4.8JE SOPN Inject 0.75 mg into the skin once a week.   gabapentin (NEURONTIN) 100 MG capsule Take 100 mg by mouth 3 (three) times daily as needed.   glipiZIDE (GLUCOTROL) 5 MG tablet TAKE 2 TABLETS (10 MG TOTAL) BY MOUTH 2 (TWO) TIMES DAILY BEFORE A MEAL.   glucose blood (ONETOUCH VERIO) test strip Use to check blood sugar up to 2 times a day   Lancets (ONETOUCH DELICA PLUS [id number]) MISC Use to check blood sugar up to 2 times a day   letrozole (FEMARA) 2.5 MG tablet Take 1 tablet (2.5 mg total) by mouth daily.   levothyroxine (SYNTHROID) 88 MCG tablet TAKE 1 TABLET BY MOUTH EVERY DAY   losartan (COZAAR) 100 MG tablet TAKE 1 TABLET BY MOUTH EVERY DAY   Omega-3 Fatty Acids (FISH OIL OMEGA-3 PO) Take 1 capsule by mouth daily.   rosuvastatin (CRESTOR) 20 MG tablet TAKE 1 TABLET BY MOUTH EVERY DAY   tiZANidine (ZANAFLEX) 4 MG  tablet TAKE 1 TABLET BY MOUTH AT BEDTIME AS NEEDED FOR MUSCLE SPASMS.   traMADol (ULTRAM) 50 MG tablet Take 50 mg by mouth daily as needed.   Turmeric (QC TUMERIC COMPLEX PO) Take 1 tablet by mouth daily.   vitamin B-12 (CYANOCOBALAMIN) 1000 MCG tablet Take 1,000 mcg by mouth daily.   VITAMIN D PO Take 2 each by mouth daily.   No facility-administered encounter medications on file as of 08/08/2022.    Contacted April Ross on 08/08/22 for general disease state and medication adherence call.   Patient is not more than 5 days past due for refill on the following medications per chart history:  [facility] Medications: Medication Name/mg Last Fill Days Supply Trulicity   63/78/58 84 Glipizide 15m   08/03/22 90 Losartan 1077m 08/01/22 90 Rosuvastatin 2039m9/8/23  90    What concerns do you have about your medications? No major concerns  The patient denies side effects with their medications.   How often do you forget or accidentally miss a dose? Never  Do you use a pillbox? Yes  Are you having any problems getting your medications from your pharmacy? No  Has the cost of your medications been a concern? Yes patient was able to change insurance plan to a diabetes medication plan-90ds  service with a more discounted rate as she was paying out of pocket $500-600.00.  Since last visit with CPP, the following interventions have been made. Patient reports taking glipizide twice daily   The patient has not had an ED visit since last contact.   The patient denies problems with their health.   Patient denies concerns or questions for April Ross, PharmD at this time.   Counseled patient on:  [place] job taking medications, Importance of taking medication daily without missed doses, Benefits of adherence packaging or a pillbox, and Access to CCM team for any cost, medication or pharmacy concerns.   Care Gaps: Annual wellness visit in last year? Yes Most Recent BP reading:130/60   06/21/22  If Diabetic: Most recent A1C reading:6.7 Last eye exam / retinopathy screening:UTD Last diabetic foot exam: UTD  Summary of recommendations from last CCM Pharmacy visit (Date:05/18/22) Summary: CCM F/U visit -DM: A1c 7.0% (03/2022); pt is wearing Freestyle Libre, per AGP report DM control is improving: time-in-range 83% and GMI 6.8% -Pt reports fatigue/sleep is somewhat improved with addition of Zquil ZZZs (melatonin plus herbal blend) Recommendations: -No med changes  Upcoming appointments: 08/09/22  Oncology   April Ross, CPP notified  April Ross, [place]  [phone]

## 2022-08-09 ENCOUNTER — Inpatient Hospital Stay: Payer: HMO | Attending: Hematology and Oncology | Admitting: Hematology and Oncology

## 2022-08-09 ENCOUNTER — Other Ambulatory Visit: Payer: Self-pay

## 2022-08-09 VITALS — BP 150/71 | HR 69 | Temp 97.7°F | Resp 18 | Ht 62.0 in | Wt 147.6 lb

## 2022-08-09 DIAGNOSIS — Z7984 Long term (current) use of oral hypoglycemic drugs: Secondary | ICD-10-CM | POA: Diagnosis not present

## 2022-08-09 DIAGNOSIS — M25531 Pain in right wrist: Secondary | ICD-10-CM | POA: Diagnosis not present

## 2022-08-09 DIAGNOSIS — Z923 Personal history of irradiation: Secondary | ICD-10-CM | POA: Insufficient documentation

## 2022-08-09 DIAGNOSIS — Z7985 Long-term (current) use of injectable non-insulin antidiabetic drugs: Secondary | ICD-10-CM | POA: Insufficient documentation

## 2022-08-09 DIAGNOSIS — Z79811 Long term (current) use of aromatase inhibitors: Secondary | ICD-10-CM | POA: Insufficient documentation

## 2022-08-09 DIAGNOSIS — Z170 Estrogen receptor positive status [ER+]: Secondary | ICD-10-CM | POA: Insufficient documentation

## 2022-08-09 DIAGNOSIS — R5383 Other fatigue: Secondary | ICD-10-CM | POA: Diagnosis not present

## 2022-08-09 DIAGNOSIS — C50211 Malignant neoplasm of upper-inner quadrant of right female breast: Secondary | ICD-10-CM | POA: Diagnosis not present

## 2022-08-09 DIAGNOSIS — Z79890 Hormone replacement therapy (postmenopausal): Secondary | ICD-10-CM | POA: Insufficient documentation

## 2022-08-09 DIAGNOSIS — Z79899 Other long term (current) drug therapy: Secondary | ICD-10-CM | POA: Diagnosis not present

## 2022-08-09 NOTE — Progress Notes (Signed)
Per MD, faxed referral to [facility] [phone],  for assessment of RUE possible tendonitis. Fax confirmation received.

## 2022-08-09 NOTE — Assessment & Plan Note (Signed)
06/01/2021: Screening mammogram detected right breast cancer invasive lobular cancer with LCIS grade 2 ER/PR positive HER2 negative with a Ki-67 10% 07/24/2021: Right lumpectomy: Grade 1 invasive lobular cancer 2.1 cm, margins negative, 2/11 lymph nodes positive, ER 90%, PR 10%, HER2 equivocal by IHC negative by FISH ratio 1.15, Ki-67 10% (Dr. Marlou Starks: Surgeon) 09/08/2021-11/14/2021: Adjuvant radiation  Treatment plan: Adjuvant antiestrogen therapy with letrozole 2.5 mg daily x7 years started 11/28/2021   Letrozole toxicities:  Breast cancer surveillance: Breast exam: 08/09/2022: Benign Mammogram 05/17/2022: Benign breast density category C  Return to clinic in 1 year for follow-up

## 2022-08-21 DIAGNOSIS — M25532 Pain in left wrist: Secondary | ICD-10-CM | POA: Diagnosis not present

## 2022-08-21 DIAGNOSIS — M25531 Pain in right wrist: Secondary | ICD-10-CM | POA: Diagnosis not present

## 2022-08-21 DIAGNOSIS — M654 Radial styloid tenosynovitis [de Quervain]: Secondary | ICD-10-CM | POA: Diagnosis not present

## 2022-09-26 ENCOUNTER — Other Ambulatory Visit: Payer: Self-pay | Admitting: Family Medicine

## 2022-09-26 MED ORDER — TRAMADOL HCL 50 MG PO TABS: 50.0000 mg | ORAL_TABLET | Freq: Every day | ORAL | 0 refills | Status: DC | PRN

## 2022-09-26 NOTE — Telephone Encounter (Signed)
  Encourage patient to contact the pharmacy for refills or they can request refills through [facility]:  Please schedule appointment if longer than 1 year  NEXT APPOINTMENT DATE:  MEDICATION:traMADol (ULTRAM) 50 MG tablet   Is the patient out of medication?   PHARMACY: [id number] [place] [place] [facility] [address]   Let patient know to contact pharmacy at the end of the day to make sure medication is ready.  Please notify patient to allow 48-72 hours to process  CLINICAL FILLS OUT ALL BELOW:   LAST REFILL:  QTY:  REFILL DATE:    OTHER COMMENTS:    Okay for refill?  Please advise

## 2022-09-26 NOTE — Telephone Encounter (Signed)
Last office visit 06/21/22 for DM.  Last refilled ? Listed as Historical Medication.  Next Visit 12/20/2022 [place].

## 2022-10-04 DIAGNOSIS — J069 Acute upper respiratory infection, unspecified: Secondary | ICD-10-CM | POA: Diagnosis not present

## 2022-10-04 DIAGNOSIS — E663 Overweight: Secondary | ICD-10-CM | POA: Diagnosis not present

## 2022-10-04 DIAGNOSIS — Z6826 Body mass index (BMI) 26.0-26.9, adult: Secondary | ICD-10-CM | POA: Diagnosis not present

## 2022-10-15 ENCOUNTER — Other Ambulatory Visit: Payer: Self-pay | Admitting: Hematology and Oncology

## 2022-10-19 DIAGNOSIS — G5601 Carpal tunnel syndrome, right upper limb: Secondary | ICD-10-CM | POA: Diagnosis not present

## 2022-10-19 DIAGNOSIS — M654 Radial styloid tenosynovitis [de Quervain]: Secondary | ICD-10-CM | POA: Diagnosis not present

## 2022-10-20 ENCOUNTER — Emergency Department (HOSPITAL_COMMUNITY): Payer: PPO

## 2022-10-20 ENCOUNTER — Emergency Department (HOSPITAL_COMMUNITY)
Admission: EM | Admit: 2022-10-20 | Discharge: 2022-10-20 | Disposition: A | Discharge status: Home / Self Care | Payer: PPO | Attending: Emergency Medicine | Admitting: Emergency Medicine

## 2022-10-20 ENCOUNTER — Encounter (HOSPITAL_COMMUNITY): Payer: Self-pay

## 2022-10-20 ENCOUNTER — Other Ambulatory Visit: Payer: Self-pay

## 2022-10-20 DIAGNOSIS — R058 Other specified cough: Secondary | ICD-10-CM | POA: Insufficient documentation

## 2022-10-20 DIAGNOSIS — R739 Hyperglycemia, unspecified: Secondary | ICD-10-CM

## 2022-10-20 DIAGNOSIS — I700 Atherosclerosis of aorta: Secondary | ICD-10-CM | POA: Insufficient documentation

## 2022-10-20 DIAGNOSIS — Z1152 Encounter for screening for COVID-19: Secondary | ICD-10-CM | POA: Insufficient documentation

## 2022-10-20 DIAGNOSIS — R0789 Other chest pain: Secondary | ICD-10-CM | POA: Insufficient documentation

## 2022-10-20 DIAGNOSIS — E1165 Type 2 diabetes mellitus with hyperglycemia: Secondary | ICD-10-CM | POA: Insufficient documentation

## 2022-10-20 DIAGNOSIS — I447 Left bundle-branch block, unspecified: Secondary | ICD-10-CM | POA: Insufficient documentation

## 2022-10-20 DIAGNOSIS — R079 Chest pain, unspecified: Secondary | ICD-10-CM

## 2022-10-20 DIAGNOSIS — Z7984 Long term (current) use of oral hypoglycemic drugs: Secondary | ICD-10-CM | POA: Insufficient documentation

## 2022-10-20 DIAGNOSIS — R059 Cough, unspecified: Secondary | ICD-10-CM | POA: Insufficient documentation

## 2022-10-20 HISTORY — DX: Malignant (primary) neoplasm, unspecified: C80.1

## 2022-10-20 HISTORY — DX: Malignant neoplasm of unspecified site of unspecified female breast: C50.919

## 2022-10-20 LAB — BASIC METABOLIC PANEL
Anion gap: 10 (ref 5–15)
BUN: 32 mg/dL — ABNORMAL HIGH (ref 8–23)
CO2: 22 mmol/L (ref 22–32)
Calcium: 9.7 mg/dL (ref 8.9–10.3)
Chloride: 103 mmol/L (ref 98–111)
Creatinine, Ser: 1.3 mg/dL — ABNORMAL HIGH (ref 0.44–1.00)
GFR, Estimated: 43 mL/min — ABNORMAL LOW (ref >60)
Glucose, Bld: 426 mg/dL — ABNORMAL HIGH (ref 70–99)
Potassium: 3.8 mmol/L (ref 3.5–5.1)
Sodium: 135 mmol/L (ref 135–145)

## 2022-10-20 LAB — CBC WITH DIFFERENTIAL/PLATELET
Abs Immature Granulocytes: 0.06 10*3/uL (ref 0.00–0.07)
Basophils Absolute: 0 10*3/uL (ref 0.0–0.1)
Basophils Relative: 0 %
Eosinophils Absolute: 0 10*3/uL (ref 0.0–0.5)
Eosinophils Relative: 0 %
HCT: 40.6 % (ref 36.0–46.0)
Hemoglobin: 13.8 g/dL (ref 12.0–15.0)
Immature Granulocytes: 0 %
Lymphocytes Relative: 9 %
Lymphs Abs: 1.3 10*3/uL (ref 0.7–4.0)
MCH: 28.9 pg (ref 26.0–34.0)
MCHC: 34 g/dL (ref 30.0–36.0)
MCV: 84.9 fL (ref 80.0–100.0)
Monocytes Absolute: 1.6 10*3/uL — ABNORMAL HIGH (ref 0.1–1.0)
Monocytes Relative: 11 %
Neutro Abs: 11.9 10*3/uL — ABNORMAL HIGH (ref 1.7–7.7)
Neutrophils Relative %: 80 %
Platelets: 224 10*3/uL (ref 150–400)
RBC: 4.78 MIL/uL (ref 3.87–5.11)
RDW: 13 % (ref 11.5–15.5)
WBC: 15 10*3/uL — ABNORMAL HIGH (ref 4.0–10.5)
nRBC: 0 % (ref 0.0–0.2)

## 2022-10-20 LAB — URINALYSIS, ROUTINE W REFLEX MICROSCOPIC
Bacteria, UA: NONE SEEN
Bilirubin Urine: NEGATIVE
Glucose, UA: >=500 mg/dL — AB
Hgb urine dipstick: NEGATIVE
Ketones, ur: NEGATIVE mg/dL
Leukocytes,Ua: NEGATIVE
Nitrite: NEGATIVE
Protein, ur: 100 mg/dL — AB
Specific Gravity, Urine: 1.018 (ref 1.005–1.030)
pH: 6 (ref 5.0–8.0)

## 2022-10-20 LAB — TROPONIN I (HIGH SENSITIVITY)
Troponin I (High Sensitivity): 30 ng/L — ABNORMAL HIGH (ref <18)
Troponin I (High Sensitivity): 32 ng/L — ABNORMAL HIGH (ref <18)

## 2022-10-20 LAB — RESP PANEL BY RT-PCR (RSV, FLU A&B, COVID)  RVPGX2
Influenza A by PCR: NEGATIVE
Influenza B by PCR: NEGATIVE
Resp Syncytial Virus by PCR: NEGATIVE
SARS Coronavirus 2 by RT PCR: NEGATIVE

## 2022-10-20 LAB — CBG MONITORING, ED
Glucose-Capillary: 411 mg/dL — ABNORMAL HIGH (ref 70–99)
Glucose-Capillary: 306 mg/dL — ABNORMAL HIGH (ref 70–99)
Glucose-Capillary: 299 mg/dL — ABNORMAL HIGH (ref 70–99)

## 2022-10-20 MED ORDER — INSULIN ASPART 100 UNIT/ML IJ SOLN
6.0000 [IU] | Freq: Once | INTRAMUSCULAR | Status: AC
  Administered 2022-10-20: 6 [IU] via SUBCUTANEOUS
  Filled 2022-10-20: qty 1

## 2022-10-20 MED ORDER — LIDOCAINE VISCOUS HCL 2 % MT SOLN
15.0000 mL | Freq: Once | OROMUCOSAL | Status: AC
  Administered 2022-10-20: 15 mL via ORAL
  Filled 2022-10-20: qty 15

## 2022-10-20 MED ORDER — SODIUM CHLORIDE 0.9 % IV BOLUS
1000.0000 mL | Freq: Once | INTRAVENOUS | Status: AC
  Administered 2022-10-20: 1000 mL via INTRAVENOUS

## 2022-10-20 MED ORDER — ALUM & MAG HYDROXIDE-SIMETH 200-200-20 MG/5ML PO SUSP
30.0000 mL | Freq: Once | ORAL | Status: AC
  Administered 2022-10-20: 30 mL via ORAL
  Filled 2022-10-20: qty 30

## 2022-10-20 NOTE — ED Provider Notes (Signed)
[facility] EMERGENCY DEPARTMENT Provider Note   CSN: [id number] Arrival date & time: 10/20/22  3546     History  Chief Complaint  Patient presents with   Hyperglycemia    April Ross is a 72 y.o. female.  She has a history of diabetes and is usually well-controlled with blood sugars between 1 40-1 80.  She is complaining of elevated blood sugars since 9 PM last night of greater than 400.  She thinks it is related to eating 2 handfuls of chocolate covered peanuts.  She has had some reflux symptoms with burning in her chest and she took some baking soda.  She also had a cough and cold symptoms from Christmas with some lingering cough that continues.  No fevers or chills vomiting diarrhea or urinary symptoms.  She tried drinking water for her blood sugar without improvement.  Complaining of excessive thirst  The history is provided by the patient.  Hyperglycemia Blood sugar level PTA:  400 Severity:  Moderate Onset quality:  Sudden Duration:  1 day Timing:  Constant Progression:  Unchanged Chronicity:  New Diabetes status:  Controlled with oral medications Context: recent change in diet   Relieved by:  Nothing Ineffective treatments: fluids. Associated symptoms: chest pain, dehydration and increased thirst   Associated symptoms: no abdominal pain, no altered mental status, no dysuria, no fever, no nausea, no shortness of breath and no vomiting        Home Medications Prior to Admission medications   Medication Sig Start Date End Date Taking? Authorizing Provider  Alcohol Swabs (B-D SINGLE USE SWABS REGULAR) PADS Use to check blood sugar up to 2 times a day 01/10/22   Bedsole, Amy E, MD  amLODipine (NORVASC) 5 MG tablet TAKE 1 TABLET (5 MG TOTAL) BY MOUTH DAILY. 07/24/22   Bedsole, Amy E, MD  Blood Glucose Monitoring Suppl (ONETOUCH VERIO REFLECT) w/Device KIT Use to check blood sugar up to 2 times a day 01/10/22   Diona Browner, Amy E, MD  cetirizine (ZYRTEC) 10 MG tablet Take 10 mg  by mouth at bedtime as needed for allergies.    [provider]  CINNAMON PO Take 1 tablet by mouth daily.    [provider]  Continuous Blood Gluc Sensor (FREESTYLE LIBRE 2 SENSOR) MISC APPLY SENSOR EVERY 14 DAYS TO MONITOR SUGAR CONTINOUSLY 06/27/22   Bedsole, Amy E, MD  Dulaglutide (TRULICITY) 5.68 LE/7.5TZ SOPN Inject 0.75 mg into the skin once a week. 01/16/22   Bedsole, Amy E, MD  gabapentin (NEURONTIN) 100 MG capsule Take 100 mg by mouth 3 (three) times daily as needed. 08/08/21   [provider]  glipiZIDE (GLUCOTROL) 5 MG tablet TAKE 2 TABLETS (10 MG TOTAL) BY MOUTH 2 (TWO) TIMES DAILY BEFORE A MEAL. 07/09/22   Bedsole, Amy E, MD  glucose blood (ONETOUCH VERIO) test strip Use to check blood sugar up to 2 times a day 01/10/22   Jinny Sanders, MD  Lancets [facility] DELICA PLUS [id number]) MISC Use to check blood sugar up to 2 times a day 01/10/22   Diona Browner, Amy E, MD  letrozole (FEMARA) 2.5 MG tablet TAKE 1 TABLET BY MOUTH EVERY DAY 10/15/22   Nicholas Lose, MD  levothyroxine (SYNTHROID) 88 MCG tablet TAKE 1 TABLET BY MOUTH EVERY DAY 02/14/22   Bedsole, Amy E, MD  losartan (COZAAR) 100 MG tablet TAKE 1 TABLET BY MOUTH EVERY DAY 07/09/22   Bedsole, Amy E, MD  Omega-3 Fatty Acids (FISH OIL OMEGA-3 PO) Take 1  capsule by mouth daily.    [provider]  rosuvastatin (CRESTOR) 20 MG tablet TAKE 1 TABLET BY MOUTH EVERY DAY 03/19/22   Bedsole, Amy E, MD  tiZANidine (ZANAFLEX) 4 MG tablet TAKE 1 TABLET BY MOUTH AT BEDTIME AS NEEDED FOR MUSCLE SPASMS. 02/15/22   Bedsole, Amy E, MD  traMADol (ULTRAM) 50 MG tablet Take 1 tablet (50 mg total) by mouth daily as needed. 09/26/22   Bedsole, Amy E, MD  Turmeric (QC TUMERIC COMPLEX PO) Take 1 tablet by mouth daily.    [provider]  vitamin B-12 (CYANOCOBALAMIN) 1000 MCG tablet Take 1,000 mcg by mouth daily.    [provider]  VITAMIN D PO Take 2 each by mouth daily.    [provider]      Allergies     Acetaminophen, Asa [aspirin], Ibuprofen, and Other    Review of Systems   Review of Systems  Constitutional:  Negative for fever.  Respiratory:  Negative for shortness of breath.   Cardiovascular:  Positive for chest pain.  Gastrointestinal:  Negative for abdominal pain, nausea and vomiting.  Endocrine: Positive for polydipsia.  Genitourinary:  Negative for dysuria.    Physical Exam Updated Vital Signs BP (!) 166/80 (BP Location: Left Arm)   Pulse 93   Temp 98 F (36.7 C) (Oral)   Resp 18   Ht '5\' 2"'$  (1.575 m)   Wt 67 kg   SpO2 98%   BMI 27.02 kg/m  Physical Exam Vitals and nursing note reviewed.  Constitutional:      General: She is not in acute distress.    Appearance: Normal appearance. She is well-developed.  HENT:     Head: Normocephalic and atraumatic.  Eyes:     Conjunctiva/sclera: Conjunctivae normal.  Cardiovascular:     Rate and Rhythm: Normal rate and regular rhythm.     Heart sounds: No murmur heard. Pulmonary:     Effort: Pulmonary effort is normal. No respiratory distress.     Breath sounds: Normal breath sounds.  Abdominal:     Palpations: Abdomen is soft.     Tenderness: There is no abdominal tenderness.  Musculoskeletal:        General: Normal range of motion.     Cervical back: Neck supple.     Right lower leg: No edema.     Left lower leg: No edema.  Skin:    General: Skin is warm and dry.     Capillary Refill: Capillary refill takes less than 2 seconds.  Neurological:     General: No focal deficit present.     Mental Status: She is alert.     ED Results / Procedures / Treatments   Labs (all labs ordered are listed, but only abnormal results are displayed) Labs Reviewed  BASIC METABOLIC PANEL - Abnormal; Notable for the following components:      Result Value   Glucose, Bld 426 (*)    BUN 32 (*)    Creatinine, Ser 1.30 (*)    GFR, Estimated 43 (*)    All other components within normal limits  CBC WITH DIFFERENTIAL/PLATELET -  Abnormal; Notable for the following components:   WBC 15.0 (*)    Neutro Abs 11.9 (*)    Monocytes Absolute 1.6 (*)    All other components within normal limits  URINALYSIS, ROUTINE W REFLEX MICROSCOPIC - Abnormal; Notable for the following components:   Color, Urine STRAW (*)    Glucose, UA >=500 (*)  Protein, ur 100 (*)    All other components within normal limits  CBG MONITORING, ED - Abnormal; Notable for the following components:   Glucose-Capillary 411 (*)    All other components within normal limits  CBG MONITORING, ED - Abnormal; Notable for the following components:   Glucose-Capillary 306 (*)    All other components within normal limits  CBG MONITORING, ED - Abnormal; Notable for the following components:   Glucose-Capillary 299 (*)    All other components within normal limits  TROPONIN I (HIGH SENSITIVITY) - Abnormal; Notable for the following components:   Troponin I (High Sensitivity) 30 (*)    All other components within normal limits  TROPONIN I (HIGH SENSITIVITY) - Abnormal; Notable for the following components:   Troponin I (High Sensitivity) 32 (*)    All other components within normal limits  RESP PANEL BY RT-PCR (RSV, FLU A&B, COVID)  [id number]    EKG EKG Interpretation  Date/Time:  Saturday October 20 2022 07:39:01 EST Ventricular Rate:  76 PR Interval:  167 QRS Duration: 140 QT Interval:  439 QTC Calculation: 494 R Axis:   63 Text Interpretation: Sinus rhythm Left bundle branch block No significant change since prior 12/21 Confirmed by Aletta Edouard [phone]) on 10/20/2022 7:40:46 AM  Radiology DG Chest Port 1 View  Result Date: 10/20/2022 CLINICAL DATA:  Coughing and chest pain. EXAM: PORTABLE CHEST 1 VIEW COMPARISON:  CT 04/12/2020 FINDINGS: The heart size and mediastinal contours are within normal limits. There is calcific plaque in the aortic arch. Both lungs are clear. The visualized skeletal structures are unremarkable. There is new demonstration  of right axillary surgical clips. Otherwise stable chest. IMPRESSION: 1. No active disease in the chest. 2. New demonstration of right axillary surgical clips. Electronically Signed   By: Telford Nab M.D.   On: 10/20/2022 07:35    Procedures Procedures    Medications Ordered in ED Medications  sodium chloride 0.9 % bolus 1,000 mL (0 mLs Intravenous Stopped 10/20/22 0950)  insulin aspart (novoLOG) injection 6 Units (6 Units Subcutaneous Given 10/20/22 0956)  alum & mag hydroxide-simeth (MAALOX/MYLANTA) 200-200-20 MG/5ML suspension 30 mL (30 mLs Oral Given 10/20/22 0954)    And  lidocaine (XYLOCAINE) 2 % viscous mouth solution 15 mL (15 mLs Oral Given 10/20/22 0954)    ED Course/ Medical Decision Making/ A&P Clinical Course as of 10/20/22 1727  Sat Oct 20, 2022  0738 Chest x-ray interpreted by me as no acute infiltrate.  Awaiting radiology reading. [MB]    Clinical Course User Index [MB] Hayden Rasmussen, MD                             Medical Decision Making Amount and/or Complexity of Data Reviewed Labs: ordered. Radiology: ordered.  Risk OTC drugs. Prescription drug management.   This patient complains of elevated blood sugar and excessive thirst; this involves an extensive number of treatment Options and is a complaint that carries with it a high risk of complications and morbidity. The differential includes hyperglycemia, DKA, metabolic derangement, dehydration, infection  I ordered, reviewed and interpreted labs, which included CBC with elevated white count normal hemoglobin, chemistries with chronic CKD elevated glucose, urinalysis without signs of infection no ketones, gap normal, troponins mildly elevated but flat, COVID and flu negative I ordered medication IV fluids subcu insulin oral GI cocktail and reviewed PMP when indicated. I ordered imaging studies which included chest x-ray and I independently  visualized and interpreted imaging which showed no acute  findings Additional history obtained from patient's family member Previous records obtained and reviewed in epic no recent admissions Cardiac monitoring reviewed, normal sinus rhythm Social determinants considered, patient with ongoing tobacco use and poor physical activity Critical Interventions: None  After the interventions stated above, I reevaluated the patient and found patient to be hemodynamically stable in no distress Admission and further testing considered, blood sugar trending down and tolerating p.o.  No indications for admission not in DKA at this time.  Recommended diet control and continuation of current medications and close follow-up with PCP.  Return instructions discussed         Final Clinical Impression(s) / ED Diagnoses Final diagnoses:  Hyperglycemia  Nonspecific chest pain    Rx / DC Orders ED Discharge Orders     None         Hayden Rasmussen, MD 10/20/22 1729

## 2022-10-20 NOTE — ED Triage Notes (Signed)
Pt arrived via POV c/o elevated CBG at home reporting last CBG was 436 PTA. Pt endorses excessive thirst. Pts CBG 411 in Triage.

## 2022-10-20 NOTE — Discharge Instructions (Signed)
You are seen in the emergency department for elevated blood sugar.  You had blood work EKG chest x-ray urinalysis.  Your blood sugar improved with some fluids and insulin.  Please continue your regular medications and stay well-hydrated.  Follow-up with your primary care doctor.  Return to the emergency department if any worsening or concerning symptoms.

## 2022-10-21 DIAGNOSIS — G5601 Carpal tunnel syndrome, right upper limb: Secondary | ICD-10-CM | POA: Diagnosis not present

## 2022-10-30 ENCOUNTER — Encounter: Payer: Self-pay | Admitting: Family Medicine

## 2022-10-30 DIAGNOSIS — G5601 Carpal tunnel syndrome, right upper limb: Secondary | ICD-10-CM | POA: Diagnosis not present

## 2022-10-30 DIAGNOSIS — M654 Radial styloid tenosynovitis [de Quervain]: Secondary | ICD-10-CM | POA: Diagnosis not present

## 2022-10-30 DIAGNOSIS — C50411 Malignant neoplasm of upper-outer quadrant of right female breast: Secondary | ICD-10-CM | POA: Diagnosis not present

## 2022-10-30 DIAGNOSIS — Z170 Estrogen receptor positive status [ER+]: Secondary | ICD-10-CM | POA: Diagnosis not present

## 2022-11-05 ENCOUNTER — Telehealth: Payer: Self-pay

## 2022-11-07 ENCOUNTER — Other Ambulatory Visit: Payer: Self-pay

## 2022-11-07 ENCOUNTER — Encounter (HOSPITAL_BASED_OUTPATIENT_CLINIC_OR_DEPARTMENT_OTHER): Payer: Self-pay | Admitting: Orthopedic Surgery

## 2022-11-07 NOTE — Progress Notes (Signed)
   11/07/22 1415  PAT Phone Screen  Is the patient taking a GLP-1 receptor agonist? Yes  Has the patient been informed on holding medication? Yes  Do You Have Diabetes? Yes  Do You Have Hypertension? Yes  Have You Ever Been to the ER for Asthma? No  Have You Taken Oral Steroids in the Past 3 Months? No  Do you Take Phenteramine or any Other Diet Drugs? No  Recent  Lab Work, EKG, CXR? (S)  Yes (pt went to the ED 10/20/22 with CGB of 426. She was not in DKA and was DC home after treatment in the ED. Pt checking her CBG 2-3x a day & aware to call if they go back up and that a CBG that high DOS would cause cancelation. Pt stated her understanding.)  Where was this test performed? (S)  10/20/22  [facility] with CBG 426,  EKG SR with LBBB- no changes from previous ekg  Do you have a history of heart problems? Yes  Have you ever had tests on your heart? Yes  What cardiac tests were performed? (S)  Echo;Chest X-ray;EKG  What date/year were cardiac tests completed? (S)  ECHO 2021 with EF 55%  Results viewable: [facility] Media Tab  Any Recent Hospitalizations? No  Height '5\' 2"'$  (1.575 m)  Weight 67.1 kg  Pat Appointment Scheduled Yes

## 2022-11-08 ENCOUNTER — Ambulatory Visit: Payer: PPO | Attending: General Surgery

## 2022-11-08 VITALS — Wt 149.5 lb

## 2022-11-08 DIAGNOSIS — Z483 Aftercare following surgery for neoplasm: Secondary | ICD-10-CM

## 2022-11-08 NOTE — Therapy (Signed)
OUTPATIENT PHYSICAL THERAPY SOZO SCREENING NOTE   Patient Name: April Ross MRN: [id number] DOB:1951-05-25, 72 y.o., female Today's Date: 11/08/2022  PCP: Jinny Sanders, MD REFERRING PROVIDER: Jovita Kussmaul, MD   PT End of Session - 11/08/22 [phone]     Visit Number 19   # unchanged due to screen only   PT Start Time 0906    PT Stop Time 0917    PT Time Calculation (min) 11 min    Activity Tolerance Patient tolerated treatment well    Behavior During Therapy [facility] for tasks assessed/performed             Past Medical History:  Diagnosis Date   Allergy    Arthritis    Breast cancer ([facility])    right side 07/2021   Cancer ([place])    CRD (chronic renal disease)    stage 3   Depression    Diabetes mellitus without complication (HCC)    GERD (gastroesophageal reflux disease)    Hyperlipidemia    Hypertension    LBBB (left bundle branch block)    a. diagnosed in 10/2017 - echo showed a preserved EF of 55-60% and NST was low-risk showing no evidence of ischemia.    Osteoporosis    polyps in colon    Thyroid disease    Urinary tract infection    Past Surgical History:  Procedure Laterality Date   ABDOMINAL HYSTERECTOMY     APPENDECTOMY     BREAST BIOPSY Right    BREAST LUMPECTOMY Right 07/2021   BREAST LUMPECTOMY WITH RADIOACTIVE SEED AND SENTINEL LYMPH NODE BIOPSY Right 07/24/2021   Procedure: RIGHT BREAST LUMPECTOMY WITH RADIOACTIVE SEED AND SENTINEL LYMPH NODE BIOPSY;  Surgeon: Jovita Kussmaul, MD;  Location: [facility];  Service: General;  Laterality: Right;   Patient Active Problem List   Diagnosis Date Noted   Other fatigue 03/16/2022   Chronic tension-type headache, not intractable 07/25/2021   Carcinoma of upper-inner quadrant of right breast in female, estrogen receptor positive ([place]) 06/15/2021   Diabetic retinopathy ([place]) 11/24/2020   Chronic bilateral thoracic back pain 11/21/2018   Upper back pain on right side 09/26/2018   Type 2 diabetes  mellitus with nephropathy ([place]) 10/27/2017   Tobacco abuse 10/27/2017   Nonspecific chest pain    Osteoporosis 05/24/2016   CKD (chronic kidney disease) stage 3, GFR 30-59 ml/min (HCC) 05/08/2016   Hyperlipidemia associated with type 2 diabetes mellitus ([place]) 01/12/2016   Hypothyroidism 01/12/2016   Hypertension associated with diabetes ([place]) 01/12/2016   Allergic rhinitis 01/12/2016   Moderate major depression, single episode ([place]) 01/12/2016   Chronic insomnia 01/12/2016   GERD (gastroesophageal reflux disease) 01/12/2016    REFERRING DIAG: right breast cancer at risk for lymphedema  THERAPY DIAG: Aftercare following surgery for neoplasm  PERTINENT HISTORY: Pt will be had a Rt lumpectomy with 2/11 lymph nodes positive on 07/24/21.Had infection and drainage just proximal to incision site during radiatio Done with radiation 2/7/20923 . No chemotherapy and now on antiestrogen.   Pt has an increase in SOZO and so is now wearing a sleeve for 4 weeks  other history includes: DM, stage 3 CKD, osteoporosis  PRECAUTIONS: right UE Lymphedema risk, None  SUBJECTIVE: Pt returns for her L-Dex screen. "I'm having carpal tunnel surgery next week."  PAIN:  Are you having pain? No, not currently  SOZO SCREENING: Patient was assessed today using the SOZO machine to determine the lymphedema index score. This was compared to her  baseline score. It was determined that she is within the recommended range when compared to her baseline and no further action is needed at this time. She will continue SOZO screenings. These are done every 3 months for 2 years post operatively followed by every 6 months for 2 years, and then annually.  Pt was very excited that her L-Dex score is finally back into a normal range after 1 year of being elevated. Encouraged her to cont wearing her daytime compression at least 4-5 hours a day and her nighttime garment at least half of the night which pt was agreeable to. Also  educated her that since she is having Rt UE carpal tunnel surgery next week she should be mindful of continuing with compression consistently for first few weeks after surgery and she verbalized understanding this as well. Pt knows she can call us to return sooner than 3 months prn.    L-DEX FLOWSHEETS - 11/08/22 0900       L-DEX LYMPHEDEMA SCREENING   Measurement Type Unilateral    L-DEX MEASUREMENT EXTREMITY Upper Extremity    POSITION  Standing    DOMINANT SIDE Right    At Risk Side Right    BASELINE SCORE (UNILATERAL) -5.9    L-DEX SCORE (UNILATERAL) -4    VALUE CHANGE (UNILAT) 1.9               Lessie, [place], PTA 11/08/2022, 9:19 AM

## 2022-11-13 ENCOUNTER — Telehealth: Payer: Self-pay

## 2022-11-13 NOTE — Progress Notes (Signed)
Care Management & Coordination Services Pharmacy Team  Reason for Encounter: Appointment Reminder  Contacted patient to confirm telephone appointment with Charlene Brooke, PharmD  11/16/22 at 9:30. Spoke with patient on 11/13/2022   Do you have any problems getting your medications? No   What is your top health concern you would like to discuss at your upcoming visit?  No concerns at this time   Have you seen any other providers since your last visit with PCP? Yes- orthopedic, general surgery    Hospital visits:  None in previous 6 months  10/20/22- [place], hyperglycemia, ordered medication IV fluids subcu insulin oral GI cocktail-blood sugar trending down and tolerating p.o. No indications for admission not in DKA at this time.   Star Rating Drugs:  Medication:  Last Fill: [employer]  14/48/18 84 Glipizide '5mg'$   08/03/22 90 Losartan '100mg'$  1025/23 90 Rosuvastatin '20mg'$  09/12/22 90   Care Gaps: Annual wellness visit in last year? Yes  If Diabetic: Last eye exam / retinopathy screening:UTD Last diabetic foot exam:UTD   Charlene Brooke, PharmD notified  April Ross, [place] Assistant [phone]

## 2022-11-14 ENCOUNTER — Ambulatory Visit (HOSPITAL_BASED_OUTPATIENT_CLINIC_OR_DEPARTMENT_OTHER): Payer: PPO | Admitting: Anesthesiology

## 2022-11-14 ENCOUNTER — Encounter (HOSPITAL_BASED_OUTPATIENT_CLINIC_OR_DEPARTMENT_OTHER): Payer: Self-pay | Admitting: Orthopedic Surgery

## 2022-11-14 ENCOUNTER — Ambulatory Visit (HOSPITAL_BASED_OUTPATIENT_CLINIC_OR_DEPARTMENT_OTHER)
Admission: RE | Admit: 2022-11-14 | Discharge: 2022-11-14 | Disposition: A | Discharge status: Home / Self Care | Payer: PPO | Attending: Orthopedic Surgery | Admitting: Orthopedic Surgery

## 2022-11-14 ENCOUNTER — Encounter (HOSPITAL_BASED_OUTPATIENT_CLINIC_OR_DEPARTMENT_OTHER)
Admission: RE | Disposition: A | Discharge status: Home / Self Care | Payer: Self-pay | Source: Home / Self Care | Attending: Orthopedic Surgery

## 2022-11-14 ENCOUNTER — Other Ambulatory Visit: Payer: Self-pay

## 2022-11-14 DIAGNOSIS — E119 Type 2 diabetes mellitus without complications: Secondary | ICD-10-CM

## 2022-11-14 DIAGNOSIS — E1121 Type 2 diabetes mellitus with diabetic nephropathy: Secondary | ICD-10-CM

## 2022-11-14 DIAGNOSIS — G5601 Carpal tunnel syndrome, right upper limb: Secondary | ICD-10-CM | POA: Insufficient documentation

## 2022-11-14 DIAGNOSIS — N183 Chronic kidney disease, stage 3 unspecified: Secondary | ICD-10-CM | POA: Diagnosis not present

## 2022-11-14 DIAGNOSIS — Z87891 Personal history of nicotine dependence: Secondary | ICD-10-CM | POA: Diagnosis not present

## 2022-11-14 DIAGNOSIS — E1122 Type 2 diabetes mellitus with diabetic chronic kidney disease: Secondary | ICD-10-CM | POA: Insufficient documentation

## 2022-11-14 DIAGNOSIS — E039 Hypothyroidism, unspecified: Secondary | ICD-10-CM | POA: Insufficient documentation

## 2022-11-14 DIAGNOSIS — Z7984 Long term (current) use of oral hypoglycemic drugs: Secondary | ICD-10-CM | POA: Diagnosis not present

## 2022-11-14 DIAGNOSIS — I10 Essential (primary) hypertension: Secondary | ICD-10-CM | POA: Diagnosis not present

## 2022-11-14 DIAGNOSIS — I129 Hypertensive chronic kidney disease with stage 1 through stage 4 chronic kidney disease, or unspecified chronic kidney disease: Secondary | ICD-10-CM | POA: Insufficient documentation

## 2022-11-14 HISTORY — PX: CARPAL TUNNEL RELEASE: SHX101

## 2022-11-14 LAB — GLUCOSE, CAPILLARY
Glucose-Capillary: 195 mg/dL — ABNORMAL HIGH (ref 70–99)
Glucose-Capillary: 183 mg/dL — ABNORMAL HIGH (ref 70–99)

## 2022-11-14 SURGERY — CARPAL TUNNEL RELEASE
Anesthesia: Monitor Anesthesia Care | Site: Wrist | Laterality: Right

## 2022-11-14 MED ORDER — SUCCINYLCHOLINE CHLORIDE 200 MG/10ML IV SOSY
PREFILLED_SYRINGE | INTRAVENOUS | Status: AC
  Filled 2022-11-14: qty 10

## 2022-11-14 MED ORDER — LIDOCAINE HCL (PF) 1 % IJ SOLN
INTRAMUSCULAR | Status: AC
  Filled 2022-11-14: qty 30

## 2022-11-14 MED ORDER — VANCOMYCIN HCL 1000 MG IV SOLR
INTRAVENOUS | Status: AC
  Filled 2022-11-14: qty 20

## 2022-11-14 MED ORDER — EPHEDRINE 5 MG/ML INJ
INTRAVENOUS | Status: AC
  Filled 2022-11-14: qty 5

## 2022-11-14 MED ORDER — CEFAZOLIN SODIUM-DEXTROSE 2-4 GM/100ML-% IV SOLN
INTRAVENOUS | Status: AC
  Filled 2022-11-14: qty 100

## 2022-11-14 MED ORDER — CEFAZOLIN SODIUM-DEXTROSE 2-4 GM/100ML-% IV SOLN
2.0000 g | INTRAVENOUS | Status: AC
  Administered 2022-11-14: 2 g via INTRAVENOUS

## 2022-11-14 MED ORDER — LIDOCAINE 2% (20 MG/ML) 5 ML SYRINGE
INTRAMUSCULAR | Status: DC | PRN
  Administered 2022-11-14: 40 mg via INTRAVENOUS

## 2022-11-14 MED ORDER — ONDANSETRON HCL 4 MG/2ML IJ SOLN
INTRAMUSCULAR | Status: DC | PRN
  Administered 2022-11-14: 4 mg via INTRAVENOUS

## 2022-11-14 MED ORDER — MIDAZOLAM HCL 5 MG/5ML IJ SOLN
INTRAMUSCULAR | Status: DC | PRN
  Administered 2022-11-14: .5 mg via INTRAVENOUS

## 2022-11-14 MED ORDER — PHENYLEPHRINE 80 MCG/ML (10ML) SYRINGE FOR IV PUSH (FOR BLOOD PRESSURE SUPPORT)
PREFILLED_SYRINGE | INTRAVENOUS | Status: AC
  Filled 2022-11-14: qty 10

## 2022-11-14 MED ORDER — FENTANYL CITRATE (PF) 100 MCG/2ML IJ SOLN: 25.0000 ug | INTRAMUSCULAR | Status: DC | PRN

## 2022-11-14 MED ORDER — FENTANYL CITRATE (PF) 100 MCG/2ML IJ SOLN
INTRAMUSCULAR | Status: DC | PRN
  Administered 2022-11-14: 25 ug via INTRAVENOUS

## 2022-11-14 MED ORDER — BUPIVACAINE HCL 0.25 % IJ SOLN
INTRAMUSCULAR | Status: DC | PRN
  Administered 2022-11-14: 10 mL

## 2022-11-14 MED ORDER — FENTANYL CITRATE (PF) 100 MCG/2ML IJ SOLN
INTRAMUSCULAR | Status: AC
  Filled 2022-11-14: qty 2

## 2022-11-14 MED ORDER — PROPOFOL 500 MG/50ML IV EMUL
INTRAVENOUS | Status: DC | PRN
  Administered 2022-11-14: 100 ug/kg/min via INTRAVENOUS

## 2022-11-14 MED ORDER — OXYCODONE HCL 5 MG PO TABS: 5.0000 mg | ORAL_TABLET | Freq: Once | ORAL | Status: DC | PRN

## 2022-11-14 MED ORDER — ATROPINE SULFATE 0.4 MG/ML IV SOLN
INTRAVENOUS | Status: AC
  Filled 2022-11-14: qty 1

## 2022-11-14 MED ORDER — ONDANSETRON HCL 4 MG/2ML IJ SOLN
INTRAMUSCULAR | Status: AC
  Filled 2022-11-14: qty 2

## 2022-11-14 MED ORDER — BUPIVACAINE HCL (PF) 0.25 % IJ SOLN
INTRAMUSCULAR | Status: AC
  Filled 2022-11-14: qty 60

## 2022-11-14 MED ORDER — LACTATED RINGERS IV SOLN: INTRAVENOUS | Status: DC

## 2022-11-14 MED ORDER — LIDOCAINE 2% (20 MG/ML) 5 ML SYRINGE
INTRAMUSCULAR | Status: AC
  Filled 2022-11-14: qty 5

## 2022-11-14 MED ORDER — PROPOFOL 10 MG/ML IV BOLUS
INTRAVENOUS | Status: DC | PRN
  Administered 2022-11-14: 20 mg via INTRAVENOUS

## 2022-11-14 MED ORDER — MIDAZOLAM HCL 2 MG/2ML IJ SOLN
INTRAMUSCULAR | Status: AC
  Filled 2022-11-14: qty 2

## 2022-11-14 MED ORDER — OXYCODONE HCL 5 MG/5ML PO SOLN: 5.0000 mg | Freq: Once | ORAL | Status: DC | PRN

## 2022-11-14 SURGICAL SUPPLY — 38 items
APL PRP STRL LF DISP 70% ISPRP (MISCELLANEOUS) ×1
BLADE SURG 15 STRL LF DISP TIS (BLADE) ×1 IMPLANT
BLADE SURG 15 STRL SS (BLADE) ×1
BNDG CMPR 9X4 STRL LF SNTH (GAUZE/BANDAGES/DRESSINGS) ×1
BNDG ELASTIC 3X5.8 VLCR STR LF (GAUZE/BANDAGES/DRESSINGS) ×1 IMPLANT
BNDG ESMARK 4X9 LF (GAUZE/BANDAGES/DRESSINGS) ×1 IMPLANT
BNDG GAUZE DERMACEA FLUFF 4 (GAUZE/BANDAGES/DRESSINGS) ×1 IMPLANT
BNDG GZE DERMACEA 4 6PLY (GAUZE/BANDAGES/DRESSINGS) ×1
BNDG PLASTER X FAST 3X3 WHT LF (CAST SUPPLIES) IMPLANT
BNDG PLSTR 9X3 FST ST WHT (CAST SUPPLIES)
CHLORAPREP W/TINT 26 (MISCELLANEOUS) ×1 IMPLANT
CORD BIPOLAR FORCEPS 12FT (ELECTRODE) ×1 IMPLANT
COVER BACK TABLE 60X90IN (DRAPES) ×1 IMPLANT
COVER MAYO STAND STRL (DRAPES) ×1 IMPLANT
CUFF TOURN SGL QUICK 18X4 (TOURNIQUET CUFF) IMPLANT
CUFF TOURN SGL QUICK 24 (TOURNIQUET CUFF)
CUFF TRNQT CYL 24X4X16.5-23 (TOURNIQUET CUFF) IMPLANT
DRAPE EXTREMITY T 121X128X90 (DISPOSABLE) ×1 IMPLANT
DRAPE SURG 17X23 STRL (DRAPES) ×1 IMPLANT
GAUZE XEROFORM 1X8 LF (GAUZE/BANDAGES/DRESSINGS) ×1 IMPLANT
GLOVE BIO SURGEON STRL SZ7 (GLOVE) ×1 IMPLANT
GLOVE BIOGEL PI IND STRL 7.0 (GLOVE) ×1 IMPLANT
GOWN STRL REUS W/ TWL LRG LVL3 (GOWN DISPOSABLE) ×2 IMPLANT
GOWN STRL REUS W/TWL LRG LVL3 (GOWN DISPOSABLE) ×2
NDL HYPO 25X1 1.5 SAFETY (NEEDLE) IMPLANT
NEEDLE HYPO 25X1 1.5 SAFETY (NEEDLE) IMPLANT
NS IRRIG 1000ML POUR BTL (IV SOLUTION) ×1 IMPLANT
PACK BASIN DAY SURGERY FS (CUSTOM PROCEDURE TRAY) ×1 IMPLANT
PAD CAST 3X4 CTTN HI CHSV (CAST SUPPLIES) ×1 IMPLANT
PADDING CAST COTTON 3X4 STRL (CAST SUPPLIES) ×1
SLEEVE SCD COMPRESS KNEE MED (STOCKING) IMPLANT
SUT ETHILON 4 0 PS 2 18 (SUTURE) ×1 IMPLANT
SUT MNCRL AB 3-0 PS2 18 (SUTURE) IMPLANT
SUT VICRYL 4-0 PS2 18IN ABS (SUTURE) IMPLANT
SYR BULB EAR ULCER 3OZ GRN STR (SYRINGE) ×1 IMPLANT
SYR CONTROL 10ML LL (SYRINGE) ×1 IMPLANT
TOWEL GREEN STERILE FF (TOWEL DISPOSABLE) ×2 IMPLANT
UNDERPAD 30X36 HEAVY ABSORB (UNDERPADS AND DIAPERS) ×1 IMPLANT

## 2022-11-14 NOTE — Interval H&P Note (Signed)
History and Physical Interval Note:  11/14/2022 8:28 AM  April Ross  has presented today for surgery, with the diagnosis of Right carpal tunnel syndrome.  The various methods of treatment have been discussed with the patient and family. After consideration of risks, benefits and other options for treatment, the patient has consented to  Procedure(s) with comments: [place] (Right) - or MAC with local as a surgical intervention.  The patient's history has been reviewed, patient examined, no change in status, stable for surgery.  I have reviewed the patient's chart and labs.  Questions were answered to the patient's satisfaction.     Kaena Santori Yahayra Geis

## 2022-11-14 NOTE — H&P (Signed)
HAND SURGERY   HPI: Patient is a 72 y.o. female who presents with right carpal tunnel syndrome that was confirmed on electrodiagnostic studies and has failed conservative management.  She presents today for right carpal tunnel release.  Patient denies any changes to their medical history or new systemic symptoms today.    Past Medical History:  Diagnosis Date   Allergy    Arthritis    Breast cancer ([place])    right side 07/2021   Cancer ([place])    CRD (chronic renal disease)    stage 3   Depression    Diabetes mellitus without complication (HCC)    GERD (gastroesophageal reflux disease)    Hyperlipidemia    Hypertension    LBBB (left bundle branch block)    a. diagnosed in 10/2017 - echo showed a preserved EF of 55-60% and NST was low-risk showing no evidence of ischemia.    Osteoporosis    polyps in colon    Thyroid disease    Urinary tract infection    Past Surgical History:  Procedure Laterality Date   ABDOMINAL HYSTERECTOMY     APPENDECTOMY     BREAST BIOPSY Right    BREAST LUMPECTOMY Right 07/2021   BREAST LUMPECTOMY WITH RADIOACTIVE SEED AND SENTINEL LYMPH NODE BIOPSY Right 07/24/2021   Procedure: RIGHT BREAST LUMPECTOMY WITH RADIOACTIVE SEED AND SENTINEL LYMPH NODE BIOPSY;  Surgeon: Jovita Kussmaul, MD;  Location: [facility];  Service: General;  Laterality: Right;   Social History   Socioeconomic History   Marital status: Widowed    Spouse name: Not on file   Number of children: Not on file   Years of education: Not on file   Highest education level: Not on file  Occupational History   Not on file  Tobacco Use   Smoking status: Former    Packs/day: 1.00    Years: 15.00    Total pack years: 15.00    Types: Cigarettes   Smokeless tobacco: Never  Vaping Use   Vaping Use: Never used  Substance and Sexual Activity   Alcohol use: Yes    Alcohol/week: 0.0 standard drinks of alcohol    Comment: occ glass of wine or mix drink   Drug use: No    Sexual activity: Not Currently  Other Topics Concern   Not on file  Social History Narrative   Moved from [place] 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    Social Determinants of Health   Financial Resource Strain: Low Risk  (01/16/2022)   Overall Financial Resource Strain (CARDIA)    Difficulty of Paying Living Expenses: Not very hard  Food Insecurity: No Food Insecurity (01/16/2022)   Hunger Vital Sign    Worried About Running Out of Food in the Last Year: Never true    Ran Out of Food in the Last Year: Never true  Transportation Needs: No Transportation Needs (08/18/2021)   PRAPARE - Hydrologist (Medical): No    Lack of Transportation (Non-Medical): No  Physical Activity: Inactive (08/28/2019)   Exercise Vital Sign    Days of Exercise per Week: 0 days    Minutes of Exercise per Session: 0 min  Stress: No Stress Concern Present (08/28/2019)   [place]    Feeling of Stress : Not at all  Social Connections: Not on file   Family History  Problem Relation Age of Onset  Alzheimer's disease Mother    Hyperlipidemia Mother    Hypertension Mother    Asthma Maternal Grandmother    Diabetes Maternal Grandfather    Alcohol abuse Maternal Grandfather    Hyperlipidemia Maternal Grandfather    Stroke Maternal Grandfather    Hypertension Maternal Grandfather    Diabetes Paternal Grandmother    Alcohol abuse Maternal Uncle    Cholecystitis Maternal Uncle    Arthritis Maternal Uncle    - negative except otherwise stated in the family history section Allergies  Allergen Reactions   Acetaminophen     Raisanen    Asa [Aspirin]     Buckholtz groin and axilla area   Ibuprofen    Other [place]    Mycolog-II & HRT Hormone patch   Prior to Admission medications   Medication Sig Start Date End Date Taking? Authorizing Provider  amLODipine (NORVASC) 5  MG tablet TAKE 1 TABLET (5 MG TOTAL) BY MOUTH DAILY. 07/24/22  Yes Bedsole, Amy E, MD  cetirizine (ZYRTEC) 10 MG tablet Take 10 mg by mouth at bedtime as needed for allergies.   Yes [provider]  CINNAMON PO Take 1 tablet by mouth daily.   Yes [provider]  Dulaglutide (TRULICITY) 8.31 DV/7.6HY SOPN Inject 0.75 mg into the skin once a week. 01/16/22  Yes Bedsole, Amy E, MD  gabapentin (NEURONTIN) 100 MG capsule Take 100 mg by mouth 3 (three) times daily as needed. 08/08/21  Yes [provider]  glipiZIDE (GLUCOTROL) 5 MG tablet TAKE 2 TABLETS (10 MG TOTAL) BY MOUTH 2 (TWO) TIMES DAILY BEFORE A MEAL. 07/09/22  Yes Bedsole, Amy E, MD  letrozole (FEMARA) 2.5 MG tablet TAKE 1 TABLET BY MOUTH EVERY DAY 10/15/22  Yes Nicholas Lose, MD  levothyroxine (SYNTHROID) 88 MCG tablet TAKE 1 TABLET BY MOUTH EVERY DAY 02/14/22  Yes Bedsole, Amy E, MD  losartan (COZAAR) 100 MG tablet TAKE 1 TABLET BY MOUTH EVERY DAY 07/09/22  Yes Bedsole, Amy E, MD  Omega-3 Fatty Acids (FISH OIL OMEGA-3 PO) Take 1 capsule by mouth daily.   Yes [provider]  rosuvastatin (CRESTOR) 20 MG tablet TAKE 1 TABLET BY MOUTH EVERY DAY 03/19/22  Yes Bedsole, Amy E, MD  tiZANidine (ZANAFLEX) 4 MG tablet TAKE 1 TABLET BY MOUTH AT BEDTIME AS NEEDED FOR MUSCLE SPASMS. 02/15/22  Yes Bedsole, Amy E, MD  traMADol (ULTRAM) 50 MG tablet Take 1 tablet (50 mg total) by mouth daily as needed. 09/26/22  Yes Bedsole, Amy E, MD  Turmeric (QC TUMERIC COMPLEX PO) Take 1 tablet by mouth daily.   Yes [provider]  vitamin B-12 (CYANOCOBALAMIN) 1000 MCG tablet Take 1,000 mcg by mouth daily.   Yes [provider]  VITAMIN D PO Take 2 each by mouth daily.   Yes [provider]  Alcohol Swabs (B-D SINGLE USE SWABS REGULAR) PADS Use to check blood sugar up to 2 times a day 01/10/22   Diona Browner, Amy E, MD  Blood Glucose Monitoring Suppl (ONETOUCH VERIO REFLECT) w/Device KIT Use to check blood sugar up to 2  times a day 01/10/22   Bedsole, Amy E, MD  Continuous Blood Gluc Sensor (FREESTYLE LIBRE 2 SENSOR) MISC APPLY SENSOR EVERY 14 DAYS TO MONITOR SUGAR CONTINOUSLY 06/27/22   Bedsole, Amy E, MD  glucose blood (ONETOUCH VERIO) test strip Use to check blood sugar up to 2 times a day 01/10/22   Jinny Sanders, MD  Lancets (ONETOUCH DELICA PLUS [id number]) MISC Use to check blood  sugar up to 2 times a day 01/10/22   Eliezer Lofts E, MD   No results found. - Positive ROS: All other systems have been reviewed and were otherwise negative with the exception of those mentioned in the HPI and as above.  Physical Exam: General: No acute distress, resting comfortably Cardiovascular: BUE warm and well perfused, normal rate Respiratory: Normal WOB on RA Skin: Warm and dry Neurologic: Sensation intact distally Psychiatric: Patient is at baseline mood and affect  Right Upper Extremity  Positive Tinel, Phalen, and Durkan signs 5/5 thenar motor strength without atrophy Full AROM of wrist and fingers SILT m/u/r distribution Hand warm and well perfused w/ BCR   Assessment: 72 yo F w/ R CTS that has failed conservative management.   Plan: OR today for open R CTR. We again reviewed the risks of surgery which include bleeding, infection, damage to the median nerve or it's branches, persistent symptoms, persistent swelling, pillar pain, stiffness, need for additional surgery.  Informed consent was signed.  All questions were answered.   Sherilyn Cooter, M.D. [facility] 8:26 AM

## 2022-11-14 NOTE — Transfer of Care (Signed)
Immediate Anesthesia Transfer of Care Note  Patient: April Ross  Procedure(s) Performed: CARPAL TUNNEL RELEASE (Right: Wrist)  Patient Location: PACU  Anesthesia Type:MAC  Level of Consciousness: awake  Airway & Oxygen Therapy: Patient Spontanous Breathing  Post-op Assessment: Report given to RN  Post vital signs: Reviewed and stable  Last Vitals:  Vitals Value Taken Time  BP 133/43 11/14/22 0910  Temp    Pulse 73 11/14/22 0912  Resp 14 11/14/22 0912  SpO2 96 % 11/14/22 0912  Vitals shown include unvalidated device data.  Last Pain:  Vitals:   11/14/22 0717  TempSrc: Oral  PainSc: 0-No pain      Patients Stated Pain Goal: 3 (52/08/02 2336)  Complications: No notable events documented.

## 2022-11-14 NOTE — Anesthesia Preprocedure Evaluation (Addendum)
Anesthesia Evaluation  Patient identified by MRN, date of birth, ID band Patient awake    Reviewed: Allergy & Precautions, NPO status , Patient's Chart, lab work & pertinent test results  History of Anesthesia Complications Negative for: history of anesthetic complications  Airway Mallampati: II  TM Distance: >3 FB Neck ROM: Full    Dental no notable dental hx.    Pulmonary Patient abstained from smoking., former smoker   Pulmonary exam normal        Cardiovascular hypertension, Pt. on medications Normal cardiovascular exam     Neuro/Psych  Headaches   Depression       GI/Hepatic Neg liver ROS,GERD  ,,  Endo/Other  diabetes, Type 2, Oral Hypoglycemic AgentsHypothyroidism    Renal/GU Renal InsufficiencyRenal disease  negative genitourinary   Musculoskeletal  (+) Arthritis ,    Abdominal   Peds  Hematology negative hematology ROS (+)   Anesthesia Other Findings Day of surgery medications reviewed with patient.  Reproductive/Obstetrics negative OB ROS                              Anesthesia Physical Anesthesia Plan  ASA: 2  Anesthesia Plan: MAC   Post-op Pain Management: Tylenol PO (pre-op)*   Induction:   PONV Risk Score and Plan: 2 and Treatment may vary due to age or medical condition, Propofol infusion and Ondansetron  Airway Management Planned: Natural Airway and Nasal Cannula  Additional Equipment: None  Intra-op Plan:   Post-operative Plan:   Informed Consent: I have reviewed the patients History and Physical, chart, labs and discussed the procedure including the risks, benefits and alternatives for the proposed anesthesia with the patient or authorized representative who has indicated his/her understanding and acceptance.       Plan Discussed with: CRNA  Anesthesia Plan Comments:         Anesthesia Quick Evaluation

## 2022-11-14 NOTE — Op Note (Signed)
   Date of Surgery: 11/14/2022  INDICATIONS: Patient is a 72 y.o.-year-old female with right carpal tunnel syndrome that was confirmed on electrodiagnostic studies and has failed conservative management.  Risks, benefits, and alternatives to surgery were again discussed with the patient in the preoperative area. The patient wishes to proceed with surgery.  Informed consent was signed after our discussion.   PREOPERATIVE DIAGNOSIS:  Right carpal tunnel syndrome  POSTOPERATIVE DIAGNOSIS: Same.  PROCEDURE:  Right carpal tunnel release   SURGEON: Audria Nine, M.D.  ASSIST: None  ANESTHESIA:  Local + MAC  IV FLUIDS AND URINE: See anesthesia.  ESTIMATED BLOOD LOSS: <5 mL.  IMPLANTS: * No implants in log *   DRAINS: None  COMPLICATIONS: None  DESCRIPTION OF PROCEDURE: The patient was met in the preoperative holding area where the surgical site was marked and the consent form was verified.  The patient was then taken to the operating room.  A hand table was placed adjacent to the operative side of the stretcher and locked into place.  All bony prominences were well padded.  A tourniquet was applied to the right forearm.  Monitored sedation was induced.   A formal time-out was performed to confirm that this was the correct patient, surgery, side, and site. A local block was performed using 10cc of 0.25% plain marcaine.  The operative extremity was prepped and draped in the usual and sterile fashion.    Following a second timeout, the limb was exsanguinated and the tourniquet inflated to 250 mmHg.  A longitudinal incision was made in line with the radial border of the ring finger from distal to the wrist flexion crease to the intersection of Kaplan's cardinal line.  The skin and subcutaneous tissue was sharply divided.  The longitudinally running palmar fascia was incised.  A trans-ligamentous motor branch was identified.  The thenar musculature was bluntly swept off of the transverse carpal  ligament distal to the motor branch.  The transverse carpal ligament was sharply divided with a 15 blade scalpel distal to the motor branch.  Using a tenotomy scissor, the transverse carpal ligament was divided just adjacent to the motor branch with care taken to protect the nerve branch.  Retractors were then placed in the proximal aspect of the wound to visualize the proximal portion of the transverse carpal ligament and the distal antebrachial fascia.  The remaining portion of the ligament proximal to the motor branch and the antebrachial fascia was sharply divided under direct visualization.   The median nerve was inspected.  The motor branch was coming from the ulnar aspect of the nerve.  It was in complete continuity following division of the transverse carpal ligament. The wound was then thoroughly irrigated with sterile saline.  The tourniquet was deflated.  Hemostasis was achieved with direct pressure and bipolar electrocautery.  The wound was then closed with 4-0 nylon sutures in a horizontal mattress fashion. The wound was then dressed with xeroform, folded kerlix, and an ace wrap.  The patient was then reversed from anesthesia.  All counts were correct x 2 at the end of the procedure.  The patient was taken to the recovery unit in stable condition.     POSTOPERATIVE PLAN: She will be discharge to home with appropriate pain medication and discharge instructions. I will see her back in 10-14 days for her first postop visit.   Audria Nine, MD 9:11 AM

## 2022-11-14 NOTE — Discharge Instructions (Addendum)
Audria Nine, M.D. Hand Surgery  POST-OPERATIVE DISCHARGE INSTRUCTIONS   PRESCRIPTIONS: - You may have been given a prescription to be taken as directed for post-operative pain control.  You may also take over the counter ibuprofen/aleve and tylenol for pain. Take this as directed on the packaging. Do not exceed 3000 mg tylenol/acetaminophen in 24 hours.  Ibuprofen 600-800 mg (3-4) tablets by mouth every 6 hours as needed for pain.   OR  Aleve 2 tablets by mouth every 12 hours (twice daily) as needed for pain.   AND/OR  Tylenol 1000 mg (2 tablets) every 8 hours as needed for pain.  - Please use your pain medication carefully, as refills are limited and you may not be provided with one.  As stated above, please use over the counter pain medicine - it will also be helpful with decreasing your swelling.    ANESTHESIA: -After your surgery, post-surgical discomfort or pain is likely. This discomfort can last several days to a few weeks. At certain times of the day your discomfort may be more intense.   Did you receive a nerve block?   - A nerve block can provide pain relief for one hour to two days after your surgery. As long as the nerve block is working, you will experience little or no sensation in the area the surgeon operated on.  - As the nerve block wears off, you will begin to experience pain or discomfort. It is very important that you begin taking your prescribed pain medication before the nerve block fully wears off. Treating your pain at the first sign of the block wearing off will ensure your pain is better controlled and more tolerable when full-sensation returns. Do not wait until the pain is intolerable, as the medicine will be less effective. It is better to treat pain in advance than to try and catch up.   General Anesthesia:  If you did not receive a nerve block during your surgery, you will need to start taking your pain medication shortly after your surgery and  should continue to do so as prescribed by your surgeon.     ICE AND ELEVATION: - You may use ice for the first 48-72 hours, but it is not critical.   - Motion of your fingers is very important to decrease the swelling.  - Elevation, as much as possible for the next 48 hours, is critical for decreasing swelling as well as for pain relief. Elevation means when you are seated or lying down, you hand should be at or above your heart. When walking, the hand needs to be at or above the level of your elbow.  - If the bandage gets too tight, it may need to be loosened. Please contact our office and we will instruct you in how to do this.    SURGICAL BANDAGES:  - Keep your dressing and/or splint clean and dry at all times.  You can remove your dressing 7 days from now and change with a dry dressing or Band-Aids as needed thereafter. - You may place a plastic bag over your bandage to shower, but be careful, do not get your bandages wet.  - After the bandages have been removed, it is OK to get the stitches wet in a shower or with hand washing. Do Not soak or submerge the wound yet. Please do not use lotions or creams on the stitches.      HAND THERAPY:  - You may not need any. If you  do, we will begin this at your follow up visit in the clinic.    ACTIVITY AND WORK: - You are encouraged to move any fingers which are not in the bandage.  - Light use of the fingers is allowed to assist the other hand with daily hygiene and eating, but strong gripping or lifting is often uncomfortable and should be avoided.  - You might miss a variable period of time from work and hopefully this issue has been discussed prior to surgery. You may not do any heavy work with your affected hand for about 2 weeks.    [facility] [address] Floor, [address], [place] [address] [phone]    Post Anesthesia Home Care Instructions  Activity: Get plenty of rest for the remainder of the day. A  responsible individual must stay with you for 24 hours following the procedure.  For the next 24 hours, DO NOT: -Drive a car -Paediatric nurse -Drink alcoholic beverages -Take any medication unless instructed by your physician -Make any legal decisions or sign important papers.  Meals: Start with liquid foods such as gelatin or soup. Progress to regular foods as tolerated. Avoid greasy, spicy, heavy foods. If nausea and/or vomiting occur, drink only clear liquids until the nausea and/or vomiting subsides. Call your physician if vomiting continues.  Special Instructions/Symptoms: Your throat may feel dry or sore from the anesthesia or the breathing tube placed in your throat during surgery. If this causes discomfort, gargle with warm salt water. The discomfort should disappear within 24 hours.

## 2022-11-14 NOTE — Anesthesia Postprocedure Evaluation (Signed)
Anesthesia Post Note  Patient: April Ross  Procedure(s) Performed: CARPAL TUNNEL RELEASE (Right: Wrist)     Patient location during evaluation: PACU Anesthesia Type: MAC Level of consciousness: awake and alert Pain management: pain level controlled Vital Signs Assessment: post-procedure vital signs reviewed and stable Respiratory status: spontaneous breathing, nonlabored ventilation and respiratory function stable Cardiovascular status: blood pressure returned to baseline Postop Assessment: no apparent nausea or vomiting Anesthetic complications: no   No notable events documented.  Last Vitals:  Vitals:   11/14/22 0910 11/14/22 0915  BP: (!) 133/43 108/61  Pulse: 68 61  Resp: 12 13  Temp: (!) 36.4 C   SpO2: 96% 97%    Last Pain:  Vitals:   11/14/22 0915  TempSrc:   PainSc: [place]

## 2022-11-15 ENCOUNTER — Encounter (HOSPITAL_BASED_OUTPATIENT_CLINIC_OR_DEPARTMENT_OTHER): Payer: Self-pay | Admitting: Orthopedic Surgery

## 2022-11-16 ENCOUNTER — Telehealth: Payer: Self-pay | Admitting: Pharmacist

## 2022-11-16 ENCOUNTER — Ambulatory Visit: Payer: HMO | Admitting: Pharmacist

## 2022-11-16 NOTE — Patient Instructions (Signed)
Visit Information  Phone number for Pharmacist: [phone]  Thank you for meeting with me to discuss your medications! Below is a summary of what we talked about during the visit:   Recommendations/Changes made from today's visit: -Increase Trulicity to 1.5 mg weekly - consulting with PCP -Advised to monitor BP several times weekly for new few weeks; goal BP < 140/90  Follow up plan: -Health Concierge will call patient 2 weeks for BP update -Pharmacist follow up televisit scheduled for 2 months -PCP appt 12/20/22   Charlene Brooke, PharmD, BCACP Clinical Pharmacist [facility] [phone]

## 2022-11-16 NOTE — Progress Notes (Signed)
Care Management & Coordination Services Pharmacy Note  11/16/2022 Name:  April Ross MRN:  [id number] DOB:  14-Oct-1950  Summary: F/U visit -Reviewed recent ED visit (10/20/22) for hyperglycemia, BG was > 400. Pt thought this was due to 2 handfuls of chocolate peanuts. Per chart review, pt did have a steroid injection in her wrist for carpal tunnel the day before the admission. Discussed this was likely the cause of the sustained hyperglycemia, not the peanuts. Advised she will need to monitor glucose very closely following future steroid injections. -Reviewed AGP report: 11/03/22 to 11/16/22. Sensor active: 91%  Time in range (70-180): 58% (goal > 70%)  High (180-250): 37%  Very high (>250): 5%  Low (< 70): 0% (goal < 4%)  GMI: 7.5%; Average glucose: 176 -Pt is interested in weight loss and agrees to increase Trulicity -HTN: BP has been elevated in recent OV's (> 140/90), but pt was being seen for acute issues so BP may have only been temporarily elevated; she is not checking BP at home  Recommendations/Changes made from today's visit: -Increase Trulicity to 1.5 mg weekly - consulting with PCP -Advised to monitor BP several times weekly for new few weeks; goal BP < 140/90  Follow up plan: -Health Concierge will call patient 2 weeks for BP update -Pharmacist follow up televisit scheduled for 2 months -PCP appt 12/20/22    Subjective: April Ross is an 72 y.o. year old female who is a primary patient of Bedsole, Amy E, MD.  The care coordination team was consulted for assistance with disease management and care coordination needs.    Engaged with patient by telephone for follow up visit.  Recent office visits: 06/21/22 Dr Diona Browner OV: f/u - DM foot exam done. Referred for lung cancer screening. Encouraged smoking cessation.  Recent consult visits: 11/14/22 Admission - carpal tunnel release  10/19/21 Dr Tempie Donning ([facility]): carpal tunnel  08/09/22 Dr Lindi Adie (Oncology): breast  cancer - continue letrozole x 7 years (started 11/2021). Daytime fatigue - advised reduce gabapentin to 1 per day.  Hospital visits: 10/20/22 ED visit (AP): hyperglycemia, chest pain. Glucose 411. Improved with Aspart 6 units.   Objective:  Lab Results  Component Value Date   CREATININE 1.30 (H) 10/20/2022   BUN 32 (H) 10/20/2022   GFR 41.23 (L) 06/14/2022   GFRNONAA 43 (L) 10/20/2022   GFRAA 37 (L) 04/12/2020   NA 135 10/20/2022   K 3.8 10/20/2022   CALCIUM 9.7 10/20/2022   CO2 22 10/20/2022   GLUCOSE 426 (H) 10/20/2022    Lab Results  Component Value Date/Time   HGBA1C 6.7 (H) 06/14/2022 07:29 AM   HGBA1C 7.0 (H) 03/16/2022 09:27 AM   GFR 41.23 (L) 06/14/2022 07:29 AM   GFR 39.18 (L) 12/08/2021 07:30 AM   MICROALBUR 5.0 (H) 11/19/2017 10:18 AM    Last diabetic Eye exam:  Lab Results  Component Value Date/Time   HMDIABEYEEXA Retinopathy (A) 12/20/2021 12:00 AM    Last diabetic Foot exam:  Lab Results  Component Value Date/Time   HMDIABFOOTEX done 06/11/2022 12:00 AM     Lab Results  Component Value Date   CHOL 155 06/14/2022   HDL 30.10 (L) 06/14/2022   LDLCALC 61 10/28/2017   LDLDIRECT 89.0 06/14/2022   TRIG 335.0 (H) 06/14/2022   CHOLHDL 5 06/14/2022       Latest Ref Rng & Units 06/14/2022    7:29 AM 12/08/2021    7:30 AM 06/09/2021    8:03 AM  Hepatic Function  Total Protein 6.0 - 8.3 g/dL 7.2  6.5  7.3   Albumin 3.5 - 5.2 g/dL 4.5  4.3  4.8   AST 0 - 37 U/L 20  24  23   $ ALT 0 - 35 U/L 27  25  22   $ Alk Phosphatase 39 - 117 U/L 48  65  48   Total Bilirubin 0.2 - 1.2 mg/dL 0.5  0.5  0.4     Lab Results  Component Value Date/Time   TSH 2.37 12/08/2021 07:30 AM   TSH 2.89 11/18/2020 08:13 AM   FREET4 1.15 12/08/2021 07:30 AM   FREET4 1.22 11/18/2020 08:13 AM       Latest Ref Rng & Units 10/20/2022    7:07 AM 03/16/2022    9:27 AM 12/01/2020    6:35 PM  CBC  WBC 4.0 - 10.5 K/uL 15.0  6.2  8.5   Hemoglobin 12.0 - 15.0 g/dL 13.8  13.5  12.8    Hematocrit 36.0 - 46.0 % 40.6  40.7  38.2   Platelets 150 - 400 K/uL 224  197.0  240     Lab Results  Component Value Date/Time   VD25OH 70.19 03/16/2022 09:27 AM   VD25OH 49.99 02/18/2018 08:22 AM   VITAMINB12 557 03/16/2022 09:27 AM    Clinical ASCVD: No  The 10-year ASCVD risk score (Arnett DK, et al., 2019) is: 50.3%   Values used to calculate the score:     Age: 72 years     Sex: Female     Is Non-Hispanic African American: No     Diabetic: Yes     Tobacco smoker: Yes     Systolic Blood Pressure: A999333 mmHg     Is BP treated: Yes     HDL Cholesterol: 30.1 mg/dL     Total Cholesterol: 155 mg/dL        12/15/2021    9:10 AM 06/15/2021    8:55 AM 02/24/2021    9:48 AM  Depression screen PHQ 2/9  Decreased Interest 2 1 2  $ Down, Depressed, Hopeless 2 1 2  $ PHQ - 2 Score 4 2 4  $ Altered sleeping 3 2 3  $ Tired, decreased energy 2 2 3  $ Change in appetite 0 1 1  Feeling bad or failure about yourself  0 1 1  Trouble concentrating 0 0 0  Moving slowly or fidgety/restless 0 0 0  Suicidal thoughts 0 0 0  PHQ-9 Score 9 8 12  $ Difficult doing work/chores  Not difficult at all Somewhat difficult     Social History   Tobacco Use  Smoking Status Former   Packs/day: 1.00   Years: 15.00   Total pack years: 15.00   Types: Cigarettes  Smokeless Tobacco Never   BP Readings from Last 3 Encounters:  11/14/22 (!) 143/58  10/20/22 (!) 152/72  08/09/22 (!) 150/71   Pulse Readings from Last 3 Encounters:  11/14/22 65  10/20/22 75  08/09/22 69   Wt Readings from Last 3 Encounters:  11/14/22 151 lb 0.2 oz (68.5 kg)  11/08/22 149 lb 8 oz (67.8 kg)  10/20/22 147 lb (66.7 kg)   BMI Readings from Last 3 Encounters:  11/14/22 27.62 kg/m  11/08/22 27.34 kg/m  10/20/22 26.89 kg/m    Allergies  Allergen Reactions   Acetaminophen     Appenzeller    Asa [Aspirin]     Bastone groin and axilla area   Ibuprofen    Other Criss    Mycolog-II &  HRT Hormone patch    Medications Reviewed  Today     Reviewed by Charlton Haws, [facility] (Pharmacist) on 11/16/22 at 1124  Med List Status: <None>   Medication Order Taking? Sig Documenting Provider Last Dose Status Informant  Alcohol Swabs (B-D SINGLE USE SWABS REGULAR) PADS [id number] Yes Use to check blood sugar up to 2 times a day Bedsole, Amy E, MD Taking Active   amLODipine (NORVASC) 5 MG tablet [id number] Yes TAKE 1 TABLET (5 MG TOTAL) BY MOUTH DAILY. Jinny Sanders, MD Taking Active   Blood Glucose Monitoring Suppl (ONETOUCH VERIO REFLECT) w/Device KIT [id number] Yes Use to check blood sugar up to 2 times a day Bedsole, Amy E, MD Taking Active   cetirizine (ZYRTEC) 10 MG tablet [id number] Yes Take 10 mg by mouth at bedtime as needed for allergies. [provider] Taking Active Self  CINNAMON PO [id number] Yes Take 1 tablet by mouth daily. [provider] Taking Active Self  Continuous Blood Gluc Sensor (FREESTYLE LIBRE 2 SENSOR) MISC [id number] Yes APPLY SENSOR EVERY 14 DAYS TO MONITOR SUGAR CONTINOUSLY Bedsole, Amy E, MD Taking Active   Dulaglutide (TRULICITY) A999333 0000000 SOPN [id number] Yes Inject 0.75 mg into the skin once a week. Jinny Sanders, MD Taking Active   gabapentin (NEURONTIN) 100 MG capsule [id number] Yes Take 100 mg by mouth 3 (three) times daily as needed. [provider] Taking Active   glipiZIDE (GLUCOTROL) 5 MG tablet [id number] Yes TAKE 2 TABLETS (10 MG TOTAL) BY MOUTH 2 (TWO) TIMES DAILY BEFORE A MEAL. Jinny Sanders, MD Taking Active   glucose blood (ONETOUCH VERIO) test strip [id number] Yes Use to check blood sugar up to 2 times a day Jinny Sanders, MD Taking Active   Lancets (ONETOUCH DELICA PLUS Q000111Q) MISC [id number] Yes Use to check blood sugar up to 2 times a day Jinny Sanders, MD Taking Active   letrozole [facility]) 2.5 MG tablet [id number] Yes TAKE 1 TABLET BY MOUTH EVERY DAY Nicholas Lose, MD Taking Active   levothyroxine (SYNTHROID) 88 MCG tablet [id number] Yes TAKE 1 TABLET BY  MOUTH EVERY DAY Bedsole, Amy E, MD Taking Active   losartan (COZAAR) 100 MG tablet [id number] Yes TAKE 1 TABLET BY MOUTH EVERY DAY Bedsole, Amy E, MD Taking Active   Omega-3 Fatty Acids (FISH OIL OMEGA-3 PO) [id number] Yes Take 1 capsule by mouth daily. [provider] Taking Active Self  rosuvastatin (CRESTOR) 20 MG tablet [id number] Yes TAKE 1 TABLET BY MOUTH EVERY DAY Bedsole, Amy E, MD Taking Active   tiZANidine (ZANAFLEX) 4 MG tablet [id number] Yes TAKE 1 TABLET BY MOUTH AT BEDTIME AS NEEDED FOR MUSCLE SPASMS. Jinny Sanders, MD Taking Active   traMADol (ULTRAM) 50 MG tablet [id number] Yes Take 1 tablet (50 mg total) by mouth daily as needed. Jinny Sanders, MD Taking Active   Turmeric (QC TUMERIC COMPLEX PO) [id number] Yes Take 1 tablet by mouth daily. [provider] Taking Active Self  vitamin B-12 (CYANOCOBALAMIN) 1000 MCG tablet [id number] Yes Take 1,000 mcg by mouth daily. [provider] Taking Active   VITAMIN D PO [id number] Yes Take 2 each by mouth daily. [provider] Taking Active             SDOH:  (Social Determinants of Health) assessments and interventions performed: No SDOH Interventions    Flowsheet Row Chronic Care Management from 01/15/2022 in [place] [facility] [place] [facility] from 08/18/2021 in [facility] [place] from 08/28/2019 in [place] [facility] [place] from 08/20/2018 in [place] [facility] [place] from 05/09/2017 in [place] [facility] [place] Interventions Intervention Not Indicated Intervention Not Indicated -- -- --  Housing Interventions -- Intervention Not Indicated -- -- --  Transportation Interventions -- Intervention Not Indicated -- -- --  Depression Interventions/Treatment  -- -- [id number] Score <4 Follow-up Not Indicated  --  [referral to PCP] Currently on Treatment  Financial Strain Interventions Intervention Not Indicated Intervention Not Indicated -- -- --       Medication Assistance: None required.  Patient affirms current coverage meets needs.  Medication Access: Within the past 30 days, how often has patient missed a dose of medication? 0 Is a pillbox or other method used to improve adherence? Yes  Factors that may affect medication adherence? no barriers identified Are meds synced by current pharmacy? No  Are meds delivered by current pharmacy? No  Does patient experience delays in picking up medications due to transportation concerns? No   [facility] Services Reviewed: Is patient disadvantaged to use [facility]?: Yes  Current Rx insurance plan: [employer] DM/heart [facility] and location of [id number] [place] [address] [id number]: [phone]: [phone] [facility] services reviewed with patient today?: No  Patient requests to transfer care to [facility]?: No  Reason patient declined to change pharmacies: Disadvantaged due to insurance/mail order  Compliance/Adherence/Medication fill history: Care Gaps: UACR (due 11/2018)  Star-Rating Drugs: Trulicity - PDC [id number] - PDC 100% Losartan - PDC 100% Rosuvastatin - PDC 100%   ASSESSMENT / PLAN  Hypertension (BP goal <140/90) -Not ideally controlled - BP has been elevated in recent OV's (> 140/90), but pt was being seen for acute issues so BP may have only been temporarily elevated; she is not checking BP at home -Current treatment: Amlodipine 5 mg daily - Appropriate, Effective, Safe, Accessible Losartan 100 mg daily - Appropriate, Effective, Safe, Accessible -Medications previously tried: n/a  -Educated on BP goals and benefits of medications for prevention of heart attack, stroke and kidney damage; -Counseled to  monitor BP at home several times weekly; f/u 2-4 weeks -Recommended to continue current medication  Diabetes (A1c goal <7%) -Not ideally controlled - A1c 6.7% (06/2022); reviewed ED visit 10/20/22 for hyperglycemia, BG was > 400. Pt thought this was due to 2 handfuls of chocolate peanuts. Per chart review, pt did have a steroid injection in her wrist for carpal tunnel the day before the admission. Discussed this was likely the cause of the sustained hyperglycemia, not the peanuts. -Current home glucose readings:  Reviewed AGP report: 11/03/22 to 11/16/22. Sensor active: 91%  Time in range (70-180): 58% (goal > 70%)  High (180-250): 37%  Very high (>250): 5%  Low (< 70): 0% (goal < 4%)  GMI: 7.5%; Average glucose: 176  -Previous AGP report: 05/05/22 to 05/18/22. Sensor active: 74%  Time in range (70-180): 83% (goal > 70%)  High (>180): 16%  Low (< 70): 1% (goal < 4%)  GMI: 6.8%; Average glucose: 144  -Diet: pt lives with her daughter who prepares most meals and is not consistently following diabetic diet -Current medications: Glipizide 5 mg - 2 tab BID - Appropriate, Effective, Query Safe Trulicity 0A999333mg  weekly - Appropriate, Effective, Safe, Accessible Freestyle Libre 2 - Appropriate, Effective, Safe, Accessible -Medications previously tried: metformin (SE) -Educated on A1c and blood sugar goals; Reviewed CGM goals (TIR > 70%);  -Discussed effect of steroid (injections and oral) on glucose control; advised that glucose control will need to be closely monitored following future steroid injections -Reviewed CGM report, it does demonstrate somewhat worse control compared to most recent A1c and previous CGM reports; pt may benefit from titrating Trulicity with goal of reducing glipizide in future -Recommend to increase Trulicity to 1.5 mg weekly - consulting with PCP  Hyperlipidemia: (LDL goal < 100) -Controlled - LDL 85 (12/2021) at goal; pt endorses compliance with rosuvastatin, she had bought  it outside of insurance once so has extra supply -Current treatment: Rosuvastatin 20 mg daily - Appropriate, Effective, Safe, Accessible -Medications previously tried: n/a  -Educated on Cholesterol goals; Benefits of statin for ASCVD risk reduction; -Recommended to continue current medication  Breast cancer  -Dx 05/2021, s/p lumpectomy 07/2021, radiation 09/2021-11/2021; follows with oncology (Dr Lindi Adie), planning letrozole for 7 years -pt c/o of significant fatigue since completing radiation; recently moved letrozole to bedtime per advice from oncology, she has not noticed much difference yet -Current treatment  Letrozole 2.5 mg daily HS - Appropriate, Effective, Safe, Accessible Gabapentin 100 mg TID prn (surgical pain) -Appropriate, Effective, Safe, Accessible Tizanidine 4 mg HS prn -Appropriate, Effective, Safe, Accessible -Medications previously tried: n/a  -Recommended to continue current medication  Fatigue (Goal: improve sx) -Improving - pt reports significant fatigue since completing radiation; she has had labwork per PCP that was WNL; sleep study was considered but pt declined; -Pt did start using Zquil ZZZ's supplement (melatonin, chamomile, lavender)  Chronic Kidney Disease Stage 3b  -All medications assessed for renal dosing and appropriateness in chronic kidney disease. 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

## 2022-11-16 NOTE — Telephone Encounter (Signed)
Patient reports glucose control has slipped some and she is interested in higher dose of Trulicity. She is also trying to lose weight. Per CGM report of the past 2 weeks, estimated A1c is 7.5%, which is worse than most recent A1c of 6.7%.  Discussed it would be reasonable to increase Trulicity with goal of decreasing or stopping glipizide in the future.  Recommend to increase Trulicity to 1.5 mg weekly. Routing to PCP for input.    Reviewed AGP report: 11/03/22 to 11/16/22. Sensor active: 91%  Time in range (70-180): 58% (goal > 70%)  High (180-250): 37%  Very high (>250): 5%  Low (< 70): 0% (goal < 4%)  GMI: 7.5%; Average glucose: 176   Lab Results  Component Value Date/Time   HGBA1C 6.7 (H) 06/14/2022 07:29 AM   HGBA1C 7.0 (H) 03/16/2022 09:27 AM   Wt Readings from Last 3 Encounters:  11/14/22 151 lb 0.2 oz (68.5 kg)  11/08/22 149 lb 8 oz (67.8 kg)  10/20/22 147 lb (66.7 kg)   BMI Readings from Last 3 Encounters:  11/14/22 27.62 kg/m  11/08/22 27.34 kg/m  10/20/22 26.89 kg/m

## 2022-11-20 MED ORDER — TRULICITY 1.5 MG/0.5ML ~~LOC~~ SOAJ: 1.5000 mg | SUBCUTANEOUS | 11 refills | Status: DC

## 2022-11-20 NOTE — Telephone Encounter (Signed)
I agree with Trulicity increase  to 1.5 mg weekly.  I have sent the prescription to her local pharmacy in [place].

## 2022-11-21 NOTE — Telephone Encounter (Signed)
Please inform [facility]

## 2022-11-21 NOTE — Telephone Encounter (Cosign Needed)
Patient contacted and notified .   Avel Sensor, [place] Assistant [phone]

## 2022-11-23 ENCOUNTER — Telehealth: Payer: HMO

## 2022-12-04 ENCOUNTER — Other Ambulatory Visit: Payer: Self-pay | Admitting: Family Medicine

## 2022-12-04 ENCOUNTER — Telehealth: Payer: Self-pay

## 2022-12-04 ENCOUNTER — Encounter: Payer: Self-pay | Admitting: Family Medicine

## 2022-12-04 DIAGNOSIS — E1121 Type 2 diabetes mellitus with diabetic nephropathy: Secondary | ICD-10-CM

## 2022-12-04 NOTE — Telephone Encounter (Signed)
Last office visit 06/21/22 for DM.  Last refilled 02/15/22 for #90 with no refills.  Next appt: 12/20/2022 for CPE.

## 2022-12-04 NOTE — Telephone Encounter (Signed)
-----   Message from [place], [facility] sent at 11/16/2022 11:33 AM EST ----- Regarding: BP update Please call pt - need home BP readings from past 2 weeks

## 2022-12-04 NOTE — Progress Notes (Signed)
Contacted the patient for BP log. Patient reports the following over the past 2 weeks:  146/73  142/71  151/80  138/74  132/71   Charlene Brooke, PharmD notified  April Ross, [place] Assistant [phone]

## 2022-12-05 MED ORDER — TRULICITY 0.75 MG/0.5ML ~~LOC~~ SOAJ: 0.7500 mg | SUBCUTANEOUS | 3 refills | Status: DC

## 2022-12-07 ENCOUNTER — Other Ambulatory Visit: Payer: Self-pay | Admitting: Family Medicine

## 2022-12-07 ENCOUNTER — Telehealth: Payer: Self-pay | Admitting: Family Medicine

## 2022-12-07 DIAGNOSIS — E039 Hypothyroidism, unspecified: Secondary | ICD-10-CM

## 2022-12-07 DIAGNOSIS — E1121 Type 2 diabetes mellitus with diabetic nephropathy: Secondary | ICD-10-CM

## 2022-12-07 DIAGNOSIS — N1831 Chronic kidney disease, stage 3a: Secondary | ICD-10-CM

## 2022-12-07 NOTE — Telephone Encounter (Signed)
-----   Message from Velna Hatchet, RT sent at 11/27/2022  2:10 PM EST ----- Regarding: Thu 3/7 lab Lab orders for a 6 month follow up appt

## 2022-12-13 ENCOUNTER — Other Ambulatory Visit (INDEPENDENT_AMBULATORY_CARE_PROVIDER_SITE_OTHER): Payer: PPO

## 2022-12-13 DIAGNOSIS — E1121 Type 2 diabetes mellitus with diabetic nephropathy: Secondary | ICD-10-CM

## 2022-12-13 LAB — LIPID PANEL
Cholesterol: 169 mg/dL (ref 0–200)
HDL: 31.9 mg/dL — ABNORMAL LOW (ref >39.00)
NonHDL: 137.33
Total CHOL/HDL Ratio: 5
Triglycerides: 376 mg/dL — ABNORMAL HIGH (ref 0.0–149.0)
VLDL: 75.2 mg/dL — ABNORMAL HIGH (ref 0.0–40.0)

## 2022-12-13 LAB — COMPREHENSIVE METABOLIC PANEL
ALT: 29 U/L (ref 0–35)
AST: 21 U/L (ref 0–37)
Albumin: 4.4 g/dL (ref 3.5–5.2)
Alkaline Phosphatase: 45 U/L (ref 39–117)
BUN: 30 mg/dL — ABNORMAL HIGH (ref 6–23)
CO2: 26 mEq/L (ref 19–32)
Calcium: 9.7 mg/dL (ref 8.4–10.5)
Chloride: 106 mEq/L (ref 96–112)
Creatinine, Ser: 1.23 mg/dL — ABNORMAL HIGH (ref 0.40–1.20)
GFR: 43.51 mL/min — ABNORMAL LOW (ref >60.00)
Glucose, Bld: 194 mg/dL — ABNORMAL HIGH (ref 70–99)
Potassium: 4.2 mEq/L (ref 3.5–5.1)
Sodium: 141 mEq/L (ref 135–145)
Total Bilirubin: 0.5 mg/dL (ref 0.2–1.2)
Total Protein: 6.8 g/dL (ref 6.0–8.3)

## 2022-12-13 LAB — MICROALBUMIN / CREATININE URINE RATIO
Creatinine,U: 45.1 mg/dL
Microalb Creat Ratio: 59.9 mg/g — ABNORMAL HIGH (ref 0.0–30.0)
Microalb, Ur: 27 mg/dL — ABNORMAL HIGH (ref 0.0–1.9)

## 2022-12-13 LAB — HEMOGLOBIN A1C: Hgb A1c MFr Bld: 7.4 % — ABNORMAL HIGH (ref 4.6–6.5)

## 2022-12-13 LAB — LDL CHOLESTEROL, DIRECT: Direct LDL: 95 mg/dL

## 2022-12-13 NOTE — Progress Notes (Signed)
No critical labs need to be addressed urgently. We will discuss labs in detail at upcoming office visit.   

## 2022-12-17 ENCOUNTER — Ambulatory Visit (INDEPENDENT_AMBULATORY_CARE_PROVIDER_SITE_OTHER): Payer: PPO

## 2022-12-17 VITALS — Ht 62.0 in | Wt 155.0 lb

## 2022-12-17 DIAGNOSIS — Z0000 Encounter for general adult medical examination without abnormal findings: Secondary | ICD-10-CM

## 2022-12-17 NOTE — Progress Notes (Signed)
I connected with  April Ross on 12/17/22 by a audio enabled telemedicine application and verified that I am speaking with the correct person using two identifiers.  Patient Location: Home  Provider Location: Home Office  I discussed the limitations of evaluation and management by telemedicine. The patient expressed understanding and agreed to proceed.  Subjective:   April Ross is a 72 y.o. female who presents for Medicare Annual (Subsequent) preventive examination.  Review of Systems      Cardiac Risk Factors include: advanced age (>69mn, >>50women);hypertension;diabetes mellitus;sedentary lifestyle     Objective:    Today's Vitals   12/17/22 0914 12/17/22 0919  Weight: 155 lb (70.3 kg)   Height: '5\' 2"'$  (1.575 m)   PainSc:  5    Body mass index is 28.35 kg/m.     12/17/2022    9:38 AM 11/14/2022    7:14 AM 11/07/2022    2:24 PM 10/20/2022    6:28 AM 11/20/2021    7:59 AM 08/17/2021    7:38 AM 07/24/2021    9:48 AM  Advanced Directives  Does Patient Have a Medical Advance Directive? No No No No No Yes Yes  Type of Advance Directive      Living will [place] Does patient want to make changes to medical advance directive?       No - Patient declined  Copy of [place] Chart?       No - copy requested  Would patient like information on creating a medical advance directive? No - Patient declined No - Patient declined No - Patient declined No - Patient declined No - Patient declined No - Patient declined No - Patient declined    Current Medications (verified) Outpatient Encounter Medications as of 12/17/2022  Medication Sig   amLODipine (NORVASC) 5 MG tablet TAKE 1 TABLET (5 MG TOTAL) BY MOUTH DAILY.   APPLE CIDER VINEGAR PO Take by mouth.   cetirizine (ZYRTEC) 10 MG tablet Take 10 mg by mouth at bedtime as needed for allergies.   CINNAMON PO Take 1 tablet by mouth daily.   Dulaglutide (TRULICITY) [id number] Inject  0.75 mg into the skin once a week.   gabapentin (NEURONTIN) 100 MG capsule Take 100 mg by mouth 3 (three) times daily as needed.   glipiZIDE (GLUCOTROL) 5 MG tablet TAKE 2 TABLETS (10 MG TOTAL) BY MOUTH 2 (TWO) TIMES DAILY BEFORE A MEAL.   letrozole (FEMARA) 2.5 MG tablet TAKE 1 TABLET BY MOUTH EVERY DAY   levothyroxine (SYNTHROID) 88 MCG tablet TAKE 1 TABLET BY MOUTH EVERY DAY   losartan (COZAAR) 100 MG tablet TAKE 1 TABLET BY MOUTH EVERY DAY   NON FORMULARY Ginsing root   Omega-3 Fatty Acids (FISH OIL OMEGA-3 PO) Take 1 capsule by mouth daily.   OVER THE COUNTER MEDICATION Liver gallbladder support tablets   OVER THE COUNTER MEDICATION Leg and back pain relief tablets   PREBIOTIC PRODUCT PO Take by mouth.   rosuvastatin (CRESTOR) 20 MG tablet TAKE 1 TABLET BY MOUTH EVERY DAY   tiZANidine (ZANAFLEX) 4 MG tablet TAKE 1 TABLET BY MOUTH AT BEDTIME AS NEEDED FOR MUSCLE SPASMS.   traMADol (ULTRAM) 50 MG tablet Take 1 tablet (50 mg total) by mouth daily as needed.   Turmeric (QC TUMERIC COMPLEX PO) Take 1 tablet by mouth daily.   vitamin B-12 (CYANOCOBALAMIN) 1000 MCG tablet Take 1,000 mcg by mouth daily.   VITAMIN D PO Take  2 each by mouth daily.   Alcohol Swabs (B-D SINGLE USE SWABS REGULAR) PADS Use to check blood sugar up to 2 times a day   Blood Glucose Monitoring Suppl (ONETOUCH VERIO REFLECT) w/Device KIT Use to check blood sugar up to 2 times a day   Continuous Blood Gluc Sensor (FREESTYLE LIBRE 2 SENSOR) MISC APPLY SENSOR EVERY 14 DAYS TO MONITOR SUGAR CONTINOUSLY   glucose blood (ONETOUCH VERIO) test strip Use to check blood sugar up to 2 times a day   Lancets (ONETOUCH DELICA PLUS [id number]) MISC Use to check blood sugar up to 2 times a day   No facility-administered encounter medications on file as of 12/17/2022.    Allergies (verified) Acetaminophen, Asa [aspirin], Ibuprofen, and Other   History: Past Medical History:  Diagnosis Date   Allergy    Arthritis    Breast cancer  ([place])    right side 07/2021   Cancer ([place])    CRD (chronic renal disease)    stage 3   Depression    Diabetes mellitus without complication (HCC)    GERD (gastroesophageal reflux disease)    Hyperlipidemia    Hypertension    LBBB (left bundle branch block)    a. diagnosed in 10/2017 - echo showed a preserved EF of 55-60% and NST was low-risk showing no evidence of ischemia.    Osteoporosis    polyps in colon    Thyroid disease    Urinary tract infection    Past Surgical History:  Procedure Laterality Date   ABDOMINAL HYSTERECTOMY     APPENDECTOMY     BREAST BIOPSY Right    BREAST LUMPECTOMY Right 07/2021   BREAST LUMPECTOMY WITH RADIOACTIVE SEED AND SENTINEL LYMPH NODE BIOPSY Right 07/24/2021   Procedure: RIGHT BREAST LUMPECTOMY WITH RADIOACTIVE SEED AND SENTINEL LYMPH NODE BIOPSY;  Surgeon: Jovita Kussmaul, MD;  Location: [facility];  Service: General;  Laterality: Right;   CARPAL TUNNEL RELEASE Right 11/14/2022   Procedure: CARPAL TUNNEL RELEASE;  Surgeon: Sherilyn Cooter, MD;  Location: [facility];  Service: Orthopedics;  Laterality: Right;  or MAC with local   Family History  Problem Relation Age of Onset   Alzheimer's disease Mother    Hyperlipidemia Mother    Hypertension Mother    Asthma Maternal Grandmother    Diabetes Maternal Grandfather    Alcohol abuse Maternal Grandfather    Hyperlipidemia Maternal Grandfather    Stroke Maternal Grandfather    Hypertension Maternal Grandfather    Diabetes Paternal Grandmother    Alcohol abuse Maternal Uncle    Cholecystitis Maternal Uncle    Arthritis Maternal Uncle    Social History   Socioeconomic History   Marital status: Widowed    Spouse name: Not on file   Number of children: Not on file   Years of education: Not on file   Highest education level: Not on file  Occupational History   Not on file  Tobacco Use   Smoking status: Former    Packs/day: 1.00    Years: 15.00    Total  pack years: 15.00    Types: Cigarettes   Smokeless tobacco: Never  Vaping Use   Vaping Use: Never used  Substance and Sexual Activity   Alcohol use: Yes    Alcohol/week: 0.0 standard drinks of alcohol    Comment: occ glass of wine or mix drink   Drug use: No   Sexual activity: Not Currently  Other Topics Concern   Not  on file  Social History Narrative   Moved from [place] 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    Social Determinants of Health   Financial Resource Strain: Low Risk  (12/17/2022)   Overall Financial Resource Strain (CARDIA)    Difficulty of Paying Living Expenses: Not hard at all  Food Insecurity: No Food Insecurity (12/17/2022)   Hunger Vital Sign    Worried About Running Out of Food in the Last Year: Never true    Ran Out of Food in the Last Year: Never true  Transportation Needs: No Transportation Needs (12/17/2022)   PRAPARE - Hydrologist (Medical): No    Lack of Transportation (Non-Medical): No  Physical Activity: Inactive (12/17/2022)   Exercise Vital Sign    Days of Exercise per Week: 0 days    Minutes of Exercise per Session: 0 min  Stress: No Stress Concern Present (12/17/2022)   [place]    Feeling of Stress : Only a little  Social Connections: Socially Isolated (12/17/2022)   Social Connection and Isolation Panel [NHANES]    Frequency of Communication with Friends and Family: More than three times a week    Frequency of Social Gatherings with Friends and Family: More than three times a week    Attends Religious Services: Never    Marine scientist or Organizations: No    Attends Archivist Meetings: Never    Marital Status: Widowed    Tobacco Counseling Counseling given: Not Answered   Clinical Intake:  Pre-visit preparation completed: Yes  Pain : 0-10 Pain Score: 5  Pain Type: Acute  pain Pain Location: Back Pain Descriptors / Indicators: Aching Pain Onset: 1 to 4 weeks ago     Nutritional Risks: None Diabetes: Yes CBG done?: No CBG resulted in Enter/ Edit results?: No Did pt. bring in CBG monitor from home?: No (continuous monitor)  How often do you need to have someone help you when you read instructions, pamphlets, or other written materials from your doctor or pharmacy?: 1 - Never  Diabetic?Nutrition Risk Assessment:  Has the patient had any N/V/D within the last 2 months?  No  Does the patient have any non-healing wounds?  No  Has the patient had any unintentional weight loss or weight gain?   Gained a little after quitting smoking.  Diabetes:  Is the patient diabetic?  Yes  If diabetic, was a CBG obtained today?  No  Did the patient bring in their glucometer from home?  No  How often do you monitor your CBG's? Wears continuous monitor.   Financial Strains and Diabetes Management:  Are you having any financial strains with the device, your supplies or your medication? No .  Does the patient want to be seen by Chronic Care Management for management of their diabetes?  No  Would the patient like to be referred to a Nutritionist or for Diabetic Management?  No   Diabetic Exams:  Diabetic Eye Exam: Completed last seen 12/20/21 has appt. 12/24/22  Dr.Digby Diabetic Foot Exam: Completed 06/11/22 per pt    Interpreter Needed?: No  Information entered by :: C.Alberta Lenhard LPN   Activities of Daily Living    12/17/2022    9:39 AM 11/14/2022    7:18 AM  In your present state of health, do you have any difficulty performing the following activities:  Hearing? 0 0  Vision? 0 0  Difficulty concentrating or making decisions? 0 0  Walking or climbing stairs? 0 0  Dressing or bathing? 0 0  Doing errands, shopping? 0   Preparing Food and eating ? N   Using the Toilet? N   In the past six months, have you accidently leaked urine? Y   Comment when sneezes or  cough   Do you have problems with loss of bowel control? N   Managing your Medications? N   Managing your Finances? N   Housekeeping or managing your Housekeeping? N     Patient Care Team: Jinny Sanders, MD as PCP - General (Family Medicine) Richard Miu, DMD as Consulting Physician (Dentistry) Nicholas Lose, MD as Consulting Physician (Hematology and Oncology) Charlton Haws, [facility] as Pharmacist (Pharmacist) Jovita Kussmaul, MD as Consulting Physician (General Surgery) Kyung Rudd, MD as Consulting Physician (Radiation Oncology)  Indicate any recent Medical Services you may have received from other than [facility] providers in the past year (date may be approximate).     Assessment:   This is a routine wellness examination for [place].  Hearing/Vision screen Hearing Screening - Comments:: No aids Vision Screening - Comments:: Glasses - Dr.Digby  Dietary issues and exercise activities discussed: Current Exercise Habits: The patient does not participate in regular exercise at present, Exercise limited by: None identified   Goals Addressed             This Visit's Progress    Patient Stated       Get off some medication and lose 30 lbs.       Depression Screen    12/17/2022    9:37 AM 12/15/2021    9:10 AM 06/15/2021    8:55 AM 02/24/2021    9:48 AM 11/24/2020    9:34 AM 08/28/2019   10:13 AM 05/05/2019    9:09 AM  PHQ 2/9 Scores  PHQ - 2 Score '1 4 2 4 6 '$ 0 3  PHQ- 9 Score  '9 8 12 17 '$ 0 9    Fall Risk    12/17/2022    9:39 AM 12/15/2021    8:27 AM 11/24/2020    8:26 AM 08/28/2019   10:12 AM 08/20/2018   10:21 AM  Fall Risk   Falls in the past year? 0 0 '1 1 1  '$ Comment     2 falls due to tripping over small dog; denies injury  Number falls in past yr: 0  '1 1 1  '$ Injury with Fall? 0  1 0 0  Risk for fall due to : No Fall Risks   History of fall(s)   Follow up Falls prevention discussed;Falls evaluation completed   Falls evaluation completed;Falls prevention discussed      FALL RISK PREVENTION PERTAINING TO THE HOME:  Any stairs in or around the home? Yes  If so, are there any without handrails? No  Home free of loose throw rugs in walkways, pet beds, electrical cords, etc? Yes  Adequate lighting in your home to reduce risk of falls? Yes   ASSISTIVE DEVICES UTILIZED TO PREVENT FALLS:  Life alert? No  Use of a cane, walker or w/c? No  Grab bars in the bathroom? No  Shower chair or bench in shower? Yes  Elevated toilet seat or a handicapped toilet? No    Cognitive Function:    08/28/2019   10:16 AM 08/20/2018   10:21 AM 05/09/2017    9:15 AM  MMSE - Mini Mental State Exam  Orientation to time  $'5 5 5  'Z$ Orientation to Place '5 5 5  '$ Registration '3 3 3  '$ Attention/ Calculation 5 0 0  Recall '3 3 3  '$ Language- name 2 objects  0 0  Language- repeat '1 1 1  '$ Language- follow 3 step command  3 3  Language- read & follow direction  0 0  Write a sentence  0 0  Copy design  0 0  Total score  20 20        12/17/2022    9:40 AM  6CIT Screen  What Year? 0 points  What month? 0 points  What time? 0 points  Count back from 20 0 points  Months in reverse 0 points  Repeat phrase 0 points  Total Score 0 points    Immunizations Immunization History  Administered Date(s) Administered   Fluad Quad(high Dose 65+) 07/17/2019, 10/03/2020, 06/15/2021   Influenza, High Dose Seasonal PF 07/16/2017, 08/01/2018, 07/30/2022   Influenza,inj,Quad PF,6+ Mos 06/05/2016   Moderna Sars-Covid-2 Vaccination 07/08/2020, 08/06/2020   PPD Test 12/26/2016   Pneumococcal Conjugate-13 02/25/2014   Pneumococcal Polysaccharide-23 08/04/2009, 05/08/2016   Tdap 06/05/2016   Zoster, Live 08/13/2012    TDAP status: Up to date  Flu Vaccine status: Up to date  Pneumococcal vaccine status: Up to date  Covid-19 vaccine status: Declined, Education has been provided regarding the importance of this vaccine but patient still declined. Advised may receive this vaccine at local  pharmacy or Health Dept.or vaccine clinic. Aware to provide a copy of the vaccination record if obtained from local pharmacy or Health Dept. Verbalized acceptance and understanding.  Qualifies for Shingles Vaccine? Yes   Zostavax completed  unknown   Shingrix Completed?: No.    Education has been provided regarding the importance of this vaccine. Patient has been advised to call insurance company to determine out of pocket expense if they have not yet received this vaccine. Advised may also receive vaccine at local pharmacy or Health Dept. Verbalized acceptance and understanding.  Screening Tests Health Maintenance  Topic Date Due   Zoster Vaccines- Shingrix (1 of 2) Never done   COVID-19 Vaccine (3 - Moderna risk series) 09/03/2020   OPHTHALMOLOGY EXAM  12/21/2022   DEXA SCAN  04/29/2023   FOOT EXAM  06/12/2023   HEMOGLOBIN A1C  06/15/2023   Diabetic kidney evaluation - eGFR measurement  12/13/2023   Diabetic kidney evaluation - Urine ACR  12/13/2023   Medicare Annual Wellness (AWV)  12/17/2023   MAMMOGRAM  05/17/2024   COLONOSCOPY (Pts 45-13yr Insurance coverage will need to be confirmed)  03/23/2025   DTaP/Tdap/Td (2 - Td or Tdap) 06/05/2026   Pneumonia Vaccine 72 Years old  Completed   INFLUENZA VACCINE  Completed   Hepatitis C Screening  Completed   HPV VACCINES  Aged Out    Health Maintenance  Health Maintenance Due  Topic Date Due   Zoster Vaccines- Shingrix (1 of 2) Never done   COVID-19 Vaccine (3 - Moderna risk series) 09/03/2020    Colorectal cancer screening: Type of screening: Colonoscopy. Completed 03/24/2015. Repeat every 10 years  Mammogram status: Completed 05/17/2022. Repeat every year  Bone Density status: Completed 04/28/21. Results reflect: Bone density results: OSTEOPENIA. Repeat every 2 years.  Lung Cancer Screening: (Low Dose CT Chest recommended if Age 731-80years, 30 pack-year currently smoking OR have quit w/in 15years.) does  Lung Cancer  Screening Referral: will talk with PCP  Additional Screening:  Hepatitis C Screening: does not qualify; Completed 05/03/16  Vision Screening:  Recommended annual ophthalmology exams for early detection of glaucoma and other disorders of the eye. Is the patient up to date with their annual eye exam?  Yes  Who is the provider or what is the name of the office in which the patient attends annual eye exams? [facility] If pt is not established with a provider, would they like to be referred to a provider to establish care? No .   Dental Screening: Recommended annual dental exams for proper oral hygiene  Community Resource Referral / Chronic Care Management: CRR required this visit?  No   CCM required this visit?  No      Plan:     I have personally reviewed and noted the following in the patient's chart:   Medical and social history Use of alcohol, tobacco or illicit drugs  Current medications and supplements including opioid prescriptions. Patient is currently taking opioid prescriptions. Information provided to patient regarding non-opioid alternatives. Patient advised to discuss non-opioid treatment plan with their provider. Functional ability and status Nutritional status Physical activity Advanced directives List of other physicians Hospitalizations, surgeries, and ER visits in previous 12 months Vitals Screenings to include cognitive, depression, and falls Referrals and appointments  In addition, I have reviewed and discussed with patient certain preventive protocols, quality metrics, and best practice recommendations. A written personalized care plan for preventive services as well as general preventive health recommendations were provided to patient.     Lebron Conners, LPN   [id number]   Nurse Notes: none

## 2022-12-17 NOTE — Patient Instructions (Signed)
April Ross , Thank you for taking time to come for your Medicare Wellness Visit. I appreciate your ongoing commitment to your health goals. Please review the following plan we discussed and let me know if I can assist you in the future.   These are the goals we discussed:  Goals      Monitor and Manage My Blood Sugar-Diabetes Type 2     Timeframe:  Long-Range Goal Priority:  Medium Start Date:    01/16/22                         Expected End Date:    01/17/23                   Follow Up Date Aug 2023   - check blood sugar at prescribed times using [employer]  - take the blood sugar meter to all doctor visits    Why is this important?   Checking your blood sugar at home helps to keep it from getting very high or very low.  Writing the results in a diary or log helps the doctor know how to care for you.  Your blood sugar log should have the time, date and the results.  Also, write down the amount of insulin or other medicine that you take.  Other information, like what you ate, exercise done and how you were feeling, will also be helpful.     Notes:      Patient Stated     Starting 11/31/2019, I will monitor and reduce intake of simple carbohydrates in an effort to lose 25 lbs by November 2020.     Patient Stated     08/28/2019, I will try to continue working on losing some weight      Patient Stated     Get off some medication and lose 30 lbs.        This is a list of the screening recommended for you and due dates:  Health Maintenance  Topic Date Due   Zoster (Shingles) Vaccine (1 of 2) Never done   COVID-19 Vaccine (3 - Moderna risk series) 09/03/2020   Eye exam for diabetics  12/21/2022   DEXA scan (bone density measurement)  04/29/2023   Complete foot exam   06/12/2023   Hemoglobin A1C  06/15/2023   Yearly kidney function blood test for diabetes  12/13/2023   Yearly kidney health urinalysis for diabetes  12/13/2023   Medicare Annual Wellness Visit  12/17/2023    Mammogram  05/17/2024   Colon Cancer Screening  03/23/2025   DTaP/Tdap/Td vaccine (2 - Td or Tdap) 06/05/2026   Pneumonia Vaccine  Completed   Flu Shot  Completed   Hepatitis C Screening: USPSTF Recommendation to screen - Ages 23-79 yo.  Completed   HPV Vaccine  Aged Out   Opioid Pain Medicine Management Opioids are powerful medicines that are used to treat moderate to severe pain. When used for short periods of time, they can help you to: Sleep better. Do better in physical or occupational therapy. Feel better in the first few days after an injury. Recover from surgery. Opioids should be taken with the supervision of a trained health care provider. They should be taken for the shortest period of time possible. This is because opioids can be addictive, and the longer you take opioids, the greater your risk of addiction. This addiction can also be called opioid use disorder. What are the risks? Using  opioid pain medicines for longer than 3 days increases your risk of side effects. Side effects include: Constipation. Nausea and vomiting. Breathing difficulties (respiratory depression). Drowsiness. Confusion. Opioid use disorder. Itching. Taking opioid pain medicine for a long period of time can affect your ability to do daily tasks. It also puts you at risk for: Motor vehicle crashes. Depression. Suicide. Heart attack. Overdose, which can be life-threatening. What is a pain treatment plan? A pain treatment plan is an agreement between you and your health care provider. Pain is unique to each person, and treatments vary depending on your condition. To manage your pain, you and your health care provider need to work together. To help you do this: Discuss the goals of your treatment, including how much pain you might expect to have and how you will manage the pain. Review the risks and benefits of taking opioid medicines. Remember that a good treatment plan uses more than one approach  and minimizes the chance of side effects. Be honest about the amount of medicines you take and about any drug or alcohol use. Get pain medicine prescriptions from only one health care provider. Pain can be managed with many types of alternative treatments. Ask your health care provider to refer you to one or more specialists who can help you manage pain through: Physical or occupational therapy. Counseling (cognitive behavioral therapy). Good nutrition. Biofeedback. Massage. Meditation. Non-opioid medicine. Following a gentle exercise program. How to use opioid pain medicine Taking medicine Take your pain medicine exactly as told by your health care provider. Take it only when you need it. If your pain gets less severe, you may take less than your prescribed dose if your health care provider approves. If you are not having pain, do nottake pain medicine unless your health care provider tells you to take it. If your pain is severe, do nottry to treat it yourself by taking more pills than instructed on your prescription. Contact your health care provider for help. Write down the times when you take your pain medicine. It is easy to become confused while on pain medicine. Writing the time can help you avoid overdose. Take other over-the-counter or prescription medicines only as told by your health care provider. Keeping yourself and others safe  While you are taking opioid pain medicine: Do not drive, use machinery, or power tools. Do not sign legal documents. Do not drink alcohol. Do not take sleeping pills. Do not supervise children by yourself. Do not do activities that require climbing or being in high places. Do not go to a lake, river, ocean, spa, or swimming pool. Do not share your pain medicine with anyone. Keep pain medicine in a locked cabinet or in a secure area where pets and children cannot reach it. Stopping your use of opioids If you have been taking opioid medicine for  more than a few weeks, you may need to slowly decrease (taper) how much you take until you stop completely. Tapering your use of opioids can decrease your risk of symptoms of withdrawal, such as: Pain and cramping in the abdomen. Nausea. Sweating. Sleepiness. Restlessness. Uncontrollable shaking (tremors). Cravings for the medicine. Do not attempt to taper your use of opioids on your own. Talk with your health care provider about how to do this. Your health care provider may prescribe a step-down schedule based on how much medicine you are taking and how long you have been taking it. Getting rid of leftover pills Do not save any leftover pills. Get  rid of leftover pills safely by: Taking the medicine to a prescription take-back program. This is usually offered by the county or law enforcement. Bringing them to a pharmacy that has a drug disposal container. Flushing them down the toilet. Check the label or package insert of your medicine to see whether this is safe to do. Throwing them out in the trash. Check the label or package insert of your medicine to see whether this is safe to do. If it is safe to throw it out, remove the medicine from the original container, put it into a sealable bag or container, and mix it with used coffee grounds, food scraps, dirt, or cat litter before putting it in the trash. Follow these instructions at home: Activity Do exercises as told by your health care provider. Avoid activities that make your pain worse. Return to your normal activities as told by your health care provider. Ask your health care provider what activities are safe for you. General instructions You may need to take these actions to prevent or treat constipation: Drink enough fluid to keep your urine pale yellow. Take over-the-counter or prescription medicines. Eat foods that are high in fiber, such as beans, whole grains, and fresh fruits and vegetables. Limit foods that are high in fat and  processed sugars, such as fried or sweet foods. Keep all follow-up visits. This is important. Where to find support If you have been taking opioids for a long time, you may benefit from receiving support for quitting from a local support group or counselor. Ask your health care provider for a referral to these resources in your area. Where to find more information Centers for Disease Control and Prevention (CDC): http://www.wolf.info/ [place] Food and Drug Administration (FDA): [place] Get help right away if: You may have taken too much of an opioid (overdosed). Common symptoms of an overdose: Your breathing is slower or more shallow than normal. You have a very slow heartbeat (pulse). You have slurred speech. You have nausea and vomiting. Your pupils become very small. You have other potential symptoms: You are very confused. You faint or feel like you will faint. You have cold, clammy skin. You have blue lips or fingernails. You have thoughts of harming yourself or harming others. These symptoms may represent a serious problem that is an emergency. Do not wait to see if the symptoms will go away. Get medical help right away. Call your local emergency services (911 in the [place] Do not drive yourself to the hospital.  If you ever feel like you may hurt yourself or others, or have thoughts about taking your own life, get help right away. Go to your nearest emergency department or: Call your local emergency services (911 in the [place] Call the [facility] (763)089-4669 in the [place] Call a suicide crisis helpline, such as the [facility] at (469)720-2890 or 988 in the [place]. This is open 24 hours a day in the [place] Text the Crisis Text Line at 4250494825 (in the [place].). Summary Opioid medicines can help you manage moderate to severe pain for a short period of time. A pain treatment plan is an agreement between you and your health care provider. Discuss the  goals of your treatment, including how much pain you might expect to have and how you will manage the pain. If you think that you or someone else may have taken too much of an opioid, get medical help right away. This information is not intended to replace  advice given to you by your health care provider. Make sure you discuss any questions you have with your health care provider. Document Revised: 04/19/2021 Document Reviewed: 01/04/2021 Elsevier Patient Education  [place] directives: Advance directive discussed with you today. Even though you declined this today, please call our office should you change your mind, and we can give you the proper paperwork for you to fill out.   Conditions/risks identified: Aim for 30 minutes of exercise or brisk walking, 6-8 glasses of water, and 5 servings of fruits and vegetables each day.   Next appointment: Follow up in one year for your annual wellness visit. 12/23/23 @ 9:15 via telephone  Preventive Care 65 Years and Older, Female  Preventive care refers to lifestyle choices and visits with your health care provider that can promote health and wellness. What does preventive care include? A yearly physical exam. This is also called an annual well check. Dental exams once or twice a year. Routine eye exams. Ask your health care provider how often you should have your eyes checked. Personal lifestyle choices, including: Daily care of your teeth and gums. Regular physical activity. Eating a healthy diet. Avoiding tobacco and drug use. Limiting alcohol use. Practicing safe sex. Taking low doses of aspirin every day. Taking vitamin and mineral supplements as recommended by your health care provider. What happens during an annual well check? The services and screenings done by your health care provider during your annual well check will depend on your age, overall health, lifestyle risk factors, and family history of  disease. Counseling  Your health care provider may ask you questions about your: Alcohol use. Tobacco use. Drug use. Emotional well-being. Home and relationship well-being. Sexual activity. Eating habits. History of falls. Memory and ability to understand (cognition). Work and work Statistician. Screening  You may have the following tests or measurements: Height, weight, and BMI. Blood pressure. Lipid and cholesterol levels. These may be checked every 5 years, or more frequently if you are over 49 years old. Skin check. [place] cancer screening. You may have this screening every year starting at age 30 if you have a 30-pack-year history of smoking and currently smoke or have quit within the past 15 years. Fecal occult blood test (FOBT) of the stool. You may have this test every year starting at age 7. Flexible sigmoidoscopy or colonoscopy. You may have a sigmoidoscopy every 5 years or a colonoscopy every 10 years starting at age 59. Prostate cancer screening. Recommendations will vary depending on your family history and other risks. Hepatitis C blood test. Hepatitis B blood test. Sexually transmitted disease (STD) testing. Diabetes screening. This is done by checking your blood sugar (glucose) after you have not eaten for a while (fasting). You may have this done every 1-3 years. Abdominal aortic aneurysm (AAA) screening. You may need this if you are a current or former smoker. Osteoporosis. You may be screened starting at age 67 if you are at high risk. Talk with your health care provider about your test results, treatment options, and if necessary, the need for more tests. Vaccines  Your health care provider may recommend certain vaccines, such as: Influenza vaccine. This is recommended every year. Tetanus, diphtheria, and acellular pertussis (Tdap, Td) vaccine. You may need a Td booster every 10 years. Zoster vaccine. You may need this after age 24. Pneumococcal 13-valent  conjugate (PCV13) vaccine. One dose is recommended after age 24. Pneumococcal polysaccharide (PPSV23) vaccine. One dose is recommended after  age 75. Talk to your health care provider about which screenings and vaccines you need and how often you need them. This information is not intended to replace advice given to you by your health care provider. Make sure you discuss any questions you have with your health care provider. Document Released: 10/21/2015 Document Revised: 06/13/2016 Document Reviewed: 07/26/2015 Elsevier Interactive Patient Education  2017 [place] Prevention in the Home Falls can cause injuries. They can happen to people of all ages. There are many things you can do to make your home safe and to help prevent falls. What can I do on the outside of my home? Regularly fix the edges of walkways and driveways and fix any cracks. Remove anything that might make you trip as you walk through a door, such as a raised step or threshold. Trim any bushes or trees on the path to your home. Use bright outdoor lighting. Clear any walking paths of anything that might make someone trip, such as rocks or tools. Regularly check to see if handrails are loose or broken. Make sure that both sides of any steps have handrails. Any raised decks and porches should have guardrails on the edges. Have any leaves, snow, or ice cleared regularly. Use sand or salt on walking paths during winter. Clean up any spills in your garage right away. This includes oil or grease spills. What can I do in the bathroom? Use night lights. Install grab bars by the toilet and in the tub and shower. Do not use towel bars as grab bars. Use non-skid mats or decals in the tub or shower. If you need to sit down in the shower, use a plastic, non-slip stool. Keep the floor dry. Clean up any water that spills on the floor as soon as it happens. Remove soap buildup in the tub or shower regularly. Attach bath mats  securely with double-sided non-slip rug tape. Do not have throw rugs and other things on the floor that can make you trip. What can I do in the bedroom? Use night lights. Make sure that you have a light by your bed that is easy to reach. Do not use any sheets or blankets that are too big for your bed. They should not hang down onto the floor. Have a firm chair that has side arms. You can use this for support while you get dressed. Do not have throw rugs and other things on the floor that can make you trip. What can I do in the kitchen? Clean up any spills right away. Avoid walking on wet floors. Keep items that you use a lot in easy-to-reach places. If you need to reach something above you, use a strong step stool that has a grab bar. Keep electrical cords out of the way. Do not use floor polish or wax that makes floors slippery. If you must use wax, use non-skid floor wax. Do not have throw rugs and other things on the floor that can make you trip. What can I do with my stairs? Do not leave any items on the stairs. Make sure that there are handrails on both sides of the stairs and use them. Fix handrails that are broken or loose. Make sure that handrails are as long as the stairways. Check any carpeting to make sure that it is firmly attached to the stairs. Fix any carpet that is loose or worn. Avoid having throw rugs at the top or bottom of the stairs. If you do have  throw rugs, attach them to the floor with carpet tape. Make sure that you have a light switch at the top of the stairs and the bottom of the stairs. If you do not have them, ask someone to add them for you. What else can I do to help prevent falls? Wear shoes that: Do not have high heels. Have rubber bottoms. Are comfortable and fit you well. Are closed at the toe. Do not wear sandals. If you use a stepladder: Make sure that it is fully opened. Do not climb a closed stepladder. Make sure that both sides of the stepladder  are locked into place. Ask someone to hold it for you, if possible. Clearly mark and make sure that you can see: Any grab bars or handrails. First and last steps. Where the edge of each step is. Use tools that help you move around (mobility aids) if they are needed. These include: Canes. Walkers. Scooters. Crutches. Turn on the lights when you go into a dark area. Replace any light bulbs as soon as they burn out. Set up your furniture so you have a clear path. Avoid moving your furniture around. If any of your floors are uneven, fix them. If there are any pets around you, be aware of where they are. Review your medicines with your doctor. Some medicines can make you feel dizzy. This can increase your chance of falling. Ask your doctor what other things that you can do to help prevent falls. This information is not intended to replace advice given to you by your health care provider. Make sure you discuss any questions you have with your health care provider. Document Released: 07/21/2009 Document Revised: 03/01/2016 Document Reviewed: 10/29/2014 Elsevier Interactive Patient Education  2017 [employer].

## 2022-12-20 ENCOUNTER — Ambulatory Visit (INDEPENDENT_AMBULATORY_CARE_PROVIDER_SITE_OTHER): Payer: PPO | Admitting: Family Medicine

## 2022-12-20 ENCOUNTER — Encounter: Payer: Self-pay | Admitting: Family Medicine

## 2022-12-20 VITALS — BP 130/60 | HR 69 | Temp 99.0°F | Ht 62.0 in | Wt 154.2 lb

## 2022-12-20 DIAGNOSIS — Z0000 Encounter for general adult medical examination without abnormal findings: Secondary | ICD-10-CM | POA: Diagnosis not present

## 2022-12-20 DIAGNOSIS — E785 Hyperlipidemia, unspecified: Secondary | ICD-10-CM | POA: Diagnosis not present

## 2022-12-20 DIAGNOSIS — F321 Major depressive disorder, single episode, moderate: Secondary | ICD-10-CM | POA: Diagnosis not present

## 2022-12-20 DIAGNOSIS — M810 Age-related osteoporosis without current pathological fracture: Secondary | ICD-10-CM

## 2022-12-20 DIAGNOSIS — E1121 Type 2 diabetes mellitus with diabetic nephropathy: Secondary | ICD-10-CM

## 2022-12-20 DIAGNOSIS — E039 Hypothyroidism, unspecified: Secondary | ICD-10-CM

## 2022-12-20 DIAGNOSIS — E1159 Type 2 diabetes mellitus with other circulatory complications: Secondary | ICD-10-CM | POA: Diagnosis not present

## 2022-12-20 DIAGNOSIS — I152 Hypertension secondary to endocrine disorders: Secondary | ICD-10-CM | POA: Diagnosis not present

## 2022-12-20 DIAGNOSIS — C50211 Malignant neoplasm of upper-inner quadrant of right female breast: Secondary | ICD-10-CM | POA: Diagnosis not present

## 2022-12-20 DIAGNOSIS — M545 Low back pain, unspecified: Secondary | ICD-10-CM | POA: Diagnosis not present

## 2022-12-20 DIAGNOSIS — N1831 Chronic kidney disease, stage 3a: Secondary | ICD-10-CM | POA: Diagnosis not present

## 2022-12-20 DIAGNOSIS — Z170 Estrogen receptor positive status [ER+]: Secondary | ICD-10-CM

## 2022-12-20 DIAGNOSIS — E1169 Type 2 diabetes mellitus with other specified complication: Secondary | ICD-10-CM

## 2022-12-20 NOTE — Assessment & Plan Note (Signed)
Chronic, improving control Associated with diabetes. 

## 2022-12-20 NOTE — Progress Notes (Signed)
Patient ID: April Ross, female    DOB: October 06, 1951, 73 y.o.   MRN: [id number]  This visit was conducted in person.  BP 130/60   Pulse 69   Temp 99 F (37.2 C) (Oral)   Ht '5\' 2"'$  (1.575 m)   Wt 154 lb 4 oz (70 kg)   SpO2 97%   BMI 28.21 kg/m    CC:  Chief Complaint  Patient presents with   Annual Exam    Part 2    Subjective:   HPI: April Ross is a 72 y.o. female presenting on 12/20/2022 for Annual Exam (Part 2)  The patient presents for annual medicare wellness, complete physical and review of chronic health problems. He/She also has the following acute concerns today:  She has  had a flare of loew back apin.  Occurred after on feet on 12/13/22 all day.  No fall.  Pain is better with movement. No radiation of pain.  No new numbness or weakness in legs.  No fever.  She has been treating with  leftover oxycodone off and on at night to help sleep.    Had carpal tunnel surgery  in 11/2022    1. The patient's medical and social history 2. Their use of alcohol, tobacco or illicit drugs 3. Their current medications and supplements 4. The patient's functional ability including ADL's, fall risks, home safety risks and hearing or visual             impairment. 5. Diet and physical activities 6. Evidence for depression or mood disorders 7.         Updated provider list Cognitive evaluation was performed and recorded on pt medicare questionnaire form. The patients weight, height, BMI and visual acuity have been recorded in the chart   I have made referrals, counseling and provided education to the patient based review of the above and I have provided the pt with a written personalized care plan for preventive services.   Documentation of this information was scanned into the electronic record under the media tab.   Advance directives and end of life planning reviewed in detail with patient and documented in EMR. Patient given handout on advance care directives if  needed. HCPOA and living will updated if needed.  No falls in last 12 months.     [place] Office Visit from 12/20/2022 in [place] at [facility]  PHQ-2 Total Score 2      Diabetes:  Inadequate control  on glyburide  10 twice daily and Trulicity [id number] mg  2 doses for 1.5 mg weekly.  She has been having trouble finding the Trulicity 1.5.Marland Kitchen was off for 2-3 weeks.. has only been on 0.75 mg Stopped metformin 10/26/2021 given worries about this meds.. has been on for years.  She is eating a very low carb diet. Lab Results  Component Value Date   HGBA1C 7.4 (H) 12/13/2022  Using medications without difficulties: Hypoglycemic episodes: none Hyperglycemic episodes: occ Feet problems: none Blood Sugars averaging: FBS  eye exam within last year:  Wt Readings from Last 3 Encounters:  12/20/22 154 lb 4 oz (70 kg)  12/17/22 155 lb (70.3 kg)  11/14/22 151 lb 0.2 oz (68.5 kg)    Hypertension:  good control in office on amlodipine and losartan. BP Readings from Last 3 Encounters:  12/20/22 130/60  11/14/22 (!) 143/58  10/20/22 (!) 152/72  Using medication without problems or lightheadedness:  none Chest pain with exertion: none  Edema: none Short of breath: none Average home BPs: Other issues:   Elevated Cholesterol: LDL at goal on  20 mg daily . Lab Results  Component Value Date   CHOL 169 12/13/2022   HDL 31.90 (L) 12/13/2022   LDLCALC 61 10/28/2017   LDLDIRECT 95.0 12/13/2022   TRIG 376.0 (H) 12/13/2022   CHOLHDL 5 12/13/2022  Using medications without problems: none Muscle aches:  none Diet compliance: she is eating healthy, low carbs. Exercise: walking 2-3 times a week. Other complaints:   Hypothyroid:  stable control on levo 88 mcg daily Lab Results  Component Value Date   TSH 2.37 12/08/2021    Breast cancer: active treatment on letrozole. Oncology note from 08/2022 reviewed. Dr. Lindi Adie  MDD, moderate control: Chronic insomnia: Hs SE to  trazodone in past.  She is not interested in medication  to treat.  Relevant past medical, surgical, family and social history reviewed and updated as indicated. Interim medical history since our last visit reviewed. Allergies and medications reviewed and updated. Outpatient Medications Prior to Visit  Medication Sig Dispense Refill   Alcohol Swabs (B-D SINGLE USE SWABS REGULAR) PADS Use to check blood sugar up to 2 times a day 100 each 5   amLODipine (NORVASC) 5 MG tablet TAKE 1 TABLET (5 MG TOTAL) BY MOUTH DAILY. 90 tablet 0   APPLE CIDER VINEGAR PO Take by mouth.     Blood Glucose Monitoring Suppl (ONETOUCH VERIO REFLECT) w/Device KIT Use to check blood sugar up to 2 times a day 1 kit 0   cetirizine (ZYRTEC) 10 MG tablet Take 10 mg by mouth at bedtime as needed for allergies.     CINNAMON PO Take 1 tablet by mouth daily.     Continuous Blood Gluc Sensor (FREESTYLE LIBRE 2 SENSOR) MISC APPLY SENSOR EVERY 14 DAYS TO MONITOR SUGAR CONTINOUSLY 2 each 5   Dulaglutide (TRULICITY) A999333 0000000 SOPN Inject 0.75 mg into the skin once a week. 6 mL 3   gabapentin (NEURONTIN) 100 MG capsule Take 100 mg by mouth 3 (three) times daily as needed.     glipiZIDE (GLUCOTROL) 5 MG tablet TAKE 2 TABLETS (10 MG TOTAL) BY MOUTH 2 (TWO) TIMES DAILY BEFORE A MEAL. 360 tablet 0   glucose blood (ONETOUCH VERIO) test strip Use to check blood sugar up to 2 times a day 100 each 5   Lancets (ONETOUCH DELICA PLUS Q000111Q) MISC Use to check blood sugar up to 2 times a day 100 each 5   letrozole (FEMARA) 2.5 MG tablet TAKE 1 TABLET BY MOUTH EVERY DAY 90 tablet 3   levothyroxine (SYNTHROID) 88 MCG tablet TAKE 1 TABLET BY MOUTH EVERY DAY 90 tablet 0   losartan (COZAAR) 100 MG tablet TAKE 1 TABLET BY MOUTH EVERY DAY 90 tablet 0   NON FORMULARY Ginsing root     Omega-3 Fatty Acids (FISH OIL OMEGA-3 PO) Take 1 capsule by mouth daily.     OVER THE COUNTER MEDICATION Liver gallbladder support tablets     OVER THE COUNTER  MEDICATION Leg and back pain relief tablets     PREBIOTIC PRODUCT PO Take by mouth.     rosuvastatin (CRESTOR) 20 MG tablet TAKE 1 TABLET BY MOUTH EVERY DAY 90 tablet 3   tiZANidine (ZANAFLEX) 4 MG tablet TAKE 1 TABLET BY MOUTH AT BEDTIME AS NEEDED FOR MUSCLE SPASMS. 90 tablet 0   traMADol (ULTRAM) 50 MG tablet Take 1 tablet (50 mg total) by mouth daily as needed. [place]  tablet 0   Turmeric (QC TUMERIC COMPLEX PO) Take 1 tablet by mouth daily.     vitamin B-12 (CYANOCOBALAMIN) 1000 MCG tablet Take 1,000 mcg by mouth daily.     VITAMIN D PO Take 2 each by mouth daily.     No facility-administered medications prior to visit.     Per HPI unless specifically indicated in ROS section below Review of Systems  Constitutional:  Negative for fatigue and fever.  HENT:  Negative for congestion.   Eyes:  Negative for pain.  Respiratory:  Negative for cough and shortness of breath.   Cardiovascular:  Negative for chest pain, palpitations and leg swelling.       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  Genitourinary:  Negative for dysuria and vaginal bleeding.  Musculoskeletal:  Negative for back pain.  Neurological:  Negative for syncope, light-headedness and headaches.  Psychiatric/Behavioral:  Negative for dysphoric mood.    Objective:  BP 130/60   Pulse 69   Temp 99 F (37.2 C) (Oral)   Ht '5\' 2"'$  (1.575 m)   Wt 154 lb 4 oz (70 kg)   SpO2 97%   BMI 28.21 kg/m   Wt Readings from Last 3 Encounters:  12/20/22 154 lb 4 oz (70 kg)  12/17/22 155 lb (70.3 kg)  11/14/22 151 lb 0.2 oz (68.5 kg)      Physical Exam Constitutional:      General: She is not in acute distress.    Appearance: Normal appearance. She is well-developed. She is not ill-appearing or toxic-appearing.  HENT:     Head: Normocephalic.     Right Ear: Hearing, tympanic membrane, ear canal and external ear normal. Tympanic membrane is not erythematous, retracted or bulging.      Left Ear: Hearing, tympanic membrane, ear canal and external ear normal. Tympanic membrane is not erythematous, retracted or bulging.     Nose: No mucosal edema or rhinorrhea.     Right Sinus: No maxillary sinus tenderness or frontal sinus tenderness.     Left Sinus: No maxillary sinus tenderness or frontal sinus tenderness.     Mouth/Throat:     Pharynx: Uvula midline.  Eyes:     General: Lids are normal. Lids are everted, no foreign bodies appreciated.     Conjunctiva/sclera: Conjunctivae normal.     Pupils: Pupils are equal, round, and reactive to light.  Neck:     Thyroid: No thyroid mass or thyromegaly.     Vascular: No carotid bruit.     Trachea: Trachea normal.  Cardiovascular:     Rate and Rhythm: Normal rate and regular rhythm.     Pulses: Normal pulses.     Heart sounds: Normal heart sounds, S1 normal and S2 normal. No murmur heard.    No friction rub. No gallop.  Pulmonary:     Effort: Pulmonary effort is normal. No tachypnea or respiratory distress.     Breath sounds: Normal breath sounds. No decreased breath sounds, wheezing, rhonchi or rales.  Abdominal:     General: Bowel sounds are normal.     Palpations: Abdomen is soft.     Tenderness: There is no abdominal tenderness.  Musculoskeletal:     Cervical back: Normal range of motion and neck supple.  Skin:    General: Skin is warm and dry.     Findings: No [place].  Neurological:     Mental Status: She is alert.  Psychiatric:  Mood and Affect: Mood is not anxious or depressed.        Speech: Speech normal.        Behavior: Behavior normal. Behavior is cooperative.        Thought Content: Thought content normal.        Judgment: Judgment normal.       Results for orders placed or performed in visit on 12/13/22  Microalbumin / creatinine urine ratio  Result Value Ref Range   Microalb, Ur 27.0 (H) 0.0 - 1.9 mg/dL   Creatinine,U 45.1 mg/dL   Microalb Creat Ratio 59.9 (H) 0.0 - 30.0 mg/g  Comprehensive  metabolic panel  Result Value Ref Range   Sodium 141 135 - 145 mEq/L   Potassium 4.2 3.5 - 5.1 mEq/L   Chloride 106 96 - 112 mEq/L   CO2 26 19 - 32 mEq/L   Glucose, Bld 194 (H) 70 - 99 mg/dL   BUN 30 (H) 6 - 23 mg/dL   Creatinine, Ser 1.23 (H) 0.40 - 1.20 mg/dL   Total Bilirubin 0.5 0.2 - 1.2 mg/dL   Alkaline Phosphatase 45 39 - 117 U/L   AST 21 0 - 37 U/L   ALT 29 0 - 35 U/L   Total Protein 6.8 6.0 - 8.3 g/dL   Albumin 4.4 3.5 - 5.2 g/dL   GFR 43.51 (L) >60.00 mL/min   Calcium 9.7 8.4 - 10.5 mg/dL  Lipid panel  Result Value Ref Range   Cholesterol 169 0 - 200 mg/dL   Triglycerides 376.0 (H) 0.0 - 149.0 mg/dL   HDL 31.90 (L) >39.00 mg/dL   VLDL 75.2 (H) 0.0 - 40.0 mg/dL   Total CHOL/HDL Ratio 5    NonHDL 137.33   Hemoglobin A1c  Result Value Ref Range   Hgb A1c MFr Bld 7.4 (H) 4.6 - 6.5 %  LDL cholesterol, direct  Result Value Ref Range   Direct LDL 95.0 mg/dL    This visit occurred during the SARS-CoV-2 public health emergency.  Safety protocols were in place, including screening questions prior to the visit, additional usage of staff PPE, and extensive cleaning of exam room while observing appropriate contact time as indicated for disinfecting solutions.   COVID 19 screen:  No recent travel or known exposure to COVID19 The patient denies respiratory symptoms of COVID 19 at this time. The importance of social distancing was discussed today.   Assessment and Plan   The patient's preventative maintenance and recommended screening tests for an annual wellness exam were reviewed in full today. Brought up to date unless services declined.  Counselled on the importance of diet, exercise, and its role in overall health and mortality. The patient's FH and SH was reviewed, including their home life, tobacco status, and drug and alcohol status.   Uptodate with PNA and flu vaccine, COVID x2 Consider shingrix.  Former smoker: Advertising account executive on cessation. S/P hysterectomy.   Mammo: 05/2022   05/2019 DEXA..  Improved on fosamax. stable 04/2021 stopped fosamax .. recheck in  summer 2024   Problem List Items Addressed This Visit     Carcinoma of upper-inner quadrant of right breast in female, estrogen receptor positive ([place])     Active treatment  Followed by Onc, Dr. Lindi Adie, most recent OV note reviewed.  Treatment plan: Adjuvant antiestrogen therapy with letrozole 2.5 mg daily x7 years started 11/28/2021         CKD (chronic kidney disease) stage 3, GFR 30-59 ml/min (HCC)    Chronic, improving control  Associated with diabetes.      Hyperlipidemia associated with type 2 diabetes mellitus (HCC)    Chronic, LDL at goal less than 100 on Crestor 20 mg p.o. daily      Hypertension associated with diabetes ([place])    Stable, chronic.  Continue current medication.  Continue amlodipine 5 mg p.o. daily and losartan 100 mg p.o. daily      Hypothyroidism    Stable, chronic.  Continue current medication.   Levo 88 mcg dialy      Moderate major depression, single episode (HCC)    Chronic, moderate control   Not interested in counseling or medication to treat.      Osteoporosis   Relevant Orders   DG Bone Density   Type 2 diabetes mellitus with nephropathy ([place])    Worsened control but primarily because she cannot find 1.5 mg Trulicity.Marland Kitchen on order now and will restart higher dose when available. Follow up  in 3 month.   Of note microalbuminuria... consider SGLt2 instead of glipizide for protection of kidneys at next OV.   Trulicity [id number] mg weekly  Glipizide 10 mg BID      Other Visit Diagnoses     Routine general medical examination at a health care facility    -  Primary   Acute midline low back pain without sciatica          Eliezer Lofts, MD

## 2022-12-20 NOTE — Patient Instructions (Addendum)
Once Trulicity 1.5 mg weekly available restart at this dose.  Continue working on low carb diet and regular exercise.  Start home PT, heat on low back and massage if able.  Can use muscle relaxant as needed at night.  Please call the location of your choice from the menu below to schedule your Mammogram and/or Bone Density appointment.    [facility]                      Phone:  [phone], [place] [address]                                                             Services: Traditional and 3D Mammogram, [name] [facility] Density                 Phone: [phone] [address], [place] [address]    Service: Bone Density ONLY   *this site does NOT perform mammograms  [place]                        Phone:  [phone] [address], [place] [address]                                            Services:  3D Mammogram and [place] at [facility]   Phone:  [phone]   [place],  [address]                                            Services: 3D Mammogram and [place]  [place] at [facility])  Phone:  [phone]   [address]  [place], [place] [address]                                              Services:  3D Mammogram and Bone Density

## 2022-12-20 NOTE — Assessment & Plan Note (Signed)
Stable, chronic.  Continue current medication.  Continue amlodipine 5 mg p.o. daily and losartan 100 mg p.o. daily 

## 2022-12-20 NOTE — Assessment & Plan Note (Signed)
Chronic, LDL at goal less than 100 on Crestor 20 mg p.o. daily 

## 2022-12-20 NOTE — Assessment & Plan Note (Signed)
Chronic, moderate control ? ? Not interested in counseling or medication to treat. ?

## 2022-12-20 NOTE — Assessment & Plan Note (Signed)
Stable, chronic.  Continue current medication. ? ? ?Levo 88 mcg dialy ?

## 2022-12-20 NOTE — Assessment & Plan Note (Signed)
Active treatment  Followed by Onc, Dr. Lindi Adie, most recent OV note reviewed.  Treatment plan: Adjuvant antiestrogen therapy with letrozole 2.5 mg daily x7 years started 11/28/2021

## 2022-12-20 NOTE — Assessment & Plan Note (Addendum)
Worsened control but primarily because she cannot find 1.5 mg Trulicity.Marland Kitchen on order now and will restart higher dose when available. Follow up  in 3 month.   Of note microalbuminuria... consider SGLt2 instead of glipizide for protection of kidneys at next OV.   Trulicity [id number] mg weekly  Glipizide 10 mg BID

## 2022-12-24 DIAGNOSIS — H40013 Open angle with borderline findings, low risk, bilateral: Secondary | ICD-10-CM | POA: Diagnosis not present

## 2022-12-24 DIAGNOSIS — E119 Type 2 diabetes mellitus without complications: Secondary | ICD-10-CM | POA: Diagnosis not present

## 2022-12-24 DIAGNOSIS — H35033 Hypertensive retinopathy, bilateral: Secondary | ICD-10-CM | POA: Diagnosis not present

## 2022-12-24 DIAGNOSIS — H353131 Nonexudative age-related macular degeneration, bilateral, early dry stage: Secondary | ICD-10-CM | POA: Diagnosis not present

## 2022-12-24 DIAGNOSIS — H52223 Regular astigmatism, bilateral: Secondary | ICD-10-CM | POA: Diagnosis not present

## 2022-12-24 LAB — HM DIABETES EYE EXAM

## 2022-12-25 ENCOUNTER — Other Ambulatory Visit: Payer: Self-pay | Admitting: Family Medicine

## 2022-12-25 DIAGNOSIS — Z1231 Encounter for screening mammogram for malignant neoplasm of breast: Secondary | ICD-10-CM

## 2022-12-25 DIAGNOSIS — Z9889 Other specified postprocedural states: Secondary | ICD-10-CM

## 2023-01-09 ENCOUNTER — Telehealth: Payer: Self-pay

## 2023-01-09 NOTE — Progress Notes (Signed)
Care Management & Coordination Services Pharmacy Team  Reason for Encounter: Appointment Reminder  Contacted patient to confirm telephone appointment with Charlene Brooke , PharmD on 01/14/23 at 3:00. Unsuccessful outreach. Left voicemail for patient to return call.  Have you seen any other providers since your last visit with PCP? No  Hospital visits:  None in previous 6 months   Star Rating Drugs:  Medication:  Last Fill: Day Supply Trulicity  [id number] Glipizide 5mg   10/30/22 90 Losartan 100mg  10/29/22 90 Rosuvastatin 20mg   12/11/22  90  [facility]   Care Gaps: Annual wellness visit in last year? Yes  If Diabetic: Last eye exam / retinopathy screening:UTD Last diabetic foot exam:UTD   Charlene Brooke, PharmD notified  Avel Sensor, [place] Assistant [phone]

## 2023-01-14 ENCOUNTER — Ambulatory Visit: Payer: PPO | Admitting: Pharmacist

## 2023-01-14 DIAGNOSIS — E1121 Type 2 diabetes mellitus with diabetic nephropathy: Secondary | ICD-10-CM

## 2023-01-14 MED ORDER — OZEMPIC (0.25 OR 0.5 MG/DOSE) 2 MG/3ML ~~LOC~~ SOPN: 0.5000 mg | PEN_INJECTOR | SUBCUTANEOUS | 3 refills | Status: DC

## 2023-01-14 NOTE — Progress Notes (Signed)
Care Management & Coordination Services Pharmacy Note  01/14/2023 Name:  April Ross MRN:  [id number] DOB:  12-28-1950  Summary: F/U visit -DM: A1c 7.4% (12/2022), increased from 6.7% previously; pt has not been able to get Trulicity 1.5 mg due to national supply issues, she continues on 0.75 mg weekly; BG control has deteriorated further per most recent CGM report: -Reviewed AGP report: 01/01/23 to 01/14/23. Sensor active: 87%  Time in range (70-180): 44% (goal > 70%)  High (180-250): 46%  Very high (>250): 10%  Low (< 70): 0% (goal < 4%)  GMI: 7.9%; Average glucose: 193  Recommendations/Changes made from today's visit: -Switch Trulicity to Ozempic 0.5 mg weekly; pt has ~7 Trulicity pens (0.75 mg) remaining, advised to use 2 per week then switch to Ozempic  Follow up plan: -Pharmacist follow up televisit scheduled for 6 weeks -PCP app 03/26/23; DEXA/MMG 06/27/23; Oncology appt 08/12/23    Subjective: April Ross is an 72 y.o. year old female who is a primary patient of Bedsole, Amy E, MD.  The care coordination team was consulted for assistance with disease management and care coordination needs.    Engaged with patient by telephone for follow up visit.  Recent office visits: 12/20/22 Dr Ermalene Searing OV: f/u- A1c 7.4%; unable to locate Trulicity 1.5 (supply issue), stays on 0.75 mg  06/21/22 Dr Ermalene Searing OV: f/u - DM foot exam done. Referred for lung cancer screening. Encouraged smoking cessation.  Recent consult visits: 11/14/22 Admission - carpal tunnel release  10/19/21 Dr Frazier Butt ([facility]): carpal tunnel  08/09/22 Dr Pamelia Hoit (Oncology): breast cancer - continue letrozole x 7 years (started 11/2021). Daytime fatigue - advised reduce gabapentin to 1 per day.  Hospital visits: 10/20/22 ED visit (AP): hyperglycemia, chest pain. Glucose 411. Improved with Aspart 6 units.   Objective:  Lab Results  Component Value Date   CREATININE 1.23 (H) 12/13/2022   BUN 30 (H) 12/13/2022    GFR 43.51 (L) 12/13/2022   GFRNONAA 43 (L) 10/20/2022   GFRAA 37 (L) 04/12/2020   NA 141 12/13/2022   K 4.2 12/13/2022   CALCIUM 9.7 12/13/2022   CO2 26 12/13/2022   GLUCOSE 194 (H) 12/13/2022    Lab Results  Component Value Date/Time   HGBA1C 7.4 (H) 12/13/2022 07:28 AM   HGBA1C 6.7 (H) 06/14/2022 07:29 AM   GFR 43.51 (L) 12/13/2022 07:28 AM   GFR 41.23 (L) 06/14/2022 07:29 AM   MICROALBUR 27.0 (H) 12/13/2022 07:28 AM   MICROALBUR 5.0 (H) 11/19/2017 10:18 AM    Last diabetic Eye exam:  Lab Results  Component Value Date/Time   HMDIABEYEEXA No Retinopathy 12/24/2022 01:21 PM    Last diabetic Foot exam:  Lab Results  Component Value Date/Time   HMDIABFOOTEX done 06/11/2022 12:00 AM     Lab Results  Component Value Date   CHOL 169 12/13/2022   HDL 31.90 (L) 12/13/2022   LDLCALC 61 10/28/2017   LDLDIRECT 95.0 12/13/2022   TRIG 376.0 (H) 12/13/2022   CHOLHDL 5 12/13/2022       Latest Ref Rng & Units 12/13/2022    7:28 AM 06/14/2022    7:29 AM 12/08/2021    7:30 AM  Hepatic Function  Total Protein 6.0 - 8.3 g/dL 6.8  7.2  6.5   Albumin 3.5 - 5.2 g/dL 4.4  4.5  4.3   AST 0 - 37 U/L ALT 0 - 35 U/L Alk  Phosphatase 39 - 117 U/L 45  48  65   Total Bilirubin 0.2 - 1.2 mg/dL 0.5  0.5  0.5     Lab Results  Component Value Date/Time   TSH 2.37 12/08/2021 07:30 AM   TSH 2.89 11/18/2020 08:13 AM   FREET4 1.15 12/08/2021 07:30 AM   FREET4 1.22 11/18/2020 08:13 AM       Latest Ref Rng & Units 10/20/2022    7:07 AM 03/16/2022    9:27 AM 12/01/2020    6:35 PM  CBC  WBC 4.0 - 10.5 K/uL 15.0  6.2  8.5   Hemoglobin 12.0 - 15.0 g/dL 16.1  09.6  04.5   Hematocrit 36.0 - 46.0 % 40.6  40.7  38.2   Platelets 150 - 400 K/uL 224  197.0  240     Lab Results  Component Value Date/Time   VD25OH 70.19 03/16/2022 09:27 AM   VD25OH 49.99 02/18/2018 08:22 AM   VITAMINB12 557 03/16/2022 09:27 AM    Clinical ASCVD: No  The 10-year ASCVD risk score (Arnett DK,  et al., 2019) is: 44.2%   Values used to calculate the score:     Age: 37 years     Sex: Female     Is Non-Hispanic African American: No     Diabetic: Yes     Tobacco smoker: Yes     Systolic Blood Pressure: 130 mmHg     Is BP treated: Yes     HDL Cholesterol: 31.9 mg/dL     Total Cholesterol: 169 mg/dL        01/14/8118    1:47 AM 12/17/2022    9:37 AM 12/15/2021    9:10 AM  Depression screen PHQ 2/9  Decreased Interest 1 0 2  Down, Depressed, Hopeless 1 1 2   PHQ - 2 Score 2 1 4   Altered sleeping 3  3  Tired, decreased energy 3  2  Change in appetite 0  0  Feeling bad or failure about yourself  0  0  Trouble concentrating 0  0  Moving slowly or fidgety/restless 0  0  Suicidal thoughts 0  0  PHQ-9 Score 8  9  Difficult doing work/chores Not difficult at all       Social History   Tobacco Use  Smoking Status Former   Packs/day: 1.00   Years: 15.00   Additional pack years: 0.00   Total pack years: 15.00   Types: Cigarettes  Smokeless Tobacco Never   BP Readings from Last 3 Encounters:  12/20/22 130/60  11/14/22 (!) 143/58  10/20/22 (!) 152/72   Pulse Readings from Last 3 Encounters:  12/20/22 69  11/14/22 65  10/20/22 75   Wt Readings from Last 3 Encounters:  12/20/22 154 lb 4 oz (70 kg)  12/17/22 155 lb (70.3 kg)  11/14/22 151 lb 0.2 oz (68.5 kg)   BMI Readings from Last 3 Encounters:  12/20/22 28.21 kg/m  12/17/22 28.35 kg/m  11/14/22 27.62 kg/m    Allergies  Allergen Reactions   Acetaminophen     Debnam    Asa [Aspirin]     Pitt groin and axilla area   Ibuprofen    Other [place]    Mycolog-II & HRT Hormone patch    Medications Reviewed Today     Reviewed by Kathyrn Sheriff, RPH (Pharmacist) on 01/14/23 at 1440  Med List Status: <None>   Medication Order Taking? Sig Documenting Provider Last Dose Status Informant  Alcohol Swabs (B-D SINGLE  USE SWABS REGULAR) PADS [id number] Yes Use to check blood sugar up to 2 times a day Bedsole, Amy  E, MD Taking Active   amLODipine (NORVASC) 5 MG tablet [id number] Yes TAKE 1 TABLET (5 MG TOTAL) BY MOUTH DAILY. Excell Seltzer, MD Taking Active   APPLE CIDER VINEGAR PO [id number] Yes Take by mouth. [provider] Taking Active Self  Blood Glucose Monitoring Suppl (ONETOUCH VERIO REFLECT) w/Device KIT [id number] Yes Use to check blood sugar up to 2 times a day Bedsole, Amy E, MD Taking Active   cetirizine (ZYRTEC) 10 MG tablet [id number] Yes Take 10 mg by mouth at bedtime as needed for allergies. [provider] Taking Active Self  CINNAMON PO [id number] Yes Take 1 tablet by mouth daily. [provider] Taking Active Self  Continuous Blood Gluc Sensor (FREESTYLE LIBRE 2 SENSOR) MISC [id number] Yes APPLY SENSOR EVERY 14 DAYS TO MONITOR SUGAR CONTINOUSLY Bedsole, Amy E, MD Taking Active   Dulaglutide (TRULICITY) 0.75 MG/0.5ML SOPN [id number] Yes Inject 0.75 mg into the skin once a week. Excell Seltzer, MD Taking Active   gabapentin (NEURONTIN) 100 MG capsule [id number] Yes Take 100 mg by mouth 3 (three) times daily as needed. [provider] Taking Active   glipiZIDE (GLUCOTROL) 5 MG tablet [id number] Yes TAKE 2 TABLETS (10 MG TOTAL) BY MOUTH 2 (TWO) TIMES DAILY BEFORE A MEAL. Excell Seltzer, MD Taking Active   glucose blood (ONETOUCH VERIO) test strip [id number] Yes Use to check blood sugar up to 2 times a day Excell Seltzer, MD Taking Active   Lancets [facility] DELICA PLUS [place]) MISC [id number] Yes Use to check blood sugar up to 2 times a day Excell Seltzer, MD Taking Active   letrozole [facility]) 2.5 MG tablet [id number] Yes TAKE 1 TABLET BY MOUTH EVERY DAY [name], MD Taking Active   levothyroxine (SYNTHROID) 88 MCG tablet [id number] Yes TAKE 1 TABLET BY MOUTH EVERY DAY Bedsole, Amy E, MD Taking Active   losartan (COZAAR) 100 MG tablet [id number] Yes TAKE 1 TABLET BY MOUTH EVERY DAY Excell Seltzer, MD Taking Active   NON Vickki Hearing [id number] Yes Ginsing root  [provider] Taking Active Self  Omega-3 Fatty Acids (FISH OIL OMEGA-3 PO) [id number] Yes Take 1 capsule by mouth daily. [provider] Taking Active Self  OVER THE COUNTER MEDICATION [id number] Yes Liver gallbladder support tablets [provider] Taking Active Self  OVER THE COUNTER MEDICATION [id number] Yes Leg and back pain relief tablets [provider] Taking Active Self  PREBIOTIC PRODUCT PO [id number] Yes Take by mouth. [provider] Taking Active Self  rosuvastatin (CRESTOR) 20 MG tablet [id number] Yes TAKE 1 TABLET BY MOUTH EVERY DAY Bedsole, Amy E, MD Taking Active   tiZANidine (ZANAFLEX) 4 MG tablet [id number] Yes TAKE 1 TABLET BY MOUTH AT BEDTIME AS NEEDED FOR MUSCLE SPASMS. Excell Seltzer, MD Taking Active   traMADol (ULTRAM) 50 MG tablet [id number] Yes Take 1 tablet (50 mg total) by mouth daily as needed. Excell Seltzer, MD Taking Active   Turmeric (QC TUMERIC COMPLEX PO) [id number] Yes Take 1 tablet by mouth daily. [provider] Taking Active Self  vitamin B-12 (CYANOCOBALAMIN) 1000 MCG tablet [id number] Yes Take 1,000 mcg by mouth daily. [provider] Taking Active   VITAMIN D PO [id number] Yes Take 2 each by mouth daily. [provider] Taking Active             SDOH:  (Social Determinants of  Health) assessments and interventions performed: No SDOH Interventions    Flowsheet Row [facility] Clinical Support from 12/17/2022 [facility] Chronic Care Management [facility] Social Work from [facility] Clinical [facility]  SDOH Interventions        Food  Insecurity Interventions -- Intervention Not Indicated Intervention Not Indicated Intervention Not Indicated -- --  Housing Interventions -- Intervention Not Indicated -- Intervention Not Indicated -- --  Transportation Interventions -- Intervention Not Indicated -- Intervention Not Indicated -- --  Utilities Interventions -- Intervention Not Indicated -- -- -- --  Alcohol Usage Interventions -- Intervention Not Indicated (Score <7) -- -- -- --  Depression Interventions/Treatment  Counseling -- -- -- PHQ2-9 Score <4 Follow-up Not Indicated --  [referral to PCP]  Financial Strain Interventions -- Intervention Not Indicated Intervention Not Indicated Intervention Not Indicated -- --  Physical Activity Interventions -- Patient Refused, Other (Comments) -- -- -- --  Stress Interventions -- Patient Refused, Intervention Not Indicated -- -- -- --  Social Connections Interventions -- Intervention Not Indicated -- -- -- --       Medication Assistance: None required.  Patient affirms current coverage meets needs.  Medication Access: Within the past 30 days, how often has patient missed a dose of medication? 0 Is a pillbox or other method used to improve adherence? Yes  Factors that may affect medication adherence? no barriers identified Are meds synced by current pharmacy? No  Are meds delivered by current pharmacy? No  Does patient experience delays in picking up medications due to transportation concerns? No   [facility] Services Reviewed: Is patient disadvantaged to use [facility]?: Yes  Current Rx insurance plan: HTA DM/heart Name and location of Current pharmacy:  [id number] [place], [place] - [address] [facility] [address] [place] [id number] Phone: [phone] Fax: [phone]  [facility] services reviewed with patient today?: No  Patient requests to transfer care to [facility] Pharm[facility]?: No  Reason patient declined to change pharmacies:  Disadvantaged due to insurance/mail order  Compliance/Adherence/Medication fill history: Care Gaps: UACR (due 11/2018)  Star-Rating Drugs: Trulicity - PDC 100% Glipizide - PDC 100% Losartan - PDC 100% Rosuvastatin - PDC 100%   ASSESSMENT / PLAN  Hypertension (BP goal <140/90) -Not ideally controlled - BP has been elevated in recent OV's (> 140/90), but pt was being seen for acute issues so BP may have only been temporarily elevated; she is not checking BP at home -Feb 2024:              146/73             142/71             151/80             138/74             132/71 -Current treatment: Amlodipine 5 mg daily - Appropriate, Effective, Safe, Accessible Losartan 100 mg daily - Appropriate, Effective, Safe, Accessible -Medications previously tried: n/a  -Educated on BP goals and benefits of medications for prevention of heart attack, stroke and kidney damage; -Counseled to monitor BP at home several times weekly; f/u 2-4 weeks -  Recommended to continue current medication  Diabetes (A1c goal <7%) -Not ideally controlled - A1c 7.4% (12/2022) increased from 6.7% previosly; -reviewed ED visit 10/20/22 for hyperglycemia, BG was > 400. Pt thought this was due to 2 handfuls of chocolate peanuts. Per chart review, pt did have a steroid injection in her wrist for carpal tunnel the day before the admission. Discussed this was likely the cause of the sustained hyperglycemia, not the peanuts. -Current home glucose readings:  Reviewed AGP report: 01/01/23 to 01/14/23. Sensor active: 87%  Time in range (70-180): 44% (goal > 70%)  High (180-250): 46%  Very high (>250): 10%  Low (< 70): 0% (goal < 4%)  GMI: 7.9%; Average glucose: 193  Previous AGP report: 11/03/22 to 11/16/22. Sensor active: 91%  Time in range (70-180): 58% (goal > 70%)  High (180-250): 37%  Very high (>250): 5%  Low (< 70): 0% (goal < 4%)  GMI: 7.5%; Average glucose: 176  -Diet: pt lives with her daughter who prepares most meals  and is not consistently following diabetic diet -Current medications: Glipizide 5 mg - 2 tab BID - Appropriate, Effective, Query Safe Trulicity 0.75 mg weekly - Appropriate, Effective, Safe, Accessible Freestyle Libre 2 - Appropriate, Effective, Safe, Accessible -Medications previously tried: metformin (SE) -Educated on A1c and blood sugar goals; Reviewed CGM goals (TIR > 70%);  -Discussed effect of steroid (injections and oral) on glucose control; advised that glucose control will need to be closely monitored following future steroid injections -Reviewed CGM report, it does demonstrate somewhat worse control compared to most recent A1c and previous CGM reports; pt may benefit from switching to Ozempic -Recommend to stop Trulicity; start Ozempic 0.5 mg weekly  Hyperlipidemia: (LDL goal < 100) -Controlled - LDL 95 (12/2022) at goal; TRIG 376 elevated; pt endorses compliance with rosuvastatin, she had bought it outside of insurance once so has extra supply -Current treatment: Rosuvastatin 20 mg daily - Appropriate, Effective, Safe, Accessible -Medications previously tried: n/a  -Educated on Cholesterol goals; Benefits of statin for ASCVD risk reduction; -Recommended to continue current medication  Chronic Kidney Disease Stage 3b  -All medications assessed for renal dosing and appropriateness in chronic kidney disease. -UACR 54 (12/2022); consider SGLT2 -Recommended to continue current medication  Breast cancer  -Dx 05/2021, s/p lumpectomy 07/2021, radiation 09/2021-11/2021; follows with oncology (Dr Pamelia Hoit), planning letrozole for 7 years -pt c/o of significant fatigue since completing radiation; recently moved letrozole to bedtime per advice from oncology, she has not noticed much difference yet -Current treatment  Letrozole 2.5 mg daily HS - Appropriate, Effective, Safe, Accessible Gabapentin 100 mg TID prn (surgical pain) -Appropriate, Effective, Safe, Accessible Tizanidine 4 mg HS prn  -Appropriate, Effective, Safe, Accessible -Medications previously tried: n/a  -Recommended to continue current medication  Fatigue -Improving - pt reports significant fatigue since completing radiation; she has had labwork per PCP that was WNL; sleep study was considered but pt declined; -Pt did start using Zquil ZZZ's supplement (melatonin, chamomile, lavender)  Health Maintenance -Vaccine gaps: Shingrix, covid booster -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

## 2023-01-14 NOTE — Patient Instructions (Signed)
Visit Information  Phone number for Pharmacist: [phone]  Thank you for meeting with me to discuss your medications! Below is a summary of what we talked about during the visit:   Recommendations/Changes made from today's visit: -Switch Trulicity to Ozempic 0.5 mg weekly; pt has ~7 Trulicity pens (0.75 mg) remaining, advised to use 2 per week then switch to Ozempic  Follow up plan: -Health Concierge will call patient 2 weeks for BP update -Pharmacist follow up televisit scheduled for 2 months -PCP app 03/26/23; DEXA/MMG 06/27/23; Oncology appt 08/12/23   Al Corpus, PharmD, BCACP Clinical Pharmacist [facility] [phone]

## 2023-01-31 ENCOUNTER — Encounter: Payer: PPO | Admitting: Pharmacist

## 2023-01-31 ENCOUNTER — Telehealth: Payer: Self-pay | Admitting: Pharmacist

## 2023-01-31 NOTE — Telephone Encounter (Signed)
Patient contacted to discuss diabetes. She has not started Ozempic yet, she reports the pharmacy has not received authorization. I do not see a PA request in the chart. Discussed relative benefits of Ozempic vs Trulicity, pt cannot get Trulicity 1.5 mg dose right now due to national backorder but she needs additional therapy to reach DM goals. Advised switching to Ozempic is in her best interest.  Submitted PA for Ozempic 0.5 mg CMM Key: [id number]  Advised pt to double dose of Trulicity (0.75 mg x 2 injections at once) to finish out her supply, then start Ozempic 0.5 mg weekly when next dose is due.   Reviewed AGP report: 01/18/23 to 01/31/23. Sensor active: 91%  Time in range (70-180): 53% (goal > 70%)  High (180-250): 38%  Very high (>250): 9%  Low (< 70): 0% (goal < 4%)  GMI: 7.8%; Average glucose: 186

## 2023-01-31 NOTE — Progress Notes (Unsigned)
Care Management & Coordination Services Pharmacy Note  01/31/2023 Name:  April Ross MRN:  [id number] DOB:  12-12-1950  Summary: F/U visit -DM: A1c 7.4% (12/2022), increased from 6.7% previously; pt has not been able to get Trulicity 1.5 mg due to national supply issues, she continues on 0.75 mg weekly; BG control has deteriorated further per most recent CGM report: -Reviewed AGP report: 01/01/23 to 01/14/23. Sensor active: 87%  Time in range (70-180): 44% (goal > 70%)  High (180-250): 46%  Very high (>250): 10%  Low (< 70): 0% (goal < 4%)  GMI: 7.9%; Average glucose: 193  Recommendations/Changes made from today's visit: -Switch Trulicity to Ozempic 0.5 mg weekly; pt has ~7 Trulicity pens (0.75 mg) remaining, advised to use 2 per week then switch to Ozempic  Follow up plan: -Pharmacist follow up televisit scheduled for 6 weeks -PCP app 03/26/23; DEXA/MMG 06/27/23; Oncology appt 08/12/23    Subjective: April Ross is an 72 y.o. year old female who is a primary patient of Bedsole, Amy E, MD.  The care coordination team was consulted for assistance with disease management and care coordination needs.    Engaged with patient by telephone for follow up visit.  Recent office visits: 12/20/22 Dr Ermalene Searing OV: f/u- A1c 7.4%; unable to locate Trulicity 1.5 (supply issue), stays on 0.75 mg  06/21/22 Dr Ermalene Searing OV: f/u - DM foot exam done. Referred for lung cancer screening. Encouraged smoking cessation.  Recent consult visits: 11/14/22 Admission - carpal tunnel release  10/19/21 Dr Frazier Butt ([facility]): carpal tunnel  08/09/22 Dr Pamelia Hoit (Oncology): breast cancer - continue letrozole x 7 years (started 11/2021). Daytime fatigue - advised reduce gabapentin to 1 per day.  Hospital visits: 10/20/22 ED visit (AP): hyperglycemia, chest pain. Glucose 411. Improved with Aspart 6 units.   Objective:  Lab Results  Component Value Date   CREATININE 1.23 (H) 12/13/2022   BUN 30 (H) 12/13/2022    GFR 43.51 (L) 12/13/2022   GFRNONAA 43 (L) 10/20/2022   GFRAA 37 (L) 04/12/2020   NA 141 12/13/2022   K 4.2 12/13/2022   CALCIUM 9.7 12/13/2022   CO2 26 12/13/2022   GLUCOSE 194 (H) 12/13/2022    Lab Results  Component Value Date/Time   HGBA1C 7.4 (H) 12/13/2022 07:28 AM   HGBA1C 6.7 (H) 06/14/2022 07:29 AM   GFR 43.51 (L) 12/13/2022 07:28 AM   GFR 41.23 (L) 06/14/2022 07:29 AM   MICROALBUR 27.0 (H) 12/13/2022 07:28 AM   MICROALBUR 5.0 (H) 11/19/2017 10:18 AM    Last diabetic Eye exam:  Lab Results  Component Value Date/Time   HMDIABEYEEXA No Retinopathy 12/24/2022 01:21 PM    Last diabetic Foot exam:  Lab Results  Component Value Date/Time   HMDIABFOOTEX done 06/11/2022 12:00 AM     Lab Results  Component Value Date   CHOL 169 12/13/2022   HDL 31.90 (L) 12/13/2022   LDLCALC 61 10/28/2017   LDLDIRECT 95.0 12/13/2022   TRIG 376.0 (H) 12/13/2022   CHOLHDL 5 12/13/2022       Latest Ref Rng & Units 12/13/2022    7:28 AM 06/14/2022    7:29 AM 12/08/2021    7:30 AM  Hepatic Function  Total Protein 6.0 - 8.3 g/dL 6.8  7.2  6.5   Albumin 3.5 - 5.2 g/dL 4.4  4.5  4.3   AST 0 - 37 U/L ALT 0 - 35 U/L Alk  Phosphatase 39 - 117 U/L 45  48  65   Total Bilirubin 0.2 - 1.2 mg/dL 0.5  0.5  0.5     Lab Results  Component Value Date/Time   TSH 2.37 12/08/2021 07:30 AM   TSH 2.89 11/18/2020 08:13 AM   FREET4 1.15 12/08/2021 07:30 AM   FREET4 1.22 11/18/2020 08:13 AM       Latest Ref Rng & Units 10/20/2022    7:07 AM 03/16/2022    9:27 AM 12/01/2020    6:35 PM  CBC  WBC 4.0 - 10.5 K/uL 15.0  6.2  8.5   Hemoglobin 12.0 - 15.0 g/dL 16.1  09.6  04.5   Hematocrit 36.0 - 46.0 % 40.6  40.7  38.2   Platelets 150 - 400 K/uL 224  197.0  240     Lab Results  Component Value Date/Time   VD25OH 70.19 03/16/2022 09:27 AM   VD25OH 49.99 02/18/2018 08:22 AM   VITAMINB12 557 03/16/2022 09:27 AM    Clinical ASCVD: No  The 10-year ASCVD risk score (Arnett DK,  et al., 2019) is: 44.2%   Values used to calculate the score:     Age: 37 years     Sex: Female     Is Non-Hispanic African American: No     Diabetic: Yes     Tobacco smoker: Yes     Systolic Blood Pressure: 130 mmHg     Is BP treated: Yes     HDL Cholesterol: 31.9 mg/dL     Total Cholesterol: 169 mg/dL        01/14/8118    1:47 AM 12/17/2022    9:37 AM 12/15/2021    9:10 AM  Depression screen PHQ 2/9  Decreased Interest 1 0 2  Down, Depressed, Hopeless 1 1 2   PHQ - 2 Score 2 1 4   Altered sleeping 3  3  Tired, decreased energy 3  2  Change in appetite 0  0  Feeling bad or failure about yourself  0  0  Trouble concentrating 0  0  Moving slowly or fidgety/restless 0  0  Suicidal thoughts 0  0  PHQ-9 Score 8  9  Difficult doing work/chores Not difficult at all       Social History   Tobacco Use  Smoking Status Former   Packs/day: 1.00   Years: 15.00   Additional pack years: 0.00   Total pack years: 15.00   Types: Cigarettes  Smokeless Tobacco Never   BP Readings from Last 3 Encounters:  12/20/22 130/60  11/14/22 (!) 143/58  10/20/22 (!) 152/72   Pulse Readings from Last 3 Encounters:  12/20/22 69  11/14/22 65  10/20/22 75   Wt Readings from Last 3 Encounters:  12/20/22 154 lb 4 oz (70 kg)  12/17/22 155 lb (70.3 kg)  11/14/22 151 lb 0.2 oz (68.5 kg)   BMI Readings from Last 3 Encounters:  12/20/22 28.21 kg/m  12/17/22 28.35 kg/m  11/14/22 27.62 kg/m    Allergies  Allergen Reactions   Acetaminophen     Debnam    Asa [Aspirin]     Pitt groin and axilla area   Ibuprofen    Other [place]    Mycolog-II & HRT Hormone patch    Medications Reviewed Today     Reviewed by Kathyrn Sheriff, RPH (Pharmacist) on 01/14/23 at 1440  Med List Status: <None>   Medication Order Taking? Sig Documenting Provider Last Dose Status Informant  Alcohol Swabs (B-D SINGLE  USE SWABS REGULAR) PADS [id number] Yes Use to check blood sugar up to 2 times a day Bedsole, Amy  E, MD Taking Active   amLODipine (NORVASC) 5 MG tablet [id number] Yes TAKE 1 TABLET (5 MG TOTAL) BY MOUTH DAILY. Excell Seltzer, MD Taking Active   APPLE CIDER VINEGAR PO [id number] Yes Take by mouth. [provider] Taking Active Self  Blood Glucose Monitoring Suppl (ONETOUCH VERIO REFLECT) w/Device KIT [id number] Yes Use to check blood sugar up to 2 times a day Bedsole, Amy E, MD Taking Active   cetirizine (ZYRTEC) 10 MG tablet [id number] Yes Take 10 mg by mouth at bedtime as needed for allergies. [provider] Taking Active Self  CINNAMON PO [id number] Yes Take 1 tablet by mouth daily. [provider] Taking Active Self  Continuous Blood Gluc Sensor (FREESTYLE LIBRE 2 SENSOR) MISC [id number] Yes APPLY SENSOR EVERY 14 DAYS TO MONITOR SUGAR CONTINOUSLY Bedsole, Amy E, MD Taking Active   Dulaglutide (TRULICITY) 0.75 MG/0.5ML SOPN [id number] Yes Inject 0.75 mg into the skin once a week. Excell Seltzer, MD Taking Active   gabapentin (NEURONTIN) 100 MG capsule [id number] Yes Take 100 mg by mouth 3 (three) times daily as needed. [provider] Taking Active   glipiZIDE (GLUCOTROL) 5 MG tablet [id number] Yes TAKE 2 TABLETS (10 MG TOTAL) BY MOUTH 2 (TWO) TIMES DAILY BEFORE A MEAL. Excell Seltzer, MD Taking Active   glucose blood (ONETOUCH VERIO) test strip [id number] Yes Use to check blood sugar up to 2 times a day Excell Seltzer, MD Taking Active   Lancets [facility] DELICA PLUS [place]) MISC [id number] Yes Use to check blood sugar up to 2 times a day Excell Seltzer, MD Taking Active   letrozole [facility]) 2.5 MG tablet [id number] Yes TAKE 1 TABLET BY MOUTH EVERY DAY [name], MD Taking Active   levothyroxine (SYNTHROID) 88 MCG tablet [id number] Yes TAKE 1 TABLET BY MOUTH EVERY DAY Bedsole, Amy E, MD Taking Active   losartan (COZAAR) 100 MG tablet [id number] Yes TAKE 1 TABLET BY MOUTH EVERY DAY Excell Seltzer, MD Taking Active   NON Vickki Hearing [id number] Yes Ginsing root  [provider] Taking Active Self  Omega-3 Fatty Acids (FISH OIL OMEGA-3 PO) [id number] Yes Take 1 capsule by mouth daily. [provider] Taking Active Self  OVER THE COUNTER MEDICATION [id number] Yes Liver gallbladder support tablets [provider] Taking Active Self  OVER THE COUNTER MEDICATION [id number] Yes Leg and back pain relief tablets [provider] Taking Active Self  PREBIOTIC PRODUCT PO [id number] Yes Take by mouth. [provider] Taking Active Self  rosuvastatin (CRESTOR) 20 MG tablet [id number] Yes TAKE 1 TABLET BY MOUTH EVERY DAY Bedsole, Amy E, MD Taking Active   tiZANidine (ZANAFLEX) 4 MG tablet [id number] Yes TAKE 1 TABLET BY MOUTH AT BEDTIME AS NEEDED FOR MUSCLE SPASMS. Excell Seltzer, MD Taking Active   traMADol (ULTRAM) 50 MG tablet [id number] Yes Take 1 tablet (50 mg total) by mouth daily as needed. Excell Seltzer, MD Taking Active   Turmeric (QC TUMERIC COMPLEX PO) [id number] Yes Take 1 tablet by mouth daily. [provider] Taking Active Self  vitamin B-12 (CYANOCOBALAMIN) 1000 MCG tablet [id number] Yes Take 1,000 mcg by mouth daily. [provider] Taking Active   VITAMIN D PO [id number] Yes Take 2 each by mouth daily. [provider] Taking Active             SDOH:  (Social Determinants of  Health) assessments and interventions performed: No SDOH Interventions    Flowsheet Row Office Visit from 12/20/2022 in [facility] Clinical Support from 12/17/2022 in [facility] at [facility] Chronic Care Management from 01/15/2022 in [facility] at [facility] Social Work from 08/18/2021 in [facility] at [facility] Clinical Support from 08/28/2019 in [facility] at [facility] Clinical Support from 08/20/2018 in [facility] [place]  SDOH Interventions        Food  Insecurity Interventions -- Intervention Not Indicated Intervention Not Indicated Intervention Not Indicated -- --  Housing Interventions -- Intervention Not Indicated -- Intervention Not Indicated -- --  Transportation Interventions -- Intervention Not Indicated -- Intervention Not Indicated -- --  Utilities Interventions -- Intervention Not Indicated -- -- -- --  Alcohol Usage Interventions -- Intervention Not Indicated (Score <7) -- -- -- --  Depression Interventions/Treatment  Counseling -- -- -- PHQ2-9 Score <4 Follow-up Not Indicated --  [referral to PCP]  Financial Strain Interventions -- Intervention Not Indicated Intervention Not Indicated Intervention Not Indicated -- --  Physical Activity Interventions -- Patient Refused, Other (Comments) -- -- -- --  Stress Interventions -- Patient Refused, Intervention Not Indicated -- -- -- --  Social Connections Interventions -- Intervention Not Indicated -- -- -- --       Medication Assistance: None required.  Patient affirms current coverage meets needs.  Medication Access: Within the past 30 days, how often has patient missed a dose of medication? 0 Is a pillbox or other method used to improve adherence? Yes  Factors that may affect medication adherence? no barriers identified Are meds synced by current pharmacy? No  Are meds delivered by current pharmacy? No  Does patient experience delays in picking up medications due to transportation concerns? No   [facility] Services Reviewed: Is patient disadvantaged to use [facility]?: Yes  Current Rx insurance plan: HTA DM/heart Name and location of Current pharmacy:  [id number] - [place], [place] - [address] AT [facility] [address] [place] [address] Phone: [phone] Fax: [phone]  [facility] Pharm[facility] services reviewed with patient today?: No  Patient requests to transfer care to [facility]?: No  Reason patient declined to change pharmacies:  Disadvantaged due to insurance/mail order  Compliance/Adherence/Medication fill history: Care Gaps: UACR (due 11/2018)  Star-Rating Drugs: Trulicity - PDC 100% Glipizide - PDC 100% Losartan - PDC 100% Rosuvastatin - PDC 100%   ASSESSMENT / PLAN  Hypertension (BP goal <140/90) -Not ideally controlled - BP has been elevated in recent OV's (> 140/90), but pt was being seen for acute issues so BP may have only been temporarily elevated; she is not checking BP at home -Feb 2024:              146/73             142/71             151/80             138/74             132/71 -Current treatment: Amlodipine 5 mg daily - Appropriate, Effective, Safe, Accessible Losartan 100 mg daily - Appropriate, Effective, Safe, Accessible -Medications previously tried: n/a  -Educated on BP goals and benefits of medications for prevention of heart attack, stroke and kidney damage; -Counseled to monitor BP at home several times weekly; f/u 2-4 weeks -  Recommended to continue current medication  Diabetes (A1c goal <7%) -Not ideally controlled - A1c 7.4% (12/2022) increased from 6.7% previosly; -reviewed ED visit 10/20/22 for hyperglycemia, BG was > 400. Pt thought this was due to 2 handfuls of chocolate peanuts. Per chart review, pt did have a steroid injection in her wrist for carpal tunnel the day before the admission. Discussed this was likely the cause of the sustained hyperglycemia, not the peanuts. -Current home glucose readings:  Reviewed AGP report: 01/01/23 to 01/14/23. Sensor active: 87%  Time in range (70-180): 44% (goal > 70%)  High (180-250): 46%  Very high (>250): 10%  Low (< 70): 0% (goal < 4%)  GMI: 7.9%; Average glucose: 193  Previous AGP report: 11/03/22 to 11/16/22. Sensor active: 91%  Time in range (70-180): 58% (goal > 70%)  High (180-250): 37%  Very high (>250): 5%  Low (< 70): 0% (goal < 4%)  GMI: 7.5%; Average glucose: 176  -Diet: pt lives with her daughter who prepares most meals  and is not consistently following diabetic diet -Current medications: Glipizide 5 mg - 2 tab BID - Appropriate, Effective, Query Safe Trulicity 0.75 mg weekly - Appropriate, Effective, Safe, Accessible Freestyle Libre 2 - Appropriate, Effective, Safe, Accessible -Medications previously tried: metformin (SE) -Educated on A1c and blood sugar goals; Reviewed CGM goals (TIR > 70%);  -Discussed effect of steroid (injections and oral) on glucose control; advised that glucose control will need to be closely monitored following future steroid injections -Reviewed CGM report, it does demonstrate somewhat worse control compared to most recent A1c and previous CGM reports; pt may benefit from switching to Ozempic -Recommend to stop Trulicity; start Ozempic 0.5 mg weekly  Hyperlipidemia: (LDL goal < 100) -Controlled - LDL 95 (12/2022) at goal; TRIG 376 elevated; pt endorses compliance with rosuvastatin, she had bought it outside of insurance once so has extra supply -Current treatment: Rosuvastatin 20 mg daily - Appropriate, Effective, Safe, Accessible -Medications previously tried: n/a  -Educated on Cholesterol goals; Benefits of statin for ASCVD risk reduction; -Recommended to continue current medication  Chronic Kidney Disease Stage 3b  -All medications assessed for renal dosing and appropriateness in chronic kidney disease. -UACR 54 (12/2022); consider SGLT2 -Recommended to continue current medication  Breast cancer  -Dx 05/2021, s/p lumpectomy 07/2021, radiation 09/2021-11/2021; follows with oncology (Dr Pamelia Hoit), planning letrozole for 7 years -pt c/o of significant fatigue since completing radiation; recently moved letrozole to bedtime per advice from oncology, she has not noticed much difference yet -Current treatment  Letrozole 2.5 mg daily HS - Appropriate, Effective, Safe, Accessible Gabapentin 100 mg TID prn (surgical pain) -Appropriate, Effective, Safe, Accessible Tizanidine 4 mg HS prn  -Appropriate, Effective, Safe, Accessible -Medications previously tried: n/a  -Recommended to continue current medication  Fatigue -Improving - pt reports significant fatigue since completing radiation; she has had labwork per PCP that was WNL; sleep study was considered but pt declined; -Pt did start using Zquil ZZZ's supplement (melatonin, chamomile, lavender)  Health Maintenance -Vaccine gaps: Shingrix, covid booster -HypoTH: stable on levothyroxine   Al Corpus, PharmD, BCACP Clinical Pharmacist [facility] [phone]

## 2023-02-04 ENCOUNTER — Other Ambulatory Visit (HOSPITAL_COMMUNITY): Payer: Self-pay

## 2023-02-04 MED ORDER — TRULICITY 1.5 MG/0.5ML ~~LOC~~ SOAJ
1.5000 mg | SUBCUTANEOUS | 3 refills | Status: DC
  Filled 2023-02-04: qty 2, 28d supply, fill #0

## 2023-02-04 NOTE — Addendum Note (Signed)
Addended by: Kathyrn Sheriff on: 02/04/2023 04:48 PM   Modules accepted: Orders

## 2023-02-04 NOTE — Telephone Encounter (Signed)
Patient is currently taking Trulicity 0.75 mg and recently we recommended to switch to Ozempic due to Trulicity supply issues. Now insurance copay for Ozempic is $200 despite it being on the same tier as Trulicity per insurance formulary.   Ordered Trulicity 1.5 mg to [facility] to check price.

## 2023-02-04 NOTE — Progress Notes (Signed)
Contacted the patient to let her know PA done for ozempic. Patient states pharmacy told her rx cost 200.00. Patient confused as her insurance should cover all diabetic medications and she knows she should not be near the donut hole.

## 2023-02-04 NOTE — Telephone Encounter (Signed)
Ozempic PA has been approved through 02/01/24.

## 2023-02-12 MED ORDER — OZEMPIC (0.25 OR 0.5 MG/DOSE) 2 MG/3ML ~~LOC~~ SOPN: 0.5000 mg | PEN_INJECTOR | SUBCUTANEOUS | 1 refills | Status: DC

## 2023-02-12 NOTE — Telephone Encounter (Signed)
Pt reports price for Trulicity is $325 now in donut hole. Price for Ozempic $250. She requests cheaper alternative or will just stay with glipizide only.  Patient has been taking Trulicity 0.75 mg and glipizide 10 mg/day, per CGM her GMI was 7.9% with this regimen. Without Trulicity glucose control would likely deteriorate further.  She has failed metformin previously due to side effects. SGLT2-I is an option but would likely be similar cost to GLP-1 RA. Actos or insulin are also options, though not preferred due to side effects but could be an option if nothing else is affordable.  Discussed the above with patient. She refuses Actos or insulin (she says "I will never take insulin as it is a fat storing drug"). She also refuses nutrition consult, as her daughter cooks her meals and is "not receptive" to diet requests/changes so she thinks it would be a waste of time.  Pt initially reported her income would be too high for PAP, but tried test-run application for [employer] and it was determined that she will qualify.  Submitted application online for Ozempic PAP.

## 2023-02-12 NOTE — Addendum Note (Signed)
Addended by: Kathyrn Sheriff on: 02/12/2023 12:28 PM   Modules accepted: Orders

## 2023-02-14 ENCOUNTER — Other Ambulatory Visit (HOSPITAL_BASED_OUTPATIENT_CLINIC_OR_DEPARTMENT_OTHER): Payer: Self-pay

## 2023-02-18 ENCOUNTER — Ambulatory Visit: Payer: PPO | Attending: General Surgery

## 2023-02-18 VITALS — Wt 152.4 lb

## 2023-02-18 DIAGNOSIS — Z483 Aftercare following surgery for neoplasm: Secondary | ICD-10-CM | POA: Insufficient documentation

## 2023-02-18 NOTE — Therapy (Signed)
OUTPATIENT PHYSICAL THERAPY SOZO SCREENING NOTE   Patient Name: April Ross MRN: [id number] DOB:1951/01/27, 72 y.o., female Today's Date: 02/18/2023  PCP: Excell Seltzer, MD REFERRING PROVIDER: Griselda Miner, MD   PT End of Session - 02/18/23 5784     Visit Number 19   # unchanged due to screen only   PT Start Time 0831    PT Stop Time 0835    PT Time Calculation (min) 4 min    Activity Tolerance Patient tolerated treatment well    Behavior During Therapy [facility] for tasks assessed/performed             Past Medical History:  Diagnosis Date   Allergy    Arthritis    Breast cancer (HCC)    right side 07/2021   Cancer (HCC)    CRD (chronic renal disease)    stage 3   Depression    Diabetes mellitus without complication (HCC)    GERD (gastroesophageal reflux disease)    Hyperlipidemia    Hypertension    LBBB (left bundle branch block)    a. diagnosed in 10/2017 - echo showed a preserved EF of 55-60% and NST was low-risk showing no evidence of ischemia.    Osteoporosis    polyps in colon    Thyroid disease    Urinary tract infection    Past Surgical History:  Procedure Laterality Date   ABDOMINAL HYSTERECTOMY     APPENDECTOMY     BREAST BIOPSY Right    BREAST LUMPECTOMY Right 07/2021   BREAST LUMPECTOMY WITH RADIOACTIVE SEED AND SENTINEL LYMPH NODE BIOPSY Right 07/24/2021   Procedure: RIGHT BREAST LUMPECTOMY WITH RADIOACTIVE SEED AND SENTINEL LYMPH NODE BIOPSY;  Surgeon: Griselda Miner, MD;  Location: [facility];  Service: General;  Laterality: Right;   CARPAL TUNNEL RELEASE Right 11/14/2022   Procedure: CARPAL TUNNEL RELEASE;  Surgeon: Marlyne Beards, MD;  Location: [facility];  Service: Orthopedics;  Laterality: Right;  or MAC with local   Patient Active Problem List   Diagnosis Date Noted   Chronic tension-type headache, not intractable 07/25/2021   Carcinoma of upper-inner quadrant of right breast in female, estrogen receptor  positive (HCC) 06/15/2021   Diabetic retinopathy (HCC) 11/24/2020   Chronic bilateral thoracic back pain 11/21/2018   Type 2 diabetes mellitus with nephropathy (HCC) 10/27/2017   Osteoporosis 05/24/2016   CKD (chronic kidney disease) stage 3, GFR 30-59 ml/min (HCC) 05/08/2016   Hyperlipidemia associated with type 2 diabetes mellitus (HCC) 01/12/2016   Hypothyroidism 01/12/2016   Hypertension associated with diabetes (HCC) 01/12/2016   Allergic rhinitis 01/12/2016   Moderate major depression, single episode (HCC) 01/12/2016   Chronic insomnia 01/12/2016   GERD (gastroesophageal reflux disease) 01/12/2016    REFERRING DIAG: right breast cancer at risk for lymphedema  THERAPY DIAG: Aftercare following surgery for neoplasm  PERTINENT HISTORY: Pt will be had a Rt lumpectomy with 2/11 lymph nodes positive on 07/24/21.Had infection and drainage just proximal to incision site during radiatio Done with radiation 2/7/20923 . No chemotherapy and now on antiestrogen.   Pt has an increase in SOZO and so is now wearing a sleeve for 4 weeks  other history includes: DM, stage 3 CKD, osteoporosis  PRECAUTIONS: right UE Lymphedema risk, None  SUBJECTIVE: Pt returns for her L-Dex screen. "I had my carpal tunnel surgery and I've been fighting the lymphedema since then. I've been wearing my compression sleeve during the day and my nighttime garment."  PAIN:  Are you having pain? No, not currently, Rt wrist is still just tender from the surgery  SOZO SCREENING:Patient was assessed today using the SOZO machine to determine the lymphedema index score. This was compared to her baseline score. It was determined that she is NOT within the recommended range when compared to her baseline. She has already resumed wearing her compression sleeve for day and night and her change from baseline was only slightly elevated above 6.5 to 7.5 and still being in yellow. So decided to cont 3 month monitoring with L-Dex as pt  already has been treated by this clinic, is doing well with her maintenance phase of treatment since surgery and pt was agreeable to this and knows to call us sooner if needed.     L-DEX FLOWSHEETS - 02/18/23 0800       L-DEX LYMPHEDEMA SCREENING   Measurement Type Unilateral    L-DEX MEASUREMENT EXTREMITY Upper Extremity    POSITION  Standing    DOMINANT SIDE Right    At Risk Side Right    BASELINE SCORE (UNILATERAL) -5.9    L-DEX SCORE (UNILATERAL) 1.6    VALUE CHANGE (UNILAT) 7.5            P: Has pt returned to [facility] since her carpal tunnel surgery?   Hermenia Bers, PTA 02/18/2023, 8:35 AM

## 2023-02-19 ENCOUNTER — Telehealth: Payer: Self-pay

## 2023-02-19 NOTE — Progress Notes (Signed)
Care Management & Coordination Services Pharmacy Team  Reason for Encounter: Appointment Reminder  Contacted patient to confirm telephone appointment with Al Corpus , PharmD on 02/22/23 at 10:00. Spoke with patient on 02/19/2023   Do you have any problems getting your medications? No  What is your top health concern you would like to discuss at your upcoming visit?   Medication change   Have you seen any other providers since your last visit with PCP? Yes  Physical therapy   Hospital visits:  None in previous 6 months   Star Rating Drugs:  Medication:  Last Fill: Day Supply Glipizide 5mg   10/30/22 90 Losartan 100mg  10/29/22 90 Rosuvastatin 20mg   12/11/22  90 Ozempic   Care Gaps: Annual wellness visit in last year? Yes  If Diabetic: Last eye exam / retinopathy screening:UTD Last diabetic foot exam:UTD   Al Corpus, PharmD notified  Burt Knack, [facility] Clinical Pharmacy Assistant [phone]

## 2023-02-22 ENCOUNTER — Telehealth: Payer: Self-pay

## 2023-02-22 ENCOUNTER — Ambulatory Visit: Payer: PPO | Admitting: Pharmacist

## 2023-02-22 NOTE — Progress Notes (Signed)
Care Management & Coordination Services Pharmacy Note  02/22/2023 Name:  April Ross MRN:  [id number] DOB:  12-07-1950  Summary: F/U visit -DM: A1c 7.4% (12/2022), increased from 6.7% previously; over the past 2 weeks pt watched a "diabetes master class" online and has cut out several foods (dairy, grain, soy, fruit sugar); she continues on Trulicity 0.75 mg and has 2 pens left; Ozempic PAP has been approved and awaiting shipment -Reviewed AGP report: 02/09/23 to 03/04/23. Sensor active: 91%  Time in range (70-180): 79% (improved from 44% !!)  High (180-250): 19%  Very high (>250): 2%  Low (< 70): 0% (goal < 4%)  GMI: 7.0% (improved from 7.9%); Average glucose: 154 (improved from 193)  Recommendations/Changes made from today's visit: -Congratulated on diet changes and significant improvement in glucose per CGM -Advised to start Ozempic 0.5 mg when PAP shipment arrives in 1-2 weeks  Follow up plan: -Pharmacist follow up televisit scheduled for 1 month -PCP app 03/26/23; DEXA/MMG 06/27/23; Oncology appt 08/12/23    Subjective: April Ross is a 72 y.o. year old female who is a primary patient of Bedsole, Amy E, MD.  The care coordination team was consulted for assistance with disease management and care coordination needs.    Engaged with patient by telephone for follow up visit.  Recent office visits: 12/20/22 Dr Ermalene Searing OV: f/u- A1c 7.4%; unable to locate Trulicity 1.5 (supply issue), stays on 0.75 mg  06/21/22 Dr Ermalene Searing OV: f/u - DM foot exam done. Referred for lung cancer screening. Encouraged smoking cessation.  Recent consult visits: 10/19/21 Dr Frazier Butt ([facility]): carpal tunnel  08/09/22 Dr Pamelia Hoit (Oncology): breast cancer - continue letrozole x 7 years (started 11/2021). Daytime fatigue - advised reduce gabapentin to 1 per day.  Hospital visits: 11/14/22 planned Admission - carpal tunnel release   10/20/22 ED visit (AP): hyperglycemia, chest pain. Glucose 411. Improved  with Aspart 6 units.   Objective:  Lab Results  Component Value Date   CREATININE 1.23 (H) 12/13/2022   BUN 30 (H) 12/13/2022   GFR 43.51 (L) 12/13/2022   GFRNONAA 43 (L) 10/20/2022   GFRAA 37 (L) 04/12/2020   NA 141 12/13/2022   K 4.2 12/13/2022   CALCIUM 9.7 12/13/2022   CO2 26 12/13/2022   GLUCOSE 194 (H) 12/13/2022    Lab Results  Component Value Date/Time   HGBA1C 7.4 (H) 12/13/2022 07:28 AM   HGBA1C 6.7 (H) 06/14/2022 07:29 AM   GFR 43.51 (L) 12/13/2022 07:28 AM   GFR 41.23 (L) 06/14/2022 07:29 AM   MICROALBUR 27.0 (H) 12/13/2022 07:28 AM   MICROALBUR 5.0 (H) 11/19/2017 10:18 AM    Last diabetic Eye exam:  Lab Results  Component Value Date/Time   HMDIABEYEEXA No Retinopathy 12/24/2022 01:21 PM    Last diabetic Foot exam:  Lab Results  Component Value Date/Time   HMDIABFOOTEX done 06/11/2022 12:00 AM     Lab Results  Component Value Date   CHOL 169 12/13/2022   HDL 31.90 (L) 12/13/2022   LDLCALC 61 10/28/2017   LDLDIRECT 95.0 12/13/2022   TRIG 376.0 (H) 12/13/2022   CHOLHDL 5 12/13/2022       Latest Ref Rng & Units 12/13/2022    7:28 AM 06/14/2022    7:29 AM 12/08/2021    7:30 AM  Hepatic Function  Total Protein 6.0 - 8.3 g/dL 6.8  7.2  6.5   Albumin 3.5 - 5.2 g/dL 4.4  4.5  4.3   AST 0 - 37 U/L 21  20  24   ALT 0 - 35 U/L 29  27  25    Alk Phosphatase 39 - 117 U/L 45  48  65   Total Bilirubin 0.2 - 1.2 mg/dL 0.5  0.5  0.5     Lab Results  Component Value Date/Time   TSH 2.37 12/08/2021 07:30 AM   TSH 2.89 11/18/2020 08:13 AM   FREET4 1.15 12/08/2021 07:30 AM   FREET4 1.22 11/18/2020 08:13 AM       Latest Ref Rng & Units 10/20/2022    7:07 AM 03/16/2022    9:27 AM 12/01/2020    6:35 PM  CBC  WBC 4.0 - 10.5 K/uL 15.0  6.2  8.5   Hemoglobin 12.0 - 15.0 g/dL 54.0  98.1  19.1   Hematocrit 36.0 - 46.0 % 40.6  40.7  38.2   Platelets 150 - 400 K/uL 224  197.0  240     Lab Results  Component Value Date/Time   VD25OH 70.19 03/16/2022 09:27 AM    VD25OH 49.99 02/18/2018 08:22 AM   VITAMINB12 557 03/16/2022 09:27 AM    Clinical ASCVD: No  The 10-year ASCVD risk score (Arnett DK, et al., 2019) is: 44.2%   Values used to calculate the score:     Age: 72 years     Sex: Female     Is Non-Hispanic African American: No     Diabetic: Yes     Tobacco smoker: Yes     Systolic Blood Pressure: 130 mmHg     Is BP treated: Yes     HDL Cholesterol: 31.9 mg/dL     Total Cholesterol: 169 mg/dL        4/78/2956    2:13 AM 12/17/2022    9:37 AM 12/15/2021    9:10 AM  Depression screen PHQ 2/9  Decreased Interest 1 0 2  Down, Depressed, Hopeless 1 1 2   PHQ - 2 Score 2 1 4   Altered sleeping 3  3  Tired, decreased energy 3  2  Change in appetite 0  0  Feeling bad or failure about yourself  0  0  Trouble concentrating 0  0  Moving slowly or fidgety/restless 0  0  Suicidal thoughts 0  0  PHQ-9 Score 8  9  Difficult doing work/chores Not difficult at all       Social History   Tobacco Use  Smoking Status Former   Packs/day: 1.00   Years: 15.00   Additional pack years: 0.00   Total pack years: 15.00   Types: Cigarettes  Smokeless Tobacco Never   BP Readings from Last 3 Encounters:  12/20/22 130/60  11/14/22 (!) 143/58  10/20/22 (!) 152/72   Pulse Readings from Last 3 Encounters:  12/20/22 69  11/14/22 65  10/20/22 75   Wt Readings from Last 3 Encounters:  02/18/23 152 lb 6 oz (69.1 kg)  12/20/22 154 lb 4 oz (70 kg)  12/17/22 155 lb (70.3 kg)   BMI Readings from Last 3 Encounters:  02/18/23 27.87 kg/m  12/20/22 28.21 kg/m  12/17/22 28.35 kg/m    Allergies  Allergen Reactions   Acetaminophen     Hayworth    Asa [Aspirin]     Frigon groin and axilla area   Ibuprofen    Other Encalade    Mycolog-II & HRT Hormone patch    Medications Reviewed Today     Reviewed by Kathyrn Sheriff, RPH (Pharmacist) on 02/22/23 at 1046  Med List Status: <  None>   Medication Order Taking? Sig Documenting Provider Last Dose  Status Informant  Alcohol Swabs (B-D SINGLE USE SWABS REGULAR) PADS [id number] Yes Use to check blood sugar up to 2 times a day Bedsole, Amy E, MD Taking Active   amLODipine (NORVASC) 5 MG tablet [id number] Yes TAKE 1 TABLET (5 MG TOTAL) BY MOUTH DAILY. Excell Seltzer, MD Taking Active   APPLE CIDER VINEGAR PO [id number] Yes Take by mouth. [provider] Taking Active Self  Blood Glucose Monitoring Suppl (ONETOUCH VERIO REFLECT) w/Device KIT [id number] Yes Use to check blood sugar up to 2 times a day Bedsole, Amy E, MD Taking Active   cetirizine (ZYRTEC) 10 MG tablet [id number] Yes Take 10 mg by mouth at bedtime as needed for allergies. [provider] Taking Active Self  CINNAMON PO [id number] Yes Take 1 tablet by mouth daily. [provider] Taking Active Self  Continuous Blood Gluc Sensor (FREESTYLE LIBRE 2 SENSOR) MISC [id number] Yes APPLY SENSOR EVERY 14 DAYS TO MONITOR SUGAR CONTINOUSLY Bedsole, Amy E, MD Taking Active   Dulaglutide (TRULICITY) 0.75 MG/0.5ML SOPN [id number] Yes Inject 0.75 mg into the skin once a week. [provider] Taking Active            Med Note Kathyrn Sheriff   Fri Feb 22, 2023 10:46 AM) In process of switching to Ozempic May-June 2024  gabapentin (NEURONTIN) 100 MG capsule [id number] Yes Take 100 mg by mouth 3 (three) times daily as needed. [provider] Taking Active   glipiZIDE (GLUCOTROL) 5 MG tablet [id number] Yes TAKE 2 TABLETS (10 MG TOTAL) BY MOUTH 2 (TWO) TIMES DAILY BEFORE A MEAL. Excell Seltzer, MD Taking Active   glucose blood (ONETOUCH VERIO) test strip [id number] Yes Use to check blood sugar up to 2 times a day Excell Seltzer, MD Taking Active   Lancets [facility] DELICA PLUS Lorenzo) MISC [id number] Yes Use to check blood sugar up to 2 times a day Excell Seltzer, MD Taking Active   letrozole [facility]) 2.5 MG tablet [id number] Yes TAKE 1 TABLET BY MOUTH EVERY DAY [name], MD Taking Active   levothyroxine  (SYNTHROID) 88 MCG tablet [id number] Yes TAKE 1 TABLET BY MOUTH EVERY DAY Bedsole, Amy E, MD Taking Active   losartan (COZAAR) 100 MG tablet [id number] Yes TAKE 1 TABLET BY MOUTH EVERY DAY Excell Seltzer, MD Taking Active   NON Vickki Hearing [id number] Yes Ginsing root [provider] Taking Active Self  Omega-3 Fatty Acids (FISH OIL OMEGA-3 PO) [id number] Yes Take 1 capsule by mouth daily. [provider] Taking Active Self  OVER THE COUNTER MEDICATION [id number] Yes Liver gallbladder support tablets [provider] Taking Active Self  OVER THE COUNTER MEDICATION [id number] Yes Leg and back pain relief tablets [provider] Taking Active Self  PREBIOTIC PRODUCT PO [id number] Yes Take by mouth. [provider] Taking Active Self  rosuvastatin (CRESTOR) 20 MG tablet [id number] Yes TAKE 1 TABLET BY MOUTH EVERY DAY Bedsole, Amy E, MD Taking Active   Semaglutide,0.25 or 0.5MG /DOS, (OZEMPIC, 0.25 OR 0.5 MG/DOSE,) 2 MG/3ML SOPN [id number] No Inject 0.5 mg into the skin once a week. Novo Cares PAP  Patient not taking: Reported on 02/22/2023   Excell Seltzer, MD Not Taking Active            Med Note Kathyrn Sheriff   Fri Feb 22, 2023 10:45 AM) Pending Novo Cares shipment to office  tiZANidine (ZANAFLEX) 4 MG tablet [id number] Yes  TAKE 1 TABLET BY MOUTH AT BEDTIME AS NEEDED FOR MUSCLE SPASMS. Excell Seltzer, MD Taking Active   traMADol (ULTRAM) 50 MG tablet [id number] Yes Take 1 tablet (50 mg total) by mouth daily as needed. Excell Seltzer, MD Taking Active   Turmeric (QC TUMERIC COMPLEX PO) [id number] Yes Take 1 tablet by mouth daily. [provider] Taking Active Self  vitamin B-12 (CYANOCOBALAMIN) 1000 MCG tablet [id number] Yes Take 1,000 mcg by mouth daily. [provider] Taking Active   VITAMIN D PO [id number] Yes Take 2 each by mouth daily. [provider] Taking Active             SDOH:  (Social Determinants of Health) assessments  and interventions performed: No SDOH Interventions    Flowsheet Row Office Visit from 12/20/2022 in [facility] [place] [facility] Clinical Support from 12/17/2022 in [facility] Chronic Care Management from 01/15/2022 in [facility] Social Work from 08/18/2021 in [facility] Clinical Support from 08/28/2019 in [facility] [place] [facility] Clinical Support from 08/20/2018 in [facility] [place]  SDOH Interventions        Food Insecurity Interventions -- Intervention Not Indicated Intervention Not Indicated Intervention Not Indicated -- --  Housing Interventions -- Intervention Not Indicated -- Intervention Not Indicated -- --  Transportation Interventions -- Intervention Not Indicated -- Intervention Not Indicated -- --  Utilities Interventions -- Intervention Not Indicated -- -- -- --  Alcohol Usage Interventions -- Intervention Not Indicated (Score <7) -- -- -- --  Depression Interventions/Treatment  Counseling -- -- -- PHQ2-9 Score <4 Follow-up Not Indicated --  [referral to PCP]  Financial Strain Interventions -- Intervention Not Indicated Intervention Not Indicated Intervention Not Indicated -- --  Physical Activity Interventions -- Patient Refused, Other (Comments) -- -- -- --  Stress Interventions -- Patient Refused, Intervention Not Indicated -- -- -- --  Social Connections Interventions -- Intervention Not Indicated -- -- -- --       Medication Assistance: None required.  Patient affirms current coverage meets needs.  Medication Access: Within the past 30 days, how often has patient missed a dose of medication? 0 Is a pillbox or other method used to improve adherence? Yes  Factors that may affect medication adherence? no barriers identified Are meds synced by current pharmacy? No  Are meds delivered by current  pharmacy? No  Does patient experience delays in picking up medications due to transportation concerns? No   Upstream Services Reviewed: Is patient disadvantaged to use [facility]?: Yes  Current Rx insurance plan: H[employer] DM/heart Name and location of Current pharmacy:  [id number] - [place], [place] - [address] AT [facility] [address] [place] [address] Phone: [phone] Fax: [phone]  [facility] Pharmacy services reviewed with patient today?: No  Patient requests to transfer care to [facility] Pharmacy?: No  Reason patient declined to change pharmacies: Disadvantaged due to insurance/mail order  Compliance/Adherence/Medication fill history: Care Gaps: UACR (due 11/2018)  Star-Rating Drugs: Trulicity - PDC 100% Glipizide - PDC 100% Losartan - PDC 100% Rosuvastatin - PDC 100%   ASSESSMENT / PLAN  Hypertension (BP goal <140/90) -Controlled - BP at goal in recent OV; she is not checking BP at home -Current treatment: Amlodipine 5 mg daily - Appropriate, Effective, Safe, Accessible Losartan 100 mg daily - Appropriate, Effective, Safe, Accessible -Medications previously tried:  n/a  -Educated on BP goals and benefits of medications for prevention of heart attack, stroke and kidney damage; -Counseled to monitor BP at home several times weekly -Recommended to continue current medication  Diabetes (A1c goal <7%) -Not ideally controlled - A1c 7.4% (12/2022) increased from 6.7% previously; -Pt watched a "diabetes master class" online (NP Scherrie November) and has cut out top 5 inflammatory foods (dairy, grains, soy, corn, fruit sugar) -Current home glucose readings:  Reviewed AGP report: 02/09/23 to 03/04/23. Sensor active: 91%  Time in range (70-180): 79% (goal > 70%)  High (180-250): 19%  Very high (>250): 2%  Low (< 70): 0% (goal < 4%)  GMI: 7.0%; Average glucose: 154  Previous AGP report: 01/01/23 to 01/14/23. Sensor active: 87%  Time in range (70-180):  44% (goal > 70%)  High (180-250): 46%  Very high (>250): 10%  Low (< 70): 0% (goal < 4%)  GMI: 7.9%; Average glucose: 193  -Diet: pt lives with her daughter who prepares most meals and is not consistently following diabetic diet -Current medications: Glipizide 5 mg - 2 tab BID - Appropriate, Effective, Query Safe Trulicity 0.75 mg weekly - Appropriate, Effective, Safe, Accessible Ozempic 0.5 mg weekly -  PAP approved, awaiting shipment Freestyle Libre 2 - Appropriate, Effective, Safe, Accessible -Medications previously tried: metformin (SE) -Educated on A1c and blood sugar goals; Reviewed CGM goals (TIR > 70%);  -Previously discussed effect of steroid (injections and oral) on glucose control; advised that glucose control will need to be closely monitored following future steroid injections -Reviewed CGM report, it does demonstrate improved control likely due to dietary changes -Advised to finish Trulicity supply; start Ozempic 0.5 mg weekly when PAP shipment arrives  Hyperlipidemia: (LDL goal < 100) -Controlled - LDL 95 (12/2022) at goal; TRIG 376 elevated; pt endorses compliance with rosuvastatin, she had bought it outside of insurance once so has extra supply -Current treatment: Rosuvastatin 20 mg daily - Appropriate, Effective, Safe, Accessible -Medications previously tried: n/a  -Educated on Cholesterol goals; Benefits of statin for ASCVD risk reduction; -Recommended to continue current medication  Chronic Kidney Disease Stage 3b  -All medications assessed for renal dosing and appropriateness in chronic kidney disease. -UACR 54 (12/2022); consider SGLT2 -Recommended to continue current medication  Breast cancer  -Dx 05/2021, s/p lumpectomy 07/2021, radiation 09/2021-11/2021; follows with oncology (Dr Pamelia Hoit), planning letrozole for 7 years -pt c/o of significant fatigue since completing radiation; recently moved letrozole to bedtime per advice from oncology, she has not noticed much  difference yet -Current treatment  Letrozole 2.5 mg daily HS - Appropriate, Effective, Safe, Accessible Gabapentin 100 mg TID prn (surgical pain) -Appropriate, Effective, Safe, Accessible Tizanidine 4 mg HS prn -Appropriate, Effective, Safe, Accessible -Medications previously tried: n/a  -Recommended to continue current medication  Fatigue -Improving - pt reports significant fatigue since completing radiation; she has had labwork per PCP that was WNL; sleep study was considered but pt declined; -Pt did start using Zquil ZZZ's supplement (melatonin, chamomile, lavender)  Health Maintenance -Vaccine gaps: Shingrix, covid booster -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

## 2023-02-22 NOTE — Progress Notes (Cosign Needed)
Care Management & Coordination Services Pharmacy Team  Reason for Encounter: PAP  Ozempic   Contacted Novo Cares to get update on application for Ozempic. Patient is approved thru 02/09/2023 for the medication Ozempic. The medication will be shipped to office of [facility] in 10-14 days .   Al Corpus, PharmD notified  Burt Knack, [facility] Clinical Pharmacy Assistant [phone]

## 2023-02-24 ENCOUNTER — Other Ambulatory Visit: Payer: Self-pay | Admitting: Family Medicine

## 2023-02-25 NOTE — Telephone Encounter (Signed)
Last office visit 12/20/2022 for CPE.  Last refilled 12/04/22 for #90 with no refills.  Next Appt: 03/26/23 for DM.

## 2023-02-26 ENCOUNTER — Encounter: Payer: PPO | Admitting: Pharmacist

## 2023-02-28 ENCOUNTER — Telehealth: Payer: Self-pay | Admitting: *Deleted

## 2023-02-28 NOTE — Telephone Encounter (Signed)
Received from [employer] PAP:  Ozempic 2.5/0.5 mg/74ml pen x 5 pens.  Lot # [id number] Exp: 08/07/2024.   April Ross notified by telephone that medication is ready to be picked up at our office.

## 2023-03-01 NOTE — Telephone Encounter (Signed)
I'll be available 10-11, 12-12:30, or after 3:30 and would be happy to assist.  Velmena - please let patient know time frames to come in

## 2023-03-01 NOTE — Telephone Encounter (Signed)
Patient will come by on Tuesday to pick up medication. She was wanting to know if someone would be able to show her how to administer the medication.

## 2023-03-05 ENCOUNTER — Ambulatory Visit: Payer: PPO | Admitting: Pharmacist

## 2023-03-05 NOTE — Progress Notes (Signed)
Patient picked up Ozempic PAP today and requested demonstration for how to use device. Demonstrated how to attach needle, prime the pen, dial up dose to 0.25 mg, inject in abdomen and hold for several seconds. Discussed needle disposal and storing medication in refrigerator. Patient's last dose of Trulicity was Sunday 5/26, advised to wait until next Sunday 6/2 to start Ozempic. Advised to start with 0.25 mg x 3-4 weeks then increase to 0.5 mg thereafter. Pt handles demonstration device and voiced understanding of how to use and store Ozempic.   F/u call 03/25/23 for update on glucose/Ozempic tolerability.

## 2023-03-21 ENCOUNTER — Telehealth: Payer: Self-pay | Admitting: Pharmacist

## 2023-03-21 NOTE — Telephone Encounter (Signed)
Noted  

## 2023-03-21 NOTE — Telephone Encounter (Signed)
Contacted patient to reschedule appointment with Mardella Layman.   She does note that she started Ozempic 0.25 mg weekly about 2 weeks ago. First week, she had nausea about 2 days after the dose. The next week, she had significant vomiting the day after the injection. However, she notes she is doing better and would like to continue the medication at this time. I advised to stop the medication and call us if continued vomiting after dosing.   Keep follow up with Dr. Ermalene Searing on 6/25. I have scheduled follow up with myself 4 weeks after that.   Catie Eppie Gibson, PharmD, BCACP, CPP [facility] [phone]

## 2023-03-25 ENCOUNTER — Encounter: Payer: PPO | Admitting: Pharmacist

## 2023-03-26 ENCOUNTER — Ambulatory Visit: Payer: PPO | Admitting: Family Medicine

## 2023-04-02 ENCOUNTER — Ambulatory Visit (INDEPENDENT_AMBULATORY_CARE_PROVIDER_SITE_OTHER): Payer: PPO | Admitting: Family Medicine

## 2023-04-02 ENCOUNTER — Encounter: Payer: Self-pay | Admitting: Family Medicine

## 2023-04-02 VITALS — BP 130/60 | HR 62 | Temp 97.5°F | Ht 62.0 in | Wt 150.1 lb

## 2023-04-02 DIAGNOSIS — R809 Proteinuria, unspecified: Secondary | ICD-10-CM | POA: Diagnosis not present

## 2023-04-02 DIAGNOSIS — L989 Disorder of the skin and subcutaneous tissue, unspecified: Secondary | ICD-10-CM | POA: Diagnosis not present

## 2023-04-02 DIAGNOSIS — Z7984 Long term (current) use of oral hypoglycemic drugs: Secondary | ICD-10-CM | POA: Diagnosis not present

## 2023-04-02 DIAGNOSIS — E1121 Type 2 diabetes mellitus with diabetic nephropathy: Secondary | ICD-10-CM | POA: Diagnosis not present

## 2023-04-02 DIAGNOSIS — Z7985 Long-term (current) use of injectable non-insulin antidiabetic drugs: Secondary | ICD-10-CM | POA: Diagnosis not present

## 2023-04-02 DIAGNOSIS — E039 Hypothyroidism, unspecified: Secondary | ICD-10-CM | POA: Diagnosis not present

## 2023-04-02 LAB — T3, FREE: T3, Free: 3 pg/mL (ref 2.3–4.2)

## 2023-04-02 LAB — T4, FREE: Free T4: 1.27 ng/dL (ref 0.60–1.60)

## 2023-04-02 LAB — POCT GLYCOSYLATED HEMOGLOBIN (HGB A1C): Hemoglobin A1C: 6.8 % — AB (ref 4.0–5.6)

## 2023-04-02 LAB — TSH: TSH: 0.31 u[IU]/mL — ABNORMAL LOW (ref 0.35–5.50)

## 2023-04-02 NOTE — Assessment & Plan Note (Signed)
New finding, now with improved diabetes control on Ozempic.  Also on angiotensin receptor blocker losartan for kidney protection.

## 2023-04-02 NOTE — Progress Notes (Signed)
Patient ID: April Ross, female    DOB: January 09, 1951, 72 y.o.   MRN: [id number]  This visit was conducted in person.  BP 130/60 (BP Location: Left Arm, Patient Position: Sitting, Cuff Size: Normal)   Pulse 62   Temp (!) 97.5 F (36.4 C) (Temporal)   Ht 5\' 2"  (1.575 m)   Wt 150 lb 2 oz (68.1 kg)   PF 97 L/min   BMI 27.46 kg/m    CC:  Chief Complaint  Patient presents with   Diabetes    Subjective:   HPI: April Ross is a 72 y.o. female presenting on 04/02/2023 for Diabetes  Diabetes: Now improved on glipizide 10 mg twice daily and Ozempic 0.5 mg weekly. Some nausea with ozempic, seems to be improving with time. Unable to find Trulicity consistently.   Stopped metformin 10/26/2021 given worries about this meds.. has been on for years.  She is eating a very low carb diet. She is avoiding sugar other than fruit, has stopped dairy. Increasing fiber. Lab Results  Component Value Date   HGBA1C 6.8 (A) 04/02/2023  Using medications without difficulties: Hypoglycemic episodes: none Hyperglycemic episodes: occ Feet problems: none Blood Sugars averaging: FBS  eye exam within last year: none  Microalbuminuria:  On Ozempic and ARB losartan Wt Readings from Last 3 Encounters:  04/02/23 150 lb 2 oz (68.1 kg)  02/18/23 152 lb 6 oz (69.1 kg)  12/20/22 154 lb 4 oz (70 kg)    Hypertension:  good control in office on amlodipine and losartan. BP Readings from Last 3 Encounters:  04/02/23 130/60  12/20/22 130/60  11/14/22 (!) 143/58  Using medication without problems or lightheadedness:  none Chest pain with exertion: none Edema: none Short of breath: none Average home BPs: Other issues:    Hypothyroid: Due for reevaluation  on levo 88 mcg daily Lab Results  Component Value Date   TSH 2.37 12/08/2021    Breast cancer: active treatment on letrozole. Oncology note from 08/2022 reviewed. Dr. Pamelia Hoit   Has   2-3 month noted skin lesion on upper back, no bleeding or  itching. She is rubbing the area a lot.   Relevant past medical, surgical, family and social history reviewed and updated as indicated. Interim medical history since our last visit reviewed. Allergies and medications reviewed and updated. Outpatient Medications Prior to Visit  Medication Sig Dispense Refill   Alcohol Swabs (B-D SINGLE USE SWABS REGULAR) PADS Use to check blood sugar up to 2 times a day 100 each 5   amLODipine (NORVASC) 5 MG tablet TAKE 1 TABLET (5 MG TOTAL) BY MOUTH DAILY. 90 tablet 3   APPLE CIDER VINEGAR PO Take by mouth.     Blood Glucose Monitoring Suppl (ONETOUCH VERIO REFLECT) w/Device KIT Use to check blood sugar up to 2 times a day 1 kit 0   cetirizine (ZYRTEC) 10 MG tablet Take 10 mg by mouth at bedtime as needed for allergies.     CINNAMON PO Take 1 tablet by mouth daily.     Continuous Blood Gluc Sensor (FREESTYLE LIBRE 2 SENSOR) MISC APPLY SENSOR EVERY 14 DAYS TO MONITOR SUGAR CONTINOUSLY 2 each 5   gabapentin (NEURONTIN) 100 MG capsule Take 100 mg by mouth 3 (three) times daily as needed.     glipiZIDE (GLUCOTROL) 5 MG tablet TAKE 2 TABLETS (10 MG TOTAL) BY MOUTH 2 (TWO) TIMES DAILY BEFORE A MEAL. 360 tablet 0   glucose blood (ONETOUCH VERIO) test strip Use to  check blood sugar up to 2 times a day 100 each 5   Lancets (ONETOUCH DELICA PLUS LANCET30G) MISC Use to check blood sugar up to 2 times a day 100 each 5   letrozole (FEMARA) 2.5 MG tablet TAKE 1 TABLET BY MOUTH EVERY DAY 90 tablet 3   levothyroxine (SYNTHROID) 88 MCG tablet TAKE 1 TABLET BY MOUTH EVERY DAY 90 tablet 0   losartan (COZAAR) 100 MG tablet TAKE 1 TABLET BY MOUTH EVERY DAY 90 tablet 3   NON FORMULARY Ginsing root     Omega-3 Fatty Acids (FISH OIL OMEGA-3 PO) Take 1 capsule by mouth daily.     OVER THE COUNTER MEDICATION Liver gallbladder support tablets     OVER THE COUNTER MEDICATION Leg and back pain relief tablets     PREBIOTIC PRODUCT PO Take by mouth.     rosuvastatin (CRESTOR) 20 MG  tablet TAKE 1 TABLET BY MOUTH EVERY DAY 90 tablet 3   Semaglutide,0.25 or 0.5MG /DOS, (OZEMPIC, 0.25 OR 0.5 MG/DOSE,) 2 MG/3ML SOPN Inject 0.5 mg into the skin once a week. Novo Cares PAP 3 mL 1   tiZANidine (ZANAFLEX) 4 MG tablet TAKE 1 TABLET BY MOUTH AT BEDTIME AS NEEDED FOR MUSCLE SPASMS. 90 tablet 0   traMADol (ULTRAM) 50 MG tablet Take 1 tablet (50 mg total) by mouth daily as needed. 30 tablet 0   Turmeric (QC TUMERIC COMPLEX PO) Take 1 tablet by mouth daily.     vitamin B-12 (CYANOCOBALAMIN) 1000 MCG tablet Take 1,000 mcg by mouth daily.     VITAMIN D PO Take 2 each by mouth daily.     No facility-administered medications prior to visit.     Per HPI unless specifically indicated in ROS section below Review of Systems  Constitutional:  Negative for fatigue and fever.  HENT:  Negative for congestion.   Eyes:  Negative for pain.  Respiratory:  Negative for cough and shortness of breath.   Cardiovascular:  Negative for chest pain, palpitations and leg swelling.       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  Genitourinary:  Negative for dysuria and vaginal bleeding.  Musculoskeletal:  Negative for back pain.  Neurological:  Negative for syncope, light-headedness and headaches.  Psychiatric/Behavioral:  Negative for dysphoric mood.    Objective:  BP 130/60 (BP Location: Left Arm, Patient Position: Sitting, Cuff Size: Normal)   Pulse 62   Temp (!) 97.5 F (36.4 C) (Temporal)   Ht 5\' 2"  (1.575 m)   Wt 150 lb 2 oz (68.1 kg)   PF 97 L/min   BMI 27.46 kg/m   Wt Readings from Last 3 Encounters:  04/02/23 150 lb 2 oz (68.1 kg)  02/18/23 152 lb 6 oz (69.1 kg)  12/20/22 154 lb 4 oz (70 kg)      Physical Exam Constitutional:      General: She is not in acute distress.    Appearance: Normal appearance. She is well-developed. She is not ill-appearing or toxic-appearing.  HENT:     Head: Normocephalic.     Right Ear: Hearing,  tympanic membrane, ear canal and external ear normal. Tympanic membrane is not erythematous, retracted or bulging.     Left Ear: Hearing, tympanic membrane, ear canal and external ear normal. Tympanic membrane is not erythematous, retracted or bulging.     Nose: No mucosal edema or rhinorrhea.     Right Sinus: No maxillary sinus tenderness or frontal sinus tenderness.  Left Sinus: No maxillary sinus tenderness or frontal sinus tenderness.     Mouth/Throat:     Pharynx: Uvula midline.  Eyes:     General: Lids are normal. Lids are everted, no foreign bodies appreciated.     Conjunctiva/sclera: Conjunctivae normal.     Pupils: Pupils are equal, round, and reactive to light.  Neck:     Thyroid: No thyroid mass or thyromegaly.     Vascular: No carotid bruit.     Trachea: Trachea normal.  Cardiovascular:     Rate and Rhythm: Normal rate and regular rhythm.     Pulses: Normal pulses.     Heart sounds: Normal heart sounds, S1 normal and S2 normal. No murmur heard.    No friction rub. No gallop.  Pulmonary:     Effort: Pulmonary effort is normal. No tachypnea or respiratory distress.     Breath sounds: Normal breath sounds. No decreased breath sounds, wheezing, rhonchi or rales.  Abdominal:     General: Bowel sounds are normal.     Palpations: Abdomen is soft.     Tenderness: There is no abdominal tenderness.  Musculoskeletal:     Cervical back: Normal range of motion and neck supple.  Skin:    General: Skin is warm and dry.     Findings: No Portela.     Comments: Raised irregular skin lesion on upper back with associated thickened dry skin  Neurological:     Mental Status: She is alert.  Psychiatric:        Mood and Affect: Mood is not anxious or depressed.        Speech: Speech normal.        Behavior: Behavior normal. Behavior is cooperative.        Thought Content: Thought content normal.        Judgment: Judgment normal.       Results for orders placed or performed in visit on  04/02/23  POCT glycosylated hemoglobin (Hb A1C)  Result Value Ref Range   Hemoglobin A1C 6.8 (A) 4.0 - 5.6 %   HbA1c POC (<> result, manual entry)     HbA1c, POC (prediabetic range)     HbA1c, POC (controlled diabetic range)      This visit occurred during the SARS-CoV-2 public health emergency.  Safety protocols were in place, including screening questions prior to the visit, additional usage of staff PPE, and extensive cleaning of exam room while observing appropriate contact time as indicated for disinfecting solutions.   COVID 19 screen:  No recent travel or known exposure to COVID19 The patient denies respiratory symptoms of COVID 19 at this time. The importance of social distancing was discussed today.   Assessment and Plan    Problem List Items Addressed This Visit     Hypothyroidism - Primary     Chronic, Due for reevaluation on 88 mcg of levothyroxine      Relevant Orders   TSH   T3, free   T4, free   Microalbuminuria    New finding, now with improved diabetes control on Ozempic.  Also on angiotensin receptor blocker losartan for kidney protection.      Skin lesion of back    Acute, new changing skin lesion in high risk sun exposure site of upper back. Possible squamous cell carcinoma versus irritated seborrheic keratosis.  Will refer to dermatology for excision biopsy.      Relevant Orders   Ambulatory referral to Dermatology   Type 2 diabetes mellitus  with nephropathy (HCC)    Chronic, improved control now on Ozempic as unable to find Trulicity consistently. Some associated nausea and vomiting initially with Ozempic at 0.25 mg weekly, has improved over time.  Ozempic 0.25 mg weekly... will now increase to 0.5 mg weekly.   Glipizide 10 mg BID      Relevant Orders   POCT glycosylated hemoglobin (Hb A1C) (Completed)   Kerby Nora, MD

## 2023-04-02 NOTE — Assessment & Plan Note (Signed)
Chronic, Due for reevaluation on 88 mcg of levothyroxine

## 2023-04-02 NOTE — Assessment & Plan Note (Addendum)
Chronic, improved control now on Ozempic as unable to find Trulicity consistently. Some associated nausea and vomiting initially with Ozempic at 0.25 mg weekly, has improved over time.  Ozempic 0.25 mg weekly... will now increase to 0.5 mg weekly.   Glipizide 10 mg BID

## 2023-04-02 NOTE — Patient Instructions (Signed)
Increase Ozempic to 0.5 mg weekly if tolerated. Keep up the great work on low carbohydrate diet and regular exercise. Stop at the lab on the way out for thyroid testing. Referral placed for dermatology you will hear from our referral coordinator or the dermatology office in the next several weeks.

## 2023-04-02 NOTE — Assessment & Plan Note (Signed)
Acute, new changing skin lesion in high risk sun exposure site of upper back. Possible squamous cell carcinoma versus irritated seborrheic keratosis.  Will refer to dermatology for excision biopsy.

## 2023-04-05 ENCOUNTER — Other Ambulatory Visit: Payer: Self-pay | Admitting: Family Medicine

## 2023-04-05 DIAGNOSIS — E1121 Type 2 diabetes mellitus with diabetic nephropathy: Secondary | ICD-10-CM

## 2023-04-05 NOTE — Telephone Encounter (Signed)
Last office visit 04/02/2023 for DM.  Last refilled 09/26/2022 for #30 with no refills.  Next Appt:  No future appointments with PCP.

## 2023-04-22 DIAGNOSIS — H40013 Open angle with borderline findings, low risk, bilateral: Secondary | ICD-10-CM | POA: Diagnosis not present

## 2023-05-06 ENCOUNTER — Other Ambulatory Visit: Payer: PPO | Admitting: Pharmacist

## 2023-05-06 DIAGNOSIS — E1121 Type 2 diabetes mellitus with diabetic nephropathy: Secondary | ICD-10-CM

## 2023-05-06 NOTE — Progress Notes (Unsigned)
05/06/2023 Name: April Ross MRN: [id number] DOB: April 13, 1951  Chief Complaint  Patient presents with   Medication Management   Diabetes    April Ross is a 72 y.o. year old female who presented for a telephone visit.   They were referred to the pharmacist by their PCP for assistance in managing diabetes, hypertension, and hyperlipidemia.    Subjective:  Care Team: Primary Care Provider: Excell Seltzer, MD ; Next Scheduled Visit:   Medication Access/Adherence  Current Pharmacy:  [facility] [phone] - [place], [place] - [address] AT [facility] [address] [place] [address] Phone: [phone] Fax: [phone]   Patient reports affordability concerns with their medications: No  Patient reports access/transportation concerns to their pharmacy: No  Patient reports adherence concerns with their medications:  No   Diabetes:  Current medications: Ozempic 0.5 mg weekly, glipizide 10 mg twice daily Medications tried in the past: metformin   Date of Download: 7/26-7/29/24 % Time CGM is active: 89% Average Glucose: 163 mg/dL Glucose Management Indicator: 7.2% Glucose Variability: 23.2 (goal <36%) Time in Goal:  - Time in range 70-180: 72% - Time above range: 28% - Time below range: 0%  Notes some compression lows overnight if she sleeps on the arm that the Josephine Igo is in. Also discussed that she has an issue with her Josephine Igo 2 staying connected to her phone. It also often prompts her to do a confirmatory glucometer stick   Patient reports hypoglycemic s/sx including dizziness, shakiness, sweating periodically   Hypertension:  Current medications: losartan 100 mg daily, amlodipine 5 mg daily  Hyperlipidemia/ASCVD Risk Reduction  Current lipid lowering medications: rosuvastatin 20 mg daily   Objective:  Lab Results  Component Value Date   HGBA1C 6.8 (A) 04/02/2023    Lab Results  Component Value Date   CREATININE 1.23 (H) 12/13/2022   BUN  30 (H) 12/13/2022   NA 141 12/13/2022   K 4.2 12/13/2022   CL 106 12/13/2022   CO2 26 12/13/2022    Lab Results  Component Value Date   CHOL 169 12/13/2022   HDL 31.90 (L) 12/13/2022   LDLCALC 61 10/28/2017   LDLDIRECT 95.0 12/13/2022   TRIG 376.0 (H) 12/13/2022   CHOLHDL 5 12/13/2022    Medications Reviewed Today     Reviewed by Alden Hipp, RPH-CPP (Pharmacist) on 05/06/23 at 1536  Med List Status: <None>   Medication Order Taking? Sig Documenting Provider Last Dose Status Informant  Alcohol Swabs (B-D SINGLE USE SWABS REGULAR) PADS [id number] No Use to check blood sugar up to 2 times a day Bedsole, Amy E, MD Taking Active   amLODipine (NORVASC) 5 MG tablet [id number] No TAKE 1 TABLET (5 MG TOTAL) BY MOUTH DAILY. Excell Seltzer, MD Taking Active   APPLE CIDER VINEGAR PO [id number] No Take by mouth. [provider] Taking Active Self  Blood Glucose Monitoring Suppl (ONETOUCH VERIO REFLECT) w/Device KIT [id number] No Use to check blood sugar up to 2 times a day Bedsole, Amy E, MD Taking Active   cetirizine (ZYRTEC) 10 MG tablet [id number] No Take 10 mg by mouth at bedtime as needed for allergies. [provider] Taking Active Self  CINNAMON PO [id number] No Take 1 tablet by mouth daily. [provider] Taking Active Self  Continuous Blood Gluc Sensor (FREESTYLE LIBRE 2 SENSOR) MISC [id number] No APPLY SENSOR EVERY 14 DAYS TO MONITOR SUGAR CONTINOUSLY Bedsole, Amy E, MD Taking Active   gabapentin (NEURONTIN) 100 MG capsule  [id number] No Take 100 mg by mouth 3 (three) times daily as needed. [provider] Taking Active   glipiZIDE (GLUCOTROL) 5 MG tablet [id number]  TAKE 2 TABLETS (10 MG TOTAL) BY MOUTH 2 (TWO) TIMES DAILY BEFORE A MEAL. Bedsole, Amy E, MD  Active   glucose blood (ONETOUCH VERIO) test strip [id number] No Use to check blood sugar up to 2 times a day Excell Seltzer, MD Taking Active   Lancets [facility] DELICA PLUS [facility]) MISC  [id number] No Use to check blood sugar up to 2 times a day Excell Seltzer, MD Taking Active   letrozole [facility]) 2.5 MG tablet [id number] No TAKE 1 TABLET BY MOUTH EVERY DAY [name], MD Taking Active   levothyroxine (SYNTHROID) 88 MCG tablet [id number] No TAKE 1 TABLET BY MOUTH EVERY DAY Bedsole, Amy E, MD Taking Active   losartan (COZAAR) 100 MG tablet [id number] No TAKE 1 TABLET BY MOUTH EVERY DAY Excell Seltzer, MD Taking Active   NON FORMULARY [id number] No Ginsing root [provider] Taking Active Self  Omega-3 Fatty Acids (FISH OIL OMEGA-3 PO) [id number] No Take 1 capsule by mouth daily. [provider] Taking Active Self  OVER THE COUNTER MEDICATION [id number] No Liver gallbladder support tablets [provider] Taking Active Self  OVER THE COUNTER MEDICATION [id number] No Leg and back pain relief tablets [provider] Taking Active Self  PREBIOTIC PRODUCT PO [id number] No Take by mouth. [provider] Taking Active Self  rosuvastatin (CRESTOR) 20 MG tablet [id number] No TAKE 1 TABLET BY MOUTH EVERY DAY Bedsole, Amy E, MD Taking Active   Semaglutide,0.25 or 0.5MG /DOS, (OZEMPIC, 0.25 OR 0.5 MG/DOSE,) 2 MG/3ML SOPN [id number] No Inject 0.5 mg into the skin once a week. Novo Cares PAP Vinton, Amy E, MD Taking Active            Med Note Raeanne Gathers May 06, 2023  3:06 PM)    tiZANidine (ZANAFLEX) 4 MG tablet [id number] No TAKE 1 TABLET BY MOUTH AT BEDTIME AS NEEDED FOR MUSCLE SPASMS. Excell Seltzer, MD Taking Active   traMADol (ULTRAM) 50 MG tablet [id number]  TAKE 1 TABLET BY MOUTH DAILY AS NEEDED. Excell Seltzer, MD  Active   Turmeric (QC TUMERIC COMPLEX PO) [id number] No Take 1 tablet by mouth daily. [provider] Taking Active Self  vitamin B-12 (CYANOCOBALAMIN) 1000 MCG tablet [id number] No Take 1,000 mcg by mouth daily. [provider] Taking Active   VITAMIN D PO [id number] No Take 2 each by mouth daily. [provider] Taking Active               Assessment/Plan:   Diabetes: - Currently controlled per A1c, but elevated in the last 2 weeks per CGM readings.  - Reviewed goal A1c, goal fasting, and goal 2 hour post prandial glucose - Recommend to increase Ozempic to 1 mg weekly, reduce glipizide to 5 mg twice daily to reduce risk of hypoglycemia. Recommend that next step of therapy be to pursue patient assistance for SGLT2 given CKD. Patient is amenable to both of these changes. Will discuss with PCP.  - Recommend to check glucose continuously using CGM. Discussed expected differences between blood glucose and interstial tissue glucose. After patient completed current supply of Libre 2, will consider changing to [place] 3 in case this is better compatible with her phone  Hypertension: - Currently controlled - Recommend to continue current regimen at this time   Hyperlipidemia/ASCVD Risk  Reduction: - Currently uncontrolled, recommend goal LDL <70 given diabetes, hypertension, and CKD as risk factors. Will discuss moving forward.   Follow Up Plan: phone call in 4 weeks  Catie TClearance Coots, PharmD, BCACP, CPP Clinical Pharmacist [facility] [phone]

## 2023-05-06 NOTE — Patient Instructions (Signed)
Charlanne,   It was great talking to you today! Increase Ozempic to 1 mg weekly. You can take two doses of 0.5 mg one after the other.   Reduce glipizide to 5 mg twice daily. Please reach out with any episodes of low blood sugars <70.   Catie Eppie Gibson, PharmD, BCACP, CPP Clinical Pharmacist [facility] [phone]

## 2023-05-07 ENCOUNTER — Telehealth: Payer: Self-pay | Admitting: Pharmacist

## 2023-05-07 MED ORDER — GLIPIZIDE 5 MG PO TABS
5.0000 mg | ORAL_TABLET | Freq: Two times a day (BID) | ORAL | Status: DC
Start: 2023-05-07 — End: 2023-08-22

## 2023-05-07 MED ORDER — SEMAGLUTIDE (1 MG/DOSE) 4 MG/3ML ~~LOC~~ SOPN: 1.0000 mg | PEN_INJECTOR | SUBCUTANEOUS | Status: DC

## 2023-05-07 NOTE — Progress Notes (Signed)
Novo Nordisk order form for 1 mg placed in Dr. Daphine Deutscher box for signature.   Once signed, please fax to [employer] and scan form into patient's chart.   Thanks, team!

## 2023-05-07 NOTE — Telephone Encounter (Signed)
Novo Nordisk PAP application faxed to [phone].

## 2023-05-20 ENCOUNTER — Other Ambulatory Visit: Payer: Self-pay | Admitting: Family Medicine

## 2023-05-20 NOTE — Telephone Encounter (Signed)
Last office visit 04/02/23 for DM.  Last refilled 02/26/23 for #90 with no refills.  Next appt: No future appointments with PCP.

## 2023-05-24 ENCOUNTER — Telehealth: Payer: Self-pay | Admitting: *Deleted

## 2023-05-24 NOTE — Telephone Encounter (Signed)
Received from [employer] PAP:  Ozempic 4mg /7ml Lot # [id number] Exp: 11/07/2025 x 4 pens. Aunisty notified by telephone that medication is ready to be picked up here at the office.

## 2023-05-26 ENCOUNTER — Other Ambulatory Visit: Payer: Self-pay | Admitting: Family Medicine

## 2023-05-26 DIAGNOSIS — E1121 Type 2 diabetes mellitus with diabetic nephropathy: Secondary | ICD-10-CM

## 2023-05-27 ENCOUNTER — Ambulatory Visit: Payer: PPO | Attending: General Surgery

## 2023-05-27 VITALS — Wt 149.2 lb

## 2023-05-27 DIAGNOSIS — Z483 Aftercare following surgery for neoplasm: Secondary | ICD-10-CM | POA: Insufficient documentation

## 2023-05-27 NOTE — Therapy (Signed)
OUTPATIENT PHYSICAL THERAPY SOZO SCREENING NOTE   Patient Name: April Ross MRN: [id number] DOB:1951-06-27, 72 y.o., female Today's Date: 05/27/2023  PCP: Excell Seltzer, MD REFERRING PROVIDER: Griselda Miner, MD   PT End of Session - 05/27/23 [phone]     Visit Number 19   # unchanged due to screen only   PT Start Time 0831    PT Stop Time 0839    PT Time Calculation (min) 8 min    Activity Tolerance Patient tolerated treatment well    Behavior During Therapy [facility] for tasks assessed/performed             Past Medical History:  Diagnosis Date   Allergy    Arthritis    Breast cancer (HCC)    right side 07/2021   Cancer (HCC)    CRD (chronic renal disease)    stage 3   Depression    Diabetes mellitus without complication (HCC)    GERD (gastroesophageal reflux disease)    Hyperlipidemia    Hypertension    LBBB (left bundle branch block)    a. diagnosed in 10/2017 - echo showed a preserved EF of 55-60% and NST was low-risk showing no evidence of ischemia.    Osteoporosis    polyps in colon    Thyroid disease    Urinary tract infection    Past Surgical History:  Procedure Laterality Date   ABDOMINAL HYSTERECTOMY     APPENDECTOMY     BREAST BIOPSY Right    BREAST LUMPECTOMY Right 07/2021   BREAST LUMPECTOMY WITH RADIOACTIVE SEED AND SENTINEL LYMPH NODE BIOPSY Right 07/24/2021   Procedure: RIGHT BREAST LUMPECTOMY WITH RADIOACTIVE SEED AND SENTINEL LYMPH NODE BIOPSY;  Surgeon: Griselda Miner, MD;  Location: [facility];  Service: General;  Laterality: Right;   CARPAL TUNNEL RELEASE Right 11/14/2022   Procedure: CARPAL TUNNEL RELEASE;  Surgeon: Marlyne Beards, MD;  Location: [facility];  Service: Orthopedics;  Laterality: Right;  or MAC with local   Patient Active Problem List   Diagnosis Date Noted   Microalbuminuria 04/02/2023   Skin lesion of back 04/02/2023   Chronic tension-type headache, not intractable 07/25/2021   Carcinoma of  upper-inner quadrant of right breast in female, estrogen receptor positive (HCC) 06/15/2021   Diabetic retinopathy (HCC) 11/24/2020   Chronic bilateral thoracic back pain 11/21/2018   Type 2 diabetes mellitus with nephropathy (HCC) 10/27/2017   Osteoporosis 05/24/2016   CKD (chronic kidney disease) stage 3, GFR 30-59 ml/min (HCC) 05/08/2016   Hyperlipidemia associated with type 2 diabetes mellitus (HCC) 01/12/2016   Hypothyroidism 01/12/2016   Hypertension associated with diabetes (HCC) 01/12/2016   Allergic rhinitis 01/12/2016   Moderate major depression, single episode (HCC) 01/12/2016   Chronic insomnia 01/12/2016   GERD (gastroesophageal reflux disease) 01/12/2016    REFERRING DIAG: right breast cancer at risk for lymphedema  THERAPY DIAG: Aftercare following surgery for neoplasm  PERTINENT HISTORY: Pt will be had a Rt lumpectomy with 2/11 lymph nodes positive on 07/24/21.Had infection and drainage just proximal to incision site during radiatio Done with radiation 2/7/20923 . No chemotherapy and now on antiestrogen.   Pt has an increase in SOZO and so is now wearing a sleeve for 4 weeks  other history includes: DM, stage 3 CKD, osteoporosis  PRECAUTIONS: right UE Lymphedema risk, None  SUBJECTIVE: Pt returns for her L-Dex screen.   PAIN:  Are you having pain? No  SOZO SCREENING:Patient was assessed today using the SOZO  machine to determine the lymphedema index score. This was compared to her baseline score. It was determined that she is NOT within the recommended range when compared to her baseline. She has already resumed wearing her compression sleeve for day and night and her change from baseline was only slightly elevated above 6.5 to 7.5 and still being in yellow. So decided to cont 3 month monitoring with L-Dex as pt already has been treated by this clinic, is doing well with her maintenance phase of treatment since surgery and pt was agreeable to this and knows to call us  sooner if needed.     L-DEX FLOWSHEETS - 05/27/23 0800       L-DEX LYMPHEDEMA SCREENING   Measurement Type Unilateral    L-DEX MEASUREMENT EXTREMITY Upper Extremity    POSITION  Standing    DOMINANT SIDE Right    At Risk Side Right    BASELINE SCORE (UNILATERAL) -5.9    L-DEX SCORE (UNILATERAL) -3    VALUE CHANGE (UNILAT) 2.9            P: Pt to work on weaning off of wearing her day and nighttime compression slowly and will return for 1 more 3 month L-Dex screen. If # still WNL's will begin 6 months.    Hermenia Bers, PTA 05/27/2023, 8:40 AM

## 2023-06-03 ENCOUNTER — Telehealth: Payer: Self-pay | Admitting: Family Medicine

## 2023-06-03 NOTE — Telephone Encounter (Signed)
Received from [employer] PAP:  Ozempic 0.25/0.5 mg Lot [id number] Exp: 10/07/2024 x 5 pens.  Left message for April Ross that medication is here at the office to be picked up.

## 2023-06-13 ENCOUNTER — Other Ambulatory Visit: Payer: PPO | Admitting: Pharmacist

## 2023-06-13 NOTE — Progress Notes (Signed)
Care Coordination Call  Called patient for appointment but she did not have time to discuss today due to family commitments, requested to reschedule. Discussed that we could reschedule with the new clinic pharmacist when her schedule is open.   Patient noted she increased glipizide back to 10 mg twice daily as she was having some higher sugars. Denies hypoglycemia since then.   Received supply of Ozempic 0.5 mg order, she has been injecting 2 doses of 0.5 mg weekly. Ozempic 1 mg order was faxed to [facility] already.   Will plan to reschedule appointment.   Catie Eppie Gibson, PharmD, BCACP, CPP Clinical Pharmacist [facility] [phone]

## 2023-06-20 ENCOUNTER — Encounter: Payer: Self-pay | Admitting: Dermatology

## 2023-06-20 ENCOUNTER — Ambulatory Visit (INDEPENDENT_AMBULATORY_CARE_PROVIDER_SITE_OTHER): Payer: PPO | Admitting: Dermatology

## 2023-06-20 VITALS — BP 137/70 | HR 68

## 2023-06-20 DIAGNOSIS — W908XXA Exposure to other nonionizing radiation, initial encounter: Secondary | ICD-10-CM | POA: Diagnosis not present

## 2023-06-20 DIAGNOSIS — L814 Other melanin hyperpigmentation: Secondary | ICD-10-CM | POA: Diagnosis not present

## 2023-06-20 DIAGNOSIS — D229 Melanocytic nevi, unspecified: Secondary | ICD-10-CM

## 2023-06-20 DIAGNOSIS — L578 Other skin changes due to chronic exposure to nonionizing radiation: Secondary | ICD-10-CM

## 2023-06-20 DIAGNOSIS — D1801 Hemangioma of skin and subcutaneous tissue: Secondary | ICD-10-CM | POA: Diagnosis not present

## 2023-06-20 DIAGNOSIS — L821 Other seborrheic keratosis: Secondary | ICD-10-CM

## 2023-06-20 DIAGNOSIS — Z1283 Encounter for screening for malignant neoplasm of skin: Secondary | ICD-10-CM | POA: Diagnosis not present

## 2023-06-20 NOTE — Patient Instructions (Addendum)
Skin Education :  Wynelle Link screen (SPF 30 or greater) should be applied during peak UV exposure (between 10am and 2pm) and reapplied after exercise or swimming.  The ABCDEs of melanoma were reviewed with the patient, and the importance of monthly self-examination of moles was emphasized. Should any moles change in shape or color, or itch, bleed or burn, pt will contact our office for evaluation sooner then their interval appointment.  Plan: Sunscreen Recommendations We recommended a broad spectrum sunscreen with a SPF of 30 or higher.  SPF 30 sunscreens block approximately 97 percent of the sun's harmful rays. Sunscreens should be applied at least 15 minutes prior to expected sun exposure and then every 2 hours after that as long as sun exposure continues. If swimming or exercising sunscreen should be reapplied every 45 minutes to an hour after getting wet or sweating. One ounce, or the equivalent of a shot glass full of sunscreen, is adequate to protect the skin not covered by a bathing suit. We also recommended a lip balm with a sunscreen as well. Sun protective clothing can be used in lieu of sunscreen but must be worn the entire time you are exposed to the sun's rays.    Important Information   Due to recent changes in healthcare laws, you may see results of your pathology and/or laboratory studies on MyChart before the doctors have had a chance to review them. We understand that in some cases there may be results that are confusing or concerning to you. Please understand that not all results are received at the same time and often the doctors may need to interpret multiple results in order to provide you with the best plan of care or course of treatment. Therefore, we ask that you please give [place] 2 business days to thoroughly review all your results before contacting the office for clarification. Should we see a critical lab result, you will be contacted sooner.     If You Need Anything After Your  Visit   If you have any questions or concerns for your doctor, please call our main line at [phone]. If no one answers, please leave a voicemail as directed and we will return your call as soon as possible. Messages left after 4 pm will be answered the following business day.    You may also send [place] a message via MyChart. We typically respond to MyChart messages within 1-2 business days.  For prescription refills, please ask your pharmacy to contact our office. Our fax number is [phone].  If you have an urgent issue when the clinic is closed that cannot wait until the next business day, you can page your doctor at the number below.     Please note that while we do our best to be available for urgent issues outside of office hours, we are not available 24/7.    If you have an urgent issue and are unable to reach [place], you may choose to seek medical care at your doctor's office, retail clinic, urgent care center, or emergency room.   If you have a medical emergency, please immediately call 911 or go to the emergency department. In the event of inclement weather, please call our main line at [phone] for an update on the status of any delays or closures.  Dermatology Medication Tips: Please keep the boxes that topical medications come in in order to help keep track of the instructions about where and how to use these. Pharmacies typically print the medication instructions  only on the boxes and not directly on the medication tubes.   If your medication is too expensive, please contact our office at [phone] or send [place] a message through MyChart.    We are unable to tell what your co-pay for medications will be in advance as this is different depending on your insurance coverage. However, we may be able to find a substitute medication at lower cost or fill out paperwork to get insurance to cover a needed medication.    If a prior authorization is required to get your medication  covered by your insurance company, please allow [place] 1-2 business days to complete this process.   Drug prices often vary depending on where the prescription is filled and some pharmacies may offer cheaper prices.   The website www.goodrx.com contains coupons for medications through different pharmacies. The prices here do not account for what the cost may be with help from insurance (it may be cheaper with your insurance), but the website can give you the price if you did not use any insurance.  - You can print the associated coupon and take it with your prescription to the pharmacy.  - You may also stop by our office during regular business hours and pick up a GoodRx coupon card.  - If you need your prescription sent electronically to a different pharmacy, notify our office through [facility] or by phone at [phone]

## 2023-06-20 NOTE — Progress Notes (Signed)
   New Patient Visit   Subjective  April Ross is a 72 y.o. female who presents for the following: Skin Cancer Screening and Full Body Skin Exam  The patient presents for Total-Body Skin Exam (TBSE) for skin cancer screening and mole check. The patient has spots, moles and lesions to be evaluated, some may be new or changing and the patient may have concern these could be cancer. Pt has had skin cancer in the past which she had Mohs on. No family hx of skin cancer Last skin check many years ago  The following portions of the chart were reviewed this encounter and updated as appropriate: medications, allergies, medical history  Review of Systems:  No other skin or systemic complaints except as noted in HPI or Assessment and Plan.  Objective  Well appearing patient in no apparent distress; mood and affect are within normal limits.  A full examination was performed including scalp, head, eyes, ears, nose, lips, neck, chest, axillae, abdomen, back, buttocks, bilateral upper extremities, bilateral lower extremities, hands, feet, fingers, toes, fingernails, and toenails. All findings within normal limits unless otherwise noted below.   Relevant physical exam findings are noted in the Assessment and Plan.    Assessment & Plan   SKIN CANCER SCREENING PERFORMED TODAY.  ACTINIC DAMAGE - Chronic condition, secondary to cumulative UV/sun exposure - diffuse scaly erythematous macules with underlying dyspigmentation - Recommend daily broad spectrum sunscreen SPF 30+ to sun-exposed areas, reapply every 2 hours as needed.  - Staying in the shade or wearing long sleeves, sun glasses (UVA+UVB protection) and wide brim hats (4-inch brim around the entire circumference of the hat) are also recommended for sun protection.  - Call for new or changing lesions.  MELANOCYTIC NEVI - Tan-brown and/or pink-flesh-colored symmetric macules and papules - Benign appearing on exam today - Observation - Call  clinic for new or changing moles - Recommend daily use of broad spectrum spf 30+ sunscreen to sun-exposed areas.   SEBORRHEIC KERATOSIS - Stuck-on, waxy, tan-brown papules and/or plaques legs, arms and trunk - Benign-appearing - Discussed benign etiology and prognosis. - Observe - Call for any changes Will monitor spot on tip of nose  LENTIGINES Exam: scattered tan macules Due to sun exposure Treatment Plan: Benign-appearing, observe. Recommend daily broad spectrum sunscreen SPF 30+ to sun-exposed areas, reapply every 2 hours as needed.  Call for any changes    HEMANGIOMA Exam: red papule(s) trunk Discussed benign nature. Recommend observation. Call for changes.    Return in about 3 months (around 09/19/2023), or spot check.  I, Tillie Fantasia, CMA, am acting as scribe for Gwenith Daily, MD.   Documentation: I have reviewed the above documentation for accuracy and completeness, and I agree with the above.  Gwenith Daily, MD

## 2023-06-27 ENCOUNTER — Ambulatory Visit
Admission: RE | Admit: 2023-06-27 | Discharge: 2023-06-27 | Disposition: A | Discharge status: Home / Self Care | Payer: PPO | Source: Ambulatory Visit | Attending: Family Medicine | Admitting: Family Medicine

## 2023-06-27 DIAGNOSIS — Z90722 Acquired absence of ovaries, bilateral: Secondary | ICD-10-CM | POA: Diagnosis not present

## 2023-06-27 DIAGNOSIS — Z9889 Other specified postprocedural states: Secondary | ICD-10-CM

## 2023-06-27 DIAGNOSIS — N958 Other specified menopausal and perimenopausal disorders: Secondary | ICD-10-CM | POA: Diagnosis not present

## 2023-06-27 DIAGNOSIS — M810 Age-related osteoporosis without current pathological fracture: Secondary | ICD-10-CM

## 2023-06-27 DIAGNOSIS — N186 End stage renal disease: Secondary | ICD-10-CM | POA: Diagnosis not present

## 2023-06-27 DIAGNOSIS — M8588 Other specified disorders of bone density and structure, other site: Secondary | ICD-10-CM | POA: Diagnosis not present

## 2023-06-27 DIAGNOSIS — R92333 Mammographic heterogeneous density, bilateral breasts: Secondary | ICD-10-CM | POA: Diagnosis not present

## 2023-06-27 DIAGNOSIS — E349 Endocrine disorder, unspecified: Secondary | ICD-10-CM | POA: Diagnosis not present

## 2023-06-27 DIAGNOSIS — Z853 Personal history of malignant neoplasm of breast: Secondary | ICD-10-CM | POA: Diagnosis not present

## 2023-06-29 ENCOUNTER — Other Ambulatory Visit: Payer: Self-pay | Admitting: Family Medicine

## 2023-07-01 NOTE — Telephone Encounter (Signed)
Last office visit 04/02/2023 for DM.  Last refilled Tramadol 04/05/2023 for #30 with no refills.  Tizanidine 05/21/2023 for #30 with no refills.  Next Appt: No future appointments with PCP.

## 2023-07-23 ENCOUNTER — Ambulatory Visit (INDEPENDENT_AMBULATORY_CARE_PROVIDER_SITE_OTHER)
Admission: RE | Admit: 2023-07-23 | Discharge: 2023-07-23 | Disposition: A | Discharge status: Home / Self Care | Payer: PPO | Source: Ambulatory Visit | Attending: Family Medicine | Admitting: Family Medicine

## 2023-07-23 ENCOUNTER — Ambulatory Visit (INDEPENDENT_AMBULATORY_CARE_PROVIDER_SITE_OTHER): Payer: PPO | Admitting: Family Medicine

## 2023-07-23 VITALS — BP 130/66 | HR 73 | Temp 97.1°F | Ht 62.0 in | Wt 141.1 lb

## 2023-07-23 DIAGNOSIS — S2242XA Multiple fractures of ribs, left side, initial encounter for closed fracture: Secondary | ICD-10-CM | POA: Diagnosis not present

## 2023-07-23 DIAGNOSIS — E1121 Type 2 diabetes mellitus with diabetic nephropathy: Secondary | ICD-10-CM

## 2023-07-23 DIAGNOSIS — M438X4 Other specified deforming dorsopathies, thoracic region: Secondary | ICD-10-CM | POA: Diagnosis not present

## 2023-07-23 DIAGNOSIS — R0789 Other chest pain: Secondary | ICD-10-CM

## 2023-07-23 DIAGNOSIS — M546 Pain in thoracic spine: Secondary | ICD-10-CM

## 2023-07-23 DIAGNOSIS — M810 Age-related osteoporosis without current pathological fracture: Secondary | ICD-10-CM | POA: Diagnosis not present

## 2023-07-23 DIAGNOSIS — C50919 Malignant neoplasm of unspecified site of unspecified female breast: Secondary | ICD-10-CM | POA: Diagnosis not present

## 2023-07-23 DIAGNOSIS — Z87891 Personal history of nicotine dependence: Secondary | ICD-10-CM | POA: Diagnosis not present

## 2023-07-23 DIAGNOSIS — M47814 Spondylosis without myelopathy or radiculopathy, thoracic region: Secondary | ICD-10-CM | POA: Diagnosis not present

## 2023-07-23 LAB — POCT GLYCOSYLATED HEMOGLOBIN (HGB A1C): Hemoglobin A1C: 6.3 % — AB (ref 4.0–5.6)

## 2023-07-23 LAB — HM DIABETES FOOT EXAM

## 2023-07-23 MED ORDER — ALENDRONATE SODIUM 70 MG PO TABS: 70.0000 mg | ORAL_TABLET | ORAL | 11 refills | Status: DC

## 2023-07-23 NOTE — Assessment & Plan Note (Signed)
Chronic, very well-controlled with semaglutide 1 mg weekly and associated weight loss.  She is only using glipizide as needed 5 mg if her blood sugar is elevated prior to meal.

## 2023-07-23 NOTE — Assessment & Plan Note (Signed)
Acute focal tenderness to palpation over mid thoracic spine vertebrae as well as associated tenderness in left anterior chest wall. Most likely herniated disc, musculoskeletal strain versus atypical shingles without Micalizzi. No clear trauma suggesting fracture but given focal tenderness to palpation and osteoporosis we will check x-ray.  Also on differential is breast cancer metastases. She is currently using tramadol as needed for pain as well as muscle relaxant at night with some success.  She states she cannot take nonsteroidal anti-inflammatories given past allergy. We can consider prednisone if not improving as expected.  Return and ER precautions provided.

## 2023-07-23 NOTE — Progress Notes (Signed)
Patient ID: April Ross, female    DOB: April 25, 1951, 72 y.o.   MRN: [id number]  This visit was conducted in person.  BP 130/66 (BP Location: Left Arm, Patient Position: Sitting, Cuff Size: Normal)   Pulse 73   Temp (!) 97.1 F (36.2 C) (Temporal)   Ht 5\' 2"  (1.575 m)   Wt 141 lb 2 oz (64 kg)   SpO2 98%   BMI 25.81 kg/m    CC:  Chief Complaint  Patient presents with   Bone Density Results   Back Pain    Radiates to Left Breast    Subjective:   HPI: April Ross is a 72 y.o. female presenting on 07/23/2023 for Bone Density Results and Back Pain (Radiates to Left Breast)  Reviewed recent bone density from June 27, 2023 showing T-score of -2.6 showing significant change in hip compared to 2022, osteopenia in the spine and forearm.4  ON letrozole for bone density  Was on fosamax 2017 to 2022, now off.    Diabetes: Good control on semaglutide 1 mg weekly, and glipizide 5 mg twice daily before meals.. prn if CBGs high. Lab Results  Component Value Date   HGBA1C 6.3 (A) 07/23/2023  Using medications without difficulties: Hypoglycemic episodes: lows when was taking glpizide regularly. Hyperglycemic episodes: Feet problems: none Blood Sugars averaging: eye exam within last year:  Also new onset pain in back that radiates to left breast.  Started off and on for years in left shoulder bade.. worsened in last months.  Now in last few weeks radiating to left breast and in anterior chest wall. No proceeding fall or change in activity.  No skin changes.  No numbness or tingling.  Pressure on left back makes it hurt.  No SOB, no cough.  Allergic to tylenol and ibuprofen. Tramadol helps some with pain. Muscle relaxant also helps her sleep some. Wt Readings from Last 3 Encounters:  07/23/23 141 lb 2 oz (64 kg)  05/27/23 149 lb 4 oz (67.7 kg)  04/02/23 150 lb 2 oz (68.1 kg)     Relevant past medical, surgical, family and social history reviewed and updated as  indicated. Interim medical history since our last visit reviewed. Allergies and medications reviewed and updated. Outpatient Medications Prior to Visit  Medication Sig Dispense Refill   Alcohol Swabs (B-D SINGLE USE SWABS REGULAR) PADS Use to check blood sugar up to 2 times a day 100 each 5   amLODipine (NORVASC) 5 MG tablet TAKE 1 TABLET (5 MG TOTAL) BY MOUTH DAILY. 90 tablet 3   APPLE CIDER VINEGAR PO Take by mouth.     Blood Glucose Monitoring Suppl (ONETOUCH VERIO REFLECT) w/Device KIT Use to check blood sugar up to 2 times a day 1 kit 0   cetirizine (ZYRTEC) 10 MG tablet Take 10 mg by mouth at bedtime as needed for allergies.     CINNAMON PO Take 1 tablet by mouth daily.     Continuous Glucose Sensor (FREESTYLE LIBRE 2 SENSOR) MISC APPLY SENSOR EVERY 14 DAYS TO MONITOR SUGAR CONTINOUSLY 2 each 5   gabapentin (NEURONTIN) 100 MG capsule Take 100 mg by mouth 3 (three) times daily as needed.     glipiZIDE (GLUCOTROL) 5 MG tablet Take 1 tablet (5 mg total) by mouth 2 (two) times daily before a meal.     glucose blood (ONETOUCH VERIO) test strip Use to check blood sugar up to 2 times a day 100 each 5   Lancets (ONETOUCH  DELICA PLUS LANCET30G) MISC Use to check blood sugar up to 2 times a day 100 each 5   letrozole (FEMARA) 2.5 MG tablet TAKE 1 TABLET BY MOUTH EVERY DAY 90 tablet 3   levothyroxine (SYNTHROID) 88 MCG tablet TAKE 1 TABLET BY MOUTH EVERY DAY 90 tablet 3   losartan (COZAAR) 100 MG tablet TAKE 1 TABLET BY MOUTH EVERY DAY 90 tablet 3   NON FORMULARY Ginsing root     Omega-3 Fatty Acids (FISH OIL OMEGA-3 PO) Take 1 capsule by mouth daily.     OVER THE COUNTER MEDICATION Liver gallbladder support tablets     OVER THE COUNTER MEDICATION Leg and back pain relief tablets     PREBIOTIC PRODUCT PO Take by mouth.     rosuvastatin (CRESTOR) 20 MG tablet TAKE 1 TABLET BY MOUTH EVERY DAY 90 tablet 3   Semaglutide, 1 MG/DOSE, 4 MG/3ML SOPN Inject 1 mg as directed once a week. Patient Assistance.      tiZANidine (ZANAFLEX) 4 MG tablet TAKE 1 TABLET BY MOUTH AT BEDTIME AS NEEDED FOR MUSCLE SPASMS 90 tablet 0   traMADol (ULTRAM) 50 MG tablet TAKE 1 TABLET BY MOUTH EVERY DAY AS NEEDED 30 tablet 0   Turmeric (QC TUMERIC COMPLEX PO) Take 1 tablet by mouth daily.     vitamin B-12 (CYANOCOBALAMIN) 1000 MCG tablet Take 1,000 mcg by mouth daily.     VITAMIN D PO Take 2 each by mouth daily.     No facility-administered medications prior to visit.     Per HPI unless specifically indicated in ROS section below Review of Systems  Constitutional:  Negative for fatigue and fever.  HENT:  Negative for congestion.   Eyes:  Negative for pain.  Respiratory:  Negative for cough and shortness of breath.   Cardiovascular:  Negative for chest pain, palpitations and leg swelling.  Gastrointestinal:  Negative for abdominal pain.  Genitourinary:  Negative for dysuria and vaginal bleeding.  Musculoskeletal:  Negative for back pain.  Neurological:  Negative for syncope, light-headedness and headaches.  Psychiatric/Behavioral:  Negative for dysphoric mood.    Objective:  BP 130/66 (BP Location: Left Arm, Patient Position: Sitting, Cuff Size: Normal)   Pulse 73   Temp (!) 97.1 F (36.2 C) (Temporal)   Ht 5\' 2"  (1.575 m)   Wt 141 lb 2 oz (64 kg)   SpO2 98%   BMI 25.81 kg/m   Wt Readings from Last 3 Encounters:  07/23/23 141 lb 2 oz (64 kg)  05/27/23 149 lb 4 oz (67.7 kg)  04/02/23 150 lb 2 oz (68.1 kg)      Physical Exam Constitutional:      General: She is not in acute distress.    Appearance: Normal appearance. She is well-developed. She is not ill-appearing or toxic-appearing.  HENT:     Head: Normocephalic.     Right Ear: Hearing, tympanic membrane, ear canal and external ear normal. Tympanic membrane is not erythematous, retracted or bulging.     Left Ear: Hearing, tympanic membrane, ear canal and external ear normal. Tympanic membrane is not erythematous, retracted or bulging.     Nose:  No mucosal edema or rhinorrhea.     Right Sinus: No maxillary sinus tenderness or frontal sinus tenderness.     Left Sinus: No maxillary sinus tenderness or frontal sinus tenderness.     Mouth/Throat:     Mouth: Oropharynx is clear and moist and mucous membranes are normal.     Pharynx: Uvula  midline.  Eyes:     General: Lids are normal. Lids are everted, no foreign bodies appreciated.     Extraocular Movements: EOM normal.     Conjunctiva/sclera: Conjunctivae normal.     Pupils: Pupils are equal, round, and reactive to light.  Neck:     Thyroid: No thyroid mass or thyromegaly.     Vascular: No carotid bruit.     Trachea: Trachea normal.  Cardiovascular:     Rate and Rhythm: Normal rate and regular rhythm.     Pulses: Normal pulses.     Heart sounds: Normal heart sounds, S1 normal and S2 normal. No murmur heard.    No friction rub. No gallop.  Pulmonary:     Effort: Pulmonary effort is normal. No tachypnea or respiratory distress.     Breath sounds: Normal breath sounds. No decreased breath sounds, wheezing, rhonchi or rales.  Chest:     Chest wall: Tenderness present. No mass.  Breasts:    Left: No swelling, bleeding, mass or nipple discharge.       Comments: Marked area of tenderness to palpation, no left axillary lymphadenopathy, no supraclavicular  lymphadenopathy Abdominal:     General: Bowel sounds are normal.     Palpations: Abdomen is soft.     Tenderness: There is no abdominal tenderness.  Musculoskeletal:     Cervical back: Normal, normal range of motion and neck supple. No bony tenderness. Normal range of motion.     Thoracic back: Bony tenderness present. No spasms or tenderness. Normal range of motion.  Skin:    General: Skin is warm, dry and intact.     Findings: No [place].  Neurological:     Mental Status: She is alert.  Psychiatric:        Mood and Affect: Mood is not anxious or depressed.        Speech: Speech normal.        Behavior: Behavior normal.  Behavior is cooperative.        Thought Content: Thought content normal.        Cognition and Memory: Cognition and memory normal.        Judgment: Judgment normal.       Diabetic foot exam: Normal inspection No skin breakdown No calluses  Normal DP pulses Normal sensation to light touch and monofilament Nails normal  Results for orders placed or performed in visit on 07/23/23  POCT glycosylated hemoglobin (Hb A1C)  Result Value Ref Range   Hemoglobin A1C 6.3 (A) 4.0 - 5.6 %   HbA1c POC (<> result, manual entry)     HbA1c, POC (prediabetic range)     HbA1c, POC (controlled diabetic range)    HM DIABETES FOOT EXAM  Result Value Ref Range   HM Diabetic Foot Exam done     Assessment and Plan  Type 2 diabetes mellitus with nephropathy (HCC) Assessment & Plan: Chronic, very well-controlled with semaglutide 1 mg weekly and associated weight loss.  She is only using glipizide as needed 5 mg if her blood sugar is elevated prior to meal.    Orders: -     POCT glycosylated hemoglobin (Hb A1C)  Osteoporosis, unspecified osteoporosis type, unspecified pathological fracture presence Assessment & Plan: Chronic, on calcium and vitamin D.  She is currently on letrozole for breast cancer treatment.  She has approximately 3 more years left of this medication. In the past she was very responsive to Fosamax with improvement in her bone density.  We will restart  this (she was off for 2 years as instructed) and repeat bone density in 2 years. Also did discuss possibility of considering a medication like Prolia every 6 months to stimulate bone growth and she can discuss this with her oncologist as well.   Acute midline thoracic back pain Assessment & Plan: Acute focal tenderness to palpation over mid thoracic spine vertebrae as well as associated tenderness in left anterior chest wall. Most likely herniated disc, musculoskeletal strain versus atypical shingles without Visconti. No clear trauma  suggesting fracture but given focal tenderness to palpation and osteoporosis we will check x-ray.  Also on differential is breast cancer metastases. She is currently using tramadol as needed for pain as well as muscle relaxant at night with some success.  She states she cannot take nonsteroidal anti-inflammatories given past allergy. We can consider prednisone if not improving as expected.  Return and ER precautions provided.  Orders: -     DG Thoracic Spine W/Swimmers; Future  Anterior chest wall pain -     DG Ribs Unilateral Left; Future  Other orders -     Alendronate Sodium; Take 1 tablet (70 mg total) by mouth every 7 (seven) days. Take with a full glass of water on an empty stomach.  Dispense: 4 tablet; Refill: 11    No follow-ups on file.   Kerby Nora, MD

## 2023-07-23 NOTE — Assessment & Plan Note (Signed)
Chronic, on calcium and vitamin D.  She is currently on letrozole for breast cancer treatment.  She has approximately 3 more years left of this medication. In the past she was very responsive to Fosamax with improvement in her bone density.  We will restart this (she was off for 2 years as instructed) and repeat bone density in 2 years. Also did discuss possibility of considering a medication like Prolia every 6 months to stimulate bone growth and she can discuss this with her oncologist as well.

## 2023-07-25 ENCOUNTER — Encounter: Payer: Self-pay | Admitting: Pharmacist

## 2023-07-25 ENCOUNTER — Other Ambulatory Visit: Payer: PPO | Admitting: Pharmacist

## 2023-07-25 DIAGNOSIS — E1121 Type 2 diabetes mellitus with diabetic nephropathy: Secondary | ICD-10-CM

## 2023-07-25 NOTE — Patient Instructions (Addendum)
April Ross,   It was a pleasure to see you today! As we discussed:?   Continue Ozempic 1.0 mg under the skin once weekly Continue glipizide 5 mg before breakfast Stop glipizide evening dose  Please reach out if concern with blood sugars in the meantime. Goal is to reduce the number of low readings over night.   We discussed the following possible future considerations: Goal is to stop glipizide. This is the only medication you are taking that can cause blood sugars that are too low which can be dangerous.  We discussed possibly replacing glipizide with a safer drug called [place] or Jardiance. This type of medication does help to lower blood sugars, but is also indicated due to your kidney disease. We can start the application process for medication assistance.  We also discussed your cholesterol numbers. You LDL (bad cholesterol) is moderately controlled, though remains a bit above the goal of 70 mg/dL. We aim for this goal to reduce your risk of having a heart attack or stroke. After chatting with Dr. Ermalene Searing, I will reach out if a new prescription is sent in to your pharmacy for the rosuvastatin 40 mg tablet.    Regarding your supplement, inositol, there have been a handful of studies in patients with active/history of breast cancer, though the studies are small. In those studies, patients were using the supplement to reduce side effects of breast cancer medications, though it was deemed to be safe. Generally, it does seem that this supplement is likely safe to take, though since it has not been extensively researched. Consider running this by your oncologist to see if they have any opinions on the supplement.    Please reach out prior to your next scheduled appointment should you have any questions or concerns.  Thank you!   Future Appointments  Date Time Provider Department Center  08/12/2023  8:45 AM [name], MD [facility] None  08/19/2023  8:40 AM Alphonzo Cruise, PTA [facility] None  08/22/2023 11:00 AM [facility]CM PHARMACIST [facility]  09/19/2023  3:15 PM Paci, Daisey Must, MD [facility] None  12/23/2023  9:15 AM [facility] ANNUAL WELLNESS VISIT 1 [facility]    Berenice Primas, PharmD - Clinical Pharmacist

## 2023-07-25 NOTE — Progress Notes (Signed)
07/25/2023 Name: April Ross MRN: [id number] DOB: 1951-05-04  Subjective  No chief complaint on file.   Reason for visit: April Ross is a 72 y.o. year old female who presented for a telephone visit.   They were referred to the pharmacist by their PCP for assistance in managing diabetes.   Care Team: Primary Care Provider: Excell Seltzer, MD  Reason for visit: ?  April Ross is a 72 y.o. female with a history of diabetes (type 2), who presents today for a follow up diabetes pharmacotherapy visit.? Pertinent PMH also includes CKD, HTN, HLD, hypothyroidism, osteoporosis, breast cancer.  Known DM Complications: nephropathy    Date of Last Diabetes Related Visit: 05/06/23 with Pharmacist  Summary of last visit: ?  Increase Ozempic to 1 mg weekly, reduce glipizide to 5 mg twice daily to reduce risk of hypoglycemia. Recommend that next step of therapy be to pursue patient assistance for SGLT2 given CKD. Patient is amenable to both of these changes. Will discuss with PCP. ?  Patient increased glipizide back to 10 mg BID due to seeing higher sugars.  After patient completed current supply of Libre 2, will consider changing to [employer] 3 in case this is better compatible with her phone   Prescription drug coverage: Payor: [employer] / Plan: [employer] PPO / Product Type: *No Product type* / .   Reports that all medications are affordable.  Current Patient Assistance: NovoNordisk (Ozempic) Medication Adherence: Patient denies missing doses of their medication.    Since Last visit / History of Present Illness: ?  Patient reports implementing plan from last visit. Denies adverse effects with Ozempic 1 mg dose. Continues to use two 0.5 mg injections to use up supply. Has received x3 month supply of 1.0 mg pens.   Notes hypoglycemia when she takes glipizide 10 mg so she reduced back to 5 mg BID as previously instructed. Most recently, started skipping altogether if  pre-meal sugar <150 mg/dL as she notes hypoglycemia after her meal otherwise.    Reported DM Regimen: ?  Glipizide 5 mg twice daily (skips glipizide if pre-meal sugar <150 mg/dL) Ozempic 1 mg sq once weekly   DM medications tried in the past:?  metformin (patient preference to d/c) Trulicity (access concerns/backorder)  Overall, patient thinks that blood sugars are lower since last visit.   SMBG  Libre 2 (Linked) : ?     Hypo/Hyperglycemia: ?  Symptoms of hypoglycemia since last visit:? yes (a couple times per week, overnight. Sometimes midday) If yes, it was treated by:  chocolate piece Symptoms of hyperglycemia since last visit:? no - none  Exercise: None currently   DM Prevention:  Statin: Taking; high intensity.?  History of chronic kidney disease? yes History of albuminuria? yes, last UACR on 12/13/22 = 59.9 mg/g ACE/ARB - Taking losartan 100 mg; Urine MA/CR Ratio - elevated urinary albumin excretion.  Last eye exam: 12/24/22; No retinopathy present Last foot exam: 07/23/2023 Tobacco Use: Former  Immunizations:? Flu: Due (Last: 07/30/2022); Pneumococcal: PPSV23 (2010, 05/2016) PCV13 (02/2014); Shingrix: No Record - DUE; Zostavax (08/2012); Covid (07/08/20, 08/06/20; No record of Booster)  Cardiovascular Risk Reduction History of clinical ASCVD? no The 10-year ASCVD risk score (Arnett DK, et al., 2019) is: 46.6% Prevent: 10-year risk of CVD = 32.7%; This individual has an estimated 10-year risk of Heart Failure = 22.4% History of heart failure? no N/A History of hyperlipidemia? yes Current BMI: 25.8 kg/m2 (Ht 62 in, Wt 64 kg) Taking statin?  yes; high intensity (rosuvastatin 20 mg daily) Taking aspirin? not indicated; Not taking   Taking SGLT-2i? no Taking GLP- 1 RA? yes   Reported HTN Regimen: ?  Amlodipine 5 mg daily Losartan 100 mg daily  Patient takes their blood pressure medications in the morning Patient is not checking their blood pressure at home  regularly. Patient denies hypotensive s/sx. No dizziness, lightheadedness.  Patient denies hypertensive symptoms. No headache, chest pain, shortness of breath, visual changes.     _______________________________________________  Objective    Review of Systems:?  Constitutional:? No fever, chills or unintentional weight loss  Cardiovascular:? No chest pain or pressure, shortness of breath, dyspnea on exertion, orthopnea or LE edema  Pulmonary:? No cough or shortness of breath  GI:? No nausea, vomiting, constipation, diarrhea, abdominal pain, dyspepsia, change in bowel habits  Endocrine:? No polyuria, polyphagia or blurred vision  Psych:? No depression, anxiety, insomnia    Physical Examination:  Vitals:  Wt Readings from Last 3 Encounters:  07/23/23 141 lb 2 oz (64 kg)  05/27/23 149 lb 4 oz (67.7 kg)  04/02/23 150 lb 2 oz (68.1 kg)   BP Readings from Last 3 Encounters:  07/23/23 130/66  06/20/23 137/70  04/02/23 130/60   Pulse Readings from Last 3 Encounters:  07/23/23 73  06/20/23 68  04/02/23 62     Labs:?  Lab Results  Component Value Date   HGBA1C 6.3 (A) 07/23/2023   HGBA1C 6.8 (A) 04/02/2023   HGBA1C 7.4 (H) 12/13/2022   GLUCOSE 194 (H) 12/13/2022   MICRALBCREAT 59.9 (H) 12/13/2022   MICRALBCREAT 4.0 11/19/2017   CREATININE 1.23 (H) 12/13/2022   CREATININE 1.30 (H) 10/20/2022   CREATININE 1.29 (H) 06/14/2022   GFR 43.51 (L) 12/13/2022   GFR 41.23 (L) 06/14/2022   GFR 39.18 (L) 12/08/2021    Lab Results  Component Value Date   CHOL 169 12/13/2022   LDLCALC 61 10/28/2017   LDLCALC 85 05/09/2017   LDLDIRECT 95.0 12/13/2022   HDL 31.90 (L) 12/13/2022   HDL 30.10 (L) 06/14/2022   HDL 30.80 (L) 12/08/2021   AST 21 12/13/2022   AST 20 06/14/2022   ALT 29 12/13/2022   ALT 27 06/14/2022      Chemistry      Component Value Date/Time   NA 141 12/13/2022 0728   K 4.2 12/13/2022 0728   CL 106 12/13/2022 0728   CO2 26 12/13/2022 0728   BUN 30 (H)  12/13/2022 0728   CREATININE 1.23 (H) 12/13/2022 0728      Component Value Date/Time   CALCIUM 9.7 12/13/2022 0728   ALKPHOS 45 12/13/2022 0728   AST 21 12/13/2022 0728   ALT 29 12/13/2022 0728   BILITOT 0.5 12/13/2022 0728      Assessment and Plan:   1. Diabetes, type 2: controlled per last A1c of 6.3% (07/23/23), decreased from previous 6.8% (04/02/23) with goal <7% without hypoglycemia. CGM data consistent with A1c with ongoing concern for intermittent hypoglycemia overnight. Given great glycemic control and overnight lows, we discussed stopping glipizide evening dose. Ideally will discontinue glipizide altogether at follow up visit with optimization of other therapies. Discussed SGLT2i today as future option which patient is open to if through MAP. Will discuss with provider and initiate MAP application to determine eligibility (if approved will plan to stop glipizide, start SGLT2i via phone follow up).  Current Regimen: glipizide 5 mg BID, Ozempic 1 mg sq weekly Continue Ozempic 1 mg sq weekly Decrease glipizide to 5 mg before  breakfast only SMBG: Libre 2. Consider Libre 3 once patient needs refills Reviewed signs/symptoms/treatment of hypoglycemia  Next A1c due 3-6 months given good control Future Consideration: Novo re-enrollment 2025 SGLT2i: Ideal at this time for CKD with microalbuminuria. Cost previously a barrier, though will likely qualify for MAP. GLP1-RA: Could consider further titration to 2 mg dose with main goal of stopping glipizide altogether Metformin: Took previously >20 years and was patient preference to discontinue due to fears seen online.  TZD: Avoiding due to possible increase in fracture risk and minimal A1c-lowering    2. CKD: Tolerating ARB well without concerns. Last UACR elevated at 59.9 mg/g (12/13/22), worsened from 4.0 mg/g in 2019 despite max dose of losartan. Patient is indicated for SGLT2i for prevention of CKD/albuminuria progression. Will also befit  glycemic control (as above). Will discuss with PCP.  Patient is eligible for Farxiga MAP (AZ&Me) Jardiance MAP ([employer]) has stricter requirements, though will evaluate patient's eligibility  Future consideration: If approved, start Farxiga or Jardiance at CKD doseing: 10 mg once daily in the morning.  BMP ~4 weeks after SGLT2i initiation.    3. HTN: controlled based on last clinic BP of 130/66 mmHg (07/23/23), goal <130/80 mmHg. Does not monitor BP at home though has cuff if needed. Denies lightheadedness, dizziness, SOB, CP, vision changes.  Current Regimen: losartan 100 mg daily, amlodipine 5 mg daily  Continue medications without changes.    4. ASCVD (primary prevention): LDL  above goal  on last lipid panel with LDL 95 mg/dL, TG 829 mh/dL (02/11/20). LDL goal <70 mg/dL (primary prevention, diabetes). Given significantly elevated risk scores (below) will discuss possible statin increase with PCP. Patient amenable to dose increase.  Key risk factors include: diabetes, hypertension, hyperlipidemia, former smoker, and sedentary lifestyle The 10-year ASCVD risk score (Arnett DK, et al., 2019) is: 46.6% indicated patient is at very high risk.  PREVENT: 10-year risk of CVD = 32.7%; This individual has an estimated 10-year risk of Heart Failure = 22.4%  Current Regimen: rosuvastatin 20 mg daily Consider increase rosuvastatin to 40 mg daily   5. Healthcare Maintenance:  Pneumococcal - Current status: Up to date (last received PPSV23 2017 after age 67)  Shingles - Current status: DUE (no record Shingrix in EMR) Influenza - Current status: DUE  Due to receive the following vaccines: Influenza, Shingrix, and Covid Booster   Follow Up Follow up with clinical pharmacist via phone in 4 weeks.  ?   Future Appointments  Date Time Provider Department Center  08/12/2023  8:45 AM [name], MD [facility] None  08/19/2023  8:40 AM Alphonzo Cruise, PTA [facility] None  08/22/2023 11:00 AM  [facility] PHARMACIST [facility]  09/19/2023  3:15 PM Paci, Daisey Must, MD [facility] None  12/23/2023  9:15 AM [facility] ANNUAL WELLNESS VISIT 1 [facility]   Loree Fee, PharmD Clinical Pharmacist [facility] [phone]

## 2023-08-12 ENCOUNTER — Inpatient Hospital Stay: Payer: PPO | Attending: Hematology and Oncology | Admitting: Hematology and Oncology

## 2023-08-12 VITALS — BP 141/66 | HR 75 | Temp 97.9°F | Resp 18 | Ht 62.0 in | Wt 139.8 lb

## 2023-08-12 DIAGNOSIS — C50211 Malignant neoplasm of upper-inner quadrant of right female breast: Secondary | ICD-10-CM | POA: Diagnosis not present

## 2023-08-12 DIAGNOSIS — Z923 Personal history of irradiation: Secondary | ICD-10-CM | POA: Insufficient documentation

## 2023-08-12 DIAGNOSIS — Z170 Estrogen receptor positive status [ER+]: Secondary | ICD-10-CM | POA: Diagnosis not present

## 2023-08-12 DIAGNOSIS — Z7984 Long term (current) use of oral hypoglycemic drugs: Secondary | ICD-10-CM | POA: Insufficient documentation

## 2023-08-12 DIAGNOSIS — M7918 Myalgia, other site: Secondary | ICD-10-CM | POA: Insufficient documentation

## 2023-08-12 DIAGNOSIS — R232 Flushing: Secondary | ICD-10-CM | POA: Diagnosis not present

## 2023-08-12 DIAGNOSIS — Z79899 Other long term (current) drug therapy: Secondary | ICD-10-CM | POA: Diagnosis not present

## 2023-08-12 DIAGNOSIS — M810 Age-related osteoporosis without current pathological fracture: Secondary | ICD-10-CM | POA: Diagnosis not present

## 2023-08-12 DIAGNOSIS — Z79811 Long term (current) use of aromatase inhibitors: Secondary | ICD-10-CM | POA: Insufficient documentation

## 2023-08-12 NOTE — Progress Notes (Signed)
Patient Care Team: Excell Seltzer, MD as PCP - General (Family Medicine) Sharman Cheek, DMD as Consulting Physician (Dentistry) [name], MD as Consulting Physician (Hematology and Oncology) Kathyrn Sheriff, [facility] (Inactive) as Pharmacist (Pharmacist) Griselda Miner, MD as Consulting Physician (General Surgery) Dorothy Puffer, MD as Consulting Physician (Radiation Oncology)  DIAGNOSIS:  Encounter Diagnosis  Name Primary?   Carcinoma of upper-inner quadrant of right breast in female, estrogen receptor positive (HCC) Yes    SUMMARY OF ONCOLOGIC HISTORY: Oncology History  Carcinoma of upper-inner quadrant of right breast in female, estrogen receptor positive (HCC)  06/01/2021 Initial Diagnosis   Screening mammogram detected right breast cancer, biopsy invasive lobular cancer with LCIS grade 2, ER 90%, PR 10%, Ki-67 10%, HER2 negative ratio 1.15   07/24/2021 Surgery   Right lumpectomy: Grade 1 invasive lobular cancer 2.1 cm, margins negative, 2/11 lymph nodes positive, ER 90%, PR 10%, HER2 equivocal by IHC negative by FISH ratio 1.15, Ki-67 10% (Dr. Carolynne Edouard: Surgeon)   07/24/2021 Cancer Staging   Staging form: Breast, AJCC 8th Edition - Pathologic stage from 07/24/2021: Stage IA (pT2, pN1a, cM0, G1, ER+, PR+, HER2-) - Signed by Loa Socks, NP on 02/05/2022 Stage prefix: Initial diagnosis Histologic grading system: 3 grade system   09/20/2021 Oncotype testing   Oncotype DX recurrence score: 14: Distant recurrence at 9 years: 14%   09/28/2021 - 11/14/2021 Radiation Therapy   Site Technique Total Dose (Gy) Dose per Fx (Gy) Completed Fx Beam Energies  Breast, Right: Breast_R 3D 50.4/50.4 1.8 28/28 10XFFF  Breast, Right: Breast_R_SCLV 3D 50.4/50.4 1.8 28/28 6X, 10X  Breast, Right: Breast_R_Bst 3D 10/10 2 5/5 6X, 10X      11/06/2021 Cancer Staging   Staging form: Breast, AJCC 8th Edition - Clinical: Stage IIA (cT2, cN1, cM0, G1, ER+, PR+, HER2-) - Signed by [name], MD on 11/06/2021 Stage prefix: Initial diagnosis Histologic grading system: 3 grade system   11/2021 -  Anti-estrogen oral therapy   Letrozole daily     CHIEF COMPLIANT: Follow-up on letrozole therapy  HISTORY OF PRESENT ILLNESS: Discussed the use of AI scribe software for clinical note transcription with the patient, who gave verbal consent to proceed.  History of Present Illness   The patient, with a history of breast cancer, presents with fatigue and shoulder pain. She has been on anti-estrogen therapy for almost two years and reports some fatigue, which she attributes to the medication. She also reports occasional hot flashes. She has been taking gabapentin for pain related to her surgical site, which has improved significantly since the surgery. However, she has been experiencing tenderness and pain across her right shoulder blade and the top of her right breast since mid-September. The pain has been improving. She also reports a weight loss of about ten pounds in the last couple of months after starting Ozempic.         ALLERGIES:  is allergic to acetaminophen, asa [aspirin], ibuprofen, and other.  MEDICATIONS:  Current Outpatient Medications  Medication Sig Dispense Refill   Alcohol Swabs (B-D SINGLE USE SWABS REGULAR) PADS Use to check blood sugar up to 2 times a day 100 each 5   alendronate (FOSAMAX) 70 MG tablet Take 1 tablet (70 mg total) by mouth every 7 (seven) days. Take with a full glass of water on an empty stomach. 4 tablet 11   amLODipine (NORVASC) 5 MG tablet TAKE 1 TABLET (5 MG TOTAL) BY MOUTH DAILY. 90 tablet 3   APPLE  CIDER VINEGAR PO Take by mouth.     Blood Glucose Monitoring Suppl (ONETOUCH VERIO REFLECT) w/Device KIT Use to check blood sugar up to 2 times a day 1 kit 0   cetirizine (ZYRTEC) 10 MG tablet Take 10 mg by mouth at bedtime as needed for allergies.     CINNAMON PO Take 1 tablet by mouth daily.     Continuous Glucose Sensor (FREESTYLE LIBRE 2  SENSOR) MISC APPLY SENSOR EVERY 14 DAYS TO MONITOR SUGAR CONTINOUSLY 2 each 5   gabapentin (NEURONTIN) 100 MG capsule Take 100 mg by mouth 3 (three) times daily as needed.     glipiZIDE (GLUCOTROL) 5 MG tablet Take 1 tablet (5 mg total) by mouth 2 (two) times daily before a meal.     glucose blood (ONETOUCH VERIO) test strip Use to check blood sugar up to 2 times a day 100 each 5   Lancets (ONETOUCH DELICA PLUS LANCET30G) MISC Use to check blood sugar up to 2 times a day 100 each 5   letrozole (FEMARA) 2.5 MG tablet TAKE 1 TABLET BY MOUTH EVERY DAY 90 tablet 3   levothyroxine (SYNTHROID) 88 MCG tablet TAKE 1 TABLET BY MOUTH EVERY DAY 90 tablet 3   losartan (COZAAR) 100 MG tablet TAKE 1 TABLET BY MOUTH EVERY DAY 90 tablet 3   NON FORMULARY Ginsing root     Omega-3 Fatty Acids (FISH OIL OMEGA-3 PO) Take 1 capsule by mouth daily.     OVER THE COUNTER MEDICATION Liver gallbladder support tablets     OVER THE COUNTER MEDICATION Leg and back pain relief tablets     PREBIOTIC PRODUCT PO Take by mouth.     rosuvastatin (CRESTOR) 20 MG tablet TAKE 1 TABLET BY MOUTH EVERY DAY 90 tablet 3   Semaglutide, 1 MG/DOSE, 4 MG/3ML SOPN Inject 1 mg as directed once a week. Patient Assistance.     tiZANidine (ZANAFLEX) 4 MG tablet TAKE 1 TABLET BY MOUTH AT BEDTIME AS NEEDED FOR MUSCLE SPASMS 90 tablet 0   traMADol (ULTRAM) 50 MG tablet TAKE 1 TABLET BY MOUTH EVERY DAY AS NEEDED 30 tablet 0   Turmeric (QC TUMERIC COMPLEX PO) Take 1 tablet by mouth daily.     vitamin B-12 (CYANOCOBALAMIN) 1000 MCG tablet Take 1,000 mcg by mouth daily.     VITAMIN D PO Take 2 each by mouth daily.     No current facility-administered medications for this visit.    PHYSICAL EXAMINATION: ECOG PERFORMANCE STATUS: 1 - Symptomatic but completely ambulatory  Vitals:   08/12/23 1014  BP: (!) 141/66  Pulse: 75  Resp: 18  Temp: 97.9 F (36.6 C)  SpO2: 99%   Filed Weights   08/12/23 1014  Weight: 139 lb 12.8 oz (63.4 kg)       LABORATORY DATA:  I have reviewed the data as listed    Latest Ref Rng & Units 12/13/2022    7:28 AM 10/20/2022    7:07 AM 06/14/2022    7:29 AM  CMP  Glucose 70 - 99 mg/dL 696  295  284   BUN 6 - 23 mg/dL 30  32  26   Creatinine 0.40 - 1.20 mg/dL 1.32  4.40  1.02   Sodium 135 - 145 mEq/L 141  135  140   Potassium 3.5 - 5.1 mEq/L 4.2  3.8  4.1   Chloride 96 - 112 mEq/L 106  103  105   CO2 19 - 32 mEq/L 26  22  25   Calcium 8.4 - 10.5 mg/dL 9.7  9.7  9.5   Total Protein 6.0 - 8.3 g/dL 6.8   7.2   Total Bilirubin 0.2 - 1.2 mg/dL 0.5   0.5   Alkaline Phos 39 - 117 U/L 45   48   AST 0 - 37 U/L 21   20   ALT 0 - 35 U/L 29   27     Lab Results  Component Value Date   WBC 15.0 (H) 10/20/2022   HGB 13.8 10/20/2022   HCT 40.6 10/20/2022   MCV 84.9 10/20/2022   PLT 224 10/20/2022   NEUTROABS 11.9 (H) 10/20/2022    ASSESSMENT & PLAN:  Carcinoma of upper-inner quadrant of right breast in female, estrogen receptor positive (HCC) 06/01/2021: Screening mammogram detected right breast cancer invasive lobular cancer with LCIS grade 2 ER/PR positive HER2 negative with a Ki-67 10% 07/24/2021: Right lumpectomy: Grade 1 invasive lobular cancer 2.1 cm, margins negative, 2/11 lymph nodes positive, ER 90%, PR 10%, HER2 equivocal by IHC negative by FISH ratio 1.15, Ki-67 10% (Dr. Carolynne Edouard: Surgeon) 09/08/2021-11/14/2021: Adjuvant radiation   Treatment plan: Adjuvant antiestrogen therapy with letrozole 2.5 mg daily x7 years started 11/28/2021    Letrozole toxicities: Denies any major adverse effects Patient lost 10 pounds on Ozempic and is doing quite well.  She feels so much better.   Breast cancer surveillance: Breast exam: 08/12/2023: Benign Mammogram 06/27/2023: Benign breast density category C   Return to clinic in 1 year for follow-up ------------------------------------- Assessment and Plan    Breast Cancer - Post-Surgical No pain or discomfort at the surgical site. Successful carpal tunnel  surgery. On anti-estrogen therapy for almost two years with fatigue as a side effect. -Continue current anti-estrogen therapy. Reevaluate at the five-year mark.  Musculoskeletal Pain Pain and tenderness across the shoulder blade and top of the breast. X-rays showed no abnormalities. Pain has been improving. -Continue current management. If pain does not continue to improve, consider further imaging.  Osteoporosis Recent bone density scan showed progression from osteopenia to osteoporosis. Recently restarted on Fosamax. -Continue Fosamax.  General Health Maintenance -Continue annual mammograms. -Consider starting Inositol for hormone balance after confirming it is not an estrogen replacement. -Continue Ozempic for weight loss. -Schedule follow-up appointment for next year.        No orders of the defined types were placed in this encounter.  The patient has a good understanding of the overall plan. she agrees with it. she will call with any problems that may develop before the next visit here. Total time spent: 30 mins including face to face time and time spent for planning, charting and co-ordination of care   Tamsen Meek, MD 08/12/23

## 2023-08-12 NOTE — Assessment & Plan Note (Addendum)
06/01/2021: Screening mammogram detected right breast cancer invasive lobular cancer with LCIS grade 2 ER/PR positive HER2 negative with a Ki-67 10% 07/24/2021: Right lumpectomy: Grade 1 invasive lobular cancer 2.1 cm, margins negative, 2/11 lymph nodes positive, ER 90%, PR 10%, HER2 equivocal by IHC negative by FISH ratio 1.15, Ki-67 10% (Dr. Carolynne Edouard: Surgeon) 09/08/2021-11/14/2021: Adjuvant radiation   Treatment plan: Adjuvant antiestrogen therapy with letrozole 2.5 mg daily x7 years started 11/28/2021    Letrozole toxicities: Denies any major adverse effects Patient lost 10 pounds on Ozempic and is doing quite well.  She feels so much better.   Breast cancer surveillance: Breast exam: 08/12/2023: Benign Mammogram 06/27/2023: Benign breast density category C   Return to clinic in 1 year for follow-up

## 2023-08-19 ENCOUNTER — Ambulatory Visit: Payer: PPO | Attending: General Surgery

## 2023-08-19 VITALS — Wt 138.0 lb

## 2023-08-19 DIAGNOSIS — Z483 Aftercare following surgery for neoplasm: Secondary | ICD-10-CM | POA: Insufficient documentation

## 2023-08-19 NOTE — Therapy (Signed)
OUTPATIENT PHYSICAL THERAPY SOZO SCREENING NOTE   Patient Name: April Ross MRN: [id number] DOB:Oct 14, 1950, 72 y.o., female Today's Date: 08/19/2023  PCP: Excell Seltzer, MD REFERRING PROVIDER: Griselda Miner, MD   PT End of Session - 08/19/23 [phone]     Visit Number 19   # unchanged due to screen only   PT Start Time 0830    PT Stop Time 0834    PT Time Calculation (min) 4 min    Activity Tolerance Patient tolerated treatment well    Behavior During Therapy [facility] for tasks assessed/performed             Past Medical History:  Diagnosis Date   Allergy    Arthritis    Breast cancer (HCC)    right side 07/2021   Cancer (HCC)    CRD (chronic renal disease)    stage 3   Depression    Diabetes mellitus without complication (HCC)    GERD (gastroesophageal reflux disease)    Hyperlipidemia    Hypertension    LBBB (left bundle branch block)    a. diagnosed in 10/2017 - echo showed a preserved EF of 55-60% and NST was low-risk showing no evidence of ischemia.    Osteoporosis    polyps in colon    Thyroid disease    Urinary tract infection    Past Surgical History:  Procedure Laterality Date   ABDOMINAL HYSTERECTOMY     APPENDECTOMY     BREAST BIOPSY Right    BREAST LUMPECTOMY Right 07/2021   BREAST LUMPECTOMY WITH RADIOACTIVE SEED AND SENTINEL LYMPH NODE BIOPSY Right 07/24/2021   Procedure: RIGHT BREAST LUMPECTOMY WITH RADIOACTIVE SEED AND SENTINEL LYMPH NODE BIOPSY;  Surgeon: Griselda Miner, MD;  Location: [facility];  Service: General;  Laterality: Right;   CARPAL TUNNEL RELEASE Right 11/14/2022   Procedure: CARPAL TUNNEL RELEASE;  Surgeon: Marlyne Beards, MD;  Location: [facility];  Service: Orthopedics;  Laterality: Right;  or MAC with local   Patient Active Problem List   Diagnosis Date Noted   Anterior chest wall pain 07/23/2023   Microalbuminuria 04/02/2023   Skin lesion of back 04/02/2023   Chronic tension-type headache, not  intractable 07/25/2021   Carcinoma of upper-inner quadrant of right breast in female, estrogen receptor positive (HCC) 06/15/2021   Diabetic retinopathy (HCC) 11/24/2020   Acute midline thoracic back pain 11/21/2018   Type 2 diabetes mellitus with nephropathy (HCC) 10/27/2017   Osteoporosis 05/24/2016   CKD (chronic kidney disease) stage 3, GFR 30-59 ml/min (HCC) 05/08/2016   Hyperlipidemia associated with type 2 diabetes mellitus (HCC) 01/12/2016   Hypothyroidism 01/12/2016   Hypertension associated with diabetes (HCC) 01/12/2016   Allergic rhinitis 01/12/2016   Moderate major depression, single episode (HCC) 01/12/2016   Chronic insomnia 01/12/2016   GERD (gastroesophageal reflux disease) 01/12/2016    REFERRING DIAG: right breast cancer at risk for lymphedema  THERAPY DIAG: Aftercare following surgery for neoplasm  PERTINENT HISTORY: Pt will be had a Rt lumpectomy with 2/11 lymph nodes positive on 07/24/21.Had infection and drainage just proximal to incision site during radiatio Done with radiation 2/7/20923 . No chemotherapy and now on antiestrogen.   Pt has an increase in SOZO and so is now wearing a sleeve for 4 weeks  other history includes: DM, stage 3 CKD, osteoporosis  PRECAUTIONS: right UE Lymphedema risk, None  SUBJECTIVE: Pt returns for her L-Dex screen.   PAIN:  Are you having pain? No  SOZO  SCREENING:Patient was assessed today using the SOZO machine to determine the lymphedema index score. This was compared to her baseline score. It was determined that she is NOT within the recommended range when compared to her baseline. She has already resumed wearing her compression sleeve for day and night and her change from baseline was only slightly elevated above 6.5 to 7.5 and still being in yellow. So decided to cont 3 month monitoring with L-Dex as pt already has been treated by this clinic, is doing well with her maintenance phase of treatment since surgery and pt was agreeable  to this and knows to call us sooner if needed.     L-DEX FLOWSHEETS - 08/19/23 0800       L-DEX LYMPHEDEMA SCREENING   Measurement Type Unilateral    L-DEX MEASUREMENT EXTREMITY Upper Extremity    POSITION  Standing    DOMINANT SIDE Right    At Risk Side Right    BASELINE SCORE (UNILATERAL) -5.9    L-DEX SCORE (UNILATERAL) -2.1    VALUE CHANGE (UNILAT) 3.8            P: Pt to have 1 more 3 month SOZO despite being at her 2 years from surgery with her history of lymphedema.    Hermenia Bers, PTA 08/19/2023, 8:35 AM

## 2023-08-20 ENCOUNTER — Telehealth: Payer: Self-pay | Admitting: Family Medicine

## 2023-08-20 NOTE — Telephone Encounter (Signed)
Received from [employer] PAP:  Ozempic 4mg /3 ml pen x 4.  Lot [id number] Exp: 04/06/2026.  Ms. Trier notified medication is here at the office to be picked up.

## 2023-08-22 ENCOUNTER — Other Ambulatory Visit: Payer: PPO | Admitting: Pharmacist

## 2023-08-22 DIAGNOSIS — E1121 Type 2 diabetes mellitus with diabetic nephropathy: Secondary | ICD-10-CM

## 2023-08-22 MED ORDER — FREESTYLE LIBRE 3 PLUS SENSOR MISC: 11 refills | Status: DC

## 2023-08-22 NOTE — Patient Instructions (Signed)
Ms. April Ross,   It was a pleasure to see you today! As we discussed:?   Continue Ozempic 1.0 mg under the skin once weekly Stop glipizide  We discussed the following possible future considerations:  We discussed [name] again. I will plan to follow up on what still needs to be completed for the application to [place] and [place].  This type of medication does help to lower blood sugars, but is also indicated due to your kidney disease. It does not cause blood sugars to drop too low like your glipizide did.   Last visit we discussed your cholesterol numbers. Your LDL (bad cholesterol) is moderately controlled, though remains a bit above the goal of 70 mg/dL. We aim for this goal to reduce your risk of having a heart attack or stroke. We discussed the possibility of a new prescription for rosuvastatin 40 mg tablet. We can also wait to see what your cholesterol numbers show on your next lab draw (due for repeat ~March).   Please reach out prior to your next scheduled appointment should you have any questions or concerns.  Thank you!   Future Appointments  Date Time Provider Department Center  09/18/2023 11:00 AM [facility] PHARMACIST [facility]  09/19/2023  3:15 PM Gwenith Daily, MD [facility] None  11/18/2023  8:50 AM Alphonzo Cruise, PTA [facility] None  12/23/2023  9:15 AM [facility] ANNUAL WELLNESS VISIT 1 [facility]  08/11/2024 11:15 AM [name], MD [facility] None    Berenice Primas, PharmD - Clinical Pharmacist

## 2023-08-22 NOTE — Progress Notes (Signed)
08/22/2023 Name: April Ross MRN: [id number] DOB: [birth year]  Subjective  Chief Complaint  Patient presents with   Diabetes   Hyperlipidemia   Hypertension    Reason for visit: April Ross is a 72 y.o. year old female who presented for a telephone visit.   They were referred to the pharmacist by their PCP for assistance in managing diabetes.   Care Team: Primary Care Provider: Excell Seltzer, MD  Reason for visit: ?  April Ross is a 72 y.o. female with a history of diabetes (type 2), who presents today for a follow up diabetes pharmacotherapy visit.? Pertinent PMH also includes CKD, HTN, HLD, hypothyroidism, osteoporosis, breast cancer.  Known DM Complications: nephropathy    Date of Last Diabetes Related Visit: 07/25/23 with Pharmacist  Summary of recent visits: ?  10/17: Ongoing c/f overnight lows w A1c 6.3%. Continue Ozempic 1 mg, ?? glipizide to 5 mg once daily (breakfast) 7/29: Pt self-?? glipizide back to 10 mg BID due to seeing higher sugars. Plan: ?? Ozempic to 1 mg weekly, ?? glipizide to 5 mg BID. @f /u consider MAP for SGLT2 given CKD. Patient is amenable. Consider Truitt Merle ?Libre 3.  Prescription drug coverage: Payor: [employer] / Plan: [employer] PPO / Product Type: *No Product type* / .   Reports that all medications are affordable.  Current Patient Assistance: NovoNordisk (Ozempic) Medication Adherence: Patient denies missing doses of their medication.    Since Last visit / History of Present Illness: ?  Patient reports implementing plan from last visit. Denies adverse effects with Ozempic 1 mg dose, aware shipment/refill has been delivered to clinic.   Confirms stopping evening glipizide dose. Has been using morning glipizide with breakfast only if pre-breakfast sugar >150 mg/dL. On days she took glipizide for higher sugars, she reports dropping low in the early afternoon.   Reported DM Regimen: ?  Ozempic 1 mg sq once  weekly Glipizide 5 mg once daily before breakfast (if BG>150 mg/dL)   DM medications tried in the past:?  metformin (patient preference to d/c) Trulicity (access concerns/backorder)  SMBG  Libre 2 (Linked) : ?  CGM Report (Date Range 11/1 - 11/14) ABG 147 mg/dL; GMI 1.3%; Variability 23.1%; TIR 85%, high 15%, very high 0%, low 0%, very low 0%     Hypo/Hyperglycemia: ?  Symptoms of hypoglycemia since last visit:? yes (a couple times per week, several hours after breakfast on days she took am glipizide).  If yes, it was treated by:  chocolate piece Symptoms of hyperglycemia since last visit:? no - none  Exercise: None currently   DM Prevention:  Statin: Taking; high intensity.? History of chronic kidney disease? yes History of albuminuria? yes, last UACR on 12/13/22 = 59.9 mg/g ACE/ARB - Taking losartan 100 mg; Urine MA/CR Ratio - elevated urinary albumin excretion.  Last eye exam: 12/24/22; No retinopathy present Last foot exam: 07/23/2023 Tobacco Use: Former  Immunizations:? Flu: Due (Last: 07/30/2022); Pneumococcal: PPSV23 (2010, 05/2016) PCV13 (02/2014); Shingrix: No Record - DUE; Zostavax (08/2012); Covid (07/08/20, 08/06/20; No record of Booster)  Cardiovascular Risk Reduction History of clinical ASCVD? no The 10-year ASCVD risk score (Arnett DK, et al., 2019) is: 52.3% Prevent: 10-year risk of CVD = 32.7%; This individual has an estimated 10-year risk of Heart Failure = 22.4% History of heart failure? no N/A History of hyperlipidemia? yes Current BMI: 25.8 kg/m2 (Ht 62 in, Wt 64 kg) Taking statin? yes; high intensity (rosuvastatin 20 mg daily) Taking aspirin? not indicated;  Not taking   Taking SGLT-2i? no Taking GLP- 1 RA? yes   Reported HTN Regimen: ?  Amlodipine 5 mg daily Losartan 100 mg daily  Patient takes their blood pressure medications in the morning Patient is not checking their blood pressure at home regularly. Patient denies hypotensive s/sx. No dizziness,  lightheadedness.  Patient denies hypertensive symptoms. No headache, chest pain, shortness of breath, visual changes.    _______________________________________________  Objective    Review of Systems:?  Constitutional:? No fever, chills or unintentional weight loss  Cardiovascular:? No chest pain or pressure, shortness of breath, dyspnea on exertion, orthopnea or LE edema  Pulmonary:? No cough or shortness of breath  GI:? No nausea, vomiting, constipation, diarrhea, abdominal pain, dyspepsia, change in bowel habits  Endocrine:? No polyuria, polyphagia or blurred vision  Psych:? No depression, anxiety, insomnia  Physical Examination:  Vitals:  Wt Readings from Last 3 Encounters:  08/19/23 138 lb (62.6 kg)  08/12/23 139 lb 12.8 oz (63.4 kg)  07/23/23 141 lb 2 oz (64 kg)   BP Readings from Last 3 Encounters:  08/12/23 (!) 141/66  07/23/23 130/66  06/20/23 137/70   Pulse Readings from Last 3 Encounters:  08/12/23 75  07/23/23 73  06/20/23 68    Labs:?  Lab Results  Component Value Date   HGBA1C 6.3 (A) 07/23/2023   HGBA1C 6.8 (A) 04/02/2023   HGBA1C 7.4 (H) 12/13/2022   GLUCOSE 194 (H) 12/13/2022   MICRALBCREAT 59.9 (H) 12/13/2022   MICRALBCREAT 4.0 11/19/2017   CREATININE 1.23 (H) 12/13/2022   CREATININE 1.30 (H) 10/20/2022   CREATININE 1.29 (H) 06/14/2022   GFR 43.51 (L) 12/13/2022   GFR 41.23 (L) 06/14/2022   GFR 39.18 (L) 12/08/2021   Lab Results  Component Value Date   CHOL 169 12/13/2022   LDLCALC 61 10/28/2017   LDLCALC 85 05/09/2017   LDLDIRECT 95.0 12/13/2022   HDL 31.90 (L) 12/13/2022   HDL 30.10 (L) 06/14/2022   HDL 30.80 (L) 12/08/2021   AST 21 12/13/2022   AST 20 06/14/2022   ALT 29 12/13/2022   ALT 27 06/14/2022     Chemistry      Component Value Date/Time   NA 141 12/13/2022 0728   K 4.2 12/13/2022 0728   CL 106 12/13/2022 0728   CO2 26 12/13/2022 0728   BUN 30 (H) 12/13/2022 0728   CREATININE 1.23 (H) 12/13/2022 0728      Component  Value Date/Time   CALCIUM 9.7 12/13/2022 0728   ALKPHOS 45 12/13/2022 0728   AST 21 12/13/2022 0728   ALT 29 12/13/2022 0728   BILITOT 0.5 12/13/2022 0728     Assessment and Plan:   1. Diabetes, type 2: controlled per last A1c of 6.3% (07/23/23), decreased from previous 6.8% (04/02/23) with goal <7% without hypoglycemia. Major concern at last visit was intermittent hypoglycemia overnight. Since stopping evening dose of glipizide, avg BG has increased slightly though remains very well controlled (ABG 147, TIR 85%, Above 15%) with no nighttime lows. Does report early afternoon lows on days she uses her AM glipizide and is agreeable to stopping altogether. Previously discussed SGLT2i which patient reports she is interested in starting if able. Farxiga 10 mg application started for [place].   Current Regimen: glipizide 5 mg qAM, Ozempic 1 mg sq weekly Continue Ozempic 1 mg sq weekly Stop glipizide SMBG: Libre 2. 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 Cost previously a barrier, though will hopefully qualify for MAP given enrollment in NovoCares. If not approved, Letta Pate is the most affordable SGLT2i option through [facility]. GLP1-RA: Could consider further titration to 2 mg dose on 2025 renewal application. Just received shipment of 1.0 mg pens.  Metformin: Took previously >20 years and was patient preference to discontinue due to fears seen online. Not ideal in the setting of her renal dysfunction.  TZD: Avoiding due to possible increase in fracture risk with better alternatives at this time.     2. CKD: Tolerating ARB well without concerns. Last UACR elevated at 59.9 mg/g (12/13/22), worsened from 4.0 mg/g in 2019 despite max dose of losartan. Patient is indicated for SGLT2i for  prevention of CKD/albuminuria progression. Will also befit glycemic control (as above). Will discuss with PCP.  Patient is hopefully eligible for Farxiga MAP ([place]) given current enrollment in NovoCares  Jardiance MAP ([employer] Cares) has stricter requirements, though will evaluate patient's eligibility  Future consideration: If approved, start Farxiga or Jardiance at CKD dosing: 10 mg once daily in the morning.  BMP ~4 weeks after SGLT2i initiation.    3. HTN: controlled based on last clinic BP of 130/66 mmHg (07/23/23), goal <130/80 mmHg. Does not monitor BP at home though has cuff if needed. Denies lightheadedness, dizziness, SOB, CP, vision changes.  Current Regimen: losartan 100 mg daily, amlodipine 5 mg daily  Continue medications without changes.    4. ASCVD (primary prevention): LDL  above goal  on last lipid panel with LDL 95 mg/dL, TG 161 mh/dL (0/9/60). LDL goal <70 mg/dL (primary prevention, diabetes). Given significantly elevated risk scores (below), would benefit from statin increase. Patient amenable though has been working hard toward improved diet. Reasonable to re-evaluate after repeat routine lipid panel.  Key risk factors include: diabetes, hypertension, hyperlipidemia, former smoker, and sedentary lifestyle The 10-year ASCVD risk score (Arnett DK, et al., 2019) is: 52.3% indicated patient is at very high risk.  PREVENT: 10-year risk of CVD = 32.7%; This individual has an estimated 10-year risk of Heart Failure = 22.4%  Current Regimen: rosuvastatin 20 mg daily  Future consideration: Consider increase rosuvastatin to 40 mg daily if LDL remains above goal despite recent weight loss/dietary modifications   5. Healthcare Maintenance:  Pneumococcal - Current status: Up to date (last received PPSV23 2017 after age 74)  Shingles - Current status: DUE (no record Shingrix in EMR) Influenza - Current status: DUE  Due to receive the following vaccines: Influenza, Shingrix, and Covid  Booster   Follow Up Follow up with clinical pharmacist via phone in 4-6 weeks.  ?   Future Appointments  Date Time Provider Department Center  09/18/2023 11:00 AM [facility] PHARMACIST [facility]  09/19/2023  3:15 PM Paci, Daisey Must, MD [facility] None  11/18/2023  8:50 AM Alphonzo Cruise, PTA [facility] None  12/23/2023  9:15 AM [facility] ANNUAL WELLNESS VISIT 1 [facility]  08/11/2024 11:15 AM [name], MD [facility] None   Loree Fee, PharmD Clinical Pharmacist [facility] [phone]

## 2023-09-17 NOTE — Progress Notes (Unsigned)
09/18/2023 Name: April Ross MRN: [id number] DOB: 07-05-1951  Subjective  Chief Complaint  Patient presents with   Diabetes   Hyperlipidemia    Reason for visit: April Ross is a 72 y.o. year old female who presented for a telephone visit.   They were referred to the pharmacist by their PCP for assistance in managing diabetes.   Care Team: Primary Care Provider: Excell Seltzer, MD  Reason for visit: ?  April Ross is a 72 y.o. female with a history of diabetes (type 2), who presents today for a follow up diabetes pharmacotherapy visit.? Pertinent PMH also includes CKD, HTN, HLD, hypothyroidism, osteoporosis, breast cancer.  Known DM Complications: nephropathy    Date of Last Diabetes Related Visit: 08/22/23 with Pharmacist  Summary of recent visits: ?  11/14: A1c 6.3%, dec from 6.8%. No longer having night lows since d/c PM glipizide though having afternoon lows. Stop AM glipizide. Libre 3+ Rx sent to pharmacy (on [place] [address]) 10/17: Ongoing c/f overnight lows w A1c 6.3%. Continue Ozempic 1 mg, ?? glipizide to 5 mg once daily (breakfast) 7/29: Pt self-?? glipizide back to 10 mg BID due to seeing higher sugars. Plan: ?? Ozempic to 1 mg weekly, ?? glipizide to 5 mg BID. @f /u consider MAP for SGLT2 given CKD. Patient is amenable. Consider April Ross ?Libre 3.  Prescription drug coverage: Payor: [employer] / Plan: [employer] PPO / Product Type: *No Product type* / .   Reports that all medications are affordable.  Current Patient Assistance: NovoNordisk (Ozempic) Medication Adherence: Patient denies missing doses of their medication.    Since Last visit / History of Present Illness: ?  Patient reports implementing plan from last visit. Confirms stopping glipizide altogether and has not had any further lows since.  Denies adverse effects with Ozempic 1 mg dose. Has several months of mediation remaining at home.   Reported DM Regimen: ?  Ozempic 1 mg sq once  weekly   DM medications tried in the past:?  metformin (patient preference to d/c due to seeing bad press about it online) Trulicity (Psychologist, prison and probation services)  SMBG  Libre 2 (Linked) : CGM Report (Date Range 11/28 - 12/11) ABG 167 mg/dL; GMI 1.3%; Variability 25.9%; TIR 71%, high 23%, very high 6%, low 0%, very low 0%   Hypo/Hyperglycemia: ?  Symptoms of hypoglycemia since last visit:? no (none since stopping glipizide).  Symptoms of hyperglycemia since last visit:? no - none  Exercise: None currently  DM Prevention:  Statin: Taking; high intensity.? History of chronic kidney disease? yes History of albuminuria? yes, last UACR on 12/13/22 = 59.9 mg/g ACE/ARB - Taking losartan 100 mg; Urine MA/CR Ratio - elevated urinary albumin excretion.  Last eye exam: 12/24/22; No retinopathy present Last foot exam: 07/23/2023 Tobacco Use: Former  Immunizations:? Flu: Due (Last: 07/30/2022); Pneumococcal: PPSV23 (2010, 05/2016) PCV13 (02/2014); Shingrix: No Record - DUE; Zostavax (08/2012); Covid (07/08/20, 08/06/20; No record of Booster)  Cardiovascular Risk Reduction History of clinical ASCVD? no The 10-year ASCVD risk score (Arnett DK, et al., 2019) is: 52.3% Prevent: 10-year risk of CVD = 32.7%; This individual has an estimated 10-year risk of Heart Failure = 22.4% History of heart failure? no N/A History of hyperlipidemia? yes Current BMI: 25.8 kg/m2 (Ht 62 in, Wt 64 kg) Taking statin? yes; high intensity (rosuvastatin 20 mg daily) Taking aspirin? not indicated; Not taking   Taking SGLT-2i? no Taking GLP- 1 RA? yes   Reported HTN Regimen: ?  Amlodipine 5  mg daily Losartan 100 mg daily  Patient takes their blood pressure medications in the morning Patient is not checking their blood pressure at home regularly. Patient denies hypotensive s/sx. No dizziness, lightheadedness.  Patient denies hypertensive symptoms. No headache, chest pain, shortness of breath, visual changes.     _______________________________________________  Objective    Review of Systems:?  Constitutional:? No fever, chills or unintentional weight loss  Cardiovascular:? No chest pain or pressure, shortness of breath, dyspnea on exertion, orthopnea or LE edema  Pulmonary:? No cough or shortness of breath  GI:? No nausea, vomiting, constipation, diarrhea, abdominal pain, dyspepsia, change in bowel habits  Endocrine:? No polyuria, polyphagia or blurred vision  Psych:? No depression, anxiety, insomnia  Physical Examination:  Vitals:  Wt Readings from Last 3 Encounters:  08/19/23 138 lb (62.6 kg)  08/12/23 139 lb 12.8 oz (63.4 kg)  07/23/23 141 lb 2 oz (64 kg)   BP Readings from Last 3 Encounters:  08/12/23 (!) 141/66  07/23/23 130/66  06/20/23 137/70   Pulse Readings from Last 3 Encounters:  08/12/23 75  07/23/23 73  06/20/23 68   Labs:?  Lab Results  Component Value Date   HGBA1C 6.3 (A) 07/23/2023   HGBA1C 6.8 (A) 04/02/2023   HGBA1C 7.4 (H) 12/13/2022   GLUCOSE 194 (H) 12/13/2022   MICRALBCREAT 59.9 (H) 12/13/2022   MICRALBCREAT 4.0 11/19/2017   CREATININE 1.23 (H) 12/13/2022   CREATININE 1.30 (H) 10/20/2022   CREATININE 1.29 (H) 06/14/2022   GFR 43.51 (L) 12/13/2022   GFR 41.23 (L) 06/14/2022   GFR 39.18 (L) 12/08/2021   Lab Results  Component Value Date   CHOL 169 12/13/2022   LDLCALC 61 10/28/2017   LDLCALC 85 05/09/2017   LDLDIRECT 95.0 12/13/2022   HDL 31.90 (L) 12/13/2022   HDL 30.10 (L) 06/14/2022   HDL 30.80 (L) 12/08/2021   AST 21 12/13/2022   AST 20 06/14/2022   ALT 29 12/13/2022   ALT 27 06/14/2022     Chemistry      Component Value Date/Time   NA 141 12/13/2022 0728   K 4.2 12/13/2022 0728   CL 106 12/13/2022 0728   CO2 26 12/13/2022 0728   BUN 30 (H) 12/13/2022 0728   CREATININE 1.23 (H) 12/13/2022 0728      Component Value Date/Time   CALCIUM 9.7 12/13/2022 0728   ALKPHOS 45 12/13/2022 0728   AST 21 12/13/2022 0728   ALT 29 12/13/2022  0728   BILITOT 0.5 12/13/2022 0728     Assessment and Plan:   1. Diabetes, type 2: controlled per last A1c of 6.3% (07/23/23), decreased from previous 6.8% (04/02/23) with goal <7% without hypoglycemia. Concern has been ongoing hypoglycemia on glipizide. Since stopping, GMI has increased slightly to 7.3% with BG elevation primarily s/p dinner. Discussed non-glipizide options including Ozempic titration, SGLT2i, metformin. Declines metformin due to reading bad press. Open to SGLT2i though reports income >limit for MAP. However, with removal of donut hole in 2025, she states a copay would likely be feasible.  Current Regimen: glipizide 5 mg qAM, Ozempic 1 mg sq weekly Increase Ozempic to 2 mg weekly (has >4 months of 1.0 mg at home, plans to double up) Submitted 2025 Novo renewal application for Ozempic 2.0 mg dose Future Consideration: SGLT2i: Patient agreeable to initiation in the new year once Medicare plan resets especially in the setting of CKD with microalbuminuria.Alternative options as needed: Branzavvy through [facility]. Metformin: Took previously >20 years and was patient preference to discontinue  due to fears seen online. Not ideal in the setting of her renal dysfunction.  TZD: Avoiding due to possible increase in fracture risk with better alternatives at this time.     2. CKD: Tolerating ARB well without concerns. Last UACR elevated at 59.9 mg/g (12/13/22), worsened from 4.0 mg/g in 2019 despite max dose of losartan. Patient is indicated for SGLT2i for prevention of CKD/albuminuria progression. Would like to start SGLT2i in 2025 (currently in donut hole for 2024)  Future consideration: Consider Start Farxiga or Jardiance at CKD dosing: 10 mg once daily in the morning.  BMP ~4 weeks after SGLT2i initiation.    3. HTN: controlled based on last clinic BP of 130/66 mmHg (07/23/23), goal <130/80 mmHg. Does not monitor BP at home though has cuff if needed. Denies lightheadedness,  dizziness, SOB, CP, vision changes.  Current Regimen: losartan 100 mg daily, amlodipine 5 mg daily  Continue medications without changes.    4. ASCVD (primary prevention): LDL  above goal  on last lipid panel with LDL 95 mg/dL, TG 960 mh/dL (01/11/39). LDL goal <70 mg/dL (primary prevention, diabetes). Given significantly elevated risk scores (below), would benefit from statin increase. Patient amenable though has been working hard toward improved diet. Reasonable to re-evaluate after repeat routine lipid panel in March.  Key risk factors include: diabetes, hypertension, hyperlipidemia, former smoker, and sedentary lifestyle The 10-year ASCVD risk score (Arnett DK, et al., 2019) is: 52.3% indicated patient is at very high risk.  PREVENT: 10-year risk of CVD = 32.7%; This individual has an estimated 10-year risk of Heart Failure = 22.4%  Current Regimen: rosuvastatin 20 mg daily  Future consideration: Consider increase rosuvastatin to 40 mg daily if LDL remains above goal despite recent weight loss/dietary modifications   5. Healthcare Maintenance:  Pneumococcal - Current status: Up to date (last received PPSV23 2017 after age 5)  Shingles - Current status: DUE (no record Shingrix in EMR) Influenza - Current status: DUE  Due to receive the following vaccines: Influenza, Shingrix, and Covid Booster   Follow Up Follow up with clinical pharmacist via phone in 4-6 weeks.  ?   Future Appointments  Date Time Provider Department Center  09/19/2023  3:15 PM Gwenith Daily, MD [facility] None  10/30/2023 11:00 AM [facility]CM PHARMACIST [facility]  11/18/2023  8:50 AM Alphonzo Cruise, PTA [facility] None  12/23/2023  9:15 AM [facility] ANNUAL WELLNESS VISIT 1 [facility]  08/11/2024 11:15 AM [name], MD [facility] None   Loree Fee, PharmD Clinical Pharmacist [facility] [phone]

## 2023-09-18 ENCOUNTER — Other Ambulatory Visit: Payer: PPO | Admitting: Pharmacist

## 2023-09-18 ENCOUNTER — Encounter: Payer: Self-pay | Admitting: Pharmacist

## 2023-09-18 NOTE — Progress Notes (Addendum)
Manufacturer Assistance Program (MAP) Application   Manufacturer: Novo Nordisk Medication(s): Ozempic (dose change request ? 2.0 mg) Patient Portion of Application: Completed with patient via online enrollment tool.  Uploaded to chart 09/18/23.  Income Documentation: Electronic verification elected. No income documentation required at this time.  Provider Portion of Application:  09/18/23: Provider portion placed in PCP inbox for signature. Prescription(s): Included in MAP application. Ozempic 2.0 mg dose requested.   1/27: Called Novo - confirmed updated 2.0 mg dose in their system.  2.0 mg shipment processed 1/23 (to be shipped/delivered 10-14 business days) Enrollment ends 02/09/24 Re-enrollment can be processed up to 30 days prior to expiration Confirmed refills of 2.0 mg through expiration date. No further action needed until then.  Next Steps:  Once MD has signed: []  Re-enrollment April 2025

## 2023-09-18 NOTE — Patient Instructions (Addendum)
April Ross,   It was a pleasure to see you today! As we discussed:?   Continue Ozempic 1.0 mg under the skin once weekly Stop glipizide  We discussed the following possible future considerations:  We discussed [name] again. The online enrollment tool is only available for re-enrollment (not for new applications). It only works if it is able to locate your profile through previous enrollment.   Changes to Medicare Part D (Drug Coverage) in 2025 As a part of the Inflation Reduction Act (IRA) implemented by the [employer], in 2025,  Medicare must get rid of the "[employer]" coverage gap. This means that if you are used to your medication costs increasing significantly during the year, ideally this will no longer be an issue starting in 2025.   Medicare Part D: the following changes are expected in 2025: Maximum out of pocket drug expenses (amount you are paying at the pharmacy) will be capped at $2,000 per year (this does not mean you pay full price until $2,000) Getting rid of the Coverage Gap [name]")  -  (If you reach the out of pocket maximum of $2,000 in copays, your medication costs should drastically DECREASE thereafter, instead of increase)  Please reach out prior to your next scheduled appointment should you have any questions or concerns.  Thank you!   Future Appointments  Date Time Provider Department Center  09/19/2023  3:15 PM Gwenith Daily, MD [facility] None  11/18/2023  8:50 AM Alphonzo Cruise, PTA [facility] None  12/23/2023  9:15 AM [facility] ANNUAL WELLNESS VISIT 1 [facility]  08/11/2024 11:15 AM [name], MD [facility] None    Berenice Primas, PharmD - Clinical Pharmacist

## 2023-09-19 ENCOUNTER — Encounter: Payer: Self-pay | Admitting: Dermatology

## 2023-09-19 ENCOUNTER — Ambulatory Visit: Payer: PPO | Admitting: Dermatology

## 2023-09-19 VITALS — BP 130/69

## 2023-09-19 DIAGNOSIS — L814 Other melanin hyperpigmentation: Secondary | ICD-10-CM

## 2023-09-19 DIAGNOSIS — L989 Disorder of the skin and subcutaneous tissue, unspecified: Secondary | ICD-10-CM | POA: Diagnosis not present

## 2023-09-19 NOTE — Patient Instructions (Signed)

## 2023-09-19 NOTE — Progress Notes (Signed)
   Follow-Up Visit   Subjective  April Ross is a 72 y.o. female who presents for the following: following up on brown macule on right nasal tip, present for 2 years. Patient does not feel like it has changed since her last visit.  The following portions of the chart were reviewed this encounter and updated as appropriate: medications, allergies, medical history  Review of Systems:  No other skin or systemic complaints except as noted in HPI or Assessment and Plan.  Objective  Well appearing patient in no apparent distress; mood and affect are within normal limits.   A focused examination was performed of the following areas:   Relevant exam findings are noted in the Assessment and Plan.    Assessment & Plan   SK vs Lentigo- Right nasal tip Exam: Brown macule of right nose Due to sun exposure Treatment Plan: Benign-appearing, observe. Recommend daily broad spectrum sunscreen SPF 30+ to sun-exposed areas, reapply every 2 hours as needed.  Call for any changes  Consistent with photo taken in September 2024    Return if symptoms worsen or fail to improve.  I, Joanie Coddington, CMA, am acting as scribe for Gwenith Daily, MD .   Documentation: I have reviewed the above documentation for accuracy and completeness, and I agree with the above.  Gwenith Daily, MD

## 2023-09-28 ENCOUNTER — Other Ambulatory Visit: Payer: Self-pay | Admitting: Family Medicine

## 2023-09-30 NOTE — Telephone Encounter (Signed)
Last office visit 07/23/2023 for Bone Densiety Results and Back Pain.  Last refilled 07/01/2023 for #30 with no refills.  Next Appt: No future appointments with PCP.

## 2023-10-08 DIAGNOSIS — M3501 Sicca syndrome with keratoconjunctivitis: Secondary | ICD-10-CM | POA: Diagnosis not present

## 2023-10-08 DIAGNOSIS — E507 Other ocular manifestations of vitamin A deficiency: Secondary | ICD-10-CM | POA: Diagnosis not present

## 2023-10-08 DIAGNOSIS — H9311 Tinnitus, right ear: Secondary | ICD-10-CM | POA: Diagnosis not present

## 2023-10-08 DIAGNOSIS — K117 Disturbances of salivary secretion: Secondary | ICD-10-CM | POA: Diagnosis not present

## 2023-10-08 DIAGNOSIS — K1120 Sialoadenitis, unspecified: Secondary | ICD-10-CM | POA: Diagnosis not present

## 2023-10-20 ENCOUNTER — Other Ambulatory Visit: Payer: Self-pay | Admitting: Hematology and Oncology

## 2023-10-25 DIAGNOSIS — E119 Type 2 diabetes mellitus without complications: Secondary | ICD-10-CM | POA: Diagnosis not present

## 2023-10-25 DIAGNOSIS — H2513 Age-related nuclear cataract, bilateral: Secondary | ICD-10-CM | POA: Diagnosis not present

## 2023-10-25 DIAGNOSIS — H40013 Open angle with borderline findings, low risk, bilateral: Secondary | ICD-10-CM | POA: Diagnosis not present

## 2023-10-25 DIAGNOSIS — H353131 Nonexudative age-related macular degeneration, bilateral, early dry stage: Secondary | ICD-10-CM | POA: Diagnosis not present

## 2023-10-25 LAB — HM DIABETES EYE EXAM

## 2023-10-30 ENCOUNTER — Other Ambulatory Visit: Payer: PPO

## 2023-10-30 DIAGNOSIS — E1121 Type 2 diabetes mellitus with diabetic nephropathy: Secondary | ICD-10-CM

## 2023-10-30 NOTE — Progress Notes (Unsigned)
10/31/2023 Name: April Ross MRN: [id number] DOB: [birth year]  Subjective  Chief Complaint  Patient presents with   Diabetes    Reason for visit: April Ross is a 73 y.o. female who presented for a telephone visit.   They were referred to the pharmacist by their PCP for assistance in managing diabetes.   Care Team: Primary Care Provider: Excell Seltzer, MD  Reason for visit: ?  April Ross is a 73 y.o. female with a history of diabetes (type 2), who presents today for a follow up diabetes pharmacotherapy visit.? Pertinent PMH also includes CKD, HTN, HLD, hypothyroidism, osteoporosis, breast cancer.  Known DM Complications: nephropathy   Date of Last Diabetes Related Visit: 09/18/23 with Pharmacist  Summary of recent visits: ?  12/11: ?? Ozempic to 2 mg weekly (though has >4 months of 1 mg dose remaining. Wants to double up). Novo 2025 ap submitted for 2.0 mg.  11/14: A1c 6.3%, dec from 6.8%. No longer having night lows since d/c PM glipizide though having afternoon lows. Stop AM glipizide. Libre 3+ Rx sent to pharmacy (on [place] [address]) 10/17: Ongoing c/f overnight lows w A1c 6.3%. Continue Ozempic 1 mg, ?? glipizide to 5 mg once daily (breakfast) 7/29: Pt self-?? glipizide back to 10 mg BID due to seeing higher sugars. Plan: ?? Ozempic to 1 mg weekly, ?? glipizide to 5 mg BID. @f /u consider MAP for SGLT2 given CKD. Patient is amenable. Consider Truitt Merle ?Libre 3.  Prescription drug coverage: Payor: [employer] / Plan: [employer] PPO / Product Type: *No Product type* / .   Reports that all medications are affordable.  Current Patient Assistance: NovoNordisk (Ozempic) Medication Adherence: Patient denies missing doses of their medication.     Since Last visit / History of Present Illness: ?  Patient reports implementing plan from last visit.  Notes BG have been remaining in normal range. Occasional highs with higher carb meals though never unexplained.  Denies adverse effects with Ozempic 2 mg dose. On day 2 feels a little less energy. 5 weeks of Ozempic remaining.   Referral to rheumatology from ENT for evaluation of possible Sjogren.  Scheduled next Tuesday. Also scheduled hearing exam for February 11th.   Reported DM Regimen: ?  Ozempic 2 mg sq once weekly (Sun)   DM medications tried in the past:?  metformin (patient preference to d/c due to seeing bad press about it online) Trulicity (Psychologist, prison and probation services)  SMBG  Libre 3 : No data at time of phone visit. Seems patient's account became unlinked from [facility]reView when she switched from [place] [address] to [place] 3.  Assisted patient with re-linking with clinic. Clinic Code: [email or web address] Appears she created a new account with a new email address (gmail)    Hypo/Hyperglycemia: ?  Symptoms of hypoglycemia since last visit:? no (none since stopping glipizide).  Symptoms of hyperglycemia since last visit:? no   Exercise: None currently  DM Prevention:  Statin: Taking; high intensity.? History of chronic kidney disease? yes History of albuminuria? yes, last UACR on 12/13/22 = 59.9 mg/g ACE/ARB - Taking losartan 100 mg; Urine MA/CR Ratio - elevated urinary albumin excretion.  Last eye exam: 12/24/22; No retinopathy present Last foot exam: 07/23/2023 Tobacco Use: Former  Immunizations:? Flu: Due (Last: 07/30/2022); Pneumococcal: PPSV23 (2010, 05/2016) PCV13 (02/2014); Shingrix: No Record - DUE; Zostavax (08/2012); Covid (07/08/20, 08/06/20; No record of Booster)  Cardiovascular Risk Reduction History of clinical ASCVD? no The 10-year ASCVD risk score (Arnett DK, et al., 2019)  is: 46.6% Prevent: 10-year risk of CVD = 32.7%; This individual has an estimated 10-year risk of Heart Failure = 22.4% History of heart failure? no N/A History of hyperlipidemia? yes Current BMI: 25.8 kg/m2 (Ht 62 in, Wt 64 kg) Taking statin? yes; high intensity (rosuvastatin 20 mg daily) Taking aspirin? not indicated;  Not taking   Taking SGLT-2i? no Taking GLP- 1 RA? yes   Reported HTN Regimen: ?  Amlodipine 5 mg daily Losartan 100 mg daily  Patient takes their blood pressure medications in the morning Patient is not checking their blood pressure at home regularly. Patient denies hypotensive s/sx. No dizziness, lightheadedness.  Patient denies hypertensive symptoms. No headache, chest pain, shortness of breath, visual changes.    _______________________________________________  Objective    Review of Systems:?  Constitutional:? No fever, chills or unintentional weight loss  Cardiovascular:? No chest pain or pressure, shortness of breath, dyspnea on exertion, orthopnea or LE edema  Pulmonary:? No cough or shortness of breath  GI:? No nausea, vomiting, constipation, diarrhea, abdominal pain, dyspepsia, change in bowel habits  Endocrine:? No polyuria, polyphagia or blurred vision  Psych:? No depression, anxiety, insomnia  Physical Examination:  Vitals:  Wt Readings from Last 3 Encounters:  08/19/23 138 lb (62.6 kg)  08/12/23 139 lb 12.8 oz (63.4 kg)  07/23/23 141 lb 2 oz (64 kg)   BP Readings from Last 3 Encounters:  09/19/23 130/69  08/12/23 (!) 141/66  07/23/23 130/66   Pulse Readings from Last 3 Encounters:  08/12/23 75  07/23/23 73  06/20/23 68   Labs:?  Lab Results  Component Value Date   HGBA1C 6.3 (A) 07/23/2023   HGBA1C 6.8 (A) 04/02/2023   HGBA1C 7.4 (H) 12/13/2022   GLUCOSE 194 (H) 12/13/2022   MICRALBCREAT 59.9 (H) 12/13/2022   MICRALBCREAT 4.0 11/19/2017   CREATININE 1.23 (H) 12/13/2022   CREATININE 1.30 (H) 10/20/2022   CREATININE 1.29 (H) 06/14/2022   GFR 43.51 (L) 12/13/2022   GFR 41.23 (L) 06/14/2022   GFR 39.18 (L) 12/08/2021   Lab Results  Component Value Date   CHOL 169 12/13/2022   LDLCALC 61 10/28/2017   LDLCALC 85 05/09/2017   LDLDIRECT 95.0 12/13/2022   HDL 31.90 (L) 12/13/2022   HDL 30.10 (L) 06/14/2022   HDL 30.80 (L) 12/08/2021   AST 21  12/13/2022   AST 20 06/14/2022   ALT 29 12/13/2022   ALT 27 06/14/2022     Chemistry      Component Value Date/Time   NA 141 12/13/2022 0728   K 4.2 12/13/2022 0728   CL 106 12/13/2022 0728   CO2 26 12/13/2022 0728   BUN 30 (H) 12/13/2022 0728   CREATININE 1.23 (H) 12/13/2022 0728      Component Value Date/Time   CALCIUM 9.7 12/13/2022 0728   ALKPHOS 45 12/13/2022 0728   AST 21 12/13/2022 0728   ALT 29 12/13/2022 0728   BILITOT 0.5 12/13/2022 0728     Assessment and Plan:   1. Diabetes, type 2: controlled per last A1c of 6.3% (07/23/23), decreased from previous 6.8% (04/02/23) with goal <7% without hypoglycemia. GMI 6.9%, no c/f lows. Novo automated system still indicates 1.0 mg despite 2.0 mg form previously submitted at end of 2025.  Current Regimen: Ozempic 2 mg sq weekly Re-uploaded Novo Dose change form to eFax folder for PCP signature, msgd PCP clinical pool to ensure signed/faxed.  Future Consideration: SGLT2i: SGLT2i may be beneficial for renal protection especially in the setting of CKD with microalbuminuria  if UACR remains elevated on repeat labs. Due for repeat UACR, baseline BMP also needed. Die for annual visit March.  Metformin: Took previously >20 years and was patient preference to discontinue due to fears seen online. Not ideal in the setting of her renal dysfunction.  TZD: Avoiding due to possible increase in fracture risk with better alternatives at this time.    2. CKD: Tolerating ARB well without concerns. Last UACR elevated at 59.9 mg/g (12/13/22), worsened from 4.0 mg/g in 2019 despite max dose of losartan. Patient is indicated for SGLT2i for prevention of CKD/albuminuria progression esp if UACR remains elevated on annual labs. Continue medications without changes.   Future consideration: Consider Start Farxiga or Jardiance at CKD dosing: 10 mg once daily in the morning s/p routine labs. F/u office visit not currently scheduled.  Msgd Clinical pool per patient  request.   3. HTN: controlled based on last clinic BP of 130/66 mmHg (07/23/23), goal <130/80 mmHg. Does not monitor BP at home though has cuff if needed. Denies lightheadedness, dizziness, SOB, CP, vision changes.  Current Regimen: losartan 100 mg daily, amlodipine 5 mg daily  Continue medications without changes.    4. ASCVD (primary prevention): LDL  above goal  on last lipid panel with LDL 95 mg/dL, TG 098 mh/dL (10/08/89). LDL goal <70 mg/dL (primary prevention, diabetes). Given significantly elevated risk scores (below), would benefit from statin increase. Patient amenable though has been working hard toward improved diet. Reasonable to re-evaluate after repeat routine lipid panel in March.  Key risk factors include: diabetes, hypertension, hyperlipidemia, former smoker, and sedentary lifestyle The 10-year ASCVD risk score (Arnett DK, et al., 2019) is: 46.6% indicated patient is at very high risk.  PREVENT: 10-year risk of CVD = 32.7%; This individual has an estimated 10-year risk of Heart Failure = 22.4%  Current Regimen: rosuvastatin 20 mg daily Continue medications without changes.   Future consideration: Consider increase rosuvastatin to 40 mg daily if LDL remains above goal despite recent weight loss/dietary modifications   5. Healthcare Maintenance:  Pneumococcal - Current status: Up to date (last received PPSV23 2017 after age 59)  Shingles - Current status: DUE (no record Shingrix in EMR) Influenza - Current status: DUE  Due to receive the following vaccines: Influenza, Shingrix, and Covid Booster   Follow Up Follow up with clinical pharmacist via phone in 4-6 weeks after PCP visit or sooner as needed if concerns on labs ?   Future Appointments  Date Time Provider Department Center  11/18/2023  8:50 AM Berna Spare A, PTA [facility] None  12/18/2023  8:45 AM [facility] LAB [facility]  12/23/2023  9:30 AM [facility] ANNUAL WELLNESS VISIT 1 [facility]  12/25/2023 10:20  AM Excell Seltzer, MD [facility] PEC  02/05/2024  9:30 AM [facility] CCM PHARMACIST [facility] PEC  08/11/2024 11:15 AM [name], MD [facility] None   Loree Fee, PharmD Clinical Pharmacist [facility] [phone]

## 2023-10-30 NOTE — Patient Instructions (Signed)
Ms. April Ross,   It was a pleasure to see you today! As we discussed:?   Continue Ozempic 2.0 mg under the skin once weekly We resubmitted the Dose Change Form for Novo Nordisk today for the 2.0 mg dose (as their automated system reports 1.0 mg dose). Will also attemt to call to speak to a rep before they refill the 1.0 mg due to start processing 11/07/22.  I messaged Dr Daphine Deutscher clinical team regarding your March appointment   We discussed the following possible future considerations:  We discussed [name] or London Pepper as possible options, especially if your labs this year show protein in the urine.   Please reach out prior to your next scheduled appointment should you have any questions or concerns.  Thank you!   Future Appointments  Date Time Provider Department Center  11/18/2023  8:50 AM Alphonzo Cruise, PTA [facility] None  12/23/2023  9:30 AM [facility] ANNUAL WELLNESS VISIT 1 [facility]  08/11/2024 11:15 AM [name], MD [facility] None    Berenice Primas, PharmD - Clinical Pharmacist

## 2023-10-30 NOTE — Progress Notes (Signed)
Novo Nordisk PAP forms signed by Dr. Ermalene Searing and faxed to [phone].  Forms sent to scanning.

## 2023-10-31 ENCOUNTER — Telehealth: Payer: Self-pay | Admitting: *Deleted

## 2023-10-31 NOTE — Telephone Encounter (Signed)
Please call and schedule CPE with fasting labs prior after 12/23/2023 for Dr. Ermalene Searing.

## 2023-10-31 NOTE — Telephone Encounter (Signed)
Lvmtcb

## 2023-10-31 NOTE — Telephone Encounter (Signed)
Novo Nordisk Dose Change Form signed by Dr. Ermalene Searing and faxed back on 10/30/2023.

## 2023-10-31 NOTE — Telephone Encounter (Signed)
-----   Message from Loree Fee sent at 10/30/2023  4:57 PM EST ----- Pt is confused about March f/u visit via telephone. She reports she should have her annual physical/labs around March and would like this brought up to [facility] team.

## 2023-10-31 NOTE — Telephone Encounter (Signed)
Call pt and schedule a appt for cpe

## 2023-10-31 NOTE — Telephone Encounter (Signed)
-----   Message from Loree Fee sent at 10/30/2023  4:38 PM EST ----- [employer] Dose Change Form uploaded to [employer].  For some reason Novo does not have her 2mg  dose  Please have signed and faxed to Novo (Fax# on the form). Let me know if you have questions!

## 2023-11-05 DIAGNOSIS — R682 Dry mouth, unspecified: Secondary | ICD-10-CM | POA: Diagnosis not present

## 2023-11-05 DIAGNOSIS — Z6824 Body mass index (BMI) 24.0-24.9, adult: Secondary | ICD-10-CM | POA: Diagnosis not present

## 2023-11-05 DIAGNOSIS — K1120 Sialoadenitis, unspecified: Secondary | ICD-10-CM | POA: Diagnosis not present

## 2023-11-05 DIAGNOSIS — H04123 Dry eye syndrome of bilateral lacrimal glands: Secondary | ICD-10-CM | POA: Diagnosis not present

## 2023-11-05 DIAGNOSIS — R5383 Other fatigue: Secondary | ICD-10-CM | POA: Diagnosis not present

## 2023-11-12 ENCOUNTER — Other Ambulatory Visit: Payer: Self-pay | Admitting: Family Medicine

## 2023-11-12 NOTE — Telephone Encounter (Signed)
Last office visit 07/23/2023 for DM, Osteoporosis, Back and Chest wall pain.  Last refilled 09/302/024 for #90 with no refills.  Next Appt: CPE 12/25/2023.

## 2023-11-18 ENCOUNTER — Ambulatory Visit: Payer: PPO | Attending: General Surgery

## 2023-11-18 VITALS — Wt 131.4 lb

## 2023-11-18 DIAGNOSIS — Z483 Aftercare following surgery for neoplasm: Secondary | ICD-10-CM | POA: Insufficient documentation

## 2023-11-18 NOTE — Therapy (Signed)
 OUTPATIENT PHYSICAL THERAPY SOZO SCREENING NOTE   Patient Name: April Ross Susan MRN: [id number] DOB:June 11, 1951, 73 y.o., female Today's Date: 11/18/2023  PCP: Judithann Novas, MD REFERRING PROVIDER: Caralyn Chandler, MD   PT End of Session - 11/18/23 [phone]     Visit Number 19   # unchanged due to screen only   PT Start Time 0838    PT Stop Time 0843    PT Time Calculation (min) 5 min    Activity Tolerance Patient tolerated treatment well    Behavior During Therapy [facility] for tasks assessed/performed             Past Medical History:  Diagnosis Date   Allergy    Arthritis    Breast cancer (HCC)    right side 07/2021   Cancer (HCC)    CRD (chronic renal disease)    stage 3   Depression    Diabetes mellitus without complication (HCC)    GERD (gastroesophageal reflux disease)    Hyperlipidemia    Hypertension    LBBB (left bundle branch block)    a. diagnosed in 10/2017 - echo showed a preserved EF of 55-60% and NST was low-risk showing no evidence of ischemia.    Osteoporosis    polyps in colon    Thyroid  disease    Urinary tract infection    Past Surgical History:  Procedure Laterality Date   ABDOMINAL HYSTERECTOMY     APPENDECTOMY     BREAST BIOPSY Right    BREAST LUMPECTOMY Right 07/2021   BREAST LUMPECTOMY WITH RADIOACTIVE SEED AND SENTINEL LYMPH NODE BIOPSY Right 07/24/2021   Procedure: RIGHT BREAST LUMPECTOMY WITH RADIOACTIVE SEED AND SENTINEL LYMPH NODE BIOPSY;  Surgeon: Caralyn Chandler, MD;  Location: [facility];  Service: General;  Laterality: Right;   CARPAL TUNNEL RELEASE Right 11/14/2022   Procedure: CARPAL TUNNEL RELEASE;  Surgeon: Marilyn Shropshire, MD;  Location: [facility];  Service: Orthopedics;  Laterality: Right;  or MAC with local   Patient Active Problem List   Diagnosis Date Noted   Anterior chest wall pain 07/23/2023   Microalbuminuria 04/02/2023   Skin lesion of back 04/02/2023   Chronic tension-type headache, not  intractable 07/25/2021   Carcinoma of upper-inner quadrant of right breast in female, estrogen receptor positive (HCC) 06/15/2021   Diabetic retinopathy (HCC) 11/24/2020   Acute midline thoracic back pain 11/21/2018   Type 2 diabetes mellitus with nephropathy (HCC) 10/27/2017   Osteoporosis 05/24/2016   CKD (chronic kidney disease) stage 3, GFR 30-59 ml/min (HCC) 05/08/2016   Hyperlipidemia associated with type 2 diabetes mellitus (HCC) 01/12/2016   Hypothyroidism 01/12/2016   Hypertension associated with diabetes (HCC) 01/12/2016   Allergic rhinitis 01/12/2016   Moderate major depression, single episode (HCC) 01/12/2016   Chronic insomnia 01/12/2016   GERD (gastroesophageal reflux disease) 01/12/2016    REFERRING DIAG: right breast cancer at risk for lymphedema  THERAPY DIAG: Aftercare following surgery for neoplasm  PERTINENT HISTORY: Pt will be had a Rt lumpectomy with 2/11 lymph nodes positive on 07/24/21.Had infection and drainage just proximal to incision site during radiatio Done with radiation 2/7/20923 . No chemotherapy and now on antiestrogen.   Pt has an increase in SOZO and so is now wearing a sleeve for 4 weeks  other history includes: DM, stage 3 CKD, osteoporosis  PRECAUTIONS: right UE Lymphedema risk, None  SUBJECTIVE: Pt returns for her L-Dex screen.   PAIN:  Are you having pain? No  SOZO  SCREENING:Patient was assessed today using the SOZO machine to determine the lymphedema index score. This was compared to her baseline score. It was determined that she is NOT within the recommended range when compared to her baseline. She will again resume wearing her compression sleeve during day some and sleep in her nighttime garment. She wants to cont 3 month monitoring as it seems she may always have subclinical lymphedema due to the higher amount of lymph nodes removed and she is learning to manage her symptoms.     L-DEX FLOWSHEETS - 11/18/23 0800       L-DEX LYMPHEDEMA  SCREENING   Measurement Type Unilateral    L-DEX MEASUREMENT EXTREMITY Upper Extremity    POSITION  Standing    DOMINANT SIDE Right    At Risk Side Right    BASELINE SCORE (UNILATERAL) -5.9    L-DEX SCORE (UNILATERAL) 2.2    VALUE CHANGE (UNILAT) 8.1            P: Pt to have another 3 month SOZO despite being at her 2 years from surgery with her history of lymphedema.    Denyce Flank, PTA 11/18/2023, 8:43 AM

## 2023-11-19 ENCOUNTER — Telehealth: Payer: Self-pay | Admitting: Family Medicine

## 2023-11-19 DIAGNOSIS — H903 Sensorineural hearing loss, bilateral: Secondary | ICD-10-CM | POA: Diagnosis not present

## 2023-11-19 NOTE — Telephone Encounter (Signed)
Received from [employer] PAP:  Ozempic 4 mg/3 ml x 4 pens.  Lot: [id number] Exp: 06/07/2026.  April Ross notified by telephone that medication is ready to be picked up here at the office.

## 2023-11-25 NOTE — Telephone Encounter (Signed)
 Patient came by and picked up medication.

## 2023-12-02 ENCOUNTER — Telehealth: Payer: Self-pay | Admitting: *Deleted

## 2023-12-02 DIAGNOSIS — Z6823 Body mass index (BMI) 23.0-23.9, adult: Secondary | ICD-10-CM | POA: Diagnosis not present

## 2023-12-02 DIAGNOSIS — E1121 Type 2 diabetes mellitus with diabetic nephropathy: Secondary | ICD-10-CM

## 2023-12-02 DIAGNOSIS — M3501 Sicca syndrome with keratoconjunctivitis: Secondary | ICD-10-CM | POA: Diagnosis not present

## 2023-12-02 DIAGNOSIS — K1120 Sialoadenitis, unspecified: Secondary | ICD-10-CM | POA: Diagnosis not present

## 2023-12-02 DIAGNOSIS — E1169 Type 2 diabetes mellitus with other specified complication: Secondary | ICD-10-CM

## 2023-12-02 NOTE — Telephone Encounter (Signed)
-----   Message from Lovena Neighbours sent at 12/02/2023 11:04 AM EST ----- Regarding: Labs for Wednesday 3.12.25 Please put lab orders in future. Thank you, Denny Peon

## 2023-12-10 ENCOUNTER — Telehealth: Payer: Self-pay

## 2023-12-10 ENCOUNTER — Telehealth: Payer: Self-pay | Admitting: Family Medicine

## 2023-12-10 NOTE — Telephone Encounter (Signed)
 Call pt asking to see if she wants to started processing application for [employer]) is coming up due on 02/09/24,pt said she has schedule a phone call with RPH Linsay to go over this.

## 2023-12-10 NOTE — Telephone Encounter (Signed)
 Received from [employer] PAP:  Ozempic 8mg /59ml x 4 pens.  Lot [id number] Exp: 06/07/2026.  Left message for Ms. Rafanan that medication is ready to be picked up at our office.

## 2023-12-18 ENCOUNTER — Other Ambulatory Visit (INDEPENDENT_AMBULATORY_CARE_PROVIDER_SITE_OTHER): Payer: PPO

## 2023-12-18 DIAGNOSIS — E785 Hyperlipidemia, unspecified: Secondary | ICD-10-CM | POA: Diagnosis not present

## 2023-12-18 DIAGNOSIS — E1121 Type 2 diabetes mellitus with diabetic nephropathy: Secondary | ICD-10-CM

## 2023-12-18 DIAGNOSIS — E1169 Type 2 diabetes mellitus with other specified complication: Secondary | ICD-10-CM | POA: Diagnosis not present

## 2023-12-18 LAB — COMPREHENSIVE METABOLIC PANEL
ALT: 40 U/L — ABNORMAL HIGH (ref 0–35)
AST: 26 U/L (ref 0–37)
Albumin: 4.6 g/dL (ref 3.5–5.2)
Alkaline Phosphatase: 43 U/L (ref 39–117)
BUN: 21 mg/dL (ref 6–23)
CO2: 27 meq/L (ref 19–32)
Calcium: 9.9 mg/dL (ref 8.4–10.5)
Chloride: 105 meq/L (ref 96–112)
Creatinine, Ser: 1.23 mg/dL — ABNORMAL HIGH (ref 0.40–1.20)
GFR: 43.2 mL/min — ABNORMAL LOW (ref >60.00)
Glucose, Bld: 182 mg/dL — ABNORMAL HIGH (ref 70–99)
Potassium: 4.2 meq/L (ref 3.5–5.1)
Sodium: 139 meq/L (ref 135–145)
Total Bilirubin: 0.6 mg/dL (ref 0.2–1.2)
Total Protein: 6.8 g/dL (ref 6.0–8.3)

## 2023-12-18 LAB — LIPID PANEL
Cholesterol: 162 mg/dL (ref 0–200)
HDL: 37.4 mg/dL — ABNORMAL LOW (ref >39.00)
LDL Cholesterol: 82 mg/dL (ref 0–99)
NonHDL: 124.87
Total CHOL/HDL Ratio: 4
Triglycerides: 214 mg/dL — ABNORMAL HIGH (ref 0.0–149.0)
VLDL: 42.8 mg/dL — ABNORMAL HIGH (ref 0.0–40.0)

## 2023-12-18 LAB — MICROALBUMIN / CREATININE URINE RATIO
Creatinine,U: 125.1 mg/dL
Microalb Creat Ratio: 492.5 mg/g — ABNORMAL HIGH (ref 0.0–30.0)
Microalb, Ur: 61.6 mg/dL — ABNORMAL HIGH (ref 0.0–1.9)

## 2023-12-18 LAB — HEMOGLOBIN A1C: Hgb A1c MFr Bld: 6.9 % — ABNORMAL HIGH (ref 4.6–6.5)

## 2023-12-18 NOTE — Telephone Encounter (Signed)
 Pt picked up meds on 12/18/23

## 2023-12-19 ENCOUNTER — Encounter: Payer: Self-pay | Admitting: Family Medicine

## 2023-12-19 NOTE — Progress Notes (Signed)
 No critical labs need to be addressed urgently. We will discuss labs in detail at upcoming office visit.

## 2023-12-23 ENCOUNTER — Ambulatory Visit (INDEPENDENT_AMBULATORY_CARE_PROVIDER_SITE_OTHER): Payer: PPO

## 2023-12-23 VITALS — Ht 62.0 in | Wt 131.0 lb

## 2023-12-23 DIAGNOSIS — Z0000 Encounter for general adult medical examination without abnormal findings: Secondary | ICD-10-CM | POA: Diagnosis not present

## 2023-12-23 NOTE — Progress Notes (Signed)
 Please attest and cosign this visit due to patients primary care provider not being in the office at the time the visit was completed.    Subjective:   April Ross is a 73 y.o. who presents for a Medicare Wellness preventive visit.  Visit Complete: Virtual I connected with  April Ross on 12/23/23 by a audio enabled telemedicine application and verified that I am speaking with the correct person using two identifiers.  Patient Location: Home  Provider Location: Home Office  I discussed the limitations of evaluation and management by telemedicine. The patient expressed understanding and agreed to proceed.  Vital Signs: Because this visit was a virtual/telehealth visit, some criteria may be missing or patient reported. Any vitals not documented were not able to be obtained and vitals that have been documented are patient reported.  VideoDeclined- This patient declined Librarian, academic. Therefore the visit was completed with audio only.  Persons Participating in Visit: Patient.  AWV Questionnaire: Yes: Patient Medicare AWV questionnaire was completed by the patient on 12/18/2023; I have confirmed that all information answered by patient is correct and no changes since this date.  Cardiac Risk Factors include: advanced age (>78men, >84 women);diabetes mellitus;dyslipidemia;hypertension     Objective:    Today's Vitals   12/23/23 0933  Weight: 131 lb (59.4 kg)  Height: 5\' 2"  (1.575 m)   Body mass index is 23.96 kg/m.     12/23/2023    9:48 AM 12/17/2022    9:38 AM 11/14/2022    7:14 AM 11/07/2022    2:24 PM 10/20/2022    6:28 AM 11/20/2021    7:59 AM 08/17/2021    7:38 AM  Advanced Directives  Does Patient Have a Medical Advance Directive? Yes No No No No No Yes  Type of Estate agent of [place];Living will      Living will  Copy of Healthcare Power of Attorney in Chart? No - copy requested        Would patient like  information on creating a medical advance directive?  No - Patient declined No - Patient declined No - Patient declined No - Patient declined No - Patient declined No - Patient declined    Current Medications (verified) Outpatient Encounter Medications as of 12/23/2023  Medication Sig   Alcohol Swabs (B-D SINGLE USE SWABS REGULAR) PADS Use to check blood sugar up to 2 times a day   alendronate (FOSAMAX) 70 MG tablet Take 1 tablet (70 mg total) by mouth every 7 (seven) days. Take with a full glass of water on an empty stomach.   amLODipine (NORVASC) 5 MG tablet TAKE 1 TABLET (5 MG TOTAL) BY MOUTH DAILY.   APPLE CIDER VINEGAR PO Take by mouth.   Blood Glucose Monitoring Suppl (ONETOUCH VERIO REFLECT) w/Device KIT Use to check blood sugar up to 2 times a day   cetirizine (ZYRTEC) 10 MG tablet Take 10 mg by mouth at bedtime as needed for allergies.   CINNAMON PO Take 1 tablet by mouth daily.   Continuous Glucose Sensor (FREESTYLE LIBRE 3 PLUS SENSOR) MISC Use to check glucose continuously. Change sensor every 15 days.   gabapentin (NEURONTIN) 100 MG capsule Take 100 mg by mouth 3 (three) times daily as needed.   glucose blood (ONETOUCH VERIO) test strip Use to check blood sugar up to 2 times a day   Lancets (ONETOUCH DELICA PLUS LANCET30G) MISC Use to check blood sugar up to 2 times a day  letrozole (FEMARA) 2.5 MG tablet TAKE 1 TABLET BY MOUTH EVERY DAY   levothyroxine (SYNTHROID) 88 MCG tablet TAKE 1 TABLET BY MOUTH EVERY DAY   losartan (COZAAR) 100 MG tablet TAKE 1 TABLET BY MOUTH EVERY DAY   NON FORMULARY Ginsing root   Omega-3 Fatty Acids (FISH OIL OMEGA-3 PO) Take 1 capsule by mouth daily.   OVER THE COUNTER MEDICATION Liver gallbladder support tablets   OVER THE COUNTER MEDICATION Leg and back pain relief tablets   PREBIOTIC PRODUCT PO Take by mouth.   rosuvastatin (CRESTOR) 20 MG tablet TAKE 1 TABLET BY MOUTH EVERY DAY   Semaglutide, 1 MG/DOSE, 4 MG/3ML SOPN Inject 1 mg as directed once  a week. Patient Assistance.   tiZANidine (ZANAFLEX) 4 MG tablet TAKE 1 TABLET BY MOUTH AT BEDTIME AS NEEDED FOR MUSCLE SPASMS   traMADol (ULTRAM) 50 MG tablet TAKE 1 TABLET BY MOUTH EVERY DAY AS NEEDED   Turmeric (QC TUMERIC COMPLEX PO) Take 1 tablet by mouth daily.   vitamin B-12 (CYANOCOBALAMIN) 1000 MCG tablet Take 1,000 mcg by mouth daily.   VITAMIN D PO Take 2 each by mouth daily.   No facility-administered encounter medications on file as of 12/23/2023.    Allergies (verified) Acetaminophen, Asa [aspirin], Ibuprofen, and Other   History: Past Medical History:  Diagnosis Date   Allergy    Arthritis    Breast cancer (HCC)    right side 07/2021   Cancer (HCC)    CRD (chronic renal disease)    stage 3   Depression    Diabetes mellitus without complication (HCC)    GERD (gastroesophageal reflux disease)    Hyperlipidemia    Hypertension    LBBB (left bundle branch block)    a. diagnosed in 10/2017 - echo showed a preserved EF of 55-60% and NST was low-risk showing no evidence of ischemia.    Osteoporosis    polyps in colon    Thyroid disease    Urinary tract infection    Past Surgical History:  Procedure Laterality Date   ABDOMINAL HYSTERECTOMY     APPENDECTOMY     BREAST BIOPSY Right    BREAST LUMPECTOMY Right 07/2021   BREAST LUMPECTOMY WITH RADIOACTIVE SEED AND SENTINEL LYMPH NODE BIOPSY Right 07/24/2021   Procedure: RIGHT BREAST LUMPECTOMY WITH RADIOACTIVE SEED AND SENTINEL LYMPH NODE BIOPSY;  Surgeon: Griselda Miner, MD;  Location: [facility];  Service: General;  Laterality: Right;   CARPAL TUNNEL RELEASE Right 11/14/2022   Procedure: CARPAL TUNNEL RELEASE;  Surgeon: Marlyne Beards, MD;  Location: [facility];  Service: Orthopedics;  Laterality: Right;  or MAC with local   Family History  Problem Relation Age of Onset   Alzheimer's disease Mother    Hyperlipidemia Mother    Hypertension Mother    Asthma Maternal Grandmother     Diabetes Maternal Grandfather    Alcohol abuse Maternal Grandfather    Hyperlipidemia Maternal Grandfather    Stroke Maternal Grandfather    Hypertension Maternal Grandfather    Diabetes Paternal Grandmother    Alcohol abuse Maternal Uncle    Cholecystitis Maternal Uncle    Arthritis Maternal Uncle    Social History   Socioeconomic History   Marital status: Widowed    Spouse name: Not on file   Number of children: Not on file   Years of education: Not on file   Highest education level: Bachelor's degree (e.g., BA, AB, BS)  Occupational History   Not on  file  Tobacco Use   Smoking status: Former    Current packs/day: 1.00    Average packs/day: 1 pack/day for 15.0 years (15.0 ttl pk-yrs)    Types: Cigarettes   Smokeless tobacco: Never  Vaping Use   Vaping status: Never Used  Substance and Sexual Activity   Alcohol use: Yes    Alcohol/week: 0.0 standard drinks of alcohol    Comment: occ glass of wine or mix drink   Drug use: No   Sexual activity: Not Currently  Other Topics Concern   Not on file  Social History Narrative   Moved from [place] 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    Social Drivers of Health   Financial Resource Strain: Low Risk  (12/23/2023)   Overall Financial Resource Strain (CARDIA)    Difficulty of Paying Living Expenses: Not hard at all  Food Insecurity: No Food Insecurity (12/23/2023)   Hunger Vital Sign    Worried About Running Out of Food in the Last Year: Never true    Ran Out of Food in the Last Year: Never true  Transportation Needs: No Transportation Needs (12/23/2023)   PRAPARE - Administrator, Civil Service (Medical): No    Lack of Transportation (Non-Medical): No  Physical Activity: Insufficiently Active (12/23/2023)   Exercise Vital Sign    Days of Exercise per Week: 3 days    Minutes of Exercise per Session: 30 min  Stress: Stress Concern Present (12/23/2023)   [employer] of [employer]ational [employer] - Occupational Stress Questionnaire    Feeling of Stress : To some extent  Social Connections: Moderately Isolated (12/23/2023)   Social Connection and Isolation Panel [NHANES]    Frequency of Communication with Friends and Family: More than three times a week    Frequency of Social Gatherings with Friends and Family: Three times a week    Attends Religious Services: More than 4 times per year    Active Member of Clubs or Organizations: No    Attends Banker Meetings: Never    Marital Status: Widowed    Tobacco Counseling Counseling given: Not Answered   Clinical Intake:  Pre-visit preparation completed: Yes  Pain : No/denies pain    BMI - recorded: 23.96 Nutritional Status: BMI of 19-24  Normal Nutritional Risks: None Diabetes: Yes CBG done?: Yes (BS 135 this am at home) CBG resulted in Enter/ Edit results?: No Did pt. bring in CBG monitor from home?: No  How often do you need to have someone help you when you read instructions, pamphlets, or other written materials from your doctor or pharmacy?: 1 - Never  Interpreter Needed?: No  Comments: lives alone Information entered by :: B.Marielys Trinidad,LPN   Activities of Daily Living     12/18/2023   11:01 AM  In your present state of health, do you have any difficulty performing the following activities:  Hearing? 1  Vision? 1  Difficulty concentrating or making decisions? 0  Walking or climbing stairs? 0  Dressing or bathing? 0  Doing errands, shopping? 0  Preparing Food and eating ? N  Using the Toilet? N  In the past six months, have you accidently leaked urine? N  Do you have problems with loss of bowel control? N  Managing your Medications? N  Managing your Finances? N  Housekeeping or managing your Housekeeping? N    Patient Care Team: Excell Seltzer, MD as PCP - General (Family Medicine) Sharman Cheek,  DMD as Consulting Physician (Dentistry) [name], MD as  Consulting Physician (Hematology and Oncology) Kathyrn Sheriff, [facility] (Inactive) as Pharmacist (Pharmacist) Griselda Miner, MD as Consulting Physician (General Surgery) Dorothy Puffer, MD as Consulting Physician (Radiation Oncology) Shon Millet, MD as Consulting Physician (Ophthalmology)  Indicate any recent Medical Services you may have received from other than [facility] providers in the past year (date may be approximate).     Assessment:   This is a routine wellness examination for Danise.  Hearing/Vision screen Hearing Screening - Comments:: Pt says her hearing is not good: new hearing aids coming Monday Says 40 and 31% loss. Vision Screening - Comments:: Pt says her vision is good w/glasses [facility]   Goals Addressed             This Visit's Progress    Monitor and Manage My Blood Sugar-Diabetes Type 2   On track    Timeframe:  Long-Range Goal Priority:  Medium Start Date:    01/16/22                         Expected End Date:    01/17/23                   Follow Up Date Aug 2023   - check blood sugar at prescribed times using Freestyle Libre  - take the blood sugar meter to all doctor visits    Why is this important?   Checking your blood sugar at home helps to keep it from getting very high or very low.  Writing the results in a diary or log helps the doctor know how to care for you.  Your blood sugar log should have the time, date and the results.  Also, write down the amount of insulin or other medicine that you take.  Other information, like what you ate, exercise done and how you were feeling, will also be helpful.     Notes:      Patient Stated   On track    12/23/23-still working:Get off some medication and lose 30 lbs.       Depression Screen     12/23/2023    9:44 AM 07/23/2023   11:57 AM 04/02/2023    8:40 AM 12/20/2022    8:26 AM 12/17/2022    9:37 AM 12/15/2021    9:10 AM 06/15/2021    8:55 AM  PHQ 2/9 Scores  PHQ - 2 Score 0 3 2 2 1  4 2   PHQ- 9 Score  9 7 8  9 8     Fall Risk     12/18/2023   11:01 AM 12/17/2022    9:39 AM 12/15/2021    8:27 AM 11/24/2020    8:26 AM 08/28/2019   10:12 AM  Fall Risk   Falls in the past year? 0 0 0 1 1  Number falls in past yr:  0  1 1  Injury with Fall?  0  1 0  Risk for fall due to : No Fall Risks No Fall Risks   History of fall(s)  Follow up Education provided;Falls prevention discussed Falls prevention discussed;Falls evaluation completed   Falls evaluation completed;Falls prevention discussed    MEDICARE RISK AT HOME:  Medicare Risk at Home Any stairs in or around the home?: (Patient-Rptd) Yes If so, are there any without handrails?: (Patient-Rptd) No Home free of loose throw rugs in walkways, pet beds, electrical  cords, etc?: (Patient-Rptd) Yes Adequate lighting in your home to reduce risk of falls?: (Patient-Rptd) Yes Life alert?: (Patient-Rptd) No Use of a cane, walker or w/c?: (Patient-Rptd) No Grab bars in the bathroom?: (Patient-Rptd) Yes Shower chair or bench in shower?: (Patient-Rptd) Yes Elevated toilet seat or a handicapped toilet?: (Patient-Rptd) No  TIMED UP AND GO:  Was the test performed?  No  Cognitive Function: 6CIT completed    08/28/2019   10:16 AM 08/20/2018   10:21 AM 05/09/2017    9:15 AM  MMSE - Mini Mental State Exam  Orientation to time 5 5 5   Orientation to Place 5 5 5   Registration 3 3 3   Attention/ Calculation 5 0 0  Recall 3 3 3   Language- name 2 objects  0 0  Language- repeat 1 1 1   Language- follow 3 step command  3 3  Language- read & follow direction  0 0  Write a sentence  0 0  Copy design  0 0  Total score  20 20        12/23/2023    9:49 AM 12/17/2022    9:40 AM  6CIT Screen  What Year? 0 points 0 points  What month? 0 points 0 points  What time? 0 points 0 points  Count back from 20 0 points 0 points  Months in reverse 0 points 0 points  Repeat phrase 0 points 0 points  Total Score 0 points 0 points     Immunizations Immunization History  Administered Date(s) Administered   Fluad Quad(high Dose 65+) 07/17/2019, 10/03/2020, 06/15/2021   Influenza, High Dose Seasonal PF 07/16/2017, 08/01/2018, 07/30/2022   Influenza,inj,Quad PF,6+ Mos 06/05/2016   Moderna Sars-Covid-2 Vaccination 07/08/2020, 08/06/2020   PPD Test 12/26/2016   Pneumococcal Conjugate-13 02/25/2014   Pneumococcal Polysaccharide-23 08/04/2009, 05/08/2016   Tdap 06/05/2016   Zoster, Live 08/13/2012    Screening Tests Health Maintenance  Topic Date Due   Zoster Vaccines- Shingrix (1 of 2) 02/23/1968   COVID-19 Vaccine (3 - Moderna risk series) 09/03/2020   INFLUENZA VACCINE  01/06/2024 (Originally 05/09/2023)   OPHTHALMOLOGY EXAM  12/24/2023   HEMOGLOBIN A1C  06/19/2024   FOOT EXAM  07/22/2024   Diabetic kidney evaluation - eGFR measurement  12/17/2024   Diabetic kidney evaluation - Urine ACR  12/17/2024   Medicare Annual Wellness (AWV)  12/22/2024   Colonoscopy  03/23/2025   MAMMOGRAM  06/26/2025   DEXA SCAN  06/26/2025   DTaP/Tdap/Td (2 - Td or Tdap) 06/05/2026   Pneumonia Vaccine 68+ Years old  Completed   Hepatitis C Screening  Completed   HPV VACCINES  Aged Out    Health Maintenance  Health Maintenance Due  Topic Date Due   Zoster Vaccines- Shingrix (1 of 2) 02/23/1968   COVID-19 Vaccine (3 - Moderna risk series) 09/03/2020   Health Maintenance Items Addressed: none needed  Additional Screening:  Vision Screening: Recommended annual ophthalmology exams for early detection of glaucoma and other disorders of the eye.  Dental Screening: Recommended annual dental exams for proper oral hygiene  Community Resource Referral / Chronic Care Management: CRR required this visit?  No   CCM required this visit?  No     Plan:     I have personally reviewed and noted the following in the patient's chart:   Medical and social history Use of alcohol, tobacco or illicit drugs  Current medications and  supplements including opioid prescriptions. Patient is not currently taking opioid prescriptions. Functional ability and status Nutritional  status Physical activity Advanced directives List of other physicians Hospitalizations, surgeries, and ER visits in previous 12 months Vitals Screenings to include cognitive, depression, and falls Referrals and appointments  In addition, I have reviewed and discussed with patient certain preventive protocols, quality metrics, and best practice recommendations. A written personalized care plan for preventive services as well as general preventive health recommendations were provided to patient.    Sue Lush, LPN   4/78/2956   After Visit Summary: (MyChart) Due to this being a telephonic visit, the after visit summary with patients personalized plan was offered to patient via MyChart   Notes: Nothing significant to report at this time.

## 2023-12-23 NOTE — Patient Instructions (Signed)
 April Ross , Thank you for taking time to come for your Medicare Wellness Visit. I appreciate your ongoing commitment to your health goals. Please review the following plan we discussed and let me know if I can assist you in the future.   Referrals/Orders/Follow-Ups/Clinician Recommendations: none  This is a list of the screening recommended for you and due dates:  Health Maintenance  Topic Date Due   Zoster (Shingles) Vaccine (1 of 2) 02/23/1968   COVID-19 Vaccine (3 - Moderna risk series) 09/03/2020   Flu Shot  01/06/2024*   Eye exam for diabetics  12/24/2023   Hemoglobin A1C  06/19/2024   Complete foot exam   07/22/2024   Yearly kidney function blood test for diabetes  12/17/2024   Yearly kidney health urinalysis for diabetes  12/17/2024   Medicare Annual Wellness Visit  12/22/2024   Colon Cancer Screening  03/23/2025   Mammogram  06/26/2025   DEXA scan (bone density measurement)  06/26/2025   DTaP/Tdap/Td vaccine (2 - Td or Tdap) 06/05/2026   Pneumonia Vaccine  Completed   Hepatitis C Screening  Completed   HPV Vaccine  Aged Out  *Topic was postponed. The date shown is not the original due date.    Advanced directives: (Copy Requested) Please bring a copy of your health care power of attorney and living will to the office to be added to your chart at your convenience. You can mail to [facility] [address] [place], [place] [address] or email to [email or web address]  Next Medicare Annual Wellness Visit scheduled for next year: Yes 12/23/24 @ 9:30am televisit

## 2023-12-24 ENCOUNTER — Telehealth: Payer: Self-pay | Admitting: Family Medicine

## 2023-12-24 NOTE — Telephone Encounter (Signed)
 Will discuss microalbumin results at patient's appointment tomorrow.

## 2023-12-25 ENCOUNTER — Ambulatory Visit (INDEPENDENT_AMBULATORY_CARE_PROVIDER_SITE_OTHER): Payer: PPO | Admitting: Family Medicine

## 2023-12-25 ENCOUNTER — Encounter: Payer: Self-pay | Admitting: Family Medicine

## 2023-12-25 ENCOUNTER — Encounter: Payer: Self-pay | Admitting: Pharmacist

## 2023-12-25 VITALS — BP 126/64 | HR 61 | Temp 98.9°F | Ht 61.81 in | Wt 132.6 lb

## 2023-12-25 DIAGNOSIS — I447 Left bundle-branch block, unspecified: Secondary | ICD-10-CM | POA: Insufficient documentation

## 2023-12-25 DIAGNOSIS — E1169 Type 2 diabetes mellitus with other specified complication: Secondary | ICD-10-CM

## 2023-12-25 DIAGNOSIS — E1121 Type 2 diabetes mellitus with diabetic nephropathy: Secondary | ICD-10-CM

## 2023-12-25 DIAGNOSIS — M810 Age-related osteoporosis without current pathological fracture: Secondary | ICD-10-CM | POA: Diagnosis not present

## 2023-12-25 DIAGNOSIS — R002 Palpitations: Secondary | ICD-10-CM | POA: Diagnosis not present

## 2023-12-25 DIAGNOSIS — Z0000 Encounter for general adult medical examination without abnormal findings: Secondary | ICD-10-CM | POA: Diagnosis not present

## 2023-12-25 DIAGNOSIS — E1159 Type 2 diabetes mellitus with other circulatory complications: Secondary | ICD-10-CM

## 2023-12-25 DIAGNOSIS — R809 Proteinuria, unspecified: Secondary | ICD-10-CM | POA: Diagnosis not present

## 2023-12-25 DIAGNOSIS — E785 Hyperlipidemia, unspecified: Secondary | ICD-10-CM

## 2023-12-25 DIAGNOSIS — N1831 Chronic kidney disease, stage 3a: Secondary | ICD-10-CM | POA: Diagnosis not present

## 2023-12-25 DIAGNOSIS — I152 Hypertension secondary to endocrine disorders: Secondary | ICD-10-CM | POA: Diagnosis not present

## 2023-12-25 DIAGNOSIS — F321 Major depressive disorder, single episode, moderate: Secondary | ICD-10-CM

## 2023-12-25 DIAGNOSIS — E039 Hypothyroidism, unspecified: Secondary | ICD-10-CM | POA: Diagnosis not present

## 2023-12-25 DIAGNOSIS — M3501 Sicca syndrome with keratoconjunctivitis: Secondary | ICD-10-CM | POA: Insufficient documentation

## 2023-12-25 DIAGNOSIS — E11319 Type 2 diabetes mellitus with unspecified diabetic retinopathy without macular edema: Secondary | ICD-10-CM

## 2023-12-25 NOTE — Assessment & Plan Note (Signed)
 Acute, ongoing last several months.  Infrequent and asymptomatic during spells. EKG unchanged from previous in January 2024 showing left bundle branch block, normal sinus rhythm. Encouraged her to decrease caffeine, keep up with water continue to avoid decongestants, alcohol.  Encouraged her to work on stress reduction.  If her symptoms become more frequent or symptomatic she will return to be set up with ZIO monitor and for further lab reevaluation.  Of note 6 months ago thyroid tests were in the normal range.

## 2023-12-25 NOTE — Progress Notes (Unsigned)
 Application placed in Dr. Ermalene Searing office in box for signature.

## 2023-12-25 NOTE — Assessment & Plan Note (Signed)
Chronic, moderate control ? ? Not interested in counseling or medication to treat. ?

## 2023-12-25 NOTE — Assessment & Plan Note (Signed)
 Referral to Kidney specialist given new dx of sjogren's for any additional recommendations and consideration of kindney involvement.  Likely due to DM and HTN Plans: . Start SGLt2, already on ARB and  GLP1.

## 2023-12-25 NOTE — Assessment & Plan Note (Signed)
Stable, chronic.  Continue current medication.  Continue amlodipine 5 mg p.o. daily and losartan 100 mg p.o. daily 

## 2023-12-25 NOTE — Assessment & Plan Note (Signed)
 Recent Dx, with rheumatology Dr. Nickola Major.

## 2023-12-25 NOTE — Assessment & Plan Note (Signed)
 Present of all EKGs since 2019, not new issue.  ECHO 2021 mild diastolic dysfunction  Stress test 2019  low risk

## 2023-12-25 NOTE — Assessment & Plan Note (Addendum)
 Chronic,  slight worsening control despite semaglutide  2 mg weekly .   Glipizide causing hypoglycemia.Marland Kitchen no longer using. Discussed with Ashley Akin, pharmacy regarding addition of SGLT2 medication she will look into [place] as this may be covered for the patient and will add additional kidney protective benefit.   Follow-up for reevaluation in 3 months with point-of-care A1c

## 2023-12-25 NOTE — Assessment & Plan Note (Addendum)
 She has a patient that was affected by the abnormal software calculation of UACR.  Repeat  microalbumin  again shows abnormal  UACR  she is a candidate for SGLT2i  She is already on losartan.

## 2023-12-25 NOTE — Progress Notes (Addendum)
 Patient ID: April Ross, female    DOB: 1950-10-27, 73 y.o.   MRN: [id number]  This visit was conducted in person.  BP 126/64   Pulse 61   Temp 98.9 F (37.2 C) (Oral)   Ht 5' 1.81" (1.57 m)   Wt 132 lb 9.6 oz (60.1 kg)   SpO2 97%   BMI 24.40 kg/m    CC:  Chief Complaint  Patient presents with   Annual Exam    A1C concerns   Palpitations    Patient states that periodically feels like her heart is racing   ingrown toe nail    Subjective:   HPI: April Ross is a 73 y.o. female presenting on 12/25/2023 for Annual Exam (A1C concerns), Palpitations (Patient states that periodically feels like her heart is racing), and ingrown toe nail  The patient presents for complete physical and review of chronic health problems. He/She also has the following acute concerns today: Palpitations, intermittent off and on  in last 2-3 months... not often, about ever 2 weeks.  Resolves after  minutes, deeps breaths.  No associated CP, SOB, weakness, dizziness.  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  Some trouble sleeping at night.   Ingrown nail    Has again quit smoking 12/07/2023    Dr. Nickola Major.Marland Kitchen dx  with probably Sjogren's  Flowsheet Row Office Visit from 12/25/2023 in [facility]  PHQ-2 Total Score 2      Diabetes:     Slight worsened control  on  and  Ozempic 2 mg weekly ( in last 6 weeks with pharmacist ). Glipizide  not using at all.  She is eating a very low carb diet. Lab Results  Component Value Date   HGBA1C 6.9 (H) 12/18/2023  Using medications without difficulties: SE of constipation Hypoglycemic episodes: none Hyperglycemic episodes: occ Feet problems: none Blood Sugars averaging:  Using continuous glucose monitor.FBS  120-160 eye exam within last year:  Wt Readings from Last 3 Encounters:  12/25/23 132 lb 9.6 oz (60.1 kg)  12/23/23 131 lb (59.4 kg)  11/18/23 131 lb 6 oz  (59.6 kg)    Hypertension:  good control in office on amlodipine and losartan. BP Readings from Last 3 Encounters:  12/25/23 126/64  09/19/23 130/69  08/12/23 (!) 141/66  Using medication without problems or lightheadedness:  none Chest pain with exertion: none Edema: none Short of breath: none Average home BPs: Other issues:   Elevated Cholesterol: LDL at goal on   crestor 20 mg daily . Lab Results  Component Value Date   CHOL 162 12/18/2023   HDL 37.40 (L) 12/18/2023   LDLCALC 82 12/18/2023   LDLDIRECT 95.0 12/13/2022   TRIG 214.0 (H) 12/18/2023   CHOLHDL 4 12/18/2023  Using medications without problems: none Muscle aches:  none Diet compliance: she is eating healthy, low carbs. Exercise: walking 2-3 times a week. Other complaints:   Hypothyroid:  stable control on levo 88 mcg daily Lab Results  Component Value Date   TSH 0.31 (L) 04/02/2023    Breast cancer: active treatment on letrozole. Oncology note from 08/2022 reviewed. Dr. Pamelia Hoit  MDD, moderate control: Chronic insomnia: Hs SE to trazodone in past.  She is not interested in medication  to treat.  Relevant past medical, surgical, family and social history reviewed and updated as indicated. Interim medical history since our last visit reviewed.  Allergies and medications reviewed and updated. Outpatient Medications Prior to Visit  Medication Sig Dispense Refill   Alcohol Swabs (B-D SINGLE USE SWABS REGULAR) PADS Use to check blood sugar up to 2 times a day 100 each 5   alendronate (FOSAMAX) 70 MG tablet Take 1 tablet (70 mg total) by mouth every 7 (seven) days. Take with a full glass of water on an empty stomach. 4 tablet 11   amLODipine (NORVASC) 5 MG tablet TAKE 1 TABLET (5 MG TOTAL) BY MOUTH DAILY. 90 tablet 3   APPLE CIDER VINEGAR PO Take by mouth.     Blood Glucose Monitoring Suppl (ONETOUCH VERIO REFLECT) w/Device KIT Use to check blood sugar up to 2 times a day 1 kit 0   cetirizine (ZYRTEC) 10 MG tablet  Take 10 mg by mouth at bedtime as needed for allergies.     CINNAMON PO Take 1 tablet by mouth daily.     Continuous Glucose Sensor (FREESTYLE LIBRE 3 PLUS SENSOR) MISC Use to check glucose continuously. Change sensor every 15 days. 2 each 11   gabapentin (NEURONTIN) 100 MG capsule Take 100 mg by mouth 3 (three) times daily as needed.     glucose blood (ONETOUCH VERIO) test strip Use to check blood sugar up to 2 times a day 100 each 5   Lancets (ONETOUCH DELICA PLUS LANCET30G) MISC Use to check blood sugar up to 2 times a day 100 each 5   letrozole (FEMARA) 2.5 MG tablet TAKE 1 TABLET BY MOUTH EVERY DAY 90 tablet 3   levothyroxine (SYNTHROID) 88 MCG tablet TAKE 1 TABLET BY MOUTH EVERY DAY 90 tablet 3   losartan (COZAAR) 100 MG tablet TAKE 1 TABLET BY MOUTH EVERY DAY 90 tablet 3   NON FORMULARY Ginsing root     Omega-3 Fatty Acids (FISH OIL OMEGA-3 PO) Take 1 capsule by mouth daily.     OVER THE COUNTER MEDICATION Liver gallbladder support tablets     OVER THE COUNTER MEDICATION Leg and back pain relief tablets     PREBIOTIC PRODUCT PO Take by mouth.     rosuvastatin (CRESTOR) 20 MG tablet TAKE 1 TABLET BY MOUTH EVERY DAY 90 tablet 3   Semaglutide, 1 MG/DOSE, 4 MG/3ML SOPN Inject 1 mg as directed once a week. Patient Assistance.     tiZANidine (ZANAFLEX) 4 MG tablet TAKE 1 TABLET BY MOUTH AT BEDTIME AS NEEDED FOR MUSCLE SPASMS 90 tablet 0   traMADol (ULTRAM) 50 MG tablet TAKE 1 TABLET BY MOUTH EVERY DAY AS NEEDED 30 tablet 0   Turmeric (QC TUMERIC COMPLEX PO) Take 1 tablet by mouth daily.     vitamin B-12 (CYANOCOBALAMIN) 1000 MCG tablet Take 1,000 mcg by mouth daily.     VITAMIN D PO Take 2 each by mouth daily.     No facility-administered medications prior to visit.     Per HPI unless specifically indicated in ROS section below Review of Systems  Constitutional:  Negative for fatigue and fever.  HENT:  Negative for congestion.   Eyes:  Negative for pain.  Respiratory:  Negative for  cough and shortness of breath.   Cardiovascular:  Negative for chest pain, palpitations and leg swelling.       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  Genitourinary:  Negative for dysuria and vaginal bleeding.  Musculoskeletal:  Negative for back pain.  Neurological:  Negative for syncope, light-headedness and headaches.  Psychiatric/Behavioral:  Negative for  dysphoric mood.    Objective:  BP 126/64   Pulse 61   Temp 98.9 F (37.2 C) (Oral)   Ht 5' 1.81" (1.57 m)   Wt 132 lb 9.6 oz (60.1 kg)   SpO2 97%   BMI 24.40 kg/m   Wt Readings from Last 3 Encounters:  12/25/23 132 lb 9.6 oz (60.1 kg)  12/23/23 131 lb (59.4 kg)  11/18/23 131 lb 6 oz (59.6 kg)      Physical Exam Constitutional:      General: She is not in acute distress.    Appearance: Normal appearance. She is well-developed. She is not ill-appearing or toxic-appearing.  HENT:     Head: Normocephalic.     Right Ear: Hearing, tympanic membrane, ear canal and external ear normal. Tympanic membrane is not erythematous, retracted or bulging.     Left Ear: Hearing, tympanic membrane, ear canal and external ear normal. Tympanic membrane is not erythematous, retracted or bulging.     Nose: No mucosal edema or rhinorrhea.     Right Sinus: No maxillary sinus tenderness or frontal sinus tenderness.     Left Sinus: No maxillary sinus tenderness or frontal sinus tenderness.     Mouth/Throat:     Pharynx: Uvula midline.  Eyes:     General: Lids are normal. Lids are everted, no foreign bodies appreciated.     Conjunctiva/sclera: Conjunctivae normal.     Pupils: Pupils are equal, round, and reactive to light.  Neck:     Thyroid: No thyroid mass or thyromegaly.     Vascular: No carotid bruit.     Trachea: Trachea normal.  Cardiovascular:     Rate and Rhythm: Normal rate and regular rhythm.     Pulses: Normal pulses.     Heart sounds: Normal heart sounds, S1 normal and S2  normal. No murmur heard.    No friction rub. No gallop.  Pulmonary:     Effort: Pulmonary effort is normal. No tachypnea or respiratory distress.     Breath sounds: Normal breath sounds. No decreased breath sounds, wheezing, rhonchi or rales.  Abdominal:     General: Bowel sounds are normal.     Palpations: Abdomen is soft.     Tenderness: There is no abdominal tenderness.  Musculoskeletal:     Cervical back: Normal range of motion and neck supple.  Skin:    General: Skin is warm and dry.     Findings: No Tout.  Neurological:     Mental Status: She is alert.  Psychiatric:        Mood and Affect: Mood is not anxious or depressed.        Speech: Speech normal.        Behavior: Behavior normal. Behavior is cooperative.        Thought Content: Thought content normal.        Judgment: Judgment normal.       Results for orders placed or performed in visit on 12/18/23  Microalbumin / creatinine urine ratio   Collection Time: 12/18/23  8:26 AM  Result Value Ref Range   Microalb, Ur 61.6 (H) 0.0 - 1.9 mg/dL   Creatinine,U 324.4 mg/dL   Microalb Creat Ratio 492.5 (H) 0.0 - 30.0 mg/g  Lipid panel   Collection Time: 12/18/23  8:26 AM  Result Value Ref Range   Cholesterol 162 0 - 200 mg/dL   Triglycerides 010.2 (H) 0.0 - 149.0 mg/dL   HDL 72.53 (L) >66.44 mg/dL   VLDL 03.4 (  H) 0.0 - 40.0 mg/dL   LDL Cholesterol 82 0 - 99 mg/dL   Total CHOL/HDL Ratio 4    NonHDL 124.87   Hemoglobin A1c   Collection Time: 12/18/23  8:26 AM  Result Value Ref Range   Hgb A1c MFr Bld 6.9 (H) 4.6 - 6.5 %  Comprehensive metabolic panel   Collection Time: 12/18/23  8:26 AM  Result Value Ref Range   Sodium 139 135 - 145 mEq/L   Potassium 4.2 3.5 - 5.1 mEq/L   Chloride 105 96 - 112 mEq/L   CO2 27 19 - 32 mEq/L   Glucose, Bld 182 (H) 70 - 99 mg/dL   BUN 21 6 - 23 mg/dL   Creatinine, Ser 1.30 (H) 0.40 - 1.20 mg/dL   Total Bilirubin 0.6 0.2 - 1.2 mg/dL   Alkaline Phosphatase 43 39 - 117 U/L   AST 26 0  - 37 U/L   ALT 40 (H) 0 - 35 U/L   Total Protein 6.8 6.0 - 8.3 g/dL   Albumin 4.6 3.5 - 5.2 g/dL   GFR 86.57 (L) >84.69 mL/min   Calcium 9.9 8.4 - 10.5 mg/dL    This visit occurred during the SARS-CoV-2 public health emergency.  Safety protocols were in place, including screening questions prior to the visit, additional usage of staff PPE, and extensive cleaning of exam room while observing appropriate contact time as indicated for disinfecting solutions.   COVID 19 screen:  No recent travel or known exposure to COVID19 The patient denies respiratory symptoms of COVID 19 at this time. The importance of social distancing was discussed today.   Assessment and Plan   The patient's preventative maintenance and recommended screening tests for an annual wellness exam were reviewed in full today. Brought up to date unless services declined.  Counselled on the importance of diet, exercise, and its role in overall health and mortality. The patient's FH and SH was reviewed, including their home life, tobacco status, and drug and alcohol status.   Uptodate with PNA and flu vaccine, COVID x2 Consider shingrix.  Former smoker:   Does not qualify lung cancer screening test S/P hysterectomy.  Mammo: 06/2023   05/2019 DEXA..  Improved on fosamax. stable 04/2021 stopped fosamax .. 06/2023 T-2.6, slight worsening .Marland Kitchen Back on fosamax. No SE.   EKG: normal EKG, normal sinus rhythm, unchanged from previous tracings 2024, LBBB noted bith    Problem List Items Addressed This Visit     CKD (chronic kidney disease) stage 3, GFR 30-59 ml/min (HCC)    Referral to Kidney specialist given new dx of sjogren's for any additional recommendations and consideration of kindney involvement.  Likely due to DM and HTN Plans: . Start SGLt2, already on ARB and  GLP1.       Relevant Orders   Ambulatory referral to Nephrology   Diabetic retinopathy (HCC)   Hyperlipidemia associated with type 2 diabetes mellitus (HCC)    Chronic, LDL at goal less than 100 on Crestor 20 mg p.o. daily      Hypertension associated with diabetes (HCC)   Stable, chronic.  Continue current medication.  Continue amlodipine 5 mg p.o. daily and losartan 100 mg p.o. daily      Hypothyroidism    Chronic, good   On 88 mcg of levothyroxine      LBBB (left bundle branch block)    Present of all EKGs since 2019, not new issue.  ECHO 2021 mild diastolic dysfunction  Stress test 2019  low risk      Microalbuminuria   She has a patient that was affected by the abnormal software calculation of UACR.  Repeat  microalbumin  again shows abnormal  UACR  she is a candidate for SGLT2i  She is already on losartan.      Relevant Orders   Ambulatory referral to Nephrology   Moderate major depression, single episode (HCC)   Chronic, moderate control   Not interested in counseling or medication to treat.      Osteoporosis   Chronic, on calcium and vitamin D.  She is currently on letrozole for breast cancer treatment.  She has approximately 3 more years left of this medication. In the past she was very responsive to Fosamax with improvement in her bone density.   She has restarted this (she was off for 2 years as instructed) and repeat bone density in 2 years. Also did discuss possibility of considering a medication like Prolia every 6 months to stimulate bone growth and she can discuss this with her oncologist as well.      Palpitations - Primary   Acute, ongoing last several months.  Infrequent and asymptomatic during spells. EKG unchanged from previous in January 2024 showing left bundle branch block, normal sinus rhythm. Encouraged her to decrease caffeine, keep up with water continue to avoid decongestants, alcohol.  Encouraged her to work on stress reduction.  If her symptoms become more frequent or symptomatic she will return to be set up with ZIO monitor and for further lab reevaluation.  Of note 6 months ago thyroid tests  were in the normal range.      Relevant Orders   EKG 12-Lead (Completed)   Sjogren's syndrome with keratoconjunctivitis sicca (HCC)    Recent Dx, with rheumatology Dr. Nickola Major.        Relevant Orders   Ambulatory referral to Nephrology   Type 2 diabetes mellitus with nephropathy (HCC)   Chronic,  slight worsening control despite semaglutide  2 mg weekly .   Glipizide causing hypoglycemia.Marland Kitchen no longer using. Discussed with Ashley Akin, pharmacy regarding addition of SGLT2 medication she will look into [place] as this may be covered for the patient and will add additional kidney protective benefit.   Follow-up for reevaluation in 3 months with point-of-care A1c       Relevant Orders   Ambulatory referral to Nephrology   Other Visit Diagnoses       Routine general medical examination at a health care facility           Kerby Nora, MD

## 2023-12-25 NOTE — Assessment & Plan Note (Signed)
 Chronic, good   On 88 mcg of levothyroxine

## 2023-12-25 NOTE — Assessment & Plan Note (Addendum)
 Chronic, on calcium and vitamin D.  She is currently on letrozole for breast cancer treatment.  She has approximately 3 more years left of this medication. In the past she was very responsive to Fosamax with improvement in her bone density.   She has restarted this (she was off for 2 years as instructed) and repeat bone density in 2 years. Also did discuss possibility of considering a medication like Prolia every 6 months to stimulate bone growth and she can discuss this with her oncologist as well.

## 2023-12-25 NOTE — Progress Notes (Signed)
 Patient Assistance Program (PAP) Application   Manufacturer: [employer] (AZ&Me)    (New enrollment) Medication(s): Farxiga 10 mg tablet  Patient Portion of Application:  3/19: Filled out and placed in front office for patient signature.  Income Documentation: N/A - Electronic verification elected.  Provider Portion of Application:  3/19:  Provider page completed and kept with patient portion of application. PCP to sign once patient completes her signature. Prescription(s): Included in MAP application. Farxiga 10 mg once daily   Application Status: DENIED due to income (patient notes this was expected, did not feel she qualified)  Next Steps: [x]    Application filled out by PharmD - mailed to patient home per patient request.   [x]    Patient reports preference to mail signed application back to clinic [x]    Once received, front office to place application in PharmD folder - received 4/1 [x]    Provider page placed in [place] folder in front office. PCP to sign then return to PharmD on 4/1 [x]    PharmD will combine patient/provider pages and fax to AZ&Me Fax: [phone] AND scan into patient chart  Forwarded to [facility] CPhT Patient Advocate Team for future correspondences/re-enrollment.  Note routed to PCP Clinic Pool to ensure PCP signature is obtained and application is faxed.

## 2023-12-25 NOTE — Assessment & Plan Note (Signed)
Chronic, LDL at goal less than 100 on Crestor 20 mg p.o. daily 

## 2023-12-27 NOTE — Progress Notes (Unsigned)
 Completed physician page placed in Lindsay's folder up front.

## 2023-12-30 DIAGNOSIS — H903 Sensorineural hearing loss, bilateral: Secondary | ICD-10-CM | POA: Diagnosis not present

## 2024-01-01 ENCOUNTER — Other Ambulatory Visit: Payer: Self-pay | Admitting: Family Medicine

## 2024-01-01 DIAGNOSIS — E1121 Type 2 diabetes mellitus with diabetic nephropathy: Secondary | ICD-10-CM

## 2024-01-02 ENCOUNTER — Telehealth: Payer: Self-pay | Admitting: Family Medicine

## 2024-01-02 NOTE — Telephone Encounter (Signed)
 Received Az and Me forms back from patient in mail placed in [place] at front desk

## 2024-01-14 ENCOUNTER — Telehealth: Payer: Self-pay

## 2024-01-14 NOTE — Telephone Encounter (Signed)
 Gave pt a call to fill out application for Ozempic ,pt is coming up due on PAP [employer]) pt gave consent to fill application online today and will fax provider portion,pt was not sure if she would qualify due to not qualify for AZ&ME,

## 2024-01-16 ENCOUNTER — Other Ambulatory Visit: Payer: Self-pay | Admitting: Family Medicine

## 2024-01-20 NOTE — Telephone Encounter (Signed)
 Copied from CRM [id number]. Topic: General - Other >> Jan 20, 2024 10:38 AM Earnestine Goes B wrote: Reason for CRM: Pt called to speak with Pharmacist regarding Ozempic application. States she was contacted, was advised she is missing information on the application. Please call pt back at [phone]

## 2024-01-21 NOTE — Telephone Encounter (Signed)
 Following up with [employer], pt does not qualify due to higher income per [employer] spoke with pt to let her know that [employer] denied the application due to higher income,pt was already aware due to not qualifying for AZ&ME April Ross too.

## 2024-01-22 ENCOUNTER — Encounter: Payer: Self-pay | Admitting: Pharmacist

## 2024-01-22 NOTE — Telephone Encounter (Signed)
 Not a Pt here at [facility]. Will "done" since pts office is aware of this message.

## 2024-01-23 ENCOUNTER — Encounter: Payer: Self-pay | Admitting: Pharmacist

## 2024-01-23 NOTE — Progress Notes (Addendum)
 Patient Assistance Program (PAP) Application   Manufacturer: Novo Nordisk    (Re-enrollment) - Enrollment ends 02/09/2024 Medication(s): Ozempic  2.0 mg   Patient Portion of Application:  4/17: Completed with patient via online enrollment tool. Submitted.  Income Documentation: N/A - Electronic verification elected.  Provider Portion of Application:  4/17:  Re-completed online. Paper copy not received by Novo Prescription(s): Included in MAP application. Ozempic  2.0 mg (4 boxes)  01/31/24: - Called [employer] per "Missing Information" reported on automated system  - They report needing additional information regarding insurance.  - Provided Novo with [employer] insurance information per card in Media tab.  - MD SLN expired. Provided updated expiration per Medical Board:   Current enrollment: Expired 02/10/2024 Re-enrollment APPROVED - 02/10/24 to 02/04/2025

## 2024-02-05 ENCOUNTER — Other Ambulatory Visit: Payer: PPO | Admitting: Pharmacist

## 2024-02-05 ENCOUNTER — Other Ambulatory Visit: Payer: Self-pay | Admitting: Family Medicine

## 2024-02-05 DIAGNOSIS — E1122 Type 2 diabetes mellitus with diabetic chronic kidney disease: Secondary | ICD-10-CM

## 2024-02-05 MED ORDER — EMPAGLIFLOZIN 10 MG PO TABS
10.0000 mg | ORAL_TABLET | Freq: Every day | ORAL | 2 refills | Status: DC
Start: 2024-02-05 — End: 2024-04-08

## 2024-02-05 NOTE — Progress Notes (Unsigned)
 02/05/2024 Name: April Ross MRN: [id number] DOB: 09-01-1951  Subjective  Chief Complaint  Patient presents with   Diabetes    Reason for visit: April Ross is a 73 y.o. female who presented for a follow up telephone visit. They were referred to the pharmacist by their PCP for assistance in managing diabetes.   Pertinent PMH also includes CKD, HTN, HLD, hypothyroidism, osteoporosis, breast cancer.  Care Team: Primary Care Provider: Judithann Novas, MD  Known DM Complications: nephropathy   Date of Last Diabetes Related Visit: 09/18/23 with Pharmacist  Summary of recent visits: ?  SGLT2i PAP denied due to income. SGLT2i remains unaffordable.  12/11: ?? Ozempic  to 2 mg weekly (though has >4 months of 1 mg dose remaining. Wants to double up). Novo 2025 ap submitted for 2.0 mg.  11/14: A1c 6.3%, dec from 6.8%. No longer having night lows since d/c PM glipizide  though having afternoon lows. Stop AM glipizide . Libre 3+ Rx sent to pharmacy (on [address]) 10/17: Ongoing c/f overnight lows w A1c 6.3%. Continue Ozempic  1 mg, ?? glipizide  to 5 mg once daily (breakfast) 7/29: Pt self-?? glipizide  back to 10 mg BID due to seeing higher sugars. Plan: ?? Ozempic  to 1 mg weekly, ?? glipizide  to 5 mg BID. @f /u consider MAP for SGLT2 given CKD. Patient is amenable. Consider Y[place] ?Libre 3.  Prescription drug coverage: Payor: [employer] / Plan: [employer] PPO / Product Type: *No Product type* / .   Reports that all medications are affordable.  Current Patient Assistance: NovoNordisk (Ozempic ) Medication Adherence: Patient denies missing doses of their medication.     Since Last visit / History of Present Illness: ?  Patient reports implementing plan from last visit.  Notes BG have been remaining in normal range. Occasional highs with higher carb meals though never unexplained. States even a small amount of rice spikes sugars to 280 which is frusturatng but sugars always  come down within ~1 hour.   Thinks recent stress have impacted her BG somewhat. 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 Last 2 weeks, has had increased expenses given water line/electrical issue..   Ongoing low energy/lack of motivation. Poor sleep. Reads until bed and gets to sleep okay though wakes 1-3 am almost nightly. Takes 2-3 hours to get back to sleep. Hx of Ambien  though did not like the way it made her feel, similar with tradozone.   Official diagnosis of Sjogren rheumatology last month.  Reported DM Regimen: ?  Ozempic  2 mg sq once weekly (Sun)   DM medications tried in the past:?  metformin  (patient preference to d/c due to seeing bad press about it online) Trulicity  (access concerns/backorder)  SMBG  Libre 3 :    Hypo/Hyperglycemia: ?  Symptoms of hypoglycemia since last visit:? no. 2 instances of upper 60s on CGM.  Symptoms of hyperglycemia since last visit:? no   Exercise: None currently  DM Prevention:  Statin: Taking; high intensity.? History of chronic kidney disease? yes History of albuminuria? yes, last UACR on 12/18/23 = 492 mg/g ACE/ARB - Taking losartan  100 mg; Urine MA/CR Ratio - elevated urinary albumin excretion.  Last eye exam: 12/24/22; No retinopathy present Last foot exam: 07/23/2023 Tobacco Use: Former  Immunizations:? Flu: Due (Last: 07/30/2022); Pneumococcal: PPSV23 (2010, 05/2016) PCV13 (02/2014); Shingrix: No Record - DUE; Zostavax (08/2012); Covid (07/08/20, 08/06/20; No record of Booster)  Cardiovascular Risk Reduction History of clinical ASCVD? no The 10-year ASCVD risk score (Arnett DK, et al., 2019) is: 32.2% Prevent:  10-year risk of CVD = 32.7%; This individual has an estimated 10-year risk of Heart Failure = 22.4% History of heart failure? no  History of hyperlipidemia? yes Current BMI: 24.3 kg/m2 (Ht 62 in, Wt 60.1 kg) Taking statin? yes; high intensity (rosuvastatin  20 mg daily) Taking aspirin ? not indicated; Not taking   Taking SGLT-2i? no  (cost) Taking GLP- 1 RA? yes   Reported HTN Regimen: ?  Amlodipine  5 mg daily Losartan  100 mg daily  Patient takes their blood pressure medications in the morning Patient is not checking their blood pressure at home regularly. Patient denies hypotensive s/sx. No dizziness, lightheadedness.  Patient denies hypertensive symptoms. No headache, chest pain, shortness of breath, visual changes.    _______________________________________________  Objective    Review of Systems:?  Constitutional:? No fever, chills or unintentional weight loss  Cardiovascular:? No chest pain or pressure, shortness of breath, dyspnea on exertion, orthopnea or LE edema  Pulmonary:? No cough or shortness of breath  GI:? No nausea, vomiting, constipation, diarrhea, abdominal pain, dyspepsia, change in bowel habits  Endocrine:? No polyuria, polyphagia or blurred vision  Psych:? No depression, anxiety, insomnia  Physical Examination:  Vitals:  Wt Readings from Last 3 Encounters:  12/25/23 132 lb 9.6 oz (60.1 kg)  12/23/23 131 lb (59.4 kg)  11/18/23 131 lb 6 oz (59.6 kg)   BP Readings from Last 3 Encounters:  12/25/23 126/64  09/19/23 130/69  08/12/23 (!) 141/66   Pulse Readings from Last 3 Encounters:  12/25/23 61  08/12/23 75  07/23/23 73   Labs:?  Lab Results  Component Value Date   HGBA1C 6.9 (H) 12/18/2023   HGBA1C 6.3 (A) 07/23/2023   HGBA1C 6.8 (A) 04/02/2023   GLUCOSE 182 (H) 12/18/2023   MICRALBCREAT 492.5 (H) 12/18/2023   MICRALBCREAT 59.9 (H) 12/13/2022   MICRALBCREAT 4.0 11/19/2017   CREATININE 1.23 (H) 12/18/2023   CREATININE 1.23 (H) 12/13/2022   CREATININE 1.30 (H) 10/20/2022   GFR 43.20 (L) 12/18/2023   GFR 43.51 (L) 12/13/2022   GFR 41.23 (L) 06/14/2022   Lab Results  Component Value Date   CHOL 162 12/18/2023   LDLCALC 82 12/18/2023   LDLCALC 61 10/28/2017   LDLCALC 85 05/09/2017   LDLDIRECT 95.0 12/13/2022   HDL 37.40 (L) 12/18/2023   HDL 31.90 (L) 12/13/2022   HDL  30.10 (L) 06/14/2022   AST 26 12/18/2023   AST 21 12/13/2022   ALT 40 (H) 12/18/2023   ALT 29 12/13/2022     Chemistry      Component Value Date/Time   NA 139 12/18/2023 0826   K 4.2 12/18/2023 0826   CL 105 12/18/2023 0826   CO2 27 12/18/2023 0826   BUN 21 12/18/2023 0826   CREATININE 1.23 (H) 12/18/2023 0826      Component Value Date/Time   CALCIUM  9.9 12/18/2023 0826   ALKPHOS 43 12/18/2023 0826   AST 26 12/18/2023 0826   ALT 40 (H) 12/18/2023 0826   BILITOT 0.6 12/18/2023 0826     Assessment and Plan:   1. Diabetes, type 2: controlled per last A1c of 6.3% (07/23/23), decreased from previous 6.8% (04/02/23) with goal <7% without hypoglycemia. GMI 6.9%, no c/f lows. Novo automated system still indicates 1.0 mg despite 2.0 mg form previously submitted at end of 2025.  Current Regimen: Ozempic  2 mg sq weekly Re-uploaded Novo Dose change form to eFax folder for PCP signature, msgd PCP clinical pool to ensure signed/faxed.  Future Consideration: SGLT2i: SGLT2i may be beneficial for  renal protection especially in the setting of CKD with microalbuminuria if UACR remains elevated on repeat labs. Due for repeat UACR, baseline BMP also needed. Die for annual visit March.  Metformin : Took previously >20 years and was patient preference to discontinue due to fears seen online. Not ideal in the setting of her renal dysfunction.  TZD: Avoiding due to possible increase in fracture risk with better alternatives at this time.    2. CKD: Tolerating ARB well without concerns. Last UACR elevated at 59.9 mg/g (12/13/22), worsened from 4.0 mg/g in 2019 despite max dose of losartan . Patient is indicated for SGLT2i for prevention of CKD/albuminuria progression esp if UACR remains elevated on annual labs. Start Jardiance  10 mg daily Repeat BMP ~4 weeks (PCP f/u 6 weeks - labs prior)  Future consideration: Consider Start Farxiga or Jardiance  at CKD dosing: 10 mg once daily in the morning s/p routine  labs. F/u office visit not currently scheduled.  Msgd Clinical pool per patient request.   3. HTN: controlled based on last clinic BP of 130/66 mmHg (07/23/23), goal <130/80 mmHg. Does not monitor BP at home though has cuff if needed. Denies lightheadedness, dizziness, SOB, CP, vision changes.  Current Regimen: losartan  100 mg daily, amlodipine  5 mg daily  Continue medications without changes.    4. ASCVD (primary prevention): LDL  above goal  on last lipid panel with LDL 95 mg/dL, TG 034 mh/dL (04/10/24). LDL goal <70 mg/dL (primary prevention, diabetes). Given significantly elevated risk scores (below), would benefit from statin increase. Patient amenable though has been working hard toward improved diet. Reasonable to re-evaluate after repeat routine lipid panel in March.  Key risk factors include: diabetes, hypertension, hyperlipidemia, former smoker, and sedentary lifestyle The 10-year ASCVD risk score (Arnett DK, et al., 2019) is: 32.2% indicated patient is at very high risk.  PREVENT: 10-year risk of CVD = 32.7%; This individual has an estimated 10-year risk of Heart Failure = 22.4%  Current Regimen: rosuvastatin  20 mg daily Continue medications without changes.   Future consideration: Consider increase rosuvastatin  to 40 mg daily if LDL remains above goal despite recent weight loss/dietary modifications   5. Healthcare Maintenance:  Pneumococcal - Current status: Up to date (last received PPSV23 2017 after age 35)  Shingles - Current status: DUE (no record Shingrix in EMR) Influenza - Current status: DUE  Due to receive the following vaccines: Influenza, Shingrix, and Covid Booster   Follow Up Follow up with clinical pharmacist via phone in 4-6 weeks after PCP visit or sooner as needed if concerns on labs ?   Future Appointments  Date Time Provider Department Center  02/17/2024  8:50 AM Suzen Escort, PTA [facility] None  04/01/2024  9:00 AM Judithann Novas, MD [facility]   08/11/2024 11:15 AM Cameron Cea, MD [facility] None  12/23/2024  9:30 AM [facility] ANNUAL WELLNESS VISIT 1 [facility]   Daron Ellen, PharmD Clinical Pharmacist [facility] [phone]

## 2024-02-05 NOTE — Telephone Encounter (Signed)
 Last office visit 12/25/2023 for CPE.  Last refilled 11/12/2023 for #90 with no refills.  Next Appt: 04/01/24 for DM.

## 2024-02-17 ENCOUNTER — Ambulatory Visit: Payer: PPO | Attending: General Surgery

## 2024-02-17 VITALS — Wt 128.2 lb

## 2024-02-17 DIAGNOSIS — Z483 Aftercare following surgery for neoplasm: Secondary | ICD-10-CM | POA: Insufficient documentation

## 2024-02-17 NOTE — Therapy (Signed)
 OUTPATIENT PHYSICAL THERAPY SOZO SCREENING NOTE   Patient Name: April Ross MRN: [id number] DOB:1951-04-24, 73 y.o., female Today's Date: 02/17/2024  PCP: Judithann Novas, MD REFERRING PROVIDER: Caralyn Chandler, MD   PT End of Session - 02/17/24 0836     Visit Number 19   # unchnaged due to screen only   PT Start Time 0838    PT Stop Time 0846    PT Time Calculation (min) 8 min    Activity Tolerance Patient tolerated treatment well    Behavior During Therapy [facility] for tasks assessed/performed             Past Medical History:  Diagnosis Date   Allergy    Arthritis    Breast cancer (HCC)    right side 07/2021   Cancer (HCC)    CRD (chronic renal disease)    stage 3   Depression    Diabetes mellitus without complication (HCC)    GERD (gastroesophageal reflux disease)    Hyperlipidemia    Hypertension    LBBB (left bundle branch block)    a. diagnosed in 10/2017 - echo showed a preserved EF of 55-60% and NST was low-risk showing no evidence of ischemia.    Osteoporosis    polyps in colon    Thyroid  disease    Urinary tract infection    Past Surgical History:  Procedure Laterality Date   ABDOMINAL HYSTERECTOMY     APPENDECTOMY     BREAST BIOPSY Right    BREAST LUMPECTOMY Right 07/2021   BREAST LUMPECTOMY WITH RADIOACTIVE SEED AND SENTINEL LYMPH NODE BIOPSY Right 07/24/2021   Procedure: RIGHT BREAST LUMPECTOMY WITH RADIOACTIVE SEED AND SENTINEL LYMPH NODE BIOPSY;  Surgeon: Caralyn Chandler, MD;  Location: [facility];  Service: General;  Laterality: Right;   CARPAL TUNNEL RELEASE Right 11/14/2022   Procedure: CARPAL TUNNEL RELEASE;  Surgeon: Marilyn Shropshire, MD;  Location: [facility];  Service: Orthopedics;  Laterality: Right;  or MAC with local   Patient Active Problem List   Diagnosis Date Noted   Sjogren's syndrome with keratoconjunctivitis sicca (HCC) 12/25/2023   Palpitations 12/25/2023   LBBB (left bundle branch block)  12/25/2023   Anterior chest wall pain 07/23/2023   Microalbuminuria 04/02/2023   Skin lesion of back 04/02/2023   Chronic tension-type headache, not intractable 07/25/2021   Carcinoma of upper-inner quadrant of right breast in female, estrogen receptor positive (HCC) 06/15/2021   Diabetic retinopathy (HCC) 11/24/2020   Acute midline thoracic back pain 11/21/2018   Type 2 diabetes mellitus with nephropathy (HCC) 10/27/2017   Osteoporosis 05/24/2016   CKD (chronic kidney disease) stage 3, GFR 30-59 ml/min (HCC) 05/08/2016   Hyperlipidemia associated with type 2 diabetes mellitus (HCC) 01/12/2016   Hypothyroidism 01/12/2016   Hypertension associated with diabetes (HCC) 01/12/2016   Allergic rhinitis 01/12/2016   Moderate major depression, single episode (HCC) 01/12/2016   Chronic insomnia 01/12/2016   GERD (gastroesophageal reflux disease) 01/12/2016    REFERRING DIAG: right breast cancer at risk for lymphedema  THERAPY DIAG: Aftercare following surgery for neoplasm  PERTINENT HISTORY: Pt will be had a Rt lumpectomy with 2/11 lymph nodes positive on 07/24/21.Had infection and drainage just proximal to incision site during radiatio Done with radiation 2/7/20923 . No chemotherapy and now on antiestrogen.   Pt has an increase in SOZO and so is now wearing a sleeve for 4 weeks  other history includes: DM, stage 3 CKD, osteoporosis  PRECAUTIONS: right UE Lymphedema  risk, None  SUBJECTIVE: Pt returns for her L-Dex screen.   PAIN:  Are you having pain? No  SOZO SCREENING:Patient was assessed today using the SOZO machine to determine the lymphedema index score. This was compared to her baseline score. It was determined that she is NOT within the recommended range when compared to her baseline. She will again resume wearing her compression sleeve during day some and sleep in her nighttime garment. She wants to cont 3 month monitoring as it seems she may always have subclinical lymphedema due to  the higher amount of lymph nodes removed and she is learning to manage her symptoms.   Will set pt up an account with [employer] to order new compression sleeves.    L-DEX FLOWSHEETS - 02/17/24 0800       L-DEX LYMPHEDEMA SCREENING   Measurement Type Unilateral    L-DEX MEASUREMENT EXTREMITY Upper Extremity    POSITION  Standing    DOMINANT SIDE Right    At Risk Side Right    BASELINE SCORE (UNILATERAL) -5.9    L-DEX SCORE (UNILATERAL) -6.5    VALUE CHANGE (UNILAT) -0.6            P: Pt to have another 3 month SOZO despite being at her 2 years from surgery with her history of lymphedema.    Denyce Flank, PTA 02/17/2024, 8:46 AM

## 2024-03-09 ENCOUNTER — Other Ambulatory Visit (INDEPENDENT_AMBULATORY_CARE_PROVIDER_SITE_OTHER): Payer: Self-pay | Admitting: Pharmacist

## 2024-03-09 ENCOUNTER — Encounter: Payer: Self-pay | Admitting: Podiatry

## 2024-03-09 ENCOUNTER — Encounter: Payer: Self-pay | Admitting: Pharmacist

## 2024-03-09 ENCOUNTER — Ambulatory Visit (INDEPENDENT_AMBULATORY_CARE_PROVIDER_SITE_OTHER): Admitting: Podiatry

## 2024-03-09 DIAGNOSIS — L600 Ingrowing nail: Secondary | ICD-10-CM

## 2024-03-09 DIAGNOSIS — E1121 Type 2 diabetes mellitus with diabetic nephropathy: Secondary | ICD-10-CM

## 2024-03-09 DIAGNOSIS — L84 Corns and callosities: Secondary | ICD-10-CM | POA: Diagnosis not present

## 2024-03-09 DIAGNOSIS — N1831 Chronic kidney disease, stage 3a: Secondary | ICD-10-CM

## 2024-03-09 MED ORDER — BACITRA-NEOMYCIN-POLYMYXIN-HC 1 % OP OINT: TOPICAL_OINTMENT | OPHTHALMIC | 0 refills | Status: DC

## 2024-03-09 MED ORDER — CEPHALEXIN 500 MG PO CAPS: 500.0000 mg | ORAL_CAPSULE | Freq: Three times a day (TID) | ORAL | 0 refills | Status: DC

## 2024-03-09 NOTE — Patient Instructions (Signed)

## 2024-03-09 NOTE — Progress Notes (Unsigned)
 Subjective:   Patient ID: April Ross, female   DOB: 73 y.o.   MRN: [id number]   HPI Chief Complaint  Patient presents with   Ingrown Toenail    RM#13 Right foot big toe second toe sore spot.   73 year old female presents the office today with concerns of ingrown toenail to the right big toe, lateral aspect.  She does not see any drainage or pus but the area is tender with pressure.  She also has a spot on the right second toe, medial aspect which is causing pain.  She does not recall any injuries.  She does not report any drainage or pus.  No recent treatment.   Review of Systems  All other systems reviewed and are negative.       Objective:  Physical Exam  General: AAO x3, NAD  Dermatological: Incurvation present to right lateral hallux nail border with localized edema and erythema without any ascending cellulitis.  No drainage or pus.  There is a thick type lesion noted along the medial aspect of the right second toe on the IPJ without any underlying ulceration, drainage or any signs of infection.  Vascular: Dorsalis Pedis artery and Posterior Tibial artery pedal pulses are 2/4 bilateral with immedate capillary fill time. There is no pain with calf compression, swelling, warmth, erythema.   Neruologic: Grossly intact via light touch bilateral.   Musculoskeletal: Tenderness along the skin lesion, ingrown toenail but no other areas of discomfort identified at this time.  The hallux, second toes are rubbing.  Gait: Unassisted, Nonantalgic.       Assessment:   Toenail right hallux, skin lesion right foot     Plan:  -Treatment options discussed including all alternatives, risks, and complications -Etiology of symptoms were discussed -At this time, the patient is requesting partial nail removal with chemical matricectomy to the symptomatic portion of the nail. Risks and complications were discussed with the patient for which they understand and written consent was obtained.  Under sterile conditions a total of 3 mL of a mixture of 2% lidocaine  plain and 0.5% Marcaine  plain was infiltrated in a hallux block fashion. Once anesthetized, the skin was prepped in sterile fashion. A tourniquet was then applied. Next the lateral aspect of hallux nail border was then sharply excised making sure to remove the entire offending nail border. Once the nails were ensured to be removed area was debrided and the underlying skin was intact. There is no purulence identified in the procedure. Next phenol was then applied under standard conditions and copiously irrigated.  Silvadene was applied. A dry sterile dressing was applied. After application of the dressing the tourniquet was removed and there is found to be an immediate capillary refill time to the digit. The patient tolerated the procedure well any complications. Post procedure instructions were discussed the patient for which he verbally understood. Discussed signs/symptoms of infection and directed to call the office immediately should any occur or go directly to the emergency room. In the meantime, encouraged to call the office with any questions, concerns, changes symptoms. - Keflex  - Sharply debrided hyperkeratotic lesion without any complications or bleeding -Toe spacers dispensed  Charity Conch DPM

## 2024-03-09 NOTE — Progress Notes (Addendum)
 Brief Telephone Documentation Reason for Call: Medication monitoring  Summary of Call: Due for 4-week BMP in the setting of SGLT2i start in May.  Patient would like to know if she will be required to also get labs prior to upcoming PCP visit 04/01/24. Routed question to PCP [facility] for clarification on f/u labs.   Follow Up: Pending scheduling lab visit until response from PCP clinical pool on plan for routine f/u labs per patient preference.  Spoke w patient again and reviewed option to obtain BMP now, or to defer. Recommended routine safety monitoring as to ensure no AKI s/p SGLT2i start.  Patient preference for labs = [facility] (lives in [place]).  BMP Pended to PCP @[facility]    Daron Ellen, PharmD Clinical Pharmacist [facility] [phone]

## 2024-03-12 ENCOUNTER — Other Ambulatory Visit: Payer: Self-pay | Admitting: Family Medicine

## 2024-03-12 DIAGNOSIS — N1831 Chronic kidney disease, stage 3a: Secondary | ICD-10-CM | POA: Diagnosis not present

## 2024-03-12 DIAGNOSIS — E1121 Type 2 diabetes mellitus with diabetic nephropathy: Secondary | ICD-10-CM | POA: Diagnosis not present

## 2024-03-13 ENCOUNTER — Other Ambulatory Visit: Payer: Self-pay | Admitting: Podiatry

## 2024-03-13 ENCOUNTER — Telehealth: Payer: Self-pay | Admitting: Podiatry

## 2024-03-13 MED ORDER — DOXYCYCLINE HYCLATE 100 MG PO TABS: 100.0000 mg | ORAL_TABLET | Freq: Two times a day (BID) | ORAL | 0 refills | Status: DC

## 2024-03-13 MED ORDER — MUPIROCIN 2 % EX OINT: 1.0000 | TOPICAL_OINTMENT | Freq: Two times a day (BID) | CUTANEOUS | 2 refills | Status: DC

## 2024-03-13 NOTE — Telephone Encounter (Signed)
 Patient called stating that she was seen this past Monday, and is concerned about the healing process for her ingrown toenail and would like to speak with a nurse. Her toe is red and tender at the base of the toenail.

## 2024-03-17 ENCOUNTER — Telehealth: Payer: Self-pay | Admitting: Family Medicine

## 2024-03-17 LAB — BASIC METABOLIC PANEL WITH GFR
BUN/Creatinine Ratio: 16 (ref 12–28)
BUN: 20 mg/dL (ref 8–27)
CO2: 19 mmol/L — ABNORMAL LOW (ref 20–29)
Calcium: 9.7 mg/dL (ref 8.7–10.3)
Chloride: 104 mmol/L (ref 96–106)
Creatinine, Ser: 1.28 mg/dL — ABNORMAL HIGH (ref 0.57–1.00)
Glucose: 115 mg/dL — ABNORMAL HIGH (ref 70–99)
Potassium: 4.4 mmol/L (ref 3.5–5.2)
Sodium: 141 mmol/L (ref 134–144)
eGFR: 44 mL/min/{1.73_m2} — ABNORMAL LOW (ref >59)

## 2024-03-17 NOTE — Telephone Encounter (Signed)
 Received from Novo Nordisk PAP:  Ozempic  8mg /82ml Lot # [id number] Exp: 08/07/2026 x 4 boxes.  Ms. Cobos notified by telephone that medication is here at the office ready to be picked up.

## 2024-03-18 ENCOUNTER — Ambulatory Visit: Payer: Self-pay | Admitting: Family Medicine

## 2024-03-23 ENCOUNTER — Ambulatory Visit: Admitting: Podiatry

## 2024-03-24 ENCOUNTER — Ambulatory Visit (INDEPENDENT_AMBULATORY_CARE_PROVIDER_SITE_OTHER): Admitting: Podiatry

## 2024-03-24 DIAGNOSIS — L600 Ingrowing nail: Secondary | ICD-10-CM

## 2024-03-24 NOTE — Patient Instructions (Addendum)
Continue soaking in epsom salts or warm soapy water twice a day followed by antibiotic ointment and a band-aid. Can leave uncovered at night. Continue this until completely healed.  If the area has not healed in 2 weeks, call the office for follow-up appointment, or sooner if any problems arise.  Monitor for any signs/symptoms of infection. Call the office immediately if any occur or go directly to the emergency room. Call with any questions/concerns.

## 2024-03-25 ENCOUNTER — Encounter: Payer: Self-pay | Admitting: Pharmacist

## 2024-03-29 NOTE — Progress Notes (Signed)
 Subjective:   Patient ID: April Ross, female   DOB: 73 y.o.   MRN: [id number]   HPI Chief Complaint  Patient presents with   Nail Problem    RM#14 Right great toe pain patient states still struggling with infection has been soaking and applying ointment and no change. Has completed oral antibiotics and believe infection is present.    73 year old female presents the office today for follow-up evaluation after Partial nail avulsion of the right hallux.  She did finish the antibiotics.  She is having some soreness at the base of the toenail.  No purulence.  No injuries.    She states the toe spacer dispensed did help the corn quite a bit on the second toe.      Review of Systems  All other systems reviewed and are negative.       Objective:  Physical Exam  General: AAO x3, NAD  Dermatological: Status post partial nail avulsion of the right hallux.  There is still some scabbing present on the proximal nail fold with some localized edema and erythema which I think is more from inflammation as opposed to infection.  There is no ascending cellulitis.  There is no fluctuation or crepitation.  There is no malodor.   Vascular: Dorsalis Pedis artery and Posterior Tibial artery pedal pulses are 2/4 bilateral with immedate capillary fill time. There is no pain with calf compression, swelling, warmth, erythema.   Neruologic: Grossly intact via light touch bilateral.   Musculoskeletal: Tenderness on the proximal nail fold laterally.     Assessment:   Toenail right hallux, skin lesion right foot     Plan:  -Treatment options discussed including all alternatives, risks, and complications -Etiology of symptoms were discussed -I did use a curette to help clean the area where the procedure site was.  Not able to appreciate any purulence.  I do encourage her to continue soaking in a small amount of antibiotic ointment and a bandage in the day beginning the area open at nighttime.  I  also think that the toe spacers that although is helping the second toe is putting too much pressure on the big toes I dispensed a smaller toe spacer.  I think the other one was too bulky putting increased pressure on the nail border.  She has been wearing shoes and socks and she likes to wear open toed shoes and discussed that she can do that, and it may actually help the healing at this time.  -Monitor for any clinical signs or symptoms of infection and directed to call the office immediately should any occur or go to the ER.  Return in about 4 weeks (around 04/21/2024), or if symptoms worsen or fail to improve, for nail check.  Donnice JONELLE Fees DPM

## 2024-04-01 ENCOUNTER — Ambulatory Visit: Admitting: Family Medicine

## 2024-04-01 VITALS — BP 134/74 | HR 79 | Temp 99.1°F | Ht 61.0 in | Wt 124.6 lb

## 2024-04-01 DIAGNOSIS — E1122 Type 2 diabetes mellitus with diabetic chronic kidney disease: Secondary | ICD-10-CM | POA: Diagnosis not present

## 2024-04-01 DIAGNOSIS — E1121 Type 2 diabetes mellitus with diabetic nephropathy: Secondary | ICD-10-CM

## 2024-04-01 DIAGNOSIS — N1831 Chronic kidney disease, stage 3a: Secondary | ICD-10-CM | POA: Diagnosis not present

## 2024-04-01 DIAGNOSIS — L600 Ingrowing nail: Secondary | ICD-10-CM | POA: Diagnosis not present

## 2024-04-01 DIAGNOSIS — E1159 Type 2 diabetes mellitus with other circulatory complications: Secondary | ICD-10-CM | POA: Diagnosis not present

## 2024-04-01 DIAGNOSIS — I152 Hypertension secondary to endocrine disorders: Secondary | ICD-10-CM | POA: Diagnosis not present

## 2024-04-01 DIAGNOSIS — Z7984 Long term (current) use of oral hypoglycemic drugs: Secondary | ICD-10-CM

## 2024-04-01 LAB — POCT GLYCOSYLATED HEMOGLOBIN (HGB A1C): Hemoglobin A1C: 6.1 % — AB (ref 4.0–5.6)

## 2024-04-01 NOTE — Assessment & Plan Note (Signed)
Stable, chronic.  Continue current medication.  Continue amlodipine 5 mg p.o. daily and losartan 100 mg p.o. daily 

## 2024-04-01 NOTE — Assessment & Plan Note (Signed)
 Status post partial nail removal by podiatry 3 weeks ago.  Followed up with him for similar appearance June 17.  Status post treatment course with 2 antibiotics.  She reports she noted no difference with antibiotic.  She continues warm Epsom soaks twice daily and is applying mupirocin  ointment. There is no clear area that needs incision and drainage on exam today.  Recommended following up with podiatry if redness spreading or pain increasing.  She already has scheduled appointment in approximately 2 weeks.

## 2024-04-01 NOTE — Assessment & Plan Note (Signed)
 Chronic, significant improvement with addition of renal dosed Jardiance  at 10 mg daily.  She continues on semaglutide  2 mg weekly. In the last few days since she changed her sensor she has noted very frequent lows without any symptoms of lows.  She feels that she has a faulty sensor.  She will continue to follow her blood sugar and verify lows with fingerstick.  Of note when she did check a less than 60 on her continuous glucose monitor with a fingerstick it was at 107.

## 2024-04-01 NOTE — Assessment & Plan Note (Signed)
 Recent check stable.

## 2024-04-01 NOTE — Telephone Encounter (Signed)
 Patient picked up medication

## 2024-04-01 NOTE — Progress Notes (Signed)
 Patient ID: April Ross, female    DOB: 19-Nov-1950, 73 y.o.   MRN: [id number]  This visit was conducted in person.  BP 134/74   Pulse 79   Temp 99.1 F (37.3 C) (Oral)   Ht 5' 1 (1.549 m)   Wt 124 lb 9.6 oz (56.5 kg)   SpO2 99%   BMI 23.54 kg/m    CC:  Chief Complaint  Patient presents with   Follow-up    3 month diabetes   ingrown toe nail     She had it removed at [facility] but she believes that it may have become infected    Subjective:   HPI: April Ross is a 73 y.o. female presenting on 04/01/2024 for Follow-up (3 month diabetes) and ingrown toe nail  (She had it removed at [facility] but she believes that it may have become infected)    Saw Dr. Alona podiatry on March 09, 2024 for ingrown toenail, removed splinter, not nail edge..  This was removed.  She is concerned now there may be an infection given increased pain and redness. Last week returned and was given antibiotics 1 week ago.. doxycycline ..   Soaking 2 times a day with epsom salts, using mupirocin .  Ha follow up appt with podiatry 04/21/2024  Diabetes: Good control on semaglutide  2 mg weekly and new Jardiance  10 mg daily.. no SE. She is avoiding carbs, no pasta, rare rice. Lab Results  Component Value Date   HGBA1C 6.1 (A) 04/01/2024  Using medications without difficulties: Hypoglycemic episodes: Hyperglycemic episodes: Feet problems:  see above Blood Sugars averaging: eye exam within last year: Associated with chronic kidney disease: On ARB and now on SGLT2i Wt Readings from Last 3 Encounters:  04/01/24 124 lb 9.6 oz (56.5 kg)  02/17/24 128 lb 4 oz (58.2 kg)  12/25/23 132 lb 9.6 oz (60.1 kg)   Hypertension:  Well-controlled on losartan  100 mg daily and amlodipine  5 mg daily BP Readings from Last 3 Encounters:  04/01/24 134/74  12/25/23 126/64  09/19/23 130/69  Using medication without problems or lightheadedness:  none Chest pain with exertion:one Edema: none Short of breath: none Average home  BPs: Other issues:   Relevant past medical, surgical, family and social history reviewed and updated as indicated. Interim medical history since our last visit reviewed. Allergies and medications reviewed and updated. Outpatient Medications Prior to Visit  Medication Sig Dispense Refill   Alcohol Swabs ([employer] PREP) 70 % PADS USE TO TEST BLOOD SUGAR UP TO 2 TIMES A DAY 100 each 5   alendronate  (FOSAMAX ) 70 MG tablet Take 1 tablet (70 mg total) by mouth every 7 (seven) days. Take with a full glass of water on an empty stomach. 4 tablet 11   amLODipine  (NORVASC ) 5 MG tablet TAKE 1 TABLET (5 MG TOTAL) BY MOUTH DAILY. 90 tablet 3   APPLE CIDER VINEGAR PO Take by mouth.     bacitracin-neomycin -polymyxin-hydrocortisone (CORTISPORIN) 1 % ophthalmic ointment Apply one drop the toenail twice a day 3.5 g 0   Blood Glucose Monitoring Suppl (ONETOUCH VERIO REFLECT) w/Device KIT Use to check blood sugar up to 2 times a day 1 kit 0   cetirizine (ZYRTEC) 10 MG tablet Take 10 mg by mouth at bedtime as needed for allergies.     CINNAMON PO Take 1 tablet by mouth daily.     Continuous Glucose Sensor (FREESTYLE LIBRE 3 PLUS SENSOR) MISC Use to check glucose continuously. Change sensor every 15 days. 2  each 11   empagliflozin  (JARDIANCE ) 10 MG TABS tablet Take 1 tablet (10 mg total) by mouth daily before breakfast. 30 tablet 2   glucose blood (ONETOUCH VERIO) test strip Use to check blood sugar up to 2 times a day 100 each 5   Lancets (ONETOUCH DELICA PLUS LANCET30G) MISC Use to check blood sugar up to 2 times a day 100 each 5   letrozole  (FEMARA ) 2.5 MG tablet TAKE 1 TABLET BY MOUTH EVERY DAY 90 tablet 3   levothyroxine  (SYNTHROID ) 88 MCG tablet TAKE 1 TABLET BY MOUTH EVERY DAY 90 tablet 3   losartan  (COZAAR ) 100 MG tablet TAKE 1 TABLET BY MOUTH EVERY DAY 90 tablet 3   NON FORMULARY Ginsing root     Omega-3 Fatty Acids (FISH OIL OMEGA-3 PO) Take 1 capsule by mouth daily.     OVER THE COUNTER MEDICATION Liver  gallbladder support tablets     OVER THE COUNTER MEDICATION Leg and back pain relief tablets     PREBIOTIC PRODUCT PO Take by mouth.     rosuvastatin  (CRESTOR ) 20 MG tablet TAKE 1 TABLET BY MOUTH EVERY DAY 90 tablet 3   Semaglutide , 2 MG/DOSE, (OZEMPIC , 2 MG/DOSE,) 8 MG/3ML SOPN Inject 2 mg into the skin once a week. Via Novo Nordisk PAP     tiZANidine  (ZANAFLEX ) 4 MG tablet TAKE 1 TABLET BY MOUTH AT BEDTIME AS NEEDED FOR MUSCLE SPASMS 90 tablet 0   traMADol  (ULTRAM ) 50 MG tablet TAKE 1 TABLET BY MOUTH EVERY DAY AS NEEDED 30 tablet 0   Turmeric (QC TUMERIC COMPLEX PO) Take 1 tablet by mouth daily.     vitamin B-12 (CYANOCOBALAMIN ) 1000 MCG tablet Take 1,000 mcg by mouth daily.     VITAMIN D  PO Take 2 each by mouth daily.     cephALEXin  (KEFLEX ) 500 MG capsule Take 1 capsule (500 mg total) by mouth 3 (three) times daily. (Patient not taking: Reported on 04/01/2024) 21 capsule 0   gabapentin  (NEURONTIN ) 100 MG capsule Take 100 mg by mouth 3 (three) times daily as needed. (Patient not taking: Reported on 04/01/2024)     mupirocin  ointment (BACTROBAN ) 2 % Apply 1 Application topically 2 (two) times daily. (Patient not taking: Reported on 04/01/2024) 30 g 2   No facility-administered medications prior to visit.     Per HPI unless specifically indicated in ROS section below Review of Systems  Constitutional:  Negative for fatigue and fever.  HENT:  Negative for congestion.   Eyes:  Negative for pain.  Respiratory:  Negative for cough and shortness of breath.   Cardiovascular:  Negative for chest pain, palpitations and leg swelling.  Gastrointestinal:  Negative for abdominal pain.  Genitourinary:  Negative for dysuria and vaginal bleeding.  Musculoskeletal:  Negative for back pain.  Neurological:  Negative for syncope, light-headedness and headaches.  Psychiatric/Behavioral:  Negative for dysphoric mood.    Objective:  BP 134/74   Pulse 79   Temp 99.1 F (37.3 C) (Oral)   Ht 5' 1 (1.549 m)    Wt 124 lb 9.6 oz (56.5 kg)   SpO2 99%   BMI 23.54 kg/m   Wt Readings from Last 3 Encounters:  04/01/24 124 lb 9.6 oz (56.5 kg)  02/17/24 128 lb 4 oz (58.2 kg)  12/25/23 132 lb 9.6 oz (60.1 kg)      Physical Exam Constitutional:      General: She is not in acute distress.    Appearance: Normal appearance. She is well-developed. She  is not ill-appearing or toxic-appearing.  HENT:     Head: Normocephalic.     Right Ear: Hearing, tympanic membrane, ear canal and external ear normal. Tympanic membrane is not erythematous, retracted or bulging.     Left Ear: Hearing, tympanic membrane, ear canal and external ear normal. Tympanic membrane is not erythematous, retracted or bulging.     Nose: No mucosal edema or rhinorrhea.     Right Sinus: No maxillary sinus tenderness or frontal sinus tenderness.     Left Sinus: No maxillary sinus tenderness or frontal sinus tenderness.     Mouth/Throat:     Pharynx: Uvula midline.   Eyes:     General: Lids are normal. Lids are everted, no foreign bodies appreciated.     Conjunctiva/sclera: Conjunctivae normal.     Pupils: Pupils are equal, round, and reactive to light.   Neck:     Thyroid : No thyroid  mass or thyromegaly.     Vascular: No carotid bruit.     Trachea: Trachea normal.   Cardiovascular:     Rate and Rhythm: Normal rate and regular rhythm.     Pulses: Normal pulses.     Heart sounds: Normal heart sounds, S1 normal and S2 normal. No murmur heard.    No friction rub. No gallop.  Pulmonary:     Effort: Pulmonary effort is normal. No tachypnea or respiratory distress.     Breath sounds: Normal breath sounds. No decreased breath sounds, wheezing, rhonchi or rales.  Chest:     Chest wall: No mass or tenderness.  Breasts:    Left: No swelling, bleeding, mass or nipple discharge.  Abdominal:     General: Bowel sounds are normal.     Palpations: Abdomen is soft.     Tenderness: There is no abdominal tenderness.   Musculoskeletal:      Cervical back: Normal, normal range of motion and neck supple. No bony tenderness. Normal range of motion.     Thoracic back: Bony tenderness present. No spasms or tenderness. Normal range of motion.   Skin:    General: Skin is warm and dry.     Findings: No Justiniano.   Neurological:     Mental Status: She is alert.   Psychiatric:        Mood and Affect: Mood is not anxious or depressed.        Speech: Speech normal.        Behavior: Behavior normal. Behavior is cooperative.        Thought Content: Thought content normal.        Judgment: Judgment normal.       Diabetic foot exam:  See photo No area of fluctuance   Results for orders placed or performed in visit on 04/01/24  POCT glycosylated hemoglobin (Hb A1C)   Collection Time: 04/01/24  9:27 AM  Result Value Ref Range   Hemoglobin A1C 6.1 (A) 4.0 - 5.6 %   HbA1c POC (<> result, manual entry)     HbA1c, POC (prediabetic range)     HbA1c, POC (controlled diabetic range)      Assessment and Plan  Type 2 diabetes mellitus with diabetic chronic kidney disease, unspecified CKD stage, unspecified whether long term insulin  use (HCC) -     POCT glycosylated hemoglobin (Hb A1C)  Hypertension associated with diabetes (HCC) Assessment & Plan: Stable, chronic.  Continue current medication.  Continue amlodipine  5 mg p.o. daily and losartan  100 mg p.o. daily   Stage 3a chronic kidney  disease [facility]) Assessment & Plan:  Recent check stable.   Type 2 diabetes mellitus with nephropathy (HCC) Assessment & Plan: Chronic, significant improvement with addition of renal dosed Jardiance  at 10 mg daily.  She continues on semaglutide  2 mg weekly. In the last few days since she changed her sensor she has noted very frequent lows without any symptoms of lows.  She feels that she has a faulty sensor.  She will continue to follow her blood sugar and verify lows with fingerstick.  Of note when she did check a less than 60 on her continuous  glucose monitor with a fingerstick it was at 107.     Ingrown right big toenail Assessment & Plan: Status post partial nail removal by podiatry 3 weeks ago.  Followed up with him for similar appearance June 17.  Status post treatment course with 2 antibiotics.  She reports she noted no difference with antibiotic.  She continues warm Epsom soaks twice daily and is applying mupirocin  ointment. There is no clear area that needs incision and drainage on exam today.  Recommended following up with podiatry if redness spreading or pain increasing.  She already has scheduled appointment in approximately 2 weeks.     Return in about 3 months (around 07/02/2024) for diabetes follow up  with fasting labs prior.   Greig Ring, MD

## 2024-04-08 ENCOUNTER — Other Ambulatory Visit: Payer: Self-pay | Admitting: Family Medicine

## 2024-04-09 ENCOUNTER — Other Ambulatory Visit: Payer: Self-pay | Admitting: Family Medicine

## 2024-04-09 ENCOUNTER — Ambulatory Visit (INDEPENDENT_AMBULATORY_CARE_PROVIDER_SITE_OTHER): Admitting: Podiatry

## 2024-04-09 DIAGNOSIS — L600 Ingrowing nail: Secondary | ICD-10-CM

## 2024-04-09 NOTE — Patient Instructions (Signed)

## 2024-04-09 NOTE — Telephone Encounter (Signed)
 Last office visit 04/01/2024 for DM/follow up ingrown toe nail.   Last refilled tizanidine  02/06/2024 for #90 with no refills.  Tramadol  09/30/23 fort #30 with no refills.  Next appt: 07/02/24 for DM.

## 2024-04-13 DIAGNOSIS — N1832 Chronic kidney disease, stage 3b: Secondary | ICD-10-CM | POA: Diagnosis not present

## 2024-04-13 DIAGNOSIS — E1122 Type 2 diabetes mellitus with diabetic chronic kidney disease: Secondary | ICD-10-CM | POA: Diagnosis not present

## 2024-04-13 DIAGNOSIS — I129 Hypertensive chronic kidney disease with stage 1 through stage 4 chronic kidney disease, or unspecified chronic kidney disease: Secondary | ICD-10-CM | POA: Diagnosis not present

## 2024-04-13 DIAGNOSIS — R809 Proteinuria, unspecified: Secondary | ICD-10-CM | POA: Diagnosis not present

## 2024-04-13 NOTE — Progress Notes (Signed)
 Subjective:   Patient ID: April Ross, female   DOB: 73 y.o.   MRN: [id number]   HPI Chief Complaint  Patient presents with   Nail Problem    RM#14 Right great toe pain patient states still struggling with infection has been soaking and applying ointment and no change. Has completed oral antibiotics and believe infection is present.    73 year old female presents the office today for follow-up evaluation after Partial nail avulsion of the right hallux.  She did finish the antibiotics and continues to soak her foot.  She states that she continues to have pain around the toenail on the side.   Review of Systems  All other systems reviewed and are negative.       Objective:  Physical Exam  General: AAO x3, NAD  Dermatological: Status post partial nail avulsion of the right hallux.  There is still tenderness to palpation on the proximal lateral nail border with localized edema without any erythema or warmth.  There is no ascending cellulitis.  There is no open lesions.  Vascular: Dorsalis Pedis artery and Posterior Tibial artery pedal pulses are 2/4 bilateral with immedate capillary fill time. There is no pain with calf compression, swelling, warmth, erythema.   Neruologic: Grossly intact via light touch bilateral.   Musculoskeletal: Tenderness on the proximal nail fold laterally.     Assessment:   Toenail right hallux, skin lesion right foot     Plan:  -Treatment options discussed including all alternatives, risks, and complications -Etiology of symptoms were discussed -She continues to have pain on the nail border.  I discussed with her repeat partial nail avulsion, exploration of the area.  Given her tenderness to do this is her best option she agrees to proceed with this.  She does not want any further phenol applied.  -At this time, recommended partial nail removal without chemical matricectomy to the lateral due to continued pain. Risks and complications were discussed  with the patient for which they understand and  verbally consent to the procedure. Under sterile conditions a total of 3 mL of a mixture of 2% lidocaine  plain and 0.5% Marcaine  plain was infiltrated in a hallux block fashion. Once anesthetized, the skin was prepped in sterile fashion. A tourniquet was then applied. Next a small piece along the lateral nail border was removed.  While there is no significant ingrowing there was skin buildup in this area which was debrided as well.  There is no purulence noted.  Border of the hallux nail border was sharply excised making sure to remove the entire offending nail border. Once the nail was removed, the area was debrided and the underlying skin was intact. The area was irrigated and hemostasis was obtained.  A dry sterile dressing was applied. After application of the dressing the tourniquet was removed and there is found to be an immediate capillary refill time to the digit. The patient tolerated the procedure well any complications. Post procedure instructions were discussed the patient for which he verbally understood. Follow-up in one week for nail check or sooner if any problems are to arise. Discussed signs/symptoms of worsening infection and directed to call the office immediately should any occur or go directly to the emergency room. In the meantime, encouraged to call the office with any questions, concerns, changes symptoms.  Return in about 2 weeks (around 04/23/2024), or if symptoms worsen or fail to improve, for nail check.  Donnice JONELLE Fees DPM

## 2024-04-15 LAB — LAB REPORT - SCANNED
Albumin, Urine POC: 318
Creatinine, POC: 75 mg/dL
EGFR: 42
Microalb Creat Ratio: 424

## 2024-04-21 ENCOUNTER — Ambulatory Visit: Admitting: Podiatry

## 2024-04-23 DIAGNOSIS — H40013 Open angle with borderline findings, low risk, bilateral: Secondary | ICD-10-CM | POA: Diagnosis not present

## 2024-04-23 DIAGNOSIS — H2513 Age-related nuclear cataract, bilateral: Secondary | ICD-10-CM | POA: Diagnosis not present

## 2024-04-23 DIAGNOSIS — M3501 Sicca syndrome with keratoconjunctivitis: Secondary | ICD-10-CM | POA: Diagnosis not present

## 2024-05-08 ENCOUNTER — Ambulatory Visit: Admitting: Podiatry

## 2024-05-18 ENCOUNTER — Ambulatory Visit: Attending: General Surgery

## 2024-05-18 VITALS — Wt 124.5 lb

## 2024-05-18 DIAGNOSIS — Z483 Aftercare following surgery for neoplasm: Secondary | ICD-10-CM | POA: Insufficient documentation

## 2024-05-18 NOTE — Therapy (Signed)
 OUTPATIENT PHYSICAL THERAPY SOZO SCREENING NOTE   Patient Name: April Ross MRN: [id number] DOB:1951-03-06, 73 y.o., female Today's Date: 05/18/2024  PCP: Avelina Greig BRAVO, MD REFERRING PROVIDER: Avelina Greig BRAVO, MD   PT End of Session - 05/18/24 0945     Visit Number 19   # unchanged due to screen only   PT Start Time 0943    PT Stop Time 0947    PT Time Calculation (min) 4 min    Activity Tolerance Patient tolerated treatment well    Behavior During Therapy [facility] for tasks assessed/performed          Past Medical History:  Diagnosis Date   Allergy    Arthritis    Breast cancer (HCC)    right side 07/2021   Cancer (HCC)    CRD (chronic renal disease)    stage 3   Depression    Diabetes mellitus without complication (HCC)    GERD (gastroesophageal reflux disease)    Hyperlipidemia    Hypertension    LBBB (left bundle branch block)    a. diagnosed in 10/2017 - echo showed a preserved EF of 55-60% and NST was low-risk showing no evidence of ischemia.    Osteoporosis    polyps in colon    Thyroid  disease    Urinary tract infection    Past Surgical History:  Procedure Laterality Date   ABDOMINAL HYSTERECTOMY     APPENDECTOMY     BREAST BIOPSY Right    BREAST LUMPECTOMY Right 07/2021   BREAST LUMPECTOMY WITH RADIOACTIVE SEED AND SENTINEL LYMPH NODE BIOPSY Right 07/24/2021   Procedure: RIGHT BREAST LUMPECTOMY WITH RADIOACTIVE SEED AND SENTINEL LYMPH NODE BIOPSY;  Surgeon: Curvin Deward MOULD, MD;  Location: [facility];  Service: General;  Laterality: Right;   CARPAL TUNNEL RELEASE Right 11/14/2022   Procedure: CARPAL TUNNEL RELEASE;  Surgeon: Romona Harari, MD;  Location: [facility];  Service: Orthopedics;  Laterality: Right;  or MAC with local   Patient Active Problem List   Diagnosis Date Noted   Ingrown right big toenail 04/01/2024   Sjogren's syndrome with keratoconjunctivitis sicca (HCC) 12/25/2023   Palpitations 12/25/2023   LBBB  (left bundle branch block) 12/25/2023   Anterior chest wall pain 07/23/2023   Microalbuminuria 04/02/2023   Skin lesion of back 04/02/2023   Chronic tension-type headache, not intractable 07/25/2021   Carcinoma of upper-inner quadrant of right breast in female, estrogen receptor positive (HCC) 06/15/2021   Diabetic retinopathy (HCC) 11/24/2020   Acute midline thoracic back pain 11/21/2018   Type 2 diabetes mellitus with nephropathy (HCC) 10/27/2017   Osteoporosis 05/24/2016   CKD (chronic kidney disease) stage 3, GFR 30-59 ml/min (HCC) 05/08/2016   Hyperlipidemia associated with type 2 diabetes mellitus (HCC) 01/12/2016   Hypothyroidism 01/12/2016   Hypertension associated with diabetes (HCC) 01/12/2016   Allergic rhinitis 01/12/2016   Moderate major depression, single episode (HCC) 01/12/2016   Chronic insomnia 01/12/2016   GERD (gastroesophageal reflux disease) 01/12/2016    REFERRING DIAG: right breast cancer at risk for lymphedema  THERAPY DIAG: Aftercare following surgery for neoplasm  PERTINENT HISTORY: Pt will be had a Rt lumpectomy with 2/11 lymph nodes positive on 07/24/21.Had infection and drainage just proximal to incision site during radiatio Done with radiation 2/7/20923 . No chemotherapy and now on antiestrogen.   Pt has an increase in SOZO and so is now wearing a sleeve for 4 weeks  other history includes: DM, stage 3 CKD, osteoporosis  PRECAUTIONS: right UE Lymphedema risk, None  SUBJECTIVE: Pt returns for her L-Dex screen.   PAIN:  Are you having pain? No  SOZO SCREENING:Patient was assessed today using the SOZO machine to determine the lymphedema index score. This was compared to her baseline score. It was determined that she is NOT within the recommended range when compared to her baseline. She will again resume wearing her compression sleeve during day some and sleep in her nighttime garment. She wants to cont 3 month monitoring as it seems she may always have  subclinical lymphedema due to the higher amount of lymph nodes removed and she is learning to manage her symptoms.     L-DEX FLOWSHEETS - 05/18/24 0900       L-DEX LYMPHEDEMA SCREENING   Measurement Type Unilateral    L-DEX MEASUREMENT EXTREMITY Upper Extremity    POSITION  Standing    DOMINANT SIDE Right    At Risk Side Right    BASELINE SCORE (UNILATERAL) -5.9    L-DEX SCORE (UNILATERAL) -0.8    VALUE CHANGE (UNILAT) 5.1         P: Transition to 6 months.    Aden Berwyn Caldron, PTA 05/18/2024, 9:48 AM

## 2024-06-25 ENCOUNTER — Other Ambulatory Visit

## 2024-06-25 ENCOUNTER — Ambulatory Visit: Payer: Self-pay | Admitting: Family Medicine

## 2024-06-25 DIAGNOSIS — E1122 Type 2 diabetes mellitus with diabetic chronic kidney disease: Secondary | ICD-10-CM

## 2024-06-25 DIAGNOSIS — N1831 Chronic kidney disease, stage 3a: Secondary | ICD-10-CM | POA: Diagnosis not present

## 2024-06-25 DIAGNOSIS — E1121 Type 2 diabetes mellitus with diabetic nephropathy: Secondary | ICD-10-CM

## 2024-06-25 LAB — BASIC METABOLIC PANEL WITH GFR
BUN: 29 mg/dL — ABNORMAL HIGH (ref 6–23)
CO2: 25 meq/L (ref 19–32)
Calcium: 10.3 mg/dL (ref 8.4–10.5)
Chloride: 105 meq/L (ref 96–112)
Creatinine, Ser: 1.27 mg/dL — ABNORMAL HIGH (ref 0.40–1.20)
GFR: 41.42 mL/min — ABNORMAL LOW (ref >60.00)
Glucose, Bld: 146 mg/dL — ABNORMAL HIGH (ref 70–99)
Potassium: 4 meq/L (ref 3.5–5.1)
Sodium: 138 meq/L (ref 135–145)

## 2024-06-25 NOTE — Addendum Note (Signed)
 Addended by: HOPE VEVA PARAS on: 06/25/2024 08:18 AM   Modules accepted: Orders

## 2024-07-02 ENCOUNTER — Encounter: Payer: Self-pay | Admitting: Family Medicine

## 2024-07-02 ENCOUNTER — Ambulatory Visit: Admitting: Family Medicine

## 2024-07-02 VITALS — BP 106/60 | HR 68 | Temp 98.4°F | Ht 61.0 in | Wt 124.5 lb

## 2024-07-02 DIAGNOSIS — E1122 Type 2 diabetes mellitus with diabetic chronic kidney disease: Secondary | ICD-10-CM | POA: Diagnosis not present

## 2024-07-02 DIAGNOSIS — I152 Hypertension secondary to endocrine disorders: Secondary | ICD-10-CM

## 2024-07-02 DIAGNOSIS — E1159 Type 2 diabetes mellitus with other circulatory complications: Secondary | ICD-10-CM | POA: Diagnosis not present

## 2024-07-02 DIAGNOSIS — N189 Chronic kidney disease, unspecified: Secondary | ICD-10-CM

## 2024-07-02 DIAGNOSIS — E1121 Type 2 diabetes mellitus with diabetic nephropathy: Secondary | ICD-10-CM

## 2024-07-02 DIAGNOSIS — E119 Type 2 diabetes mellitus without complications: Secondary | ICD-10-CM

## 2024-07-02 DIAGNOSIS — Z7984 Long term (current) use of oral hypoglycemic drugs: Secondary | ICD-10-CM

## 2024-07-02 LAB — POCT GLYCOSYLATED HEMOGLOBIN (HGB A1C): Hemoglobin A1C: 6.5 % — AB (ref 4.0–5.6)

## 2024-07-02 NOTE — Assessment & Plan Note (Signed)
Stable, chronic.  Continue current medication.  Continue amlodipine 5 mg p.o. daily and losartan 100 mg p.o. daily 

## 2024-07-02 NOTE — Progress Notes (Signed)
 Patient ID: April Ross, female    DOB: [birth year], 73 y.o.   MRN: [id number]  This visit was conducted in person.  BP 106/60   Pulse 68   Temp 98.4 F (36.9 C) (Temporal)   Ht 5' 1 (1.549 m)   Wt 124 lb 8 oz (56.5 kg)   SpO2 98%   BMI 23.52 kg/m    CC:  Chief Complaint  Patient presents with  . Diabetes    Subjective:   HPI: April Ross is a 73 y.o. female presenting on 07/02/2024 for Diabetes       She  has been feeling very tired.  She is not sleeping at night.  No pain or urinary frequency waking her up.  She feel cannot shut mind off.  Possibly secondary to   Not interested in med to sleep... SE to Ambien  and trazodone  in past.   Diabetes: Good control on semaglutide  2 mg weekly and new Jardiance  10 mg daily.. no SE. She is avoiding carbs, no pasta, rare rice. Lab Results  Component Value Date   HGBA1C 6.1 (A) 04/01/2024  Using medications without difficulties: Hypoglycemic episodes: none Hyperglycemic episodes: none Feet problems:  see above Blood Sugars averaging: eye exam within last year: yes Associated with chronic kidney disease: On ARB and now on SGLT2i Wt Readings from Last 3 Encounters:  07/02/24 124 lb 8 oz (56.5 kg)  05/18/24 124 lb 8 oz (56.5 kg)  04/01/24 124 lb 9.6 oz (56.5 kg)  Body mass index is 23.52 kg/m.  Hypertension:  Well-controlled on losartan  100 mg daily and amlodipine  5 mg daily  5-6 glasses of water a day. BP Readings from Last 3 Encounters:  07/02/24 106/60  04/01/24 134/74  12/25/23 126/64  Using medication without problems or lightheadedness:  none Chest pain with exertion:one Edema: none Short of breath: none Average home BPs: Other issues:  Relevant past medical, surgical, family and social history reviewed and updated as indicated. Interim medical history since our last visit reviewed. Allergies and medications reviewed and updated. Outpatient Medications Prior to Visit  Medication Sig Dispense Refill   . Alcohol Swabs ([facility] PREP) 70 % PADS USE TO TEST BLOOD SUGAR UP TO 2 TIMES A DAY 100 each 5  . alendronate  (FOSAMAX ) 70 MG tablet TAKE 1 TABLET (70 MG TOTAL) BY MOUTH EVERY 7 DAYS WITH FULL GLASS WATER ON EMPTY STOMACH 12 tablet 3  . amLODipine  (NORVASC ) 5 MG tablet TAKE 1 TABLET (5 MG TOTAL) BY MOUTH DAILY. 90 tablet 3  . APPLE CIDER VINEGAR PO Take by mouth.    . Blood Glucose Monitoring Suppl (ONETOUCH VERIO REFLECT) w/Device KIT Use to check blood sugar up to 2 times a day 1 kit 0  . cetirizine (ZYRTEC) 10 MG tablet Take 10 mg by mouth at bedtime as needed for allergies.    SABRA CINNAMON PO Take 1 tablet by mouth daily.    . Continuous Glucose Sensor (FREESTYLE LIBRE 3 PLUS SENSOR) MISC Use to check glucose continuously. Change sensor every 15 days. 2 each 11  . glucose blood (ONETOUCH VERIO) test strip Use to check blood sugar up to 2 times a day 100 each 5  . JARDIANCE  10 MG TABS tablet TAKE 1 TABLET BY MOUTH DAILY BEFORE BREAKFAST. 30 tablet 2  . Lancets (ONETOUCH DELICA PLUS LANCET30G) MISC Use to check blood sugar up to 2 times a day 100 each 5  . letrozole  (FEMARA ) 2.5 MG tablet TAKE 1 TABLET BY  MOUTH EVERY DAY 90 tablet 3  . levothyroxine  (SYNTHROID ) 88 MCG tablet TAKE 1 TABLET BY MOUTH EVERY DAY 90 tablet 0  . losartan  (COZAAR ) 100 MG tablet TAKE 1 TABLET BY MOUTH EVERY DAY 90 tablet 3  . NON FORMULARY Ginsing root    . Omega-3 Fatty Acids (FISH OIL OMEGA-3 PO) Take 1 capsule by mouth daily.    SABRA OVER THE COUNTER MEDICATION Liver gallbladder support tablets    . OVER THE COUNTER MEDICATION Leg and back pain relief tablets    . PREBIOTIC PRODUCT PO Take by mouth.    . rosuvastatin  (CRESTOR ) 20 MG tablet TAKE 1 TABLET BY MOUTH EVERY DAY 90 tablet 3  . Semaglutide , 2 MG/DOSE, (OZEMPIC , 2 MG/DOSE,) 8 MG/3ML SOPN Inject 2 mg into the skin once a week. Via Novo Nordisk PAP    . SODIUM FLUORIDE 5000 SENSITIVE 1.1-5 % GEL at bedtime.    . tiZANidine  (ZANAFLEX ) 4 MG tablet TAKE 1 TABLET BY  MOUTH AT BEDTIME AS NEEDED FOR MUSCLE SPASMS 90 tablet 0  . traMADol  (ULTRAM ) 50 MG tablet TAKE 1 TABLET BY MOUTH EVERY DAY AS NEEDED 30 tablet 0  . Turmeric (QC TUMERIC COMPLEX PO) Take 1 tablet by mouth daily.    . vitamin B-12 (CYANOCOBALAMIN ) 1000 MCG tablet Take 1,000 mcg by mouth daily.    . VITAMIN D  PO Take 2 each by mouth daily.    . bacitracin-neomycin -polymyxin-hydrocortisone (CORTISPORIN) 1 % ophthalmic ointment Apply one drop the toenail twice a day 3.5 g 0   No facility-administered medications prior to visit.     Per HPI unless specifically indicated in ROS section below Review of Systems  Constitutional:  Negative for fatigue and fever.  HENT:  Negative for congestion.   Eyes:  Negative for pain.  Respiratory:  Negative for cough and shortness of breath.   Cardiovascular:  Negative for chest pain, palpitations and leg swelling.  Gastrointestinal:  Negative for abdominal pain.  Genitourinary:  Negative for dysuria and vaginal bleeding.  Musculoskeletal:  Negative for back pain.  Neurological:  Negative for syncope, light-headedness and headaches.  Psychiatric/Behavioral:  Negative for dysphoric mood.    Objective:  BP 106/60   Pulse 68   Temp 98.4 F (36.9 C) (Temporal)   Ht 5' 1 (1.549 m)   Wt 124 lb 8 oz (56.5 kg)   SpO2 98%   BMI 23.52 kg/m   Wt Readings from Last 3 Encounters:  07/02/24 124 lb 8 oz (56.5 kg)  05/18/24 124 lb 8 oz (56.5 kg)  04/01/24 124 lb 9.6 oz (56.5 kg)      Physical Exam Constitutional:      General: She is not in acute distress.    Appearance: Normal appearance. She is well-developed. She is not ill-appearing or toxic-appearing.  HENT:     Head: Normocephalic.     Right Ear: Hearing, tympanic membrane, ear canal and external ear normal. Tympanic membrane is not erythematous, retracted or bulging.     Left Ear: Hearing, tympanic membrane, ear canal and external ear normal. Tympanic membrane is not erythematous, retracted or  bulging.     Nose: No mucosal edema or rhinorrhea.     Right Sinus: No maxillary sinus tenderness or frontal sinus tenderness.     Left Sinus: No maxillary sinus tenderness or frontal sinus tenderness.     Mouth/Throat:     Pharynx: Uvula midline.  Eyes:     General: Lids are normal. Lids are everted, no foreign bodies  appreciated.     Conjunctiva/sclera: Conjunctivae normal.     Pupils: Pupils are equal, round, and reactive to light.  Neck:     Thyroid : No thyroid  mass or thyromegaly.     Vascular: No carotid bruit.     Trachea: Trachea normal.  Cardiovascular:     Rate and Rhythm: Normal rate and regular rhythm.     Pulses: Normal pulses.     Heart sounds: Normal heart sounds, S1 normal and S2 normal. No murmur heard.    No friction rub. No gallop.  Pulmonary:     Effort: Pulmonary effort is normal. No tachypnea or respiratory distress.     Breath sounds: Normal breath sounds. No decreased breath sounds, wheezing, rhonchi or rales.  Chest:     Chest wall: No mass or tenderness.  Breasts:    Left: No swelling, bleeding, mass or nipple discharge.  Abdominal:     General: Bowel sounds are normal.     Palpations: Abdomen is soft.     Tenderness: There is no abdominal tenderness.  Musculoskeletal:     Cervical back: Normal, normal range of motion and neck supple. No bony tenderness. Normal range of motion.     Thoracic back: Bony tenderness present. No spasms or tenderness. Normal range of motion.  Skin:    General: Skin is warm and dry.     Findings: No Rundell.  Neurological:     Mental Status: She is alert.  Psychiatric:        Mood and Affect: Mood is not anxious or depressed.        Speech: Speech normal.        Behavior: Behavior normal. Behavior is cooperative.        Thought Content: Thought content normal.        Judgment: Judgment normal.       Diabetic foot exam:  See photo No area of fluctuance   Results for orders placed or performed in visit on 06/25/24   Basic Metabolic Panel (BMET)   Collection Time: 06/25/24  8:18 AM  Result Value Ref Range   Sodium 138 135 - 145 mEq/L   Potassium 4.0 3.5 - 5.1 mEq/L   Chloride 105 96 - 112 mEq/L   CO2 25 19 - 32 mEq/L   Glucose, Bld 146 (H) 70 - 99 mg/dL   BUN 29 (H) 6 - 23 mg/dL   Creatinine, Ser 8.72 (H) 0.40 - 1.20 mg/dL   GFR 58.57 (L) >39.99 mL/min   Calcium  10.3 8.4 - 10.5 mg/dL    Assessment and Plan  Type 2 diabetes mellitus with diabetic chronic kidney disease, unspecified CKD stage, unspecified whether long term insulin  use (HCC) -     POCT glycosylated hemoglobin (Hb A1C)  Diabetes mellitus treated with oral medication (HCC)  Hypertension associated with diabetes (HCC) Assessment & Plan: Stable, chronic.  Continue current medication.  Continue amlodipine  5 mg p.o. daily and losartan  100 mg p.o. daily   Type 2 diabetes mellitus with nephropathy (HCC) Assessment & Plan: Chronic, significant improvement with addition of renal dosed Jardiance  at 10 mg daily.  She continues on semaglutide  2 mg weekly.   Jardiance  10 mg p.o. daily Semaglutide  2 mg injected weekly.     No follow-ups on file.   Greig Ring, MD

## 2024-07-02 NOTE — Assessment & Plan Note (Signed)
 Chronic, significant improvement with addition of renal dosed Jardiance  at 10 mg daily.  She continues on semaglutide  2 mg weekly.   Jardiance  10 mg p.o. daily Semaglutide  2 mg injected weekly.

## 2024-07-14 ENCOUNTER — Encounter: Payer: Self-pay | Admitting: Pharmacist

## 2024-08-01 ENCOUNTER — Other Ambulatory Visit: Payer: Self-pay | Admitting: Family Medicine

## 2024-08-02 ENCOUNTER — Other Ambulatory Visit: Payer: Self-pay | Admitting: Family Medicine

## 2024-08-03 NOTE — Telephone Encounter (Signed)
 Last office visit 07/02/24 for DM.  Last refilled 04/09/24 for #30 with no refills.  Next Appt: CPE 12/31/2024

## 2024-08-11 ENCOUNTER — Inpatient Hospital Stay: Payer: PPO | Attending: Hematology and Oncology | Admitting: Hematology and Oncology

## 2024-08-11 VITALS — BP 118/62 | HR 83 | Temp 97.2°F | Resp 18 | Wt 122.4 lb

## 2024-08-11 DIAGNOSIS — Z170 Estrogen receptor positive status [ER+]: Secondary | ICD-10-CM

## 2024-08-11 DIAGNOSIS — Z79811 Long term (current) use of aromatase inhibitors: Secondary | ICD-10-CM | POA: Diagnosis not present

## 2024-08-11 DIAGNOSIS — M3500 Sicca syndrome, unspecified: Secondary | ICD-10-CM | POA: Diagnosis not present

## 2024-08-11 DIAGNOSIS — Z17411 Hormone receptor positive with human epidermal growth factor receptor 2 negative status: Secondary | ICD-10-CM | POA: Diagnosis not present

## 2024-08-11 DIAGNOSIS — C50211 Malignant neoplasm of upper-inner quadrant of right female breast: Secondary | ICD-10-CM | POA: Diagnosis not present

## 2024-08-11 DIAGNOSIS — R5383 Other fatigue: Secondary | ICD-10-CM | POA: Diagnosis not present

## 2024-08-11 NOTE — Progress Notes (Signed)
 Patient Care Team: Avelina Greig BRAVO, MD as PCP - General (Family Medicine) Myra Pac, DMD as Consulting Physician (Dentistry) Odean Potts, MD as Consulting Physician (Hematology and Oncology) Curvin Deward MOULD, MD as Consulting Physician (General Surgery) Dewey Rush, MD as Consulting Physician (Radiation Oncology) Marcey Elspeth PARAS, MD as Consulting Physician (Ophthalmology) Geronimo Manuelita SAUNDERS, [facility] as Pharmacist (Pharmacist)  DIAGNOSIS:  Encounter Diagnosis  Name Primary?   Carcinoma of upper-inner quadrant of right breast in female, estrogen receptor positive (HCC) Yes    SUMMARY OF ONCOLOGIC HISTORY: Oncology History  Carcinoma of upper-inner quadrant of right breast in female, estrogen receptor positive (HCC)  06/01/2021 Initial Diagnosis   Screening mammogram detected right breast cancer, biopsy invasive lobular cancer with LCIS grade 2, ER 90%, PR 10%, Ki-67 10%, HER2 negative ratio 1.15   07/24/2021 Surgery   Right lumpectomy: Grade 1 invasive lobular cancer 2.1 cm, margins negative, 2/11 lymph nodes positive, ER 90%, PR 10%, HER2 equivocal by IHC negative by FISH ratio 1.15, Ki-67 10% (Dr. Curvin: Surgeon)   07/24/2021 Cancer Staging   Staging form: Breast, AJCC 8th Edition - Pathologic stage from 07/24/2021: Stage IA (pT2, pN1a, cM0, G1, ER+, PR+, HER2-) - Signed by Crawford Morna Pickle, NP on 02/05/2022 Stage prefix: Initial diagnosis Histologic grading system: 3 grade system   09/20/2021 Oncotype testing   Oncotype DX recurrence score: 14: Distant recurrence at 9 years: 14%   09/28/2021 - 11/14/2021 Radiation Therapy   Site Technique Total Dose (Gy) Dose per Fx (Gy) Completed Fx Beam Energies  Breast, Right: Breast_R 3D 50.4/50.4 1.8 28/28 10XFFF  Breast, Right: Breast_R_SCLV 3D 50.4/50.4 1.8 28/28 6X, 10X  Breast, Right: Breast_R_Bst 3D 10/10 2 5/5 6X, 10X      11/06/2021 Cancer Staging   Staging form: Breast, AJCC 8th Edition - Clinical: Stage IIA (cT2, cN1, cM0,  G1, ER+, PR+, HER2-) - Signed by Odean Potts, MD on 11/06/2021 Stage prefix: Initial diagnosis Histologic grading system: 3 grade system   11/2021 -  Anti-estrogen oral therapy   Letrozole  daily     CHIEF COMPLIANT: Follow-up on letrozole  therapy  HISTORY OF PRESENT ILLNESS:  History of Present Illness April Ross is a 73 year old female who presents with persistent fatigue and sleep disturbances.  She experiences significant fatigue for several months, with a lack of energy making simple tasks exhausting. This fatigue has persisted for about four to five years and predates the use of Ozempic . She takes letrozole  and Ozempic , 2 mg every Sunday, with a weight loss of almost thirty pounds since starting it over a year ago.  Sleep disturbances include waking up after a few hours and difficulty returning to sleep, sometimes taking hours. This issue has been present for the last four to five years. Current medications include a muscle relaxer and tryptophan, taken at night to aid sleep. She drinks one to two cups of coffee in the morning and avoids caffeine and sodas in the afternoon and evening.     ALLERGIES:  is allergic to acetaminophen , asa [aspirin ], ibuprofen, and other.  MEDICATIONS:  Current Outpatient Medications  Medication Sig Dispense Refill   Alcohol Swabs ([facility] PREP) 70 % PADS USE TO TEST BLOOD SUGAR UP TO 2 TIMES A DAY 100 each 5   alendronate  (FOSAMAX ) 70 MG tablet TAKE 1 TABLET (70 MG TOTAL) BY MOUTH EVERY 7 DAYS WITH FULL GLASS WATER ON EMPTY STOMACH 12 tablet 3   amLODipine  (NORVASC ) 5 MG tablet TAKE 1 TABLET (5 MG TOTAL)  BY MOUTH DAILY. 90 tablet 3   APPLE CIDER VINEGAR PO Take by mouth.     Blood Glucose Monitoring Suppl (ONETOUCH VERIO REFLECT) w/Device KIT Use to check blood sugar up to 2 times a day 1 kit 0   cetirizine (ZYRTEC) 10 MG tablet Take 10 mg by mouth at bedtime as needed for allergies.     CINNAMON PO Take 1 tablet by mouth daily.     Continuous  Glucose Sensor (FREESTYLE LIBRE 3 PLUS SENSOR) MISC Use to check glucose continuously. Change sensor every 15 days. 2 each 11   empagliflozin  (JARDIANCE ) 10 MG TABS tablet TAKE 1 TABLET BY MOUTH DAILY BEFORE BREAKFAST. 30 tablet 5   glucose blood (ONETOUCH VERIO) test strip Use to check blood sugar up to 2 times a day 100 each 5   Lancets (ONETOUCH DELICA PLUS LANCET30G) MISC Use to check blood sugar up to 2 times a day 100 each 5   letrozole  (FEMARA ) 2.5 MG tablet TAKE 1 TABLET BY MOUTH EVERY DAY 90 tablet 3   levothyroxine  (SYNTHROID ) 88 MCG tablet TAKE 1 TABLET BY MOUTH EVERY DAY 90 tablet 0   losartan  (COZAAR ) 100 MG tablet TAKE 1 TABLET BY MOUTH EVERY DAY 90 tablet 3   NON FORMULARY Ginsing root     Omega-3 Fatty Acids (FISH OIL OMEGA-3 PO) Take 1 capsule by mouth daily.     OVER THE COUNTER MEDICATION Liver gallbladder support tablets     OVER THE COUNTER MEDICATION Leg and back pain relief tablets     PREBIOTIC PRODUCT PO Take by mouth.     rosuvastatin  (CRESTOR ) 20 MG tablet TAKE 1 TABLET BY MOUTH EVERY DAY 90 tablet 3   Semaglutide , 2 MG/DOSE, (OZEMPIC , 2 MG/DOSE,) 8 MG/3ML SOPN Inject 2 mg into the skin once a week. Via Novo Nordisk PAP     SODIUM FLUORIDE 5000 SENSITIVE 1.1-5 % GEL at bedtime.     tiZANidine  (ZANAFLEX ) 4 MG tablet TAKE 1 TABLET BY MOUTH AT BEDTIME AS NEEDED FOR MUSCLE SPASMS 90 tablet 0   traMADol  (ULTRAM ) 50 MG tablet TAKE 1 TABLET BY MOUTH EVERY DAY AS NEEDED 30 tablet 0   Turmeric (QC TUMERIC COMPLEX PO) Take 1 tablet by mouth daily.     vitamin B-12 (CYANOCOBALAMIN ) 1000 MCG tablet Take 1,000 mcg by mouth daily.     VITAMIN D  PO Take 2 each by mouth daily.     No current facility-administered medications for this visit.    PHYSICAL EXAMINATION: ECOG PERFORMANCE STATUS: 1 - Symptomatic but completely ambulatory  Vitals:   08/11/24 1055  BP: 118/62  Pulse: 83  Resp: 18  Temp: (!) 97.2 F (36.2 C)  SpO2: 99%   Filed Weights   08/11/24 1055  Weight:  122 lb 6 oz (55.5 kg)     LABORATORY DATA:  I have reviewed the data as listed    Latest Ref Rng & Units 06/25/2024    8:18 AM 03/12/2024    9:27 AM 12/18/2023    8:26 AM  CMP  Glucose 70 - 99 mg/dL 853  884  817   BUN 6 - 23 mg/dL 29  20  21    Creatinine 0.40 - 1.20 mg/dL 8.72  8.71  8.76   Sodium 135 - 145 mEq/L 138  141  139   Potassium 3.5 - 5.1 mEq/L 4.0  4.4  4.2   Chloride 96 - 112 mEq/L 105  104  105   CO2 19 - 32 mEq/L  25  19  27    Calcium  8.4 - 10.5 mg/dL 89.6  9.7  9.9   Total Protein 6.0 - 8.3 g/dL   6.8   Total Bilirubin 0.2 - 1.2 mg/dL   0.6   Alkaline Phos 39 - 117 U/L   43   AST 0 - 37 U/L   26   ALT 0 - 35 U/L   40     Lab Results  Component Value Date   WBC 15.0 (H) 10/20/2022   HGB 13.8 10/20/2022   HCT 40.6 10/20/2022   MCV 84.9 10/20/2022   PLT 224 10/20/2022   NEUTROABS 11.9 (H) 10/20/2022    ASSESSMENT & PLAN:  Carcinoma of upper-inner quadrant of right breast in female, estrogen receptor positive (HCC) 06/01/2021: Screening mammogram detected right breast cancer invasive lobular cancer with LCIS grade 2 ER/PR positive HER2 negative with a Ki-67 10% 07/24/2021: Right lumpectomy: Grade 1 invasive lobular cancer 2.1 cm, margins negative, 2/11 lymph nodes positive, ER 90%, PR 10%, HER2 equivocal by IHC negative by FISH ratio 1.15, Ki-67 10% (Dr. Curvin: Surgeon) 09/08/2021-11/14/2021: Adjuvant radiation   Treatment plan: Adjuvant antiestrogen therapy with letrozole  2.5 mg daily x7 years started 11/28/2021    Letrozole  toxicities:  Severe fatigue: Unclear etiology.  I discussed with her about holding letrozole  for 3 weeks.  If her imaging does improve then we will consider switching her to another aromatase inhibitor therapy.  Patient lost 30 pounds on Ozempic  and is doing quite well.  She feels so much better.   Breast cancer surveillance: Breast exam: 08/11/2024: Benign Mammogram 06/27/2023: Benign breast density category C   Telephone call in 3 weeks to  assess her energy levels after stopping letrozole . ------------------------------------- Assessment and Plan Assessment & Plan Estrogen receptor positive carcinoma of upper-inner quadrant of right breast Letrozole  has been used for almost three years. Fatigue may be a side effect. The medicine benefit lasts for another five plus years.  - Discontinue letrozole  for 2-3 weeks to assess impact on fatigue. - Consider anastrozole or exemestane if letrozole  contributes to fatigue. - Reassess in 3-4 weeks.  Fatigue Fatigue may be related to letrozole . Ozempic  was started a year ago, after the onset of fatigue. - Discontinue letrozole  for 2-3 weeks. - Reassess fatigue post-letrozole  discontinuation.  Insomnia Insomnia present for 4-5 years with difficulty maintaining sleep. Current sleep hygiene mostly appropriate. - Maintain consistent sleep schedule. - Avoid eating within two hours of bedtime. - Limit TV before bed. - Use guided imagery techniques like Insight Timer app.      No orders of the defined types were placed in this encounter.  The patient has a good understanding of the overall plan. she agrees with it. she will call with any problems that may develop before the next visit here.  I personally spent a total of 30 minutes in the care of the patient today including preparing to see the patient, getting/reviewing separately obtained history, performing a medically appropriate exam/evaluation, counseling and educating, placing orders, referring and communicating with other health care professionals, documenting clinical information in the EHR, independently interpreting results, communicating results, and coordinating care.   Viinay K Dameon Soltis, MD 08/11/24

## 2024-08-11 NOTE — Assessment & Plan Note (Signed)
 06/01/2021: Screening mammogram detected right breast cancer invasive lobular cancer with LCIS grade 2 ER/PR positive HER2 negative with a Ki-67 10% 07/24/2021: Right lumpectomy: Grade 1 invasive lobular cancer 2.1 cm, margins negative, 2/11 lymph nodes positive, ER 90%, PR 10%, HER2 equivocal by IHC negative by FISH ratio 1.15, Ki-67 10% (Dr. Curvin: Surgeon) 09/08/2021-11/14/2021: Adjuvant radiation   Treatment plan: Adjuvant antiestrogen therapy with letrozole  2.5 mg daily x7 years started 11/28/2021    Letrozole  toxicities: Denies any major adverse effects Patient lost 10 pounds on Ozempic  and is doing quite well.  She feels so much better.   Breast cancer surveillance: Breast exam: 08/11/2024: Benign Mammogram 06/27/2023: Benign breast density category C   Return to clinic in 1 year for follow-up

## 2024-08-12 ENCOUNTER — Telehealth: Payer: Self-pay | Admitting: Hematology and Oncology

## 2024-08-31 ENCOUNTER — Inpatient Hospital Stay: Admitting: Hematology and Oncology

## 2024-08-31 DIAGNOSIS — C50211 Malignant neoplasm of upper-inner quadrant of right female breast: Secondary | ICD-10-CM

## 2024-08-31 DIAGNOSIS — Z170 Estrogen receptor positive status [ER+]: Secondary | ICD-10-CM

## 2024-08-31 NOTE — Assessment & Plan Note (Signed)
 06/01/2021: Screening mammogram detected right breast cancer invasive lobular cancer with LCIS grade 2 ER/PR positive HER2 negative with a Ki-67 10% 07/24/2021: Right lumpectomy: Grade 1 invasive lobular cancer 2.1 cm, margins negative, 2/11 lymph nodes positive, ER 90%, PR 10%, HER2 equivocal by IHC negative by FISH ratio 1.15, Ki-67 10% (Dr. Curvin: Surgeon) 09/08/2021-11/14/2021: Adjuvant radiation   Treatment plan: Adjuvant antiestrogen therapy with letrozole  2.5 mg daily x7 years started 11/28/2021    Letrozole  toxicities:  Severe fatigue: Unclear etiology.  I discussed with her about holding letrozole  for 3 weeks.  If her symptoms do improve then we will consider switching her to another aromatase inhibitor therapy.   Patient lost 30 pounds on Ozempic  and is doing quite well.  She feels so much better.   Breast cancer surveillance: Breast exam: 08/31/2024: Benign Mammogram 06/27/2023: Benign breast density category C.  New mammogram will need to be obtained.   Telephone call in 3 weeks to assess her energy levels after stopping letrozole .

## 2024-08-31 NOTE — Progress Notes (Signed)
 HEMATOLOGY-ONCOLOGY TELEPHONE VISIT PROGRESS NOTE  I connected with our patient on 08/31/24 at  9:00 AM EST by telephone and verified that I am speaking with the correct person using two identifiers.  I discussed the limitations, risks, security and privacy concerns of performing an evaluation and management service by telephone and the availability of in person appointments.  I also discussed with the patient that there may be a patient responsible charge related to this service. The patient expressed understanding and agreed to proceed.   History of Present Illness:    History of Present Illness April Ross is a 73 year old female with Sjogren's syndrome who presents with fatigue.  She experiences significant fatigue, impacting her ability to perform daily activities such as vacuuming. She recently discontinued letrozole  for a few weeks without any change in fatigue levels. She lives alone with her dog and finds household chores particularly tiring.    Oncology History  Carcinoma of upper-inner quadrant of right breast in female, estrogen receptor positive (HCC)  06/01/2021 Initial Diagnosis   Screening mammogram detected right breast cancer, biopsy invasive lobular cancer with LCIS grade 2, ER 90%, PR 10%, Ki-67 10%, HER2 negative ratio 1.15   07/24/2021 Surgery   Right lumpectomy: Grade 1 invasive lobular cancer 2.1 cm, margins negative, 2/11 lymph nodes positive, ER 90%, PR 10%, HER2 equivocal by IHC negative by FISH ratio 1.15, Ki-67 10% (Dr. Curvin: Surgeon)   07/24/2021 Cancer Staging   Staging form: Breast, AJCC 8th Edition - Pathologic stage from 07/24/2021: Stage IA (pT2, pN1a, cM0, G1, ER+, PR+, HER2-) - Signed by Crawford Morna Pickle, NP on 02/05/2022 Stage prefix: Initial diagnosis Histologic grading system: 3 grade system   09/20/2021 Oncotype testing   Oncotype DX recurrence score: 14: Distant recurrence at 9 years: 14%   09/28/2021 - 11/14/2021 Radiation Therapy    Site Technique Total Dose (Gy) Dose per Fx (Gy) Completed Fx Beam Energies  Breast, Right: Breast_R 3D 50.4/50.4 1.8 28/28 10XFFF  Breast, Right: Breast_R_SCLV 3D 50.4/50.4 1.8 28/28 6X, 10X  Breast, Right: Breast_R_Bst 3D 10/10 2 5/5 6X, 10X      11/06/2021 Cancer Staging   Staging form: Breast, AJCC 8th Edition - Clinical: Stage IIA (cT2, cN1, cM0, G1, ER+, PR+, HER2-) - Signed by Odean Potts, MD on 11/06/2021 Stage prefix: Initial diagnosis Histologic grading system: 3 grade system   11/2021 -  Anti-estrogen oral therapy   Letrozole  daily     REVIEW OF SYSTEMS:   Constitutional: Denies fevers, chills or abnormal weight loss All other systems were reviewed with the patient and are negative. Observations/Objective:     Assessment Plan:  Carcinoma of upper-inner quadrant of right breast in female, estrogen receptor positive (HCC) 06/01/2021: Screening mammogram detected right breast cancer invasive lobular cancer with LCIS grade 2 ER/PR positive HER2 negative with a Ki-67 10% 07/24/2021: Right lumpectomy: Grade 1 invasive lobular cancer 2.1 cm, margins negative, 2/11 lymph nodes positive, ER 90%, PR 10%, HER2 equivocal by IHC negative by FISH ratio 1.15, Ki-67 10% (Dr. Curvin: Surgeon) 09/08/2021-11/14/2021: Adjuvant radiation   Treatment plan: Adjuvant antiestrogen therapy with letrozole  2.5 mg daily x7 years started 11/28/2021    Letrozole  toxicities:  Severe fatigue: Unclear etiology.   We held letrozole  for 3 weeks and it did not make a difference.  Therefore she will resume the letrozole  at this time. She will discuss with her primary care doctor about making sure there is no other reason for the fatigue other than possibly related to  Sjogren's I asked her to check a B12 level and vitamin D  levels with her next blood work as well.   Patient lost 30 pounds on Ozempic  and is doing quite well.  She feels so much better.   Breast cancer surveillance: Breast exam: 08/31/2024:  Benign Mammogram 06/27/2023: Benign breast density category C.  New mammogram will need to be obtained.   I sent a new order to the breast center. Return to clinic in 1 year for follow-up   I discussed the assessment and treatment plan with the patient. The patient was provided an opportunity to ask questions and all were answered. The patient agreed with the plan and demonstrated an understanding of the instructions. The patient was advised to call back or seek an in-person evaluation if the symptoms worsen or if the condition fails to improve as anticipated.   I provided 20 minutes of non-face-to-face time during this encounter.  This includes time for charting and coordination of care   April MARLA Chad, MD

## 2024-09-09 ENCOUNTER — Other Ambulatory Visit: Payer: Self-pay | Admitting: Family Medicine

## 2024-09-09 DIAGNOSIS — E1121 Type 2 diabetes mellitus with diabetic nephropathy: Secondary | ICD-10-CM

## 2024-09-16 ENCOUNTER — Other Ambulatory Visit: Payer: Self-pay | Admitting: Family Medicine

## 2024-09-16 NOTE — Telephone Encounter (Signed)
 Last office visit 07/02/24 for DM.  Last refilled 04/09/2024 for #90 with no refills.  Next Appt: CPE 12/31/2024.

## 2024-09-25 ENCOUNTER — Other Ambulatory Visit: Payer: Self-pay | Admitting: Family Medicine

## 2024-09-25 DIAGNOSIS — E1169 Type 2 diabetes mellitus with other specified complication: Secondary | ICD-10-CM

## 2024-09-25 DIAGNOSIS — E1121 Type 2 diabetes mellitus with diabetic nephropathy: Secondary | ICD-10-CM

## 2024-09-25 DIAGNOSIS — E039 Hypothyroidism, unspecified: Secondary | ICD-10-CM

## 2024-10-02 ENCOUNTER — Telehealth: Payer: Self-pay

## 2024-10-02 DIAGNOSIS — E1121 Type 2 diabetes mellitus with diabetic nephropathy: Secondary | ICD-10-CM

## 2024-10-02 NOTE — Telephone Encounter (Signed)
 Copied from CRM #[id number]. Topic: Clinical - Prescription Issue >> Oct 02, 2024  4:12 PM Rea C wrote: Reason for CRM: Ozempic  2mg    Patient was getting ozempic  free from the manufacturer. They are no longer supplying it and patient needs Dr. Avelina to write a prescription. Patient only has one left that she will take on Sunday. Patient needs a new prescription written out.   . [id number] - [place], [place] - [address] [facility] [address] [place] [address] Phone: [phone] Fax: [phone] Hours: Not open 24 hours

## 2024-10-03 ENCOUNTER — Other Ambulatory Visit: Payer: Self-pay | Admitting: Family Medicine

## 2024-10-05 MED ORDER — OZEMPIC (2 MG/DOSE) 8 MG/3ML ~~LOC~~ SOPN: 2.0000 mg | PEN_INJECTOR | SUBCUTANEOUS | 2 refills | Status: AC

## 2024-10-05 NOTE — Telephone Encounter (Signed)
 Last office visit 07/02/2024 for DM.  Last refilled 08/04/2024 for #30 with no refills.  Next Appt: CPE 12/31/2024

## 2024-10-05 NOTE — Telephone Encounter (Signed)
 [facility] to [facility] [place] as requested.

## 2024-10-05 NOTE — Addendum Note (Signed)
 Addended by: WENDELL ARLAND RAMAN on: 10/05/2024 04:25 PM   Modules accepted: Orders

## 2024-10-06 ENCOUNTER — Other Ambulatory Visit (HOSPITAL_COMMUNITY): Payer: Self-pay

## 2024-10-06 ENCOUNTER — Telehealth: Payer: Self-pay

## 2024-10-06 NOTE — Telephone Encounter (Signed)
 Pharmacy Patient Advocate Encounter   Received notification from Onbase that prior authorization for Ozempic  8 is required/requested.   Insurance verification completed.   The patient is insured through [facility] [employer]/RX [employer].   Per test claim: PA required; PA submitted to above mentioned insurance via Latent Key/confirmation #/EOC [id number] Status is pending

## 2024-10-06 NOTE — Telephone Encounter (Signed)
 Pharmacy Patient Advocate Encounter  Received notification from [employer] that Prior Authorization for Ozempic  8 has been APPROVED from 10/06/24 to 10/06/25. Unable to obtain price due to refill too soon rejection, last fill date 10/06/24 next available fill date1/20/26   PA #/Case ID/Reference #: # [id number]

## 2024-10-06 NOTE — Telephone Encounter (Signed)
 Pharmacy Patient Advocate Encounter  Received notification from [facility] ADVANTAGE/RX ADVANCE that Prior Authorization for Ozempic  (2 mg/DOSE) 8 mg/3 ml pens has been APPROVED from 10/06/24 to 10/06/25. Ran test claim, Copay is $0. This test claim was processed through [facility]- copay amounts may vary at other pharmacies due to pharmacy/plan contracts, or as the patient moves through the different stages of their insurance plan.   PA #/Case ID/Reference #: [id number]

## 2024-10-13 ENCOUNTER — Other Ambulatory Visit: Payer: Self-pay | Admitting: Hematology and Oncology

## 2024-10-20 ENCOUNTER — Other Ambulatory Visit: Payer: Self-pay | Admitting: Family Medicine

## 2024-10-20 DIAGNOSIS — E1121 Type 2 diabetes mellitus with diabetic nephropathy: Secondary | ICD-10-CM

## 2024-10-30 LAB — OPHTHALMOLOGY REPORT-SCANNED

## 2024-11-16 ENCOUNTER — Ambulatory Visit: Attending: General Surgery

## 2024-12-23 ENCOUNTER — Ambulatory Visit

## 2024-12-24 ENCOUNTER — Ambulatory Visit

## 2024-12-24 ENCOUNTER — Other Ambulatory Visit

## 2024-12-25 ENCOUNTER — Ambulatory Visit

## 2024-12-31 ENCOUNTER — Encounter: Admitting: Family Medicine

## 2025-08-31 ENCOUNTER — Inpatient Hospital Stay: Admitting: Hematology and Oncology
